# Patient Record
Sex: Female | Born: 1940 | Race: White | Hispanic: No | Marital: Married | Smoking: Never smoker
Health system: Southern US, Community
[De-identification: ages and names within clinical notes are randomized; demographics above are authoritative.]

## PROBLEM LIST (undated history)

## (undated) DIAGNOSIS — R112 Nausea with vomiting, unspecified: Secondary | ICD-10-CM

## (undated) DIAGNOSIS — Z86711 Personal history of pulmonary embolism: Secondary | ICD-10-CM

## (undated) DIAGNOSIS — D509 Iron deficiency anemia, unspecified: Secondary | ICD-10-CM

## (undated) DIAGNOSIS — I10 Essential (primary) hypertension: Secondary | ICD-10-CM

## (undated) DIAGNOSIS — D649 Anemia, unspecified: Secondary | ICD-10-CM

## (undated) DIAGNOSIS — K449 Diaphragmatic hernia without obstruction or gangrene: Secondary | ICD-10-CM

## (undated) DIAGNOSIS — K5790 Diverticulosis of intestine, part unspecified, without perforation or abscess without bleeding: Secondary | ICD-10-CM

## (undated) DIAGNOSIS — E7800 Pure hypercholesterolemia, unspecified: Secondary | ICD-10-CM

## (undated) DIAGNOSIS — M797 Fibromyalgia: Secondary | ICD-10-CM

## (undated) DIAGNOSIS — E559 Vitamin D deficiency, unspecified: Secondary | ICD-10-CM

## (undated) DIAGNOSIS — Z79810 Long term (current) use of selective estrogen receptor modulators (SERMs): Secondary | ICD-10-CM

## (undated) DIAGNOSIS — K219 Gastro-esophageal reflux disease without esophagitis: Secondary | ICD-10-CM

## (undated) DIAGNOSIS — T4145XA Adverse effect of unspecified anesthetic, initial encounter: Secondary | ICD-10-CM

## (undated) DIAGNOSIS — Z973 Presence of spectacles and contact lenses: Secondary | ICD-10-CM

## (undated) DIAGNOSIS — R0600 Dyspnea, unspecified: Secondary | ICD-10-CM

## (undated) DIAGNOSIS — C50919 Malignant neoplasm of unspecified site of unspecified female breast: Secondary | ICD-10-CM

## (undated) DIAGNOSIS — Z9889 Other specified postprocedural states: Secondary | ICD-10-CM

## (undated) DIAGNOSIS — Z923 Personal history of irradiation: Secondary | ICD-10-CM

## (undated) HISTORY — PX: RECONSTRUCTION OF NOSE: SHX2301

## (undated) HISTORY — DX: Fibromyalgia: M79.7

## (undated) HISTORY — PX: OTHER SURGICAL HISTORY: SHX169

## (undated) HISTORY — PX: TUBAL LIGATION: SHX77

## (undated) HISTORY — PX: APPENDECTOMY: SHX54

## (undated) HISTORY — DX: Vitamin D deficiency, unspecified: E55.9

## (undated) HISTORY — DX: Pure hypercholesterolemia, unspecified: E78.00

## (undated) HISTORY — PX: TONSILLECTOMY: SUR1361

## (undated) HISTORY — DX: Diaphragmatic hernia without obstruction or gangrene: K44.9

## (undated) HISTORY — DX: Malignant neoplasm of unspecified site of unspecified female breast: C50.919

## (undated) HISTORY — DX: Anemia, unspecified: D64.9

## (undated) HISTORY — PX: AUGMENTATION MAMMAPLASTY: SUR837

## (undated) HISTORY — DX: Diverticulosis of intestine, part unspecified, without perforation or abscess without bleeding: K57.90

---

## 1993-06-21 HISTORY — PX: LIPOSUCTION: SHX10

## 2000-08-01 ENCOUNTER — Other Ambulatory Visit: Admission: RE | Admit: 2000-08-01 | Discharge: 2000-08-01 | Payer: Self-pay | Admitting: Obstetrics and Gynecology

## 2001-10-10 ENCOUNTER — Other Ambulatory Visit: Admission: RE | Admit: 2001-10-10 | Discharge: 2001-10-10 | Payer: Self-pay | Admitting: Obstetrics and Gynecology

## 2005-02-15 ENCOUNTER — Ambulatory Visit: Payer: Self-pay | Admitting: Internal Medicine

## 2005-10-20 ENCOUNTER — Emergency Department (HOSPITAL_COMMUNITY): Admission: EM | Admit: 2005-10-20 | Discharge: 2005-10-21 | Payer: Self-pay | Admitting: Emergency Medicine

## 2005-10-24 ENCOUNTER — Inpatient Hospital Stay (HOSPITAL_COMMUNITY): Admission: EM | Admit: 2005-10-24 | Discharge: 2005-10-27 | Payer: Self-pay | Admitting: Emergency Medicine

## 2007-06-22 DIAGNOSIS — T8859XA Other complications of anesthesia, initial encounter: Secondary | ICD-10-CM

## 2007-06-22 HISTORY — DX: Other complications of anesthesia, initial encounter: T88.59XA

## 2007-06-22 HISTORY — PX: FACIAL COSMETIC SURGERY: SHX629

## 2007-12-27 ENCOUNTER — Emergency Department (HOSPITAL_COMMUNITY): Admission: EM | Admit: 2007-12-27 | Discharge: 2007-12-28 | Payer: Self-pay | Admitting: Emergency Medicine

## 2010-11-06 NOTE — Discharge Summary (Signed)
[name], [name]            ACCOUNT NO.:  [id number]   MEDICAL RECORD NO.:  [id number]          PATIENT TYPE:  INP   LOCATION:  4741                         FACILITY:  [facility]   PHYSICIAN:  Hillery Aldo, M.D.   DATE OF BIRTH:  09/15/1940   DATE OF ADMISSION:  10/24/2005  DATE OF DISCHARGE:  10/27/2005                                 DISCHARGE SUMMARY   PRIMARY CARE PHYSICIAN:  Dr. Artis Flock.   PSYCHIATRIST:  Dr. Jacqulyn Bath at [facility].   DISCHARGE DIAGNOSES:  1.  Psychotic disorder not otherwise specified.  2.  Major depressive disorder, single episode, severe with psychotic      features.  3.  Hypertension.  4.  Urinary tract infection.  5.  Insomnia.   DISCHARGE MEDICATIONS:  1.  Lexapro 10 mg daily.  2.  Seroquel 50 mg q.h.s.  3.  Micardis 40 mg daily.   CONSULTATIONS:  Dr. Jeanie Sewer of psychiatry.   BRIEF ADMISSION HPI:  The patient is a 70 year old female who presented to  the emergency department with abnormal behavior including auditory  hallucinations as well as paranoid delusions.  She has been under some  stress lately.  She recently saw her primary care physician who put her on  trazodone for insomnia and Zoloft for depression.  She then developed some  pressured speech alternating between speaking to fast and then not answering  questions.  Her attention has been poor.  There has been some anxiety.  She  was admitted for further evaluation and workup.   PROCEDURES AND DIAGNOSTIC STUDIES:  1.  Chest x-ray on Oct 24, 2005 showed no acute cardiopulmonary disease.  2.  MRI of the brain on Oct 24, 2005 showed mild chronic small vessel changes      with no evidence of acute or reversible process.  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  The radiologist did feel that it was insignificant      lesion and did not  recommend any further evaluation.   DISCHARGE LABORATORY VALUES:  Urine cultures are pending.  Sodium is 142,  potassium 3.6, chloride 108, bicarb 29, BUN 7, creatinine 0.8, glucose 98.  White blood cell count was 6, hemoglobin 13.5, hematocrit 39.9, platelets  212.  TSH was 1.567.  Urine drug screen and alcohol screen were negative.   HOSPITAL COURSE:  Problem 1:  PSYCHOSIS:  The patient was admitted and a  workup was undertaken.  Psychiatry was consulted and the patient was  evaluated by Dr. Jeanie Sewer who initiated treatment with Seroquel and  Lexapro.  The patient seemed to do well on this medication and slept well  after it was started.  Twenty-four hours after the medication was started,  she had decreased and delusional thoughts, decreased anxiety and improve  sleep.  Her thought processes were deemed to be less fragmented.  She will  be discharged on Seroquel and Lexapro and an outpatient  appointment has been  made for her to follow up with Dr. Jacqulyn Bath of [facility] at  1:35 on Nov 04, 2005.   Problem 2:  HYPERTENSION:  The patient's blood pressure remained well-  controlled throughout the course of her hospitalization on her usual home  dose of Micardis.   Problem 3:  URINARY TRACT INFECTION:  The patient did have evidence of a  urinary tract infection with complaints of dysuria.  Her urinalysis did show  pyuria and some bacteriuria.  She was treated with Rocephin.  No further  treatment is necessary unless she redevelops symptoms of dysuria.   DISPOSITION:  The patient is stable for discharge home.  She will follow up  with her primary care physician, Dr. Artis Flock, in 1-2 weeks.  She will follow  up with Dr. Jacqulyn Bath of psychiatry for medication management on Nov 04, 2005 as  previously described.           ______________________________  Hillery Aldo, M.D.     CR/MEDQ  D:  10/27/2005  T:  10/28/2005  Job:  [id number]   cc:   Quita Skye. Artis Flock, M.D.  Fax:  [phone]   Long, M.D.  [facility]

## 2010-11-06 NOTE — H&P (Signed)
[name], [name]            ACCOUNT NO.:  [id number]   MEDICAL RECORD NO.:  [id number]          PATIENT TYPE:  INP   LOCATION:  4741                         FACILITY:  [facility]   PHYSICIAN:  Renato Battles, M.D.     DATE OF BIRTH:  [birth year]   DATE OF ADMISSION:  10/24/2005  DATE OF DISCHARGE:                                HISTORY & PHYSICAL   REASON FOR ADMISSION:  Abnormal behavior.   HISTORY OF PRESENT ILLNESS:  The patient is a pleasant 70 year old white  female who woke up this morning feeling that her blood was on fire after 2  hours of sleep, she was also reporting hearing things and feelings things  that sounded strange to her husband who got worried and brought her to the  emergency department for evaluation and management.  She was in the  emergency department for some similar complaints 3 days ago.  At that time  she was thought to be dehydrated, given some IV fluids and discharged.  She  went and saw her primary care physician, Dr. Artis Flock and was started on  trazodone and Zoloft by him.  Her husband reports that she has had multiple  symptoms mostly related to strange behavior including switching back and  forth between talking too fast and not answering questions, hearing things  and talking off the wall.  The patient initially was not answering  questions in the interview then she started talking changing pace quickly  between subjects but she was answering questions appropriately.  She  expressed excessive anxiety over losing her jobs given that is their source  of income for the family and her inability to sleep and her concern that any  psychiatric evaluation would affect her chances of keeping her job.   REVIEW OF SYSTEMS:  CONSTITUTIONAL SYSTEMS:  No fevers, chills or night  sweats.  No weight changes.  GI:  No nausea or vomiting.  Positive for  diarrhea.  GU:  No dysuria or hematuria or retention.  CARDIOPULMONARY:  No  chest pain, no shortness of breath or  orthopnea, no PND.  NEUROLOGICAL:  Positive for throbbing headache in the back of neck and insomnia.   PAST MEDICAL HISTORY:  Hypertension.   PAST SURGICAL HISTORY:  1.  Chin implant.  2.  Breast implant.  3.  Appendectomy.  4.  Tubal ligation.  5.  Liposuction for the arms and abdomen.  6.  Nose reconstruction as a result of traumatic accident.   FAMILY HISTORY:  Noncontributory.   SOCIAL HISTORY:  No tobacco, alcohol or drugs.  She lives with her husband  who is disabled.   DRUG ALLERGIES:  NO KNOWN DRUG ALLERGIES.   HOME MEDICATIONS:  1.  Ambien CR unknown dose.  2.  Micardis 40 mg p.o. daily.  3.  Zoloft 50 mg p.o. daily.  4.  Trazodone 50 mg p.o. q.h.s.   PHYSICAL EXAMINATION:  GENERAL:  The patient is alert and oriented x3,  although she was slow to answer and at times she was inappropriate and  seemed to be attempting to be evasive.  VITALS:  Temperature 98.6,  heart rate 92 down from 103 when she first came  to the ER, respiratory rate 18 and blood pressure 157/65, O2 saturation 95%.  HEENT:  The head is atraumatic, normocephalic.  Pupils were a little dilated  but they are round and reactive to light and accommodation.  Extraocular  movements intact bilaterally.  NECK:  No lymphadenopathy, no thyromegaly, no JVD.  CHEST:  Clear to auscultation bilaterally.  No wheezing, rales or rhonchi.  HEART:  Regular rhythm, tachycardia.  No murmurs.  ABDOMEN:  Soft, not tender, not distended, normoactive bowel sounds.  EXTREMITIES:  No cyanosis, edema or clubbing.  NEUROLOGIC:  Neurologic exam was grossly intact with no hyperreflexia or  hypertonia.   IMPRESSION AND PLAN:  1.  Abnormal behavior.  At this time the patient's behavior is suggestive of      multiple different processes; bipolar disease versus depression,      psychosis and even possibly serotonin syndrome.  Going to admit the      patient for further evaluation and management which was going to be      mostly  supportive with intravenous fluids and Ativan.  Psychiatric      consult will be obtained.  Zoloft and trazodone will be stopped.  Drug      abuse will be ruled out with urine drug screen.  TSH will be checked to      rule out hyperthyroidism.  2.  Hypertension.  Continue with Micardis.      Renato Battles, M.D.  Electronically Signed     SA/MEDQ  D:  10/24/2005  T:  10/24/2005  Job:  [id number]   cc:   Quita Skye. Artis Flock, M.D.  Fax: [phone]

## 2011-03-18 LAB — DIFFERENTIAL
Basophils Absolute: 0.1
Basophils Relative: 1
Eosinophils Relative: 0
Lymphocytes Relative: 6 — ABNORMAL LOW
Lymphs Abs: 0.5 — ABNORMAL LOW
Monocytes Relative: 1 — ABNORMAL LOW
Neutro Abs: 6.8
Neutrophils Relative %: 92 — ABNORMAL HIGH

## 2011-03-18 LAB — URINE MICROSCOPIC-ADD ON

## 2011-03-18 LAB — COMPREHENSIVE METABOLIC PANEL
ALT: 14
AST: 20
GFR calc Af Amer: >60
GFR calc non Af Amer: >60
Glucose, Bld: 164 — ABNORMAL HIGH
Potassium: 3.8
Total Bilirubin: 1.3 — ABNORMAL HIGH
Total Protein: 6.3

## 2011-03-18 LAB — RAPID URINE DRUG SCREEN, HOSP PERFORMED
Benzodiazepines: NOT DETECTED
Opiates: NOT DETECTED

## 2011-03-18 LAB — URINALYSIS, ROUTINE W REFLEX MICROSCOPIC
Protein, ur: NEGATIVE
Specific Gravity, Urine: 1.022

## 2011-03-18 LAB — TRICYCLICS SCREEN, URINE: TCA Scrn: NOT DETECTED

## 2011-03-18 LAB — CBC
MCHC: 34.3
MCV: 88.2
RBC: 5.22 — ABNORMAL HIGH
RDW: 13.3

## 2011-03-18 LAB — ACETAMINOPHEN LEVEL: Acetaminophen (Tylenol), Serum: <10 — ABNORMAL LOW

## 2013-06-11 ENCOUNTER — Encounter: Payer: Self-pay | Admitting: *Deleted

## 2013-06-25 ENCOUNTER — Encounter: Payer: Self-pay | Admitting: Internal Medicine

## 2013-06-25 ENCOUNTER — Other Ambulatory Visit (INDEPENDENT_AMBULATORY_CARE_PROVIDER_SITE_OTHER): Payer: Medicare Other

## 2013-06-25 ENCOUNTER — Ambulatory Visit (INDEPENDENT_AMBULATORY_CARE_PROVIDER_SITE_OTHER): Payer: Medicare Other | Admitting: Internal Medicine

## 2013-06-25 VITALS — BP 140/80 | HR 70 | Ht 65.0 in | Wt 205.4 lb

## 2013-06-25 DIAGNOSIS — R195 Other fecal abnormalities: Secondary | ICD-10-CM

## 2013-06-25 DIAGNOSIS — D649 Anemia, unspecified: Secondary | ICD-10-CM

## 2013-06-25 LAB — VITAMIN B12: Vitamin B-12: 529 pg/mL (ref 211–911)

## 2013-06-25 MED ORDER — PREPOPIK 10-3.5-12 MG-GM-GM PO PACK: 1.0000 | PACK | ORAL | Status: DC

## 2013-06-25 NOTE — Patient Instructions (Addendum)
You have been scheduled for an endoscopy and colonoscopy with propofol. Please follow the written instructions given to you at your visit today. Please pick up your prep at the pharmacy within the next 1-3 days. If you use inhalers (even only as needed), please bring them with you on the day of your procedure. Your physician has requested that you go to www.startemmi.com and enter the access code given to you at your visit today. This web site gives a general overview about your procedure. However, you should still follow specific instructions given to you by our office regarding your preparation for the procedure.  We have sent the following medications to your pharmacy for you to pick up at your convenience: B12, Celiac Panel  CC :Dr Kelton Pillar

## 2013-06-25 NOTE — Progress Notes (Signed)
[name] 12/10/1940 [id number]   History of Present Illness:  This is a 72-year-old white female with severe microcytic anemia. She has a hemoglobin of 8.0, hematocrit of 26.7, MCV of 64 and iron saturation of 2%. She has no specific GI symptoms other than occasional gastroesophageal reflux. She has been postmenopausal for 20 years. Her last hemoglobin in my records was from 2009 at which time she had a Hemoglobin of 15.8, hematocrit of 46.1. She has a history of depression for which she was hospitalized in 2007. She also has a history of seizures. She has never had an endoscopy or colonoscopy. She takes ibuprofen 400 mg 2-3 times a week. There is no family history of colon cancer or inflammatory bowel disease. She has had hemorrhoids since the birth of her first child 51 years ago.    Past Medical History  Diagnosis Date  . Essential hypertension   . Hypercholesterolemia   . Vitamin D deficiency   . Fibromyalgia   . Insomnia   . Anemia   . Depression     related to fibromyalgia    Past Surgical History  Procedure Laterality Date  . Appendectomy    . Breast enhancement surgery    . Tubal ligation      No Known Allergies  Family history and social history have been reviewed.  Review of Systems: Denies dysphagia. Positive for heartburn. Negative for abdominal pain or change in bowel habits  The remainder of the 10 point ROS is negative except as outlined in the H&P  Physical Exam: General Appearance Well developed, in no distress, mildly overweight Eyes  Non icteric  HEENT  Non traumatic, normocephalic  Mouth No lesion, tongue papillated, no cheilosis Neck Supple without adenopathy, thyroid not enlarged, no carotid bruits, no JVD Lungs Clear to auscultation bilaterally COR Normal S1, normal S2, regular rhythm, no murmur, quiet precordium Abdomen soft mildly obese. Nontender. Normoactive bowel sounds. No fullness or mass Rectal soft Hemoccult positive  stool Extremities  No pedal edema Skin No lesions Neurological Alert and oriented x 3 Psychological Normal mood and affect  Assessment and Plan:   Problem #1 heme positive stool and profound iron deficiency anemia. She is postmenopausal. We need to rule out occult GI blood loss from either cancer, a polyp, vascular lesion or peptic ulcer. She will be scheduled for an upper endoscopy as well as colonoscopy. We will also check a sprue profile to rule out malabsorption. I have discussed both procedures as well as conscious sedation with her and we have discussed the prep. We will hold off on starting iron supplements until after her colonoscopy since oral iron interferes with the prep.    Arnold Depinto 06/25/2013      

## 2013-06-26 ENCOUNTER — Encounter: Payer: Self-pay | Admitting: Internal Medicine

## 2013-06-26 LAB — CELIAC PANEL 10
Endomysial Screen: NEGATIVE
Gliadin IgA: 2 U/mL (ref <20)
Gliadin IgG: 4 U/mL (ref <20)
IgA: 119 mg/dL (ref 69–380)
Tissue Transglut Ab: 5.2 U/mL (ref <20)
Tissue Transglutaminase Ab, IgA: 2.6 U/mL (ref <20)

## 2013-07-05 ENCOUNTER — Ambulatory Visit (HOSPITAL_COMMUNITY)
Admission: RE | Admit: 2013-07-05 | Discharge: 2013-07-05 | Disposition: A | Discharge status: Home / Self Care | Payer: Medicare Other | Source: Ambulatory Visit | Attending: Internal Medicine | Admitting: Internal Medicine

## 2013-07-05 ENCOUNTER — Encounter (HOSPITAL_COMMUNITY)
Admission: RE | Disposition: A | Discharge status: Home / Self Care | Payer: Self-pay | Source: Ambulatory Visit | Attending: Internal Medicine

## 2013-07-05 ENCOUNTER — Encounter (HOSPITAL_COMMUNITY): Payer: Self-pay

## 2013-07-05 DIAGNOSIS — K219 Gastro-esophageal reflux disease without esophagitis: Secondary | ICD-10-CM | POA: Insufficient documentation

## 2013-07-05 DIAGNOSIS — K5730 Diverticulosis of large intestine without perforation or abscess without bleeding: Secondary | ICD-10-CM | POA: Insufficient documentation

## 2013-07-05 DIAGNOSIS — K449 Diaphragmatic hernia without obstruction or gangrene: Secondary | ICD-10-CM | POA: Insufficient documentation

## 2013-07-05 DIAGNOSIS — Q438 Other specified congenital malformations of intestine: Secondary | ICD-10-CM | POA: Insufficient documentation

## 2013-07-05 DIAGNOSIS — Z780 Asymptomatic menopausal state: Secondary | ICD-10-CM | POA: Insufficient documentation

## 2013-07-05 DIAGNOSIS — E559 Vitamin D deficiency, unspecified: Secondary | ICD-10-CM | POA: Insufficient documentation

## 2013-07-05 DIAGNOSIS — D509 Iron deficiency anemia, unspecified: Secondary | ICD-10-CM | POA: Insufficient documentation

## 2013-07-05 DIAGNOSIS — I10 Essential (primary) hypertension: Secondary | ICD-10-CM | POA: Insufficient documentation

## 2013-07-05 DIAGNOSIS — IMO0001 Reserved for inherently not codable concepts without codable children: Secondary | ICD-10-CM | POA: Insufficient documentation

## 2013-07-05 DIAGNOSIS — R569 Unspecified convulsions: Secondary | ICD-10-CM | POA: Insufficient documentation

## 2013-07-05 DIAGNOSIS — E7800 Pure hypercholesterolemia, unspecified: Secondary | ICD-10-CM | POA: Insufficient documentation

## 2013-07-05 DIAGNOSIS — F3289 Other specified depressive episodes: Secondary | ICD-10-CM | POA: Insufficient documentation

## 2013-07-05 DIAGNOSIS — K222 Esophageal obstruction: Secondary | ICD-10-CM | POA: Insufficient documentation

## 2013-07-05 DIAGNOSIS — Z9089 Acquired absence of other organs: Secondary | ICD-10-CM | POA: Insufficient documentation

## 2013-07-05 DIAGNOSIS — D649 Anemia, unspecified: Secondary | ICD-10-CM

## 2013-07-05 DIAGNOSIS — F329 Major depressive disorder, single episode, unspecified: Secondary | ICD-10-CM | POA: Insufficient documentation

## 2013-07-05 HISTORY — PX: COLONOSCOPY: SHX5424

## 2013-07-05 HISTORY — DX: Adverse effect of unspecified anesthetic, initial encounter: T41.45XA

## 2013-07-05 HISTORY — PX: ESOPHAGOGASTRODUODENOSCOPY: SHX5428

## 2013-07-05 SURGERY — EGD (ESOPHAGOGASTRODUODENOSCOPY)
Anesthesia: Moderate Sedation

## 2013-07-05 MED ORDER — BUTAMBEN-TETRACAINE-BENZOCAINE 2-2-14 % EX AERO
INHALATION_SPRAY | CUTANEOUS | Status: DC | PRN
  Administered 2013-07-05: 2 via TOPICAL

## 2013-07-05 MED ORDER — RANITIDINE HCL 150 MG PO TABS: 150.0000 mg | ORAL_TABLET | Freq: Every day | ORAL | Status: DC

## 2013-07-05 MED ORDER — FENTANYL CITRATE 0.05 MG/ML IJ SOLN
INTRAMUSCULAR | Status: DC | PRN
  Administered 2013-07-05 (×4): 25 ug via INTRAVENOUS

## 2013-07-05 MED ORDER — MIDAZOLAM HCL 10 MG/2ML IJ SOLN
INTRAMUSCULAR | Status: DC | PRN
  Administered 2013-07-05 (×3): 2 mg via INTRAVENOUS
  Administered 2013-07-05: 1 mg via INTRAVENOUS
  Administered 2013-07-05: 2 mg via INTRAVENOUS

## 2013-07-05 MED ORDER — FENTANYL CITRATE 0.05 MG/ML IJ SOLN
INTRAMUSCULAR | Status: AC
  Filled 2013-07-05: qty 2

## 2013-07-05 MED ORDER — MIDAZOLAM HCL 10 MG/2ML IJ SOLN
INTRAMUSCULAR | Status: AC
  Filled 2013-07-05: qty 4

## 2013-07-05 MED ORDER — SODIUM CHLORIDE 0.9 % IV SOLN
INTRAVENOUS | Status: DC
  Administered 2013-07-05: 11:00:00 via INTRAVENOUS

## 2013-07-05 NOTE — Interval H&P Note (Signed)
History and Physical Interval Note:  07/05/2013 7:51 AM  [name]  has presented today for surgery, with the diagnosis of anemia  The various methods of treatment have been discussed with the patient and family. After consideration of risks, benefits and other options for treatment, the patient has consented to  Procedure(s): ESOPHAGOGASTRODUODENOSCOPY (EGD) (N/A) COLONOSCOPY (N/A) as a surgical intervention .  The patient's history has been reviewed, patient examined, no change in status, stable for surgery.  I have reviewed the patient's chart and labs.  Questions were answered to the patient's satisfaction.     Delfin Edis

## 2013-07-05 NOTE — Op Note (Addendum)
[facility] [place], [address]   ENDOSCOPY PROCEDURE REPORT  PATIENT: [name], [name]  MR#: [id number] BIRTHDATE: 1941/06/04 , 73  yrs. old GENDER: Female ENDOSCOPIST: Lafayette Dragon, MD REFERRED BY:  Kelton Pillar, M.D. PROCEDURE DATE:  07/05/2013 PROCEDURE:  EGD w/ biopsy ASA CLASS:     Class II INDICATIONS:  Iron deficiency anemia.   Heme positive, MCV 64, Hgb 8.6. MEDICATIONS: These medications were titrated to patient response per physician's verbal order, Fentanyl 50 mcg IV, and Versed 5 mg IV TOPICAL ANESTHETIC: Cetacaine Spray  DESCRIPTION OF PROCEDURE: After the risks benefits and alternatives of the procedure were thoroughly explained, informed consent was obtained.  The Pentax Gastroscope [id number] endoscope was introduced through the mouth and advanced to the second portion of the duodenum. Without limitations.  The instrument was slowly withdrawn as the mucosa was fully examined.        ESOPHAGUS: A 4 cm non reducible  hiatal hernia was noted.extending from 33 to 37 cm,   A medium sized hiatal hernia was noted.   The mucosa of the esophagus appeared normal.   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  STOMACH: The mucosa of the stomach appeared normal.   Retroflexion confirmed presence of hiatal hernia  DUODENUM: The duodenal mucosa showed no abnormalities in the bulb and second portion of the duodenum.  Cold forcep biopsies were taken in the second portion.  R.The scope was then withdrawn from the patient and the procedure completed.  COMPLICATIONS: There were no complications. ENDOSCOPIC IMPRESSION: 1.   4 cm hiatal hernia , 33-37 cm, nonreducible 3.   The mucosa of the esophagus appeared normal 4.   Stricture was found at the gastroesophageal junction , non obstructing,no esophagitis 5.   The mucosa of the stomach  appeared normal 6.   The duodenal mucosa showed no abnormalities in the bulb and second portion of the duodenum , biopsies taken to r/o villous atrophy  RECOMMENDATIONS: 1.  Await biopsy results 2.  Anti-reflux regimen to be follow 3.  Proceed with a Colonoscopy. 4. Ranitidine 150 mg po qhs, restart Iron supplements  REPEAT EXAM:    no  eSigned:  Lafayette Dragon, MD 07/05/2013 1:24 PM Revised: 07/05/2013 1:24 PM  CC:  PATIENT NAME:  Etoy, Mcdonnell MR#: [id number]

## 2013-07-05 NOTE — Op Note (Addendum)
[facility] [place], [address]   COLONOSCOPY PROCEDURE REPORT  PATIENT: [name], [name]  MR#: [id number] BIRTHDATE: 03-28-1941 , 65  yrs. old GENDER: Female ENDOSCOPIST: Lafayette Dragon, MD REFERRED KZ:[id number] Laurann Montana, M.D. PROCEDURE DATE:  07/05/2013 PROCEDURE:   Colonoscopy, diagnostic First Screening Colonoscopy - Avg.  risk and is 50 yrs.  old or older Yes.  Prior Negative Screening - Now for repeat screening. N/A  History of Adenoma - Now for follow-up colonoscopy & has been > or = to 3 yrs.  N/A  Polyps Removed Today? No.  Recommend repeat exam, <10 yrs? No. ASA CLASS:   Class II INDICATIONS:Iron Deficiency Anemia and Hgb 8.6, postmenopausal, MCV 64, heme positive on physical exam. MEDICATIONS: These medications were titrated to patient response per physician's verbal order, Fentanyl 50 mcg IV, and Versed 4 mg IV  DESCRIPTION OF PROCEDURE:   After the risks benefits and alternatives of the procedure were thoroughly explained, informed consent was obtained.  A digital rectal exam revealed no abnormalities of the rectum.   The Pentax Ped Colon [id number] endoscope was introduced through the anus and advanced to the cecum, which was identified by both the appendix and ileocecal valve. No adverse events experienced.   The quality of the prep was excellent, using MoviPrep  The instrument was then slowly withdrawn as the colon was fully examined.      COLON FINDINGS: There was moderate diverticulosis noted in the sigmoid colon with associated tortuosity, muscular hypertrophy and angulation.  Retroflexed views revealed no abnormalities. The time to cecum=5 minutes 0 seconds.  Withdrawal time=7 minutes 0 seconds. The scope was withdrawn and the procedure completed. COMPLICATIONS: There were no complications.  ENDOSCOPIC IMPRESSION: There was moderate diverticulosis noted in the sigmoid colon , nothing to account for severe iron deficiency  anemia Internal hemorrhoids, not bleeding RECOMMENDATIONS:  high fiber diet resume Iron supplements, keep rechecking H/H over next few months, if anemia persists then she will need a small bowl capsule endoscopy to r/o small bowl source of bleeding recall colonoscopy 10 years   eSigned:  Lafayette Dragon, MD 07/05/2013 1:23 PM Revised: 07/05/2013 1:23 PM  cc:   PATIENT NAME:  Leighanna, Kirn MR#: [id number]

## 2013-07-05 NOTE — H&P (View-Only) (Signed)
[name] 13-Aug-1940 [id number]   History of Present Illness:  This is a 73 year old white female with severe microcytic anemia. She has a hemoglobin of 8.0, hematocrit of 26.7, MCV of 64 and iron saturation of 2%. She has no specific GI symptoms other than occasional gastroesophageal reflux. She has been postmenopausal for 20 years. Her last hemoglobin in my records was from 2009 at which time she had a Hemoglobin of 15.8, hematocrit of 46.1. She has a history of depression for which she was hospitalized in 2007. She also has a history of seizures. She has never had an endoscopy or colonoscopy. She takes ibuprofen 400 mg 2-3 times a week. There is no family history of colon cancer or inflammatory bowel disease. She has had hemorrhoids since the birth of her first child 31 years ago.    Past Medical History  Diagnosis Date  . Essential hypertension   . Hypercholesterolemia   . Vitamin D deficiency   . Fibromyalgia   . Insomnia   . Anemia   . Depression     related to fibromyalgia    Past Surgical History  Procedure Laterality Date  . Appendectomy    . Breast enhancement surgery    . Tubal ligation      No Known Allergies  Family history and social history have been reviewed.  Review of Systems: Denies dysphagia. Positive for heartburn. Negative for abdominal pain or change in bowel habits  The remainder of the 10 point ROS is negative except as outlined in the H&P  Physical Exam: General Appearance Well developed, in no distress, mildly overweight Eyes  Non icteric  HEENT  Non traumatic, normocephalic  Mouth No lesion, tongue papillated, no cheilosis Neck Supple without adenopathy, thyroid not enlarged, no carotid bruits, no JVD Lungs Clear to auscultation bilaterally COR Normal S1, normal S2, regular rhythm, no murmur, quiet precordium Abdomen soft mildly obese. Nontender. Normoactive bowel sounds. No fullness or mass Rectal soft Hemoccult positive  stool Extremities  No pedal edema Skin No lesions Neurological Alert and oriented x 3 Psychological Normal mood and affect  Assessment and Plan:   Problem #1 heme positive stool and profound iron deficiency anemia. She is postmenopausal. We need to rule out occult GI blood loss from either cancer, a polyp, vascular lesion or peptic ulcer. She will be scheduled for an upper endoscopy as well as colonoscopy. We will also check a sprue profile to rule out malabsorption. I have discussed both procedures as well as conscious sedation with her and we have discussed the prep. We will hold off on starting iron supplements until after her colonoscopy since oral iron interferes with the prep.    Delfin Edis 06/25/2013

## 2013-07-05 NOTE — Discharge Instructions (Signed)
Gastrointestinal Endoscopy °Care After °Refer to this sheet in the next few weeks. These instructions provide you with information on caring for yourself after your procedure. Your caregiver may also give you more specific instructions. Your treatment has been planned according to current medical practices, but problems sometimes occur. Call your caregiver if you have any problems or questions after your procedure. °HOME CARE INSTRUCTIONS °· If you were given medicine to help you relax (sedative), do not drive, operate machinery, or sign important documents for 24 hours. °· Avoid alcohol and hot or warm beverages for the first 24 hours after the procedure. °· Only take over-the-counter or prescription medicines for pain, discomfort, or fever as directed by your caregiver. You may resume taking your normal medicines unless your caregiver tells you otherwise. Ask your caregiver when you may resume taking medicines that may cause bleeding, such as aspirin, clopidogrel, or warfarin. °· You may return to your normal diet and activities on the day after your procedure, or as directed by your caregiver. Walking may help to reduce any bloated feeling in your abdomen. °· Drink enough fluids to keep your urine clear or pale yellow. °· You may gargle with salt water if you have a sore throat. °SEEK IMMEDIATE MEDICAL CARE IF: °· You have severe nausea or vomiting. °· You have severe abdominal pain, abdominal cramps that last longer than 6 hours, or abdominal swelling (distention). °· You have severe shoulder or back pain. °· You have trouble swallowing. °· You have shortness of breath, your breathing is shallow, or you are breathing faster than normal. °· You have a fever or a rapid heartbeat. °· You vomit blood or material that looks like coffee grounds. °· You have bloody, black, or tarry stools. °MAKE SURE YOU: °· Understand these instructions. °· Will watch your condition. °· Will get help right away if you are not doing  well or get worse. °Document Released: 01/20/2004 Document Revised: 12/07/2011 Document Reviewed: 09/07/2011 °[employer]® Patient Information ©2014 [employer]. °Colonoscopy °Care After °These instructions give you information on caring for yourself after your procedure. Your doctor may also give you more specific instructions. Call your doctor if you have any problems or questions after your procedure. °HOME CARE °· Take it easy for the next 24 hours. °· Rest. °· Walk or use warm packs on your belly (abdomen) if you have belly cramping or gas. °· Do not drive for 24 hours. °· You may shower. °· Do not sign important papers or use machinery for 24 hours. °· Drink enough fluids to keep your pee (urine) clear or pale yellow. °· Resume your normal diet. Avoid heavy or fried foods. °· Avoid alcohol. °· Continue taking your normal medicines. °· Only take medicine as told by your doctor. Do not take aspirin. °If you had growths (polyps) removed: °· Do not take aspirin. °· Do not drink alcohol for 7 days or as told by your doctor. °· Eat a soft diet for 24 hours. °GET HELP RIGHT AWAY IF: °· You have a fever. °· You pass clumps of tissue (blood clots) or fill the toilet with blood. °· You have belly pain that gets worse and medicine does not help. °· Your belly is puffy (swollen). °· You feel sick to your stomach (nauseous) or throw up (vomit). °MAKE SURE YOU: °· Understand these instructions. °· Will watch your condition. °· Will get help right away if you are not doing well or get worse. °Document Released: 07/10/2010 Document Revised: 08/30/2011 Document Reviewed:   02/12/2013 °ExitCare® Patient Information ©2014 [employer]. ° °

## 2013-07-06 ENCOUNTER — Encounter: Payer: Self-pay | Admitting: Internal Medicine

## 2013-07-06 ENCOUNTER — Encounter (HOSPITAL_COMMUNITY): Payer: Self-pay | Admitting: Internal Medicine

## 2013-07-12 ENCOUNTER — Other Ambulatory Visit: Payer: Self-pay | Admitting: Radiology

## 2013-07-13 ENCOUNTER — Other Ambulatory Visit: Payer: Self-pay | Admitting: Radiology

## 2013-07-13 DIAGNOSIS — C50919 Malignant neoplasm of unspecified site of unspecified female breast: Secondary | ICD-10-CM

## 2013-07-19 ENCOUNTER — Encounter (INDEPENDENT_AMBULATORY_CARE_PROVIDER_SITE_OTHER): Payer: Self-pay | Admitting: Surgery

## 2013-07-19 ENCOUNTER — Ambulatory Visit (INDEPENDENT_AMBULATORY_CARE_PROVIDER_SITE_OTHER): Payer: Medicare Other | Admitting: Surgery

## 2013-07-19 ENCOUNTER — Encounter (HOSPITAL_COMMUNITY): Payer: Self-pay | Admitting: Pharmacy Technician

## 2013-07-19 VITALS — BP 138/68 | HR 100 | Resp 14 | Ht 65.0 in | Wt 205.6 lb

## 2013-07-19 DIAGNOSIS — C50511 Malignant neoplasm of lower-outer quadrant of right female breast: Secondary | ICD-10-CM | POA: Insufficient documentation

## 2013-07-19 DIAGNOSIS — C50211 Malignant neoplasm of upper-inner quadrant of right female breast: Secondary | ICD-10-CM

## 2013-07-19 DIAGNOSIS — C50911 Malignant neoplasm of unspecified site of right female breast: Secondary | ICD-10-CM

## 2013-07-19 DIAGNOSIS — C50219 Malignant neoplasm of upper-inner quadrant of unspecified female breast: Secondary | ICD-10-CM

## 2013-07-19 DIAGNOSIS — C50919 Malignant neoplasm of unspecified site of unspecified female breast: Secondary | ICD-10-CM

## 2013-07-19 DIAGNOSIS — L989 Disorder of the skin and subcutaneous tissue, unspecified: Secondary | ICD-10-CM
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
## 2013-07-20 ENCOUNTER — Ambulatory Visit
Admission: RE | Admit: 2013-07-20 | Discharge: 2013-07-20 | Disposition: A | Discharge status: Home / Self Care | Payer: Medicare Other | Source: Ambulatory Visit | Attending: Radiology | Admitting: Radiology

## 2013-07-20 DIAGNOSIS — C50919 Malignant neoplasm of unspecified site of unspecified female breast: Secondary | ICD-10-CM

## 2013-07-20 MED ORDER — GADOBENATE DIMEGLUMINE 529 MG/ML IV SOLN
19.0000 mL | Freq: Once | INTRAVENOUS | Status: AC | PRN
  Administered 2013-07-20: 19 mL via INTRAVENOUS

## 2013-07-25 ENCOUNTER — Other Ambulatory Visit (HOSPITAL_COMMUNITY): Payer: Self-pay | Admitting: *Deleted

## 2013-07-25 NOTE — Pre-Procedure Instructions (Signed)
Julian Askin  07/25/2013   Your procedure is scheduled on:  Tuesday, July 31, 2013 at 2:30 PM.   Report to [facility] Entrance "A" Admitting Office at 12:30 PM or as soon as you're done at the [facility].   Call this number if you have problems the morning of surgery: [phone]   Remember:   Do not eat food or drink liquids after midnight Monday, 07/30/13.   Take these medicines the morning of surgery with A SIP OF WATER: None  Stop all Vitamins, Krill Oil, Fish Oil as of today, 07/26/13.   Do not wear jewelry, make-up or nail polish.  Do not wear lotions, powders, or perfumes. You may wear deodorant.  Do not shave 48 hours prior to surgery.   Do not bring valuables to the hospital.  [facility] is not responsible                  for any belongings or valuables.               Contacts, dentures or bridgework may not be worn into surgery.  Leave suitcase in the car. After surgery it may be brought to your room.  For patients admitted to the hospital, discharge time is determined by your                treatment team.               Patients discharged the day of surgery will not be allowed to drive  home.  Name and phone number of your driver: Family/friend   Special Instructions: [place] - Preparing for Surgery  Before surgery, you can play an important role.  Because skin is not sterile, your skin needs to be as free of germs as possible.  You can reduce the number of germs on you skin by washing with CHG (chlorahexidine gluconate) soap before surgery.  CHG is an antiseptic cleaner which kills germs and bonds with the skin to continue killing germs even after washing.  Please DO NOT use if you have an allergy to CHG or antibacterial soaps.  If your skin becomes reddened/irritated stop using the CHG and inform your nurse when you arrive at Short Stay.  Do not shave (including legs and underarms) for at least 48 hours prior to the first CHG shower.  You may  shave your face.  Please follow these instructions carefully:   1.  Shower with CHG Soap the night before surgery and the                                morning of Surgery.  2.  If you choose to wash your hair, wash your hair first as usual with your       normal shampoo.  3.  After you shampoo, rinse your hair and body thoroughly to remove the                      Shampoo.  4.  Use CHG as you would any other liquid soap.  You can apply chg directly       to the skin and wash gently with scrungie or a clean washcloth.  5.  Apply the CHG Soap to your body ONLY FROM THE NECK DOWN.        Do not use on open wounds or open sores.  Avoid contact with  your eyes,       ears, mouth and genitals (private parts).  Wash genitals (private parts)       with your normal soap.  6.  Wash thoroughly, paying special attention to the area where your surgery        will be performed.  7.  Thoroughly rinse your body with warm water from the neck down.  8.  DO NOT shower/wash with your normal soap after using and rinsing off       the CHG Soap.  9.  Pat yourself dry with a clean towel.            10.  Wear clean pajamas.            11.  Place clean sheets on your bed the night of your first shower and do not        sleep with pets.  Day of Surgery  Do not apply any lotions/deoderants the morning of surgery.  Please wear clean clothes to the hospital/surgery center.     Please read over the following fact sheets that you were given: Pain Booklet, Coughing and Deep Breathing and Surgical Site Infection Prevention   

## 2013-07-25 NOTE — Pre-Procedure Instructions (Signed)
[name]  07/25/2013   Your procedure is scheduled on:  Tuesday, July 31, 2013 at 2:30 PM.   Report to [facility] Entrance "A" Admitting Office at 12:30 PM or as soon as you're done at the [facility].   Call this number if you have problems the morning of surgery: [phone]   Remember:   Do not eat food or drink liquids after midnight Monday, 07/30/13.   Take these medicines the morning of surgery with A SIP OF WATER: None  Stop all Vitamins, Krill Oil, Fish Oil as of today, 07/26/13.   Do not wear jewelry, make-up or nail polish.  Do not wear lotions, powders, or perfumes. You may wear deodorant.  Do not shave 48 hours prior to surgery.   Do not bring valuables to the hospital.  [facility] is not responsible                  for any belongings or valuables.               Contacts, dentures or bridgework may not be worn into surgery.  Leave suitcase in the car. After surgery it may be brought to your room.  For patients admitted to the hospital, discharge time is determined by your                treatment team.               Patients discharged the day of surgery will not be allowed to drive  home.  Name and phone number of your driver: Family/friend   Special Instructions: [place] - Preparing for Surgery  Before surgery, you can play an important role.  Because skin is not sterile, your skin needs to be as free of germs as possible.  You can reduce the number of germs on you skin by washing with CHG (chlorahexidine gluconate) soap before surgery.  CHG is an antiseptic cleaner which kills germs and bonds with the skin to continue killing germs even after washing.  Please DO NOT use if you have an allergy to CHG or antibacterial soaps.  If your skin becomes reddened/irritated stop using the CHG and inform your nurse when you arrive at Short Stay.  Do not shave (including legs and underarms) for at least 48 hours prior to the first CHG shower.  You may  shave your face.  Please follow these instructions carefully:   1.  Shower with CHG Soap the night before surgery and the                                morning of Surgery.  2.  If you choose to wash your hair, wash your hair first as usual with your       normal shampoo.  3.  After you shampoo, rinse your hair and body thoroughly to remove the                      Shampoo.  4.  Use CHG as you would any other liquid soap.  You can apply chg directly       to the skin and wash gently with scrungie or a clean washcloth.  5.  Apply the CHG Soap to your body ONLY FROM THE NECK DOWN.        Do not use on open wounds or open sores.  Avoid contact with  your eyes,       ears, mouth and genitals (private parts).  Wash genitals (private parts)       with your normal soap.  6.  Wash thoroughly, paying special attention to the area where your surgery        will be performed.  7.  Thoroughly rinse your body with warm water from the neck down.  8.  DO NOT shower/wash with your normal soap after using and rinsing off       the CHG Soap.  9.  Pat yourself dry with a clean towel.            10.  Wear clean pajamas.            11.  Place clean sheets on your bed the night of your first shower and do not        sleep with pets.  Day of Surgery  Do not apply any lotions/deoderants the morning of surgery.  Please wear clean clothes to the hospital/surgery center.     Please read over the following fact sheets that you were given: Pain Booklet, Coughing and Deep Breathing and Surgical Site Infection Prevention

## 2013-07-25 NOTE — Pre-Procedure Instructions (Signed)
[name]  07/25/2013   Your procedure is scheduled on:  Tuesday, July 31, 2013 at 2:30 PM.   Report to [facility] Entrance "A" Admitting Office at 12:30 PM or as soon as you're done at the [facility].   Call this number if you have problems the morning of surgery: [phone]   Remember:   Do not eat food or drink liquids after midnight Monday, 07/30/13.   Take these medicines the morning of surgery with A SIP OF WATER: None  Stop all Vitamins, Krill Oil, Fish Oil as of today, 07/26/13.   Do not wear jewelry, make-up or nail polish.  Do not wear lotions, powders, or perfumes. You may wear deodorant.  Do not shave 48 hours prior to surgery.   Do not bring valuables to the hospital.  [facility] is not responsible                  for any belongings or valuables.               Contacts, dentures or bridgework may not be worn into surgery.  Leave suitcase in the car. After surgery it may be brought to your room.  For patients admitted to the hospital, discharge time is determined by your                treatment team.               Patients discharged the day of surgery will not be allowed to drive  home.  Name and phone number of your driver: Family/friend   Special Instructions:  - Preparing for Surgery  Before surgery, you can play an important role.  Because skin is not sterile, your skin needs to be as free of germs as possible.  You can reduce the number of germs on you skin by washing with CHG (chlorahexidine gluconate) soap before surgery.  CHG is an antiseptic cleaner which kills germs and bonds with the skin to continue killing germs even after washing.  Please DO NOT use if you have an allergy to CHG or antibacterial soaps.  If your skin becomes reddened/irritated stop using the CHG and inform your nurse when you arrive at Short Stay.  Do not shave (including legs and underarms) for at least 48 hours prior to the first CHG shower.  You may  shave your face.  Please follow these instructions carefully:   1.  Shower with CHG Soap the night before surgery and the                                morning of Surgery.  2.  If you choose to wash your hair, wash your hair first as usual with your       normal shampoo.  3.  After you shampoo, rinse your hair and body thoroughly to remove the                      Shampoo.  4.  Use CHG as you would any other liquid soap.  You can apply chg directly       to the skin and wash gently with scrungie or a clean washcloth.  5.  Apply the CHG Soap to your body ONLY FROM THE NECK DOWN.        Do not use on open wounds or open sores.  Avoid contact with   your eyes,       ears, mouth and genitals (private parts).  Wash genitals (private parts)       with your normal soap.  6.  Wash thoroughly, paying special attention to the area where your surgery        will be performed.  7.  Thoroughly rinse your body with warm water from the neck down.  8.  DO NOT shower/wash with your normal soap after using and rinsing off       the CHG Soap.  9.  Pat yourself dry with a clean towel.            10.  Wear clean pajamas.            11.  Place clean sheets on your bed the night of your first shower and do not        sleep with pets.  Day of Surgery  Do not apply any lotions/deoderants the morning of surgery.  Please wear clean clothes to the hospital/surgery center.     Please read over the following fact sheets that you were given: Pain Booklet, Coughing and Deep Breathing and Surgical Site Infection Prevention   

## 2013-07-26 ENCOUNTER — Encounter (HOSPITAL_COMMUNITY)
Admission: RE | Admit: 2013-07-26 | Discharge: 2013-07-26 | Disposition: A | Discharge status: Home / Self Care | Payer: Medicare Other | Source: Ambulatory Visit | Attending: Surgery | Admitting: Surgery

## 2013-07-26 ENCOUNTER — Other Ambulatory Visit (INDEPENDENT_AMBULATORY_CARE_PROVIDER_SITE_OTHER): Payer: Self-pay

## 2013-07-26 ENCOUNTER — Telehealth (INDEPENDENT_AMBULATORY_CARE_PROVIDER_SITE_OTHER): Payer: Self-pay | Admitting: General Surgery

## 2013-07-26 ENCOUNTER — Encounter (HOSPITAL_COMMUNITY): Payer: Self-pay

## 2013-07-26 ENCOUNTER — Encounter (HOSPITAL_COMMUNITY)
Admission: RE | Admit: 2013-07-26 | Discharge: 2013-07-26 | Disposition: A | Discharge status: Home / Self Care | Payer: Medicare Other | Source: Ambulatory Visit | Attending: Anesthesiology | Admitting: Anesthesiology

## 2013-07-26 DIAGNOSIS — D0590 Unspecified type of carcinoma in situ of unspecified breast: Secondary | ICD-10-CM

## 2013-07-26 DIAGNOSIS — C50211 Malignant neoplasm of upper-inner quadrant of right female breast: Secondary | ICD-10-CM

## 2013-07-26 DIAGNOSIS — Z01818 Encounter for other preprocedural examination: Secondary | ICD-10-CM | POA: Insufficient documentation

## 2013-07-26 DIAGNOSIS — Z01810 Encounter for preprocedural cardiovascular examination: Secondary | ICD-10-CM | POA: Insufficient documentation

## 2013-07-26 DIAGNOSIS — Z01812 Encounter for preprocedural laboratory examination: Secondary | ICD-10-CM | POA: Insufficient documentation

## 2013-07-26 HISTORY — DX: Other specified postprocedural states: R11.2

## 2013-07-26 HISTORY — DX: Other specified postprocedural states: Z98.890

## 2013-07-26 HISTORY — DX: Gastro-esophageal reflux disease without esophagitis: K21.9

## 2013-07-26 LAB — CBC
HCT: 26.3 % — ABNORMAL LOW (ref 36.0–46.0)
Hemoglobin: 7.6 g/dL — ABNORMAL LOW (ref 12.0–15.0)
MCH: 19 pg — AB (ref 26.0–34.0)
MCHC: 28.9 g/dL — ABNORMAL LOW (ref 30.0–36.0)
MCV: 65.9 fL — AB (ref 78.0–100.0)
Platelets: 407 10*3/uL — ABNORMAL HIGH (ref 150–400)
RBC: 3.99 MIL/uL (ref 3.87–5.11)
RDW: 20.3 % — AB (ref 11.5–15.5)
WBC: 7.1 10*3/uL (ref 4.0–10.5)

## 2013-07-26 LAB — BASIC METABOLIC PANEL
BUN: 22 mg/dL (ref 6–23)
CO2: 26 meq/L (ref 19–32)
CREATININE: 0.78 mg/dL (ref 0.50–1.10)
Calcium: 9.6 mg/dL (ref 8.4–10.5)
Chloride: 105 mEq/L (ref 96–112)
GFR calc Af Amer: >90 mL/min (ref >90)
GFR calc non Af Amer: 82 mL/min — ABNORMAL LOW (ref >90)
GLUCOSE: 111 mg/dL — AB (ref 70–99)
Potassium: 4.5 mEq/L (ref 3.7–5.3)
Sodium: 144 mEq/L (ref 137–147)

## 2013-07-26 NOTE — Telephone Encounter (Signed)
Pt called to ask if there is any further workup or tests needed before her surgery next Tuesday.  The MRI she just had was unable to determine whether her implants are saline or silicone.  The original records of Dr. Judyann Munson (sic) have been purged, so she was unable to find out that way.  Please advise if she needs to do anything else.

## 2013-07-27 ENCOUNTER — Telehealth (INDEPENDENT_AMBULATORY_CARE_PROVIDER_SITE_OTHER): Payer: Self-pay | Admitting: Surgery

## 2013-07-27 ENCOUNTER — Telehealth (INDEPENDENT_AMBULATORY_CARE_PROVIDER_SITE_OTHER): Payer: Self-pay | Admitting: *Deleted

## 2013-07-27 ENCOUNTER — Ambulatory Visit
Admission: RE | Admit: 2013-07-27 | Discharge: 2013-07-27 | Disposition: A | Discharge status: Home / Self Care | Payer: Medicare Other | Source: Ambulatory Visit | Attending: Surgery | Admitting: Surgery

## 2013-07-27 DIAGNOSIS — D0590 Unspecified type of carcinoma in situ of unspecified breast: Secondary | ICD-10-CM

## 2013-07-27 NOTE — Telephone Encounter (Signed)
We were called for physician to physician insurance review for the MRI to document for the breast MRI whether Ms. [name] implants are ruptured.  I was operating.  Info taken by [employer].  Phone:  [phone] Case#: [place] contact [name] contact Dr. Cletus Gash - reviewing physician Approval:  [id number]  (good for 45 days)

## 2013-07-27 NOTE — Telephone Encounter (Signed)
Per Dr. Lucia Gaskins after physician to physician review with [facility], MRI breast bilateral has been approved with an Auth #: [id number] on 07/27/13, expires on 09/10/13.  [facility] made aware.

## 2013-07-27 NOTE — Progress Notes (Signed)
Anesthesia Chart Review:  Patient is a 73 year old female scheduled for right breast lumpectomy (needle localization), right axillary SN biopsy, biopsy skin right arm on 07/31/13 by Dr. Lucia Gaskins.  History includes right breast cancer, breast implants, HTN, fibromyalgia, anemia with right arm mole, non-smoker, GERD, depression, post-operative N/V. PCP is Dr. Kelton Pillar.   EKG on 07/26/13 showed NSR, low voltage QRS. CXR on 07/26/13 showed no active disease.  Preoperative labs noted. H/H 7.6/26.3 (previously 8.0/26.7 on 06/06/13 [facility] under [facility] tab).  EGD and colonscopy 07/05/13 showed diverticulosis and hiatal hernia, but nothing to account for severe iron deficiency anemia. She is on oral iron. I've routed labs to Dr. Lucia Gaskins. HR was documented at 92-103 bpm during her PAT.  She was not hypotensive. She does not have any reported CAD history.  Will defer additional orders, if any, to Dr. Lucia Gaskins.  George Hugh [facility] Short Stay Center/Anesthesiology Phone [phone] 07/27/2013 1:52 PM

## 2013-07-30 ENCOUNTER — Telehealth: Payer: Self-pay | Admitting: *Deleted

## 2013-07-30 ENCOUNTER — Encounter: Payer: Self-pay | Admitting: Internal Medicine

## 2013-07-30 ENCOUNTER — Other Ambulatory Visit (INDEPENDENT_AMBULATORY_CARE_PROVIDER_SITE_OTHER): Payer: Self-pay

## 2013-07-30 DIAGNOSIS — C50911 Malignant neoplasm of unspecified site of right female breast: Secondary | ICD-10-CM

## 2013-07-30 MED ORDER — CHLORHEXIDINE GLUCONATE 4 % EX LIQD: 1.0000 "application " | Freq: Once | CUTANEOUS | Status: DC

## 2013-07-30 MED ORDER — CEFAZOLIN SODIUM-DEXTROSE 2-3 GM-% IV SOLR
2.0000 g | INTRAVENOUS | Status: AC
  Administered 2013-07-31: 2 g via INTRAVENOUS
  Filled 2013-07-30: qty 50

## 2013-07-30 NOTE — Telephone Encounter (Signed)
Received referral in EPIC and sent a message to [facility] [place] about scheduling this pt.

## 2013-07-31 ENCOUNTER — Encounter (HOSPITAL_COMMUNITY): Payer: Self-pay | Admitting: *Deleted

## 2013-07-31 ENCOUNTER — Ambulatory Visit (HOSPITAL_COMMUNITY): Payer: Medicare Other | Admitting: Certified Registered"

## 2013-07-31 ENCOUNTER — Encounter (HOSPITAL_COMMUNITY)
Admission: RE | Admit: 2013-07-31 | Discharge: 2013-07-31 | Disposition: A | Discharge status: Home / Self Care | Payer: Medicare Other | Source: Ambulatory Visit | Attending: Surgery | Admitting: Surgery

## 2013-07-31 ENCOUNTER — Ambulatory Visit (HOSPITAL_COMMUNITY)
Admission: RE | Admit: 2013-07-31 | Discharge: 2013-07-31 | Disposition: A | Discharge status: Home / Self Care | Payer: Medicare Other | Source: Ambulatory Visit | Attending: Surgery | Admitting: Surgery

## 2013-07-31 ENCOUNTER — Encounter (HOSPITAL_COMMUNITY): Payer: Medicare Other | Admitting: Vascular Surgery

## 2013-07-31 ENCOUNTER — Other Ambulatory Visit (INDEPENDENT_AMBULATORY_CARE_PROVIDER_SITE_OTHER): Payer: Self-pay | Admitting: Surgery

## 2013-07-31 ENCOUNTER — Encounter (HOSPITAL_COMMUNITY)
Admission: RE | Disposition: A | Discharge status: Home / Self Care | Payer: Self-pay | Source: Ambulatory Visit | Attending: Surgery

## 2013-07-31 DIAGNOSIS — C50919 Malignant neoplasm of unspecified site of unspecified female breast: Secondary | ICD-10-CM | POA: Insufficient documentation

## 2013-07-31 DIAGNOSIS — D0590 Unspecified type of carcinoma in situ of unspecified breast: Secondary | ICD-10-CM | POA: Insufficient documentation

## 2013-07-31 DIAGNOSIS — C50911 Malignant neoplasm of unspecified site of right female breast: Secondary | ICD-10-CM

## 2013-07-31 DIAGNOSIS — D649 Anemia, unspecified: Secondary | ICD-10-CM | POA: Insufficient documentation

## 2013-07-31 DIAGNOSIS — C4360 Malignant melanoma of unspecified upper limb, including shoulder: Secondary | ICD-10-CM

## 2013-07-31 DIAGNOSIS — Y831 Surgical operation with implant of artificial internal device as the cause of abnormal reaction of the patient, or of later complication, without mention of misadventure at the time of the procedure: Secondary | ICD-10-CM | POA: Insufficient documentation

## 2013-07-31 DIAGNOSIS — IMO0001 Reserved for inherently not codable concepts without codable children: Secondary | ICD-10-CM | POA: Insufficient documentation

## 2013-07-31 DIAGNOSIS — F3289 Other specified depressive episodes: Secondary | ICD-10-CM | POA: Insufficient documentation

## 2013-07-31 DIAGNOSIS — I509 Heart failure, unspecified: Secondary | ICD-10-CM | POA: Insufficient documentation

## 2013-07-31 DIAGNOSIS — F329 Major depressive disorder, single episode, unspecified: Secondary | ICD-10-CM | POA: Insufficient documentation

## 2013-07-31 DIAGNOSIS — I252 Old myocardial infarction: Secondary | ICD-10-CM | POA: Insufficient documentation

## 2013-07-31 DIAGNOSIS — E7800 Pure hypercholesterolemia, unspecified: Secondary | ICD-10-CM | POA: Insufficient documentation

## 2013-07-31 DIAGNOSIS — I10 Essential (primary) hypertension: Secondary | ICD-10-CM | POA: Insufficient documentation

## 2013-07-31 DIAGNOSIS — K219 Gastro-esophageal reflux disease without esophagitis: Secondary | ICD-10-CM | POA: Insufficient documentation

## 2013-07-31 DIAGNOSIS — I2510 Atherosclerotic heart disease of native coronary artery without angina pectoris: Secondary | ICD-10-CM | POA: Insufficient documentation

## 2013-07-31 DIAGNOSIS — T85898A Other specified complication of other internal prosthetic devices, implants and grafts, initial encounter: Secondary | ICD-10-CM

## 2013-07-31 DIAGNOSIS — T8549XA Other mechanical complication of breast prosthesis and implant, initial encounter: Secondary | ICD-10-CM | POA: Insufficient documentation

## 2013-07-31 DIAGNOSIS — E559 Vitamin D deficiency, unspecified: Secondary | ICD-10-CM | POA: Insufficient documentation

## 2013-07-31 HISTORY — PX: BREAST LUMPECTOMY WITH NEEDLE LOCALIZATION AND AXILLARY SENTINEL LYMPH NODE BX: SHX5760

## 2013-07-31 HISTORY — PX: SKIN BIOPSY: SHX1

## 2013-07-31 HISTORY — PX: BREAST IMPLANT REMOVAL: SHX5361

## 2013-07-31 SURGERY — BREAST LUMPECTOMY WITH NEEDLE LOCALIZATION AND AXILLARY SENTINEL LYMPH NODE BX
Anesthesia: General | Site: Breast | Laterality: Right

## 2013-07-31 MED ORDER — ONDANSETRON HCL 4 MG/2ML IJ SOLN
INTRAMUSCULAR | Status: DC | PRN
  Administered 2013-07-31: 4 mg via INTRAVENOUS

## 2013-07-31 MED ORDER — FENTANYL CITRATE 0.05 MG/ML IJ SOLN
50.0000 ug | INTRAMUSCULAR | Status: DC | PRN
  Administered 2013-07-31: 100 ug via INTRAVENOUS

## 2013-07-31 MED ORDER — CHLORHEXIDINE GLUCONATE 4 % EX LIQD
1.0000 "application " | Freq: Once | CUTANEOUS | Status: DC
  Filled 2013-07-31: qty 15

## 2013-07-31 MED ORDER — MIDAZOLAM HCL 2 MG/2ML IJ SOLN
INTRAMUSCULAR | Status: AC
  Administered 2013-07-31: 2 mg via INTRAVENOUS
  Filled 2013-07-31: qty 2

## 2013-07-31 MED ORDER — HYDROCODONE-ACETAMINOPHEN 5-325 MG PO TABS: 1.0000 | ORAL_TABLET | Freq: Four times a day (QID) | ORAL | Status: DC | PRN

## 2013-07-31 MED ORDER — 0.9 % SODIUM CHLORIDE (POUR BTL) OPTIME
TOPICAL | Status: DC | PRN
  Administered 2013-07-31: 1000 mL

## 2013-07-31 MED ORDER — LACTATED RINGERS IV SOLN: INTRAVENOUS | Status: DC

## 2013-07-31 MED ORDER — HYDROCODONE-ACETAMINOPHEN 5-325 MG PO TABS
2.0000 | ORAL_TABLET | Freq: Once | ORAL | Status: AC
  Administered 2013-07-31: 1 via ORAL

## 2013-07-31 MED ORDER — SODIUM CHLORIDE 0.9 % IJ SOLN
INTRAMUSCULAR | Status: AC
  Filled 2013-07-31: qty 10

## 2013-07-31 MED ORDER — ROPIVACAINE HCL 5 MG/ML IJ SOLN
INTRAMUSCULAR | Status: DC | PRN
  Administered 2013-07-31: 30 mL via PERINEURAL

## 2013-07-31 MED ORDER — DEXAMETHASONE SODIUM PHOSPHATE 4 MG/ML IJ SOLN
INTRAMUSCULAR | Status: DC | PRN
  Administered 2013-07-31: 4 mg via INTRAVENOUS

## 2013-07-31 MED ORDER — PROPOFOL 10 MG/ML IV BOLUS
INTRAVENOUS | Status: AC
  Filled 2013-07-31: qty 20

## 2013-07-31 MED ORDER — ONDANSETRON HCL 4 MG/2ML IJ SOLN
INTRAMUSCULAR | Status: AC
  Filled 2013-07-31: qty 2

## 2013-07-31 MED ORDER — FENTANYL CITRATE 0.05 MG/ML IJ SOLN
INTRAMUSCULAR | Status: AC
  Administered 2013-07-31: 100 ug via INTRAVENOUS
  Filled 2013-07-31: qty 2

## 2013-07-31 MED ORDER — FENTANYL CITRATE 0.05 MG/ML IJ SOLN
INTRAMUSCULAR | Status: AC
  Filled 2013-07-31: qty 5

## 2013-07-31 MED ORDER — TECHNETIUM TC 99M SULFUR COLLOID FILTERED
1.0000 | Freq: Once | INTRAVENOUS | Status: AC | PRN
  Administered 2013-07-31: 1 via INTRADERMAL

## 2013-07-31 MED ORDER — LIDOCAINE HCL (CARDIAC) 20 MG/ML IV SOLN
INTRAVENOUS | Status: AC
  Filled 2013-07-31: qty 5

## 2013-07-31 MED ORDER — PHENYLEPHRINE 40 MCG/ML (10ML) SYRINGE FOR IV PUSH (FOR BLOOD PRESSURE SUPPORT)
PREFILLED_SYRINGE | INTRAVENOUS | Status: AC
  Filled 2013-07-31: qty 10

## 2013-07-31 MED ORDER — PHENYLEPHRINE HCL 10 MG/ML IJ SOLN
INTRAMUSCULAR | Status: DC | PRN
  Administered 2013-07-31 (×2): 120 ug via INTRAVENOUS
  Administered 2013-07-31 (×2): 80 ug via INTRAVENOUS
  Administered 2013-07-31: 120 ug via INTRAVENOUS
  Administered 2013-07-31 (×2): 80 ug via INTRAVENOUS
  Administered 2013-07-31: 120 ug via INTRAVENOUS

## 2013-07-31 MED ORDER — ARTIFICIAL TEARS OP OINT
TOPICAL_OINTMENT | OPHTHALMIC | Status: DC | PRN
  Administered 2013-07-31: 1 via OPHTHALMIC

## 2013-07-31 MED ORDER — METHYLENE BLUE 1 % INJ SOLN
INTRAMUSCULAR | Status: AC
  Filled 2013-07-31: qty 10

## 2013-07-31 MED ORDER — ONDANSETRON HCL 4 MG/2ML IJ SOLN
INTRAMUSCULAR | Status: AC
  Administered 2013-07-31: 4 mg
  Filled 2013-07-31: qty 2

## 2013-07-31 MED ORDER — LACTATED RINGERS IV SOLN
INTRAVENOUS | Status: DC | PRN
  Administered 2013-07-31 (×2): via INTRAVENOUS

## 2013-07-31 MED ORDER — HYDROCODONE-ACETAMINOPHEN 5-325 MG PO TABS
ORAL_TABLET | ORAL | Status: AC
  Filled 2013-07-31: qty 1

## 2013-07-31 MED ORDER — PROPOFOL 10 MG/ML IV BOLUS
INTRAVENOUS | Status: DC | PRN
  Administered 2013-07-31: 200 mg via INTRAVENOUS
  Administered 2013-07-31: 100 mg via INTRAVENOUS

## 2013-07-31 MED ORDER — BUPIVACAINE HCL (PF) 0.25 % IJ SOLN
INTRAMUSCULAR | Status: DC | PRN
  Administered 2013-07-31: 1 mL

## 2013-07-31 MED ORDER — FENTANYL CITRATE 0.05 MG/ML IJ SOLN
INTRAMUSCULAR | Status: DC | PRN
  Administered 2013-07-31 (×5): 50 ug via INTRAVENOUS

## 2013-07-31 MED ORDER — HYDROMORPHONE HCL PF 1 MG/ML IJ SOLN
INTRAMUSCULAR | Status: AC
  Filled 2013-07-31: qty 1

## 2013-07-31 MED ORDER — LIDOCAINE HCL (CARDIAC) 20 MG/ML IV SOLN
INTRAVENOUS | Status: DC | PRN
  Administered 2013-07-31: 45 mg via INTRAVENOUS

## 2013-07-31 MED ORDER — MIDAZOLAM HCL 2 MG/2ML IJ SOLN
1.0000 mg | INTRAMUSCULAR | Status: DC | PRN
  Administered 2013-07-31: 2 mg via INTRAVENOUS

## 2013-07-31 MED ORDER — BUPIVACAINE HCL (PF) 0.25 % IJ SOLN
INTRAMUSCULAR | Status: AC
  Filled 2013-07-31: qty 30

## 2013-07-31 MED ORDER — LACTATED RINGERS IV SOLN
INTRAVENOUS | Status: DC
  Administered 2013-07-31: 13:00:00 via INTRAVENOUS

## 2013-07-31 MED ORDER — SODIUM CHLORIDE 0.9 % IJ SOLN
INTRAMUSCULAR | Status: DC | PRN
  Administered 2013-07-31: 13:00:00 via INTRAMUSCULAR

## 2013-07-31 MED ORDER — HYDROMORPHONE HCL PF 1 MG/ML IJ SOLN
0.2500 mg | INTRAMUSCULAR | Status: DC | PRN
  Administered 2013-07-31 (×4): 0.5 mg via INTRAVENOUS

## 2013-07-31 SURGICAL SUPPLY — 71 items
ADH SKN CLS APL DERMABOND .7 (GAUZE/BANDAGES/DRESSINGS) ×4
APPLIER CLIP 9.375 MED OPEN (MISCELLANEOUS)
APPLIER CLIP 9.375 SM OPEN (CLIP)
APR CLP MED 9.3 20 MLT OPN (MISCELLANEOUS)
APR CLP SM 9.3 20 MLT OPN (CLIP)
BINDER BREAST LRG (GAUZE/BANDAGES/DRESSINGS) IMPLANT
BINDER BREAST XLRG (GAUZE/BANDAGES/DRESSINGS) ×2 IMPLANT
CANISTER SUCTION 2500CC (MISCELLANEOUS) ×4 IMPLANT
CHLORAPREP W/TINT 10.5 ML (MISCELLANEOUS) ×2 IMPLANT
CHLORAPREP W/TINT 26ML (MISCELLANEOUS) ×4 IMPLANT
CLIP APPLIE 9.375 MED OPEN (MISCELLANEOUS) ×2 IMPLANT
CLIP APPLIE 9.375 SM OPEN (CLIP) IMPLANT
CLIP TI WIDE RED SMALL 6 (CLIP) ×6 IMPLANT
CONT SPEC 4OZ CLIKSEAL STRL BL (MISCELLANEOUS) ×4 IMPLANT
COVER PROBE W GEL 5X96 (DRAPES) ×6 IMPLANT
COVER SURGICAL LIGHT HANDLE (MISCELLANEOUS) ×4 IMPLANT
DERMABOND ADVANCED (GAUZE/BANDAGES/DRESSINGS) ×4
DERMABOND ADVANCED .7 DNX12 (GAUZE/BANDAGES/DRESSINGS) ×2 IMPLANT
DEVICE DUBIN SPECIMEN MAMMOGRA (MISCELLANEOUS) ×4 IMPLANT
DRAIN CHANNEL 19F RND (DRAIN) ×2 IMPLANT
DRAPE CHEST BREAST 15X10 FENES (DRAPES) ×4 IMPLANT
DRAPE PROXIMA HALF (DRAPES) ×6 IMPLANT
DRAPE UTILITY 15X26 W/TAPE STR (DRAPE) ×8 IMPLANT
ELECT COATED BLADE 2.86 ST (ELECTRODE) ×6 IMPLANT
ELECT REM PT RETURN 9FT ADLT (ELECTROSURGICAL) ×4
ELECTRODE REM PT RTRN 9FT ADLT (ELECTROSURGICAL) ×2 IMPLANT
EVACUATOR SILICONE 100CC (DRAIN) ×2 IMPLANT
GAUZE SPONGE 4X4 16PLY XRAY LF (GAUZE/BANDAGES/DRESSINGS) ×8 IMPLANT
GLOVE BIO SURGEON STRL SZ7.5 (GLOVE) ×2 IMPLANT
GLOVE BIOGEL PI IND STRL 6.5 (GLOVE) IMPLANT
GLOVE BIOGEL PI IND STRL 7.0 (GLOVE) IMPLANT
GLOVE BIOGEL PI IND STRL 7.5 (GLOVE) IMPLANT
GLOVE BIOGEL PI INDICATOR 6.5 (GLOVE) ×2
GLOVE BIOGEL PI INDICATOR 7.0 (GLOVE) ×6
GLOVE BIOGEL PI INDICATOR 7.5 (GLOVE) ×2
GLOVE BIOGEL PI ORTHO PRO SZ7 (GLOVE) ×2
GLOVE PI ORTHO PRO STRL SZ7 (GLOVE) IMPLANT
GLOVE SS BIOGEL STRL SZ 7 (GLOVE) IMPLANT
GLOVE SS BIOGEL STRL SZ 7.5 (GLOVE) IMPLANT
GLOVE SUPERSENSE BIOGEL SZ 7 (GLOVE) ×4
GLOVE SUPERSENSE BIOGEL SZ 7.5 (GLOVE) ×2
GLOVE SURG SIGNA 7.5 PF LTX (GLOVE) ×12 IMPLANT
GLOVE SURG SS PI 7.0 STRL IVOR (GLOVE) ×2 IMPLANT
GOWN STRL NON-REIN LRG LVL3 (GOWN DISPOSABLE) ×8 IMPLANT
GOWN STRL REIN XL XLG (GOWN DISPOSABLE) ×4 IMPLANT
GOWN STRL REUS W/ TWL XL LVL3 (GOWN DISPOSABLE) IMPLANT
GOWN STRL REUS W/TWL XL LVL3 (GOWN DISPOSABLE) ×4
KIT BASIN OR (CUSTOM PROCEDURE TRAY) ×4 IMPLANT
KIT MARKER MARGIN INK (KITS) ×4 IMPLANT
KIT ROOM TURNOVER OR (KITS) ×4 IMPLANT
NDL 18GX1X1/2 (RX/OR ONLY) (NEEDLE) ×2 IMPLANT
NDL HYPO 25GX1X1/2 BEV (NEEDLE) ×4 IMPLANT
NEEDLE 18GX1X1/2 (RX/OR ONLY) (NEEDLE) ×4 IMPLANT
NEEDLE HYPO 25GX1X1/2 BEV (NEEDLE) ×8 IMPLANT
NS IRRIG 1000ML POUR BTL (IV SOLUTION) ×8 IMPLANT
PACK GENERAL/GYN (CUSTOM PROCEDURE TRAY) ×4 IMPLANT
PAD ARMBOARD 7.5X6 YLW CONV (MISCELLANEOUS) ×6 IMPLANT
PEN SKIN MARKING BROAD (MISCELLANEOUS) ×2 IMPLANT
PENCIL BUTTON HOLSTER BLD 10FT (ELECTRODE) ×2 IMPLANT
SPECIMEN JAR SMALL (MISCELLANEOUS) ×2 IMPLANT
SPONGE GAUZE 4X4 12PLY (GAUZE/BANDAGES/DRESSINGS) ×4 IMPLANT
STAPLER VISISTAT 35W (STAPLE) IMPLANT
SUT ETHILON 2 0 FS 18 (SUTURE) ×2 IMPLANT
SUT ETHILON 4 0 PS 2 18 (SUTURE) ×2 IMPLANT
SUT MNCRL AB 4-0 PS2 18 (SUTURE) ×2 IMPLANT
SUT MON AB 4-0 PC3 18 (SUTURE) ×2 IMPLANT
SUT MON AB 5-0 PS2 18 (SUTURE) ×6 IMPLANT
SUT VIC AB 3-0 SH 18 (SUTURE) ×6 IMPLANT
SYR CONTROL 10ML LL (SYRINGE) ×8 IMPLANT
TOWEL OR 17X24 6PK STRL BLUE (TOWEL DISPOSABLE) ×4 IMPLANT
TOWEL OR 17X26 10 PK STRL BLUE (TOWEL DISPOSABLE) ×4 IMPLANT

## 2013-07-31 NOTE — Progress Notes (Signed)
Care of pt assumed by [facility]. Pt says she is feeling better. Will monitor a bit longer and trial po fluids. Spouse at bedside. JP drain care reviewed and they verbalize understanding.

## 2013-07-31 NOTE — Anesthesia Postprocedure Evaluation (Signed)
  Anesthesia Post-op Note  Patient: [name]  Procedure(s) Performed: Procedure(s): RIGHT BREAST LUMPECTOMY WITH NEEDLE LOCALIZATION AND AXILLARY SENTINEL LYMPH NODE BIOPSY (Right) BIOPSY SKIN LESION RIGHT ARM (Right) REMOVAL RUPTURED RIGHT SILICONE BREAST IMPLANT WITH CAPSULE (Right)  Patient Location: PACU  Anesthesia Type:General  Level of Consciousness: awake  Airway and Oxygen Therapy: Patient Spontanous Breathing  Post-op Pain: mild  Post-op Assessment: Post-op Vital signs reviewed  Post-op Vital Signs: Reviewed  Complications: No apparent anesthesia complications
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
## 2013-07-31 NOTE — Op Note (Addendum)
07/31/2013  4:11 PM  PATIENT:  [name] DOB: 09-29-1940 MRN: [id number]  PREOP DIAGNOSIS:  RIGHT BREAST CANCER  POSTOP DIAGNOSIS:   Right breast cancer  , 2 o'clock position (T1, N0), Ruptured right breast implant with chronically scarred capsule  PROCEDURE:   Procedure(s): RIGHT BREAST LUMPECTOMY WITH NEEDLE LOCALIZATION AND AXILLARY SENTINEL LYMPH NODE BIOPSY, BIOPSY SKIN LESION RIGHT ARM, REMOVAL RUPTURED RIGHT SILICONE BREAST IMPLANT WITH CAPSULE, Injection of peri areolar area of breast with methylene blue (0.5 cc) [photo at end of dictation]  SURGEON:   Alphonsa Overall, M.D.  ANESTHESIA:   general  Anesthesiologist: Finis Bud, MD CRNA: Barrington Ellison, CRNA; Wanita Chamberlain, CRNA  General  EBL:  75  ml  DRAINS: none   LOCAL MEDICATIONS USED:   None (Pt had pectoral block)  SPECIMEN:   Right arm (long suture distal, short suture anterior), right axillary lymph node, right breast lumpectomy with implant capsule  COUNTS CORRECT:  YES  INDICATIONS FOR PROCEDURE:  [name] is a 73 y.o. (DOB: 13-Dec-1940) white  female whose primary care physician is Osborne Casco, MD and comes for right breast lumpectomy and right axillary sentinel lymph node biopsy.  She also has ruptured breast implant that I will probably have to remove.n  She also has a skin lesion on the upper posterior aspect of her right arm that I am going to excise at this time.   Options for breast cancer treatment were discussed with the patient. She elected to proceed with lumpectomy and axillary sentinel lymph node.     The indications and potential complications of surgery were explained to the patient. Potential complications include, but are not limited to, bleeding, infection, the need for further surgery, and nerve injury.     The patient had a needle loc wire placed at the 2 o'clock position of the right breast at [facility] by Dr. Luan Pulling.  In the holding area, her right areola was injected  with 1 millicurie of Technitium Sulfur Colloid.  OPERATIVE NOTE:   The patient was taken to room # 2 at [facility] where she underwent a general anesthesia  supervised by Anesthesiologist: Finis Bud, MD CRNA: Barrington Ellison, CRNA; Wanita Chamberlain, CRNA. Her right breast and axilla were prepped with  ChloraPrep and sterilely draped.    A time-out and the surgical check list was reviewed.   First I excised a 1.0 cm suspicious lesion on the upper posterior aspect of her right arm.  I marked the specimen with a long suture distal and short suture anterior.  The skin was closed with a 5-0 monocryl and single 5-0 nylon suture.    I injected about 0.5 mL of 40% methylene blue around her right areola.   I started with the right axillary sentinel lymph node biopsy. I found a hot area at the junction of the breast and the pectoralis major muscle. I cut down and  identified a hot node that had counts of 650 and the background has 0 counts. The lymph node was not blue. I checked her internal mammary nodes and supraclavicular nodes with the neoprobe and found no other hot area. The axillary node was then sent to pathology.    I turned attention to the cancer which was about at the 2 o'clock position of the right breast. I cut down around the wire and tried to take an ellipse of breast tissue around the tumor by at least 1 cm.   I excised this block  of breast tissue approximately 4 cm by 4 cm  in diameter.  The tumor sat on a submammary implant.  I cut into the capsule and ruptured silicone came out of the capsule.  The capsule was brittle, calcified, and I thought needed to be removed. I was then forced to removed all the silicone and I removed the capsule of the implant.   In removing the capsule, I elevated the breast off the pectoralis muscle.    I painted the lumpectomy specimen with the 5 color paint kit and did a specimen mammogram which confirmed the mass, clip and the wire were all in the right  position.  I could not paint the posterior aspect of the specimen because of the capsule.  Dr. Bobbye Riggs did a gross examination of the specimen and thought we had completely excised the tumor.   I then irrigated the wound with saline.   I irrigated with 3 liters of fluid and I spent a fair amount of time cleaning up the wound to make sure there was silicone or capsule left behind.  Since she had a "pectoral" block, we had already used the maximum amount of local anesthetic.   I then closed all the wounds in layers using 3-0 Vicryl sutures for the deep layer. At the skin, I closed the incisions with a 5-0 Monocryl suture. The incisions were then painted with Dermabond.  She had gauze place over the wounds and placed in a breast binder.   The patient tolerated the procedure well, was transported to the recovery room in good condition. Sponge and needle count were correct at the end of the case.   Final pathology is pending.   Note skin and wire (coming out a 3 o'clock). The whole mass towards the 10 o'clock position is capsule.  Alphonsa Overall, MD, [facility] Pager: [phone] Office phone:  [phone]

## 2013-07-31 NOTE — Transfer of Care (Signed)
Immediate Anesthesia Transfer of Care Note  Patient: [name]  Procedure(s) Performed: Procedure(s): RIGHT BREAST LUMPECTOMY WITH NEEDLE LOCALIZATION AND AXILLARY SENTINEL LYMPH NODE BIOPSY (Right) BIOPSY SKIN LESION RIGHT ARM (Right) REMOVAL RUPTURED RIGHT SILICONE BREAST IMPLANT WITH CAPSULE (Right)  Patient Location: PACU  Anesthesia Type:General  Level of Consciousness: awake, alert  and oriented  Airway & Oxygen Therapy: Patient Spontanous Breathing and Patient connected to nasal cannula oxygen  Post-op Assessment: Report given to PACU RN  Post vital signs: Reviewed and stable  Complications: No apparent anesthesia complications

## 2013-07-31 NOTE — Anesthesia Procedure Notes (Signed)
Anesthesia Regional Block:  Pectoralis block  Pre-Anesthetic Checklist: ,, timeout performed, Correct Patient, Correct Site, Correct Laterality, Correct Procedure, Correct Position, site marked, Risks and benefits discussed,  Surgical consent,  Pre-op evaluation,  At surgeon's request and post-op pain management  Laterality: Right  Prep: chloraprep       Needles:  Injection technique: Single-shot  Needle Type: Echogenic Stimulator Needle          Additional Needles:  Procedures: ultrasound guided (picture in chart) Pectoralis block Narrative:  Start time: 07/31/2013 1:36 PM End time: 07/31/2013 1:44 PM Injection made incrementally with aspirations every 5 mL.  Performed by: Personally  Anesthesiologist: Ermalene Postin

## 2013-07-31 NOTE — Discharge Instructions (Signed)
[facility] - DISCHARGE INSTRUCTIONS TO PATIENT  Activity:  Driving - May drive in 2 or 3 days.   Lifting - No lifting more than 15 pounds for 1 week, then no limit.  Wound Care:   Leave bandage on.           Empty drain at least once a day.  Record the amount of drainage.  Diet:  As tolerated.  Follow up appointment:  Call Dr. Pollie Friar office [facility]) at [phone] for an appointment in this Friday.  Medications and dosages:  Resume your home medications.  You have a prescription for:  Vicodin  Call Dr. Lucia Gaskins or his office  [phone]) if you have:  Temperature greater than 100.4,  Persistent nausea and vomiting,  Severe uncontrolled pain,  Redness, tenderness, or signs of infection (pain, swelling, redness, odor or green/yellow discharge around the site),  Difficulty breathing, headache or visual disturbances,  Any other questions or concerns you may have after discharge.  In an emergency, call 911 or go to an Emergency Department at a nearby hospital.

## 2013-07-31 NOTE — Interval H&P Note (Signed)
History and Physical Interval Note:  07/31/2013 1:27 PM  [name]  has presented today for surgery, with the diagnosis of RIGHT BREAST CANCER  The various methods of treatment have been discussed with the patient and family.   Her husband is here today.  After consideration of risks, benefits and other options for treatment, the patient has consented to  Procedure(s): RIGHT BREAST LUMPECTOMY WITH NEEDLE LOCALIZATION AND AXILLARY SENTINEL LYMPH NODE BIOPSY (Right) BIOPSY SKIN LESION RIGHT ARM (Right) as a surgical intervention.  Also plan to remove right breast implant.  Preop studies have suggested that it is ruptured.    The patient's history has been reviewed, patient examined, no change in status, stable for surgery.  I have reviewed the patient's chart and labs.  Questions were answered to the patient's satisfaction.     [name]

## 2013-07-31 NOTE — Progress Notes (Signed)
BRP w/asst. Voided. Says she feels better & wants to go home, denies nausea. Looks better.

## 2013-07-31 NOTE — Preoperative (Signed)
Beta Blockers   Reason not to administer Beta Blockers:Not Applicable 

## 2013-07-31 NOTE — Anesthesia Preprocedure Evaluation (Addendum)
Anesthesia Evaluation  Patient identified by MRN, date of birth, ID band Patient awake    Reviewed: Allergy & Precautions, H&P , NPO status , Patient's Chart, lab work & pertinent test results  History of Anesthesia Complications (+) PONV and history of anesthetic complications  Airway Mallampati: II TM Distance: >3 FB Neck ROM: Full    Dental  (+) Teeth Intact and Dental Advisory Given,    Pulmonary neg pulmonary ROS,    Pulmonary exam normal       Cardiovascular hypertension, Pt. on medications - angina- CAD, - Past MI and - CHF Rhythm:Regular Rate:Normal     Neuro/Psych Depression  Neuromuscular disease    GI/Hepatic GERD-  Medicated and Controlled,  Endo/Other  negative endocrine ROS  Renal/GU negative Renal ROS  negative genitourinary   Musculoskeletal  (+) Fibromyalgia -  Abdominal   Peds  Hematology  (+) anemia ,   Anesthesia Other Findings   Reproductive/Obstetrics                          Anesthesia Physical Anesthesia Plan  ASA: II  Anesthesia Plan: General and Regional   Post-op Pain Management:    Induction:   Airway Management Planned: LMA  Additional Equipment: None  Intra-op Plan:   Post-operative Plan: Extubation in OR  Informed Consent: I have reviewed the patients History and Physical, chart, labs and discussed the procedure including the risks, benefits and alternatives for the proposed anesthesia with the patient or authorized representative who has indicated his/her understanding and acceptance.   Dental advisory given  Plan Discussed with: CRNA and Surgeon  Anesthesia Plan Comments:         Anesthesia Quick Evaluation

## 2013-07-31 NOTE — Progress Notes (Signed)
Pt became nauseated when getting to WC.  Pt now laying back in stretcher.   Will keep pt in pacu awhile longer.

## 2013-08-01 ENCOUNTER — Telehealth (INDEPENDENT_AMBULATORY_CARE_PROVIDER_SITE_OTHER): Payer: Self-pay

## 2013-08-01 ENCOUNTER — Encounter: Payer: Self-pay | Admitting: *Deleted

## 2013-08-01 ENCOUNTER — Telehealth: Payer: Self-pay | Admitting: *Deleted

## 2013-08-01 ENCOUNTER — Telehealth (INDEPENDENT_AMBULATORY_CARE_PROVIDER_SITE_OTHER): Payer: Self-pay | Admitting: General Surgery

## 2013-08-01 NOTE — Telephone Encounter (Signed)
Patient states  she needed to take one of her Vicodin this am but she is doing ok now, advised to call if her condition changes . Informed her she has appt 08-03-13@945am  . Patient verbalized understanding

## 2013-08-01 NOTE — Telephone Encounter (Signed)
Pt called to report she has a very sore throat today (surgery yesterday.)  Explained that the ET tube has probably irritated her throat and the effect of it will resolve over the next 48 hours or so.  In the meantime, gargle TID to QID with warm salt water, drink lots of fluids and eat warm soft foods (like oatmeal, brothy soups, mashed potatoes, etc.)  She will do all this and call back if not substantially improved by this time tomorrow.

## 2013-08-01 NOTE — Progress Notes (Signed)
Received scheduling instructions from [facility] at [place].  Gave paperwork to Dr. Humphrey Rolls for an appt.  Emailed Glenda at [place] to make her aware.

## 2013-08-01 NOTE — Telephone Encounter (Signed)
Spoke with Karena Addison at [facility] short stay and scheduled patient for IV Feraheme on 08/08/13 at 1:00PM and 08/15/13 at 1:00 PM.

## 2013-08-01 NOTE — Telephone Encounter (Signed)
Message copied by Hulan Saas on Wed Aug 01, 2013 11:17 AM ------      Message from: Lafayette Dragon      Created: Tue Jul 31, 2013 10:44 AM      Regarding: RE: low iron       Thanx. OK we will check with her next week.            Rollene Fare, could you, please, check on Mrs Paul next week and set her up for SBCE and for Feraheme infusion x2.      ----- Message -----         From: Shann Medal, MD         Sent: 07/31/2013   9:44 AM           To: Lafayette Dragon, MD      Subject: RE: low iron                                             Dora,      Thanks for your note.      Yes, her surgery is today.            If that goes well, I was going to get a CT scan abdomen/pelvis later this week or early next week, depending on how she is feeling.      If that is negative (or maybe even if it is positive) a SBCE sounds good.      I would think an Fe infusion later this week or next week would be great.      I guess if all test prove negative, she'll need to see someone from hematology.      I've also spoken to Kelton Pillar about all of this      Thanks,      Shanon Brow            ----- Message -----         From: Lafayette Dragon, MD         Sent: 07/31/2013   9:12 AM           To: Shann Medal, MD      Subject: low iron                                                 Shanon Brow, I have talked to Ms Kenton Kingfisher about an Iron infusion for severe Iron def.anemia. She told me she was having breast surgery today. Her EGD/Colon did not show any source of bleeding, so , SBCE may be the next step. She said you were considering CT scan of the abdomen which is a great idea.I can arrange for the Iron infusion when you think it is possible from your standpoint. Delfin Edis             ------

## 2013-08-02 ENCOUNTER — Telehealth: Payer: Self-pay | Admitting: *Deleted

## 2013-08-02 ENCOUNTER — Encounter: Payer: Self-pay | Admitting: *Deleted

## 2013-08-02 NOTE — Telephone Encounter (Signed)
Received appt date and time from Dr. Humphrey Rolls and called to offer it to the pt and she already has another appt at that time and cannot make the appt.  Gave paperwork back to Dr. Humphrey Rolls for another appt date and time.

## 2013-08-02 NOTE — Telephone Encounter (Signed)
Received another appt date and time from Dr. Humphrey Rolls.  Called and left a message for the pt to return my call so I can get her scheduled.  Emailed Glenda at [facility] to make her aware.

## 2013-08-02 NOTE — Telephone Encounter (Signed)
Pt returned my call and I confirmed 08/21/13 appt w/ her.  Mailed before appt letter, welcome packet & intake form to pt.  Emailed Glenda at [place] to make aware.  Emailed Santiago Glad in [place] to make her aware.  Took paperwork to Med [facility] for chart.

## 2013-08-03 ENCOUNTER — Ambulatory Visit (INDEPENDENT_AMBULATORY_CARE_PROVIDER_SITE_OTHER): Payer: Medicare Other | Admitting: Surgery

## 2013-08-03 ENCOUNTER — Encounter (INDEPENDENT_AMBULATORY_CARE_PROVIDER_SITE_OTHER): Payer: Self-pay | Admitting: Surgery

## 2013-08-03 ENCOUNTER — Encounter (INDEPENDENT_AMBULATORY_CARE_PROVIDER_SITE_OTHER): Payer: Self-pay

## 2013-08-03 ENCOUNTER — Other Ambulatory Visit (INDEPENDENT_AMBULATORY_CARE_PROVIDER_SITE_OTHER): Payer: Self-pay

## 2013-08-03 VITALS — BP 162/90 | HR 102 | Temp 98.3°F | Resp 16 | Ht 65.0 in | Wt 204.8 lb

## 2013-08-03 DIAGNOSIS — C50911 Malignant neoplasm of unspecified site of right female breast: Secondary | ICD-10-CM

## 2013-08-03 DIAGNOSIS — C50919 Malignant neoplasm of unspecified site of unspecified female breast: Secondary | ICD-10-CM

## 2013-08-03 DIAGNOSIS — L989 Disorder of the skin and subcutaneous tissue, unspecified: Secondary | ICD-10-CM

## 2013-08-03 DIAGNOSIS — D649 Anemia, unspecified: Secondary | ICD-10-CM

## 2013-08-03 NOTE — Telephone Encounter (Signed)
Spoke with patient and she will go to short stay for IV feraheme as scheduled. She is doing well. She wants to wait to schedule SBCE after her CT scan. She will call back to schedule this.
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
## 2013-08-06 ENCOUNTER — Telehealth (INDEPENDENT_AMBULATORY_CARE_PROVIDER_SITE_OTHER): Payer: Self-pay | Admitting: Surgery

## 2013-08-06 ENCOUNTER — Other Ambulatory Visit: Payer: Self-pay | Admitting: *Deleted

## 2013-08-06 ENCOUNTER — Telehealth (INDEPENDENT_AMBULATORY_CARE_PROVIDER_SITE_OTHER): Payer: Self-pay

## 2013-08-06 DIAGNOSIS — D509 Iron deficiency anemia, unspecified: Secondary | ICD-10-CM

## 2013-08-06 NOTE — Telephone Encounter (Signed)
CT abdomen/pelvis scan challenged and need peer to peer.  Case #: [id number] Phone call to: [phone]  Talked to Ms. [place] ([employer]) in [place], [place]  Dr. Claris Gower (ER/FP) - [place].   Approval number: [id number] [phone] [id number]    (expires 09/21/2103)  It took me about 15 minutes to do this.  D. [employer]

## 2013-08-06 NOTE — Telephone Encounter (Signed)
V/M Informed patient DR. Lucia Gaskins has spoken to  [employer] concerning  Peer to Peer , He received a Authorization of services/Ct exp 4/2, she can have her Ct Abd/Pelvis at her Convenience .

## 2013-08-07 ENCOUNTER — Ambulatory Visit (HOSPITAL_COMMUNITY): Payer: Medicare Other

## 2013-08-07 ENCOUNTER — Encounter (INDEPENDENT_AMBULATORY_CARE_PROVIDER_SITE_OTHER): Payer: Medicare Other

## 2013-08-08 ENCOUNTER — Ambulatory Visit (INDEPENDENT_AMBULATORY_CARE_PROVIDER_SITE_OTHER): Payer: Medicare Other

## 2013-08-08 ENCOUNTER — Encounter (INDEPENDENT_AMBULATORY_CARE_PROVIDER_SITE_OTHER): Payer: Self-pay

## 2013-08-08 ENCOUNTER — Encounter (HOSPITAL_COMMUNITY): Payer: Self-pay

## 2013-08-08 ENCOUNTER — Encounter (HOSPITAL_COMMUNITY)
Admission: RE | Admit: 2013-08-08 | Discharge: 2013-08-08 | Disposition: A | Discharge status: Home / Self Care | Payer: Medicare Other | Source: Ambulatory Visit | Attending: Internal Medicine | Admitting: Internal Medicine

## 2013-08-08 ENCOUNTER — Other Ambulatory Visit (HOSPITAL_COMMUNITY): Payer: Self-pay | Admitting: Internal Medicine

## 2013-08-08 VITALS — BP 129/61 | HR 105 | Temp 98.2°F | Resp 16

## 2013-08-08 DIAGNOSIS — D509 Iron deficiency anemia, unspecified: Secondary | ICD-10-CM | POA: Insufficient documentation

## 2013-08-08 MED ORDER — FERUMOXYTOL INJECTION 510 MG/17 ML
510.0000 mg | Freq: Once | INTRAVENOUS | Status: AC
  Administered 2013-08-08: 510 mg via INTRAVENOUS
  Filled 2013-08-08: qty 17

## 2013-08-08 MED ORDER — SODIUM CHLORIDE 0.9 % IV SOLN
INTRAVENOUS | Status: DC
  Administered 2013-08-08: 14:00:00 via INTRAVENOUS

## 2013-08-08 NOTE — Discharge Instructions (Signed)

## 2013-08-08 NOTE — Progress Notes (Signed)
Pt received 1st dose of IV 510mg  feraheme today, pt stayed 6min. Post iv push and experienced no adverse reactions.  Pt to receive 2nd dose on 08/15/13.

## 2013-08-08 NOTE — Progress Notes (Unsigned)
Patient in for nurse only drain removal/left arm suture removal; Patient Afebrile, Drain with 15cc yellow fluid noted, cleansed area with Chlorea prep, removed suture , removed drain, apply dressing secured with Tape. Left arm cleansed area with chlorea prep , removed 1 suture, applied steri strep glue , apply steri strep to incision. Advised patient to call if temp 100.3 or greater , drainage, redness patient verbalized understanding

## 2013-08-09 ENCOUNTER — Ambulatory Visit (HOSPITAL_COMMUNITY)
Admission: RE | Admit: 2013-08-09 | Discharge: 2013-08-09 | Disposition: A | Discharge status: Home / Self Care | Payer: Medicare Other | Source: Ambulatory Visit | Attending: Surgery | Admitting: Surgery

## 2013-08-09 ENCOUNTER — Other Ambulatory Visit (INDEPENDENT_AMBULATORY_CARE_PROVIDER_SITE_OTHER): Payer: Self-pay | Admitting: Surgery

## 2013-08-09 ENCOUNTER — Encounter (HOSPITAL_COMMUNITY): Payer: Self-pay

## 2013-08-09 DIAGNOSIS — I700 Atherosclerosis of aorta: Secondary | ICD-10-CM | POA: Insufficient documentation

## 2013-08-09 DIAGNOSIS — D649 Anemia, unspecified: Secondary | ICD-10-CM | POA: Insufficient documentation

## 2013-08-09 DIAGNOSIS — R109 Unspecified abdominal pain: Secondary | ICD-10-CM | POA: Insufficient documentation

## 2013-08-09 DIAGNOSIS — K449 Diaphragmatic hernia without obstruction or gangrene: Secondary | ICD-10-CM | POA: Insufficient documentation

## 2013-08-09 MED ORDER — IOHEXOL 300 MG/ML  SOLN
80.0000 mL | Freq: Once | INTRAMUSCULAR | Status: AC | PRN
  Administered 2013-08-09: 80 mL via INTRAVENOUS

## 2013-08-13 ENCOUNTER — Encounter: Payer: Self-pay | Admitting: *Deleted

## 2013-08-13 ENCOUNTER — Encounter (INDEPENDENT_AMBULATORY_CARE_PROVIDER_SITE_OTHER): Payer: Self-pay

## 2013-08-13 NOTE — Progress Notes (Signed)
Location of Breast Cancer: Right Breast, upper inner  Histology per Pathology Report:   07/31/12 Diagnosis 1. Breast, lumpectomy, Right - INVASIVE LOBULAR CARCINOMA SEE COMMENT. - LYMPH VASCULAR INVASION IS IDENTIFIED. - PERINEURAL INVASION IDENTIFIED. - INVASIVE TUMOR IS GREATER THAN 1 CM FROM ALL MARGINS. 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 - PREVIOUS BIOPSY SITE - IMPLANT ASSOCIATED FIBROTIC CAPSULE WITH FAT NECROSIS AND CALCIFICATIONS; NEGATIVE FOR ATYPIA OR MALIGNANCY - SEE TUMOR SYNOPTIC TEMPLATE BELOW. 2. Skin , Right Arm - INVASIVE MALIGNANT MELANOMA. 1 of 4 FINAL for [name], [name] ([id number]) Diagnosis(continued) - SEE COMMENT. 3. Lymph node, sentinel, biopsy, Right Axillary - ONE LYMPH NODE, NEGATIVE FOR TUMOR (0/1) SEE COMMENT. 4. Lymph node, sentinel, biopsy, Right Axillary - ONE LYMPH NODE, NEGATIVE FOR TUMOR (0/1) SEE COMMENT. 5. Lymph node, sentinel, biopsy, Right Axillary - ONE LYMPH NODE, NEGATIVE FOR TUMOR (0/1) SEE COMMENT. 6. Lymph node, sentinel, biopsy, Right Axillary - ONE LYMPH NODE, NEGATIVE FOR TUMOR (0/1) SEE COMMENT  07/12/13 Diagnosis Breast, right, needle core biopsy, mass - INVASIVE MAMMARY CARCINOMA. - SEE COMMENT. Microscopic Comment The carcinoma is grade I-II and has lobular features. A breast prognostic profile will be performed and the results reported separately  Receptor Status: ER(100%), PR (8%), Her2-neu (No amplification), Ki-67(10%)   Found on screening mammography?: She went for her routine mammogram in Dec 2014 and she was called back for further evaluation. She got a core biopsy of her right breast which showed cancer.  She had bilateral subpectoral breast implants about 25 years ago by Dr. Evlyn Clines.  Mammogram [facility]) - 06/20/2103 - New irregular mass 4 o'clock right breast US ([facility]) - 07/09/2013 - 1.5 x 1.4 cm mass at 2 o'clock right breast suggestive of malignancy. Bilateral silicone implants  are intact. Biopsy right breast (Accession: [id number]) - 07/12/2013 - invasive mammary carcinoma, lobular features, Grade 1-2.     Past/Anticipated interventions by surgeon, if CZY:SAYTK Breast Lumpectomy  Past/Anticipated interventions by medical oncology, if any: Seen by Dr. Philomena Course on 08/21/13  Lymphedema issues, if any: None  Pain issues, if any:   SAFETY ISSUES:  Prior radiation? NO  Pacemaker/ICD? NO  Possible current pregnancy?NO       Is the patient on methotrexate?NO   Current Complaints / other details:

## 2013-08-14 ENCOUNTER — Ambulatory Visit: Payer: Medicare Other | Admitting: Radiation Oncology

## 2013-08-14 ENCOUNTER — Telehealth: Payer: Self-pay | Admitting: *Deleted

## 2013-08-14 ENCOUNTER — Ambulatory Visit: Payer: Medicare Other

## 2013-08-14 NOTE — Telephone Encounter (Signed)
Called patient home to see if she was going to come in today,per patient she stated "her husband called and cancelled her appt today due to weather, "asked if she wanted to be transferred  To reschedule with Dr.Squire, "yes please", transferred call to Uvaldo Bristle, will infome MD 10:05 AM

## 2013-08-15 ENCOUNTER — Encounter (HOSPITAL_COMMUNITY)
Admission: RE | Admit: 2013-08-15 | Discharge: 2013-08-15 | Disposition: A | Discharge status: Home / Self Care | Payer: Medicare Other | Source: Ambulatory Visit | Attending: Internal Medicine | Admitting: Internal Medicine

## 2013-08-15 ENCOUNTER — Encounter (HOSPITAL_COMMUNITY): Payer: Self-pay

## 2013-08-15 VITALS — BP 132/68 | HR 76 | Temp 98.1°F | Resp 20

## 2013-08-15 DIAGNOSIS — D509 Iron deficiency anemia, unspecified: Secondary | ICD-10-CM

## 2013-08-15 MED ORDER — FERUMOXYTOL INJECTION 510 MG/17 ML
510.0000 mg | Freq: Once | INTRAVENOUS | Status: AC
  Administered 2013-08-15: 510 mg via INTRAVENOUS
  Filled 2013-08-15: qty 17

## 2013-08-15 MED ORDER — SODIUM CHLORIDE 0.9 % IV SOLN
INTRAVENOUS | Status: DC
  Administered 2013-08-15: 20 mL/h via INTRAVENOUS

## 2013-08-15 NOTE — Discharge Instructions (Signed)
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
## 2013-08-20 ENCOUNTER — Other Ambulatory Visit: Payer: Self-pay | Admitting: *Deleted

## 2013-08-20 DIAGNOSIS — C50911 Malignant neoplasm of unspecified site of right female breast: Secondary | ICD-10-CM

## 2013-08-21 ENCOUNTER — Encounter: Payer: Self-pay | Admitting: Oncology

## 2013-08-21 ENCOUNTER — Encounter: Payer: Self-pay | Admitting: *Deleted

## 2013-08-21 ENCOUNTER — Other Ambulatory Visit: Payer: Self-pay | Admitting: *Deleted

## 2013-08-21 ENCOUNTER — Ambulatory Visit: Payer: Medicare Other

## 2013-08-21 ENCOUNTER — Telehealth: Payer: Self-pay | Admitting: *Deleted

## 2013-08-21 ENCOUNTER — Ambulatory Visit (HOSPITAL_BASED_OUTPATIENT_CLINIC_OR_DEPARTMENT_OTHER): Payer: Medicare Other | Admitting: Oncology

## 2013-08-21 ENCOUNTER — Other Ambulatory Visit (HOSPITAL_BASED_OUTPATIENT_CLINIC_OR_DEPARTMENT_OTHER): Payer: Medicare Other

## 2013-08-21 VITALS — BP 137/84 | HR 92 | Temp 98.1°F | Resp 18 | Ht 65.0 in | Wt 201.4 lb

## 2013-08-21 DIAGNOSIS — C4360 Malignant melanoma of unspecified upper limb, including shoulder: Secondary | ICD-10-CM

## 2013-08-21 DIAGNOSIS — C50919 Malignant neoplasm of unspecified site of unspecified female breast: Secondary | ICD-10-CM

## 2013-08-21 DIAGNOSIS — C50319 Malignant neoplasm of lower-inner quadrant of unspecified female breast: Secondary | ICD-10-CM

## 2013-08-21 DIAGNOSIS — Z170 Estrogen receptor positive status [ER+]: Secondary | ICD-10-CM

## 2013-08-21 DIAGNOSIS — C50911 Malignant neoplasm of unspecified site of right female breast: Secondary | ICD-10-CM

## 2013-08-21 LAB — COMPREHENSIVE METABOLIC PANEL (CC13)
ALBUMIN: 3.6 g/dL (ref 3.5–5.0)
ALT: 14 U/L (ref 0–55)
ANION GAP: 9 meq/L (ref 3–11)
AST: 19 U/L (ref 5–34)
Alkaline Phosphatase: 90 U/L (ref 40–150)
BUN: 14.4 mg/dL (ref 7.0–26.0)
CALCIUM: 10.1 mg/dL (ref 8.4–10.4)
CHLORIDE: 107 meq/L (ref 98–109)
CO2: 28 meq/L (ref 22–29)
Creatinine: 0.8 mg/dL (ref 0.6–1.1)
Glucose: 100 mg/dl (ref 70–140)
Potassium: 3.8 mEq/L (ref 3.5–5.1)
SODIUM: 143 meq/L (ref 136–145)
TOTAL PROTEIN: 6.6 g/dL (ref 6.4–8.3)
Total Bilirubin: 0.43 mg/dL (ref 0.20–1.20)

## 2013-08-21 LAB — CBC WITH DIFFERENTIAL/PLATELET
BASO%: 0.7 % (ref 0.0–2.0)
Basophils Absolute: 0.1 10*3/uL (ref 0.0–0.1)
EOS%: 2.8 % (ref 0.0–7.0)
Eosinophils Absolute: 0.2 10*3/uL (ref 0.0–0.5)
HEMATOCRIT: 31.8 % — AB (ref 34.8–46.6)
HGB: 9.5 g/dL — ABNORMAL LOW (ref 11.6–15.9)
LYMPH#: 1.7 10*3/uL (ref 0.9–3.3)
LYMPH%: 18.6 % (ref 14.0–49.7)
MCH: 22.2 pg — AB (ref 25.1–34.0)
MCHC: 29.8 g/dL — ABNORMAL LOW (ref 31.5–36.0)
MCV: 74.6 fL — ABNORMAL LOW (ref 79.5–101.0)
MONO#: 0.8 10*3/uL (ref 0.1–0.9)
MONO%: 9.2 % (ref 0.0–14.0)
NEUT#: 6.1 10*3/uL (ref 1.5–6.5)
NEUT%: 68.7 % (ref 38.4–76.8)
Platelets: 402 10*3/uL — ABNORMAL HIGH (ref 145–400)
RBC: 4.26 10*6/uL (ref 3.70–5.45)
RDW: 35.8 % — AB (ref 11.2–14.5)
WBC: 8.9 10*3/uL (ref 3.9–10.3)
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
## 2013-08-21 NOTE — Telephone Encounter (Signed)
appts made and printed...td 

## 2013-08-21 NOTE — Patient Instructions (Signed)
We will send oncotype Dx   I will see you back in 2-3 weeks for follow up to discuss the resultsof Oncotype

## 2013-08-21 NOTE — Telephone Encounter (Signed)
Message copied by Hulan Saas on Tue Aug 21, 2013 11:44 AM ------      Message from: Lafayette Dragon      Created: Mon Aug 20, 2013  3:12 PM       The next step is to follow CBC ovewr 3 months, having CBC every 4 weeks x3, then decide if SBCE indicated.      ----- Message -----         From: Hulan Saas, RN         Sent: 08/20/2013  11:19 AM           To: Lafayette Dragon, MD            Dr. Olevia Perches,      When I spoke with Ms. Cottier several weeks ago, she wanted to have her CT before scheduling SBCE. She has had the CT scan. Does she need SBCE?       Iasha Mccalister       ------

## 2013-08-21 NOTE — Progress Notes (Signed)
Checked in new patient with no financial issues. She has appt card and [facility] form.  °

## 2013-08-21 NOTE — Telephone Encounter (Signed)
Left a message for patient to call me. 

## 2013-08-21 NOTE — Progress Notes (Signed)
Per Dr. Humphrey Rolls Oncotype Dx ordered.  Requisition sent to pathology.  Received by Tammy.

## 2013-08-21 NOTE — Telephone Encounter (Signed)
Spoke with patient and gave her Dr. Nichola Sizer recommendation for lab work. She states she is going to the cancer center and will have labs forwarded to [place]. She is scheduled to see Dr. Olevia Perches on 08/24/13 and is asking if she needs to keep this OV. Please, advise.

## 2013-08-22 ENCOUNTER — Ambulatory Visit
Admission: RE | Admit: 2013-08-22 | Discharge: 2013-08-22 | Disposition: A | Discharge status: Home / Self Care | Payer: Medicare Other | Source: Ambulatory Visit | Attending: Radiation Oncology | Admitting: Radiation Oncology

## 2013-08-22 ENCOUNTER — Encounter: Payer: Self-pay | Admitting: Radiation Oncology

## 2013-08-22 VITALS — BP 129/80 | HR 98 | Ht 65.0 in | Wt 202.5 lb

## 2013-08-22 DIAGNOSIS — E7800 Pure hypercholesterolemia, unspecified: Secondary | ICD-10-CM | POA: Insufficient documentation

## 2013-08-22 DIAGNOSIS — F3289 Other specified depressive episodes: Secondary | ICD-10-CM | POA: Insufficient documentation

## 2013-08-22 DIAGNOSIS — E559 Vitamin D deficiency, unspecified: Secondary | ICD-10-CM | POA: Insufficient documentation

## 2013-08-22 DIAGNOSIS — Z79899 Other long term (current) drug therapy: Secondary | ICD-10-CM | POA: Insufficient documentation

## 2013-08-22 DIAGNOSIS — K219 Gastro-esophageal reflux disease without esophagitis: Secondary | ICD-10-CM | POA: Insufficient documentation

## 2013-08-22 DIAGNOSIS — C50219 Malignant neoplasm of upper-inner quadrant of unspecified female breast: Secondary | ICD-10-CM

## 2013-08-22 DIAGNOSIS — F329 Major depressive disorder, single episode, unspecified: Secondary | ICD-10-CM | POA: Insufficient documentation

## 2013-08-22 DIAGNOSIS — C50919 Malignant neoplasm of unspecified site of unspecified female breast: Secondary | ICD-10-CM | POA: Insufficient documentation

## 2013-08-22 DIAGNOSIS — I10 Essential (primary) hypertension: Secondary | ICD-10-CM | POA: Insufficient documentation

## 2013-08-22 DIAGNOSIS — C50211 Malignant neoplasm of upper-inner quadrant of right female breast: Secondary | ICD-10-CM

## 2013-08-22 DIAGNOSIS — Z9010 Acquired absence of unspecified breast and nipple: Secondary | ICD-10-CM | POA: Insufficient documentation

## 2013-08-22 DIAGNOSIS — C50911 Malignant neoplasm of unspecified site of right female breast: Secondary | ICD-10-CM

## 2013-08-22 DIAGNOSIS — IMO0001 Reserved for inherently not codable concepts without codable children: Secondary | ICD-10-CM | POA: Insufficient documentation

## 2013-08-23 ENCOUNTER — Ambulatory Visit (INDEPENDENT_AMBULATORY_CARE_PROVIDER_SITE_OTHER): Payer: Medicare Other | Admitting: Surgery

## 2013-08-23 ENCOUNTER — Encounter (INDEPENDENT_AMBULATORY_CARE_PROVIDER_SITE_OTHER): Payer: Self-pay | Admitting: Surgery

## 2013-08-23 VITALS — BP 128/78 | HR 80 | Temp 97.9°F | Resp 16 | Ht 65.0 in | Wt 201.0 lb

## 2013-08-23 DIAGNOSIS — C50919 Malignant neoplasm of unspecified site of unspecified female breast: Secondary | ICD-10-CM

## 2013-08-23 DIAGNOSIS — C4360 Malignant melanoma of unspecified upper limb, including shoulder: Secondary | ICD-10-CM

## 2013-08-23 DIAGNOSIS — C50911 Malignant neoplasm of unspecified site of right female breast: Secondary | ICD-10-CM

## 2013-08-23 DIAGNOSIS — C4361 Malignant melanoma of right upper limb, including shoulder: Secondary | ICD-10-CM | POA: Insufficient documentation

## 2013-08-23 NOTE — Addendum Note (Signed)
Encounter addended by: Deirdre Evener, RN on: 08/23/2013  1:21 PM<BR>     Documentation filed: Charges VN
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
## 2013-08-24 ENCOUNTER — Ambulatory Visit (INDEPENDENT_AMBULATORY_CARE_PROVIDER_SITE_OTHER): Payer: Medicare Other | Admitting: Internal Medicine

## 2013-08-24 ENCOUNTER — Encounter: Payer: Self-pay | Admitting: Internal Medicine

## 2013-08-24 VITALS — BP 140/80 | HR 92 | Ht 65.0 in | Wt 201.0 lb

## 2013-08-24 DIAGNOSIS — D509 Iron deficiency anemia, unspecified: Secondary | ICD-10-CM
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
## 2013-08-24 NOTE — Patient Instructions (Signed)
Dr Humphrey Rolls, Dr Isidore Moos,

## 2013-09-03 ENCOUNTER — Encounter: Payer: Self-pay | Admitting: *Deleted

## 2013-09-03 ENCOUNTER — Encounter (HOSPITAL_COMMUNITY): Payer: Self-pay

## 2013-09-03 NOTE — Progress Notes (Signed)
Report rcvd from [place] 3/13.  Provided to [place].

## 2013-09-03 NOTE — Progress Notes (Signed)
Received Oncotype Dx results of 11.  Took a copy to Med Rec to scan and placed a copy in Dr. Laurelyn Sickle box.

## 2013-09-05 NOTE — Addendum Note (Signed)
Encounter addended by: Justen Fonda Mintz Venesa Semidey, RN on: 09/05/2013 10:38 AM<BR>     Documentation filed: Charges VN

## 2013-09-10 ENCOUNTER — Telehealth: Payer: Self-pay

## 2013-09-10 NOTE — Telephone Encounter (Signed)
LMOVM results are in for appt tomorrow.

## 2013-09-11 ENCOUNTER — Telehealth: Payer: Self-pay | Admitting: Oncology

## 2013-09-11 ENCOUNTER — Encounter: Payer: Self-pay | Admitting: Oncology

## 2013-09-11 ENCOUNTER — Ambulatory Visit (HOSPITAL_BASED_OUTPATIENT_CLINIC_OR_DEPARTMENT_OTHER): Payer: Medicare Other | Admitting: Oncology

## 2013-09-11 VITALS — BP 145/80 | HR 78 | Temp 97.7°F | Resp 18 | Ht 65.0 in | Wt 198.0 lb

## 2013-09-11 DIAGNOSIS — C50319 Malignant neoplasm of lower-inner quadrant of unspecified female breast: Secondary | ICD-10-CM

## 2013-09-11 DIAGNOSIS — C4360 Malignant melanoma of unspecified upper limb, including shoulder: Secondary | ICD-10-CM

## 2013-09-11 DIAGNOSIS — D509 Iron deficiency anemia, unspecified: Secondary | ICD-10-CM

## 2013-09-11 DIAGNOSIS — Z170 Estrogen receptor positive status [ER+]: Secondary | ICD-10-CM

## 2013-09-11 DIAGNOSIS — C50911 Malignant neoplasm of unspecified site of right female breast: Secondary | ICD-10-CM

## 2013-09-11 NOTE — Progress Notes (Signed)
[facility] OFFICE PROGRESS NOTE  Patient Care Team: Kelton Pillar, MD as PCP - General (Family Medicine) Christene Slates as Physician Assistant (Radiology) Lovenia Kim, MD as Consulting Physician (Obstetrics and Gynecology) Lafayette Dragon, MD as Consulting Physician (Gastroenterology) Deatra Robinson, MD as Consulting Physician (Oncology) Shann Medal, MD as Consulting Physician (General Surgery) Eppie Gibson, MD as Consulting Physician (Radiation Oncology) Shann Medal, MD as Consulting Physician (General Surgery)  DIAGNOSIS: 73 year old female with new diagnosis of T1 N0 invasive lobular carcinoma of the right breast status post lumpectomy on 08/01/2013.  STAGE:  Breast cancer, right breast  Primary site: Breast (Right)  Staging method: AJCC 7th Edition  Clinical: (T1, N0)  Pathologic: Stage IA (T1c, N0, cM0) signed by Deatra Robinson, MD on 08/26/2013 2:40 PM  Summary: Stage IA (T1c, N0, cM0)  SUMMARY OF ONCOLOGIC HISTORY: #1 06/19/2013 patient had a screening mammogram that showed a new irregular right breast mass at the 4:00 position. Biopsy revealed invasive mammary carcinoma consistent with a lobular phenotype. Every 2 2015 MRI of the breasts performed in the right breast she was found to have a 1.9 cm upper inner quadrant mass but no left breast lesions or abnormal lymph nodes.  #2She saw Dr. Alphonsa Overall and underwent a right lumpectomy with sentinel lymph node biopsy and right rest implant removal on 07/31/2013. The final pathology revealed 1.8 cm lobular carcinoma 4 sentinel nodes were negative for metastatic disease all margins were negative. The tumor was ER +100% PR +80% HER-2/neu negative with a proliferation marker Ki-67 10%.  #3 melanoma: Patient also was found to have a right arm melanotic lesion. This was biopsied and it was a melanoma. She needs a reexcision. This was Clark level II Breslow depth 0.4 mm.  #4 Oncotype DX testing: Performed on the  breast cancer. Her Oncotype score is 11 giving her a 7% risk of distant recurrence in 10 years with tamoxifen only. We discussed this in detail. She will begin antiestrogen therapy after her radiation.  CURRENT THERAPY: Refer to radiation oncology.  INTERVAL HISTORY: [name] 73 y.o. female returns for followup visit and to discuss the Oncotype testing results. We went over this in detail. Her breast cancer recurrence score was 11 giving her a 7% risk of distant recurrence in 10 years with 5 years of tamoxifen. We discussed tamoxifen treatment adjuvantly as well as aromatase inhibitors. I do think that she would be a candidate for tamoxifen more so than the aromatase inhibitors. She tells me that she does have osteopenia on her last bone density that was performed at [facility]. We do not have the results of this and we will need those prior to making any treatment decisions.   I have reviewed the past medical history, past surgical history, social history and family history with the patient and they are unchanged from previous note.  ALLERGIES:  has No Known Allergies.  MEDICATIONS:  Current Outpatient Prescriptions  Medication Sig Dispense Refill  . Ascorbic Acid (VITAMIN C) 100 MG tablet Take 100 mg by mouth daily. chewable      . CALCIUM PO Take 1 capsule by mouth 2 (two) times daily. 1559m twice a day      . cholecalciferol (VITAMIN D) 1000 UNITS tablet Take 1,000 Units by mouth 2 (two) times daily. D#3 type      . ergocalciferol (VITAMIN D2) 50000 UNITS capsule Take 50,000 Units by mouth once a week.      . iron  polysaccharides (NIFEREX) 150 MG capsule Take 150 mg by mouth daily.      Javier Docker Oil 1000 MG CAPS Take 1 capsule by mouth daily.      Marland Kitchen lisinopril (PRINIVIL,ZESTRIL) 20 MG tablet Take 20 mg by mouth daily.      . Multiple Vitamin (MULTIVITAMIN) tablet Take 1 tablet by mouth 2 (two) times daily.       . Omega-3 Fatty Acids (FISH OIL) 1000 MG CAPS Take 1 capsule by mouth 2  (two) times daily.      . Probiotic Product (PHILLIPS COLON HEALTH PO) Take 1 capsule by mouth daily.      . ranitidine (ZANTAC) 150 MG tablet Take 1 tablet (150 mg total) by mouth at bedtime.  30 tablet  2  . vitamin E 100 UNIT capsule Take 100 Units by mouth daily.       No current facility-administered medications for this visit.    REVIEW OF SYSTEMS:   Constitutional: Denies fevers, chills or abnormal weight loss Eyes: Denies blurriness of vision Ears, nose, mouth, throat, and face: Denies mucositis or sore throat Respiratory: Denies cough, dyspnea or wheezes Cardiovascular: Denies palpitation, chest discomfort or lower extremity swelling Gastrointestinal:  Denies nausea, heartburn or change in bowel habits Skin: Denies abnormal skin rashes Lymphatics: Denies new lymphadenopathy or easy bruising Neurological:Denies numbness, tingling or new weaknesses Behavioral/Psych: Mood is stable, no new changes  All other systems were reviewed with the patient and are negative.  PHYSICAL EXAMINATION: ECOG PERFORMANCE STATUS: 0 - Asymptomatic  Filed Vitals:   09/11/13 1312  BP: 145/80  Pulse: 78  Temp: 97.7 F (36.5 C)  Resp: 18   Filed Weights   09/11/13 1312  Weight: 198 lb (89.812 kg)    physical exam was not performed today  LABORATORY DATA:  I have reviewed the data as listed    Component Value Date/Time   NA 143 08/21/2013 1509   NA 144 07/26/2013 1042   K 3.8 08/21/2013 1509   K 4.5 07/26/2013 1042   CL 105 07/26/2013 1042   CO2 28 08/21/2013 1509   CO2 26 07/26/2013 1042   GLUCOSE 100 08/21/2013 1509   GLUCOSE 111* 07/26/2013 1042   BUN 14.4 08/21/2013 1509   BUN 22 07/26/2013 1042   CREATININE 0.8 08/21/2013 1509   CREATININE 0.78 07/26/2013 1042   CALCIUM 10.1 08/21/2013 1509   CALCIUM 9.6 07/26/2013 1042   PROT 6.6 08/21/2013 1509   PROT 6.3 12/27/2007 2335   ALBUMIN 3.6 08/21/2013 1509   ALBUMIN 3.9 12/27/2007 2335   AST 19 08/21/2013 1509   AST 20 12/27/2007 2335   ALT 14 08/21/2013 1509   ALT  14 12/27/2007 2335   ALKPHOS 90 08/21/2013 1509   ALKPHOS 69 12/27/2007 2335   BILITOT 0.43 08/21/2013 1509   BILITOT 1.3* 12/27/2007 2335   GFRNONAA 82* 07/26/2013 1042   GFRAA >90 07/26/2013 1042    No results found for this basename: SPEP, UPEP,  kappa and lambda light chains    Lab Results  Component Value Date   WBC 8.9 08/21/2013   NEUTROABS 6.1 08/21/2013   HGB 9.5* 08/21/2013   HCT 31.8* 08/21/2013   MCV 74.6* 08/21/2013   PLT 402* 08/21/2013      Chemistry      Component Value Date/Time   NA 143 08/21/2013 1509   NA 144 07/26/2013 1042   K 3.8 08/21/2013 1509   K 4.5 07/26/2013 1042   CL 105 07/26/2013 1042  CO2 28 08/21/2013 1509   CO2 26 07/26/2013 1042   BUN 14.4 08/21/2013 1509   BUN 22 07/26/2013 1042   CREATININE 0.8 08/21/2013 1509   CREATININE 0.78 07/26/2013 1042      Component Value Date/Time   CALCIUM 10.1 08/21/2013 1509   CALCIUM 9.6 07/26/2013 1042   ALKPHOS 90 08/21/2013 1509   ALKPHOS 69 12/27/2007 2335   AST 19 08/21/2013 1509   AST 20 12/27/2007 2335   ALT 14 08/21/2013 1509   ALT 14 12/27/2007 2335   BILITOT 0.43 08/21/2013 1509   BILITOT 1.3* 12/27/2007 2335       RADIOGRAPHIC STUDIES: I have personally reviewed the radiological images as listed and agreed with the findings in the report. No results found.    ASSESSMENT & PLAN:   73 year old female with  #1 stage I (T1 N0) invasive lobular carcinoma of the right breast. She is status post lumpectomy with sentinel lymph node biopsy with the final pathology revealing a 1.8 cm, grade 2, ER positive PR positive HER-2/neu and negative with 0 of 4 lymph nodes positive for metastatic disease. Patient has also had Oncotype DX testing performed she is in the low risk category (recurrence score 11). She does not need chemotherapy based on this score. I have recommended that she proceed with radiation therapy. She will require adjuvant antiestrogen therapy. We discussed different therapies including tamoxifen as well as aromatase inhibitors. Because she  does have what sounds like history of osteopenia and she will most likely seek tamoxifen.  #2 iron deficiency anemia: Patient has iron deficiency of unknown cause likely absorption issues. She's had workup performed by Dr. Delfin Edis. She is also received IV iron in the past. She is on oral iron currently. I have encouraged her to continue taking this. We will plan on having iron studies performed again in about 2-3 months time. Certainly if she has not replenish her stores then she will need parenteral iron.  #3 patient will be referred to radiation oncology I put in a referral to Dr. Eppie Gibson and sent her a message.  #4 melanoma: Patient will need a reexcision I do think she is ready for this now. I have sent a message to Dr. Alphonsa Overall.  #5 patient will be seen back after completion of radiation therapy.  No orders of the defined types were placed in this encounter.   All questions were answered. The patient knows to call the clinic with any problems, questions or concerns. No barriers to learning was detected. I spent 25 minutes counseling the patient face to face. The total time spent in the appointment was 30 minutes and more than 50% was on counseling and review of test results     Marcy Panning, MD 09/11/2013 1:49 PM

## 2013-09-11 NOTE — Telephone Encounter (Signed)
Pt is aware that we will contact her her with the 2 mnth f/u appt.  S/w rhonda regarding the rad onc referral with dr Isidore Moos for an appt.

## 2013-09-12 ENCOUNTER — Encounter: Payer: Self-pay | Admitting: Radiation Oncology

## 2013-09-12 ENCOUNTER — Telehealth: Payer: Self-pay | Admitting: *Deleted

## 2013-09-12 NOTE — Progress Notes (Signed)
Oncotype DX testing: Performed on the breast cancer. Her Oncotype score is 11 giving her a 7% risk of distant recurrence in 10 years with tamoxifen only. We discussed this in detail. She will begin antiestrogen therapy after her radiation.

## 2013-09-12 NOTE — Telephone Encounter (Signed)
Per 3/24 POF, waiting on a date and time with provider from Dr. Chancy Milroy. No opening available

## 2013-09-13 ENCOUNTER — Telehealth: Payer: Self-pay | Admitting: *Deleted

## 2013-09-13 NOTE — Telephone Encounter (Signed)
Per 3/25 POF I have tried to schedule appts. No available. I have sent MD message to advise with a date/time.

## 2013-09-14 ENCOUNTER — Ambulatory Visit
Admission: RE | Admit: 2013-09-14 | Discharge: 2013-09-14 | Disposition: A | Discharge status: Home / Self Care | Payer: Medicare Other | Source: Ambulatory Visit | Attending: Radiation Oncology | Admitting: Radiation Oncology

## 2013-09-14 ENCOUNTER — Ambulatory Visit: Payer: Medicare Other

## 2013-09-14 DIAGNOSIS — C50219 Malignant neoplasm of upper-inner quadrant of unspecified female breast: Secondary | ICD-10-CM | POA: Insufficient documentation

## 2013-09-14 DIAGNOSIS — M7989 Other specified soft tissue disorders: Secondary | ICD-10-CM | POA: Insufficient documentation

## 2013-09-14 DIAGNOSIS — C50211 Malignant neoplasm of upper-inner quadrant of right female breast: Secondary | ICD-10-CM

## 2013-09-14 DIAGNOSIS — C4360 Malignant melanoma of unspecified upper limb, including shoulder: Secondary | ICD-10-CM | POA: Insufficient documentation

## 2013-09-14 DIAGNOSIS — Z510 Encounter for antineoplastic radiation therapy: Secondary | ICD-10-CM | POA: Insufficient documentation

## 2013-09-14 HISTORY — DX: Iron deficiency anemia, unspecified: D50.9

## 2013-09-14 NOTE — Progress Notes (Addendum)
SIMULATION AND TREATMENT PLANNING NOTE  Outpatient  DIAGNOSIS:  Right breast cancer  NARRATIVE:  The patient was brought to the [facility].  Identity was confirmed.  All relevant records and images related to the planned course of therapy were reviewed.  The patient freely provided informed written consent to proceed with treatment after reviewing the details related to the planned course of therapy. The consent form was witnessed and verified by the simulation staff.    Then, the patient was set-up in a stable reproducible  supine position for radiation therapy -right arm raised into vaclock.  CT images were obtained.  Surface markings were placed.  The CT images were loaded into the planning software.     RESPIRATORY MOTION MANAGEMENT SIMULATION  NARRATIVE:  In order to account for effect of respiratory motion on target structures and other organs in the planning and delivery of radiotherapy, this patient underwent respiratory motion management simulation.  To accomplish this, when the patient was brought to the CT simulation planning suite, 4D respiratory motion management CT images were obtained.  The CT images were loaded into the planning software.  Then, using a variety of tools including Cine, MIP, and standard views, the target volume was delineated.  Avoidance structures were contoured.  It was determined that the breathhold technique will maximize sparing of the heart from radiation dose.  TREATMENT PLANNING NOTE: Treatment planning then occurred.  The radiation prescription was entered and confirmed.    A total of 7 medically necessary complex treatment devices were fabricated and supervised by me - 2 tangential fields with MLCs to block lung/heart, and vaclock device, and 4 field in fields with MLCs for dose homoegeneity - these MLCs also block lung/heart. I have requested : 3D Simulation.  I have requested a DVH of the following structures: lungs, heart, lumpectomy  cavity.     The patient will receive 42.56 Gy in 16 fractions to the right breast with tangential fields- no boost.  Optical Surface Tracking Plan:  Since intensity modulated radiotherapy (IMRT) and 3D conformal radiation treatment methods are predicated on accurate and precise positioning for treatment, intrafraction motion monitoring is medically necessary to ensure accurate and safe treatment delivery. The ability to quantify intrafraction motion without excessive ionizing radiation dose can only be performed with optical surface tracking. Accordingly, surface imaging offers the opportunity to obtain 3D measurements of patient position throughout IMRT and 3D treatments without excessive radiation exposure. I am ordering optical surface tracking for this patient's upcoming course of radiotherapy.  ________________________________   Reference:  Ursula Alert, J, et al. Surface imaging-based analysis of intrafraction motion for breast radiotherapy patients.Journal of [place], n. 6, nov. 2014. ISSN [id number].  Available at: <http://www.jacmp.org/index.php/jacmp/article/view/4957>.   -----------------------------------  Eppie Gibson, MD

## 2013-09-17 ENCOUNTER — Telehealth: Payer: Self-pay | Admitting: *Deleted

## 2013-09-17 ENCOUNTER — Encounter (INDEPENDENT_AMBULATORY_CARE_PROVIDER_SITE_OTHER): Payer: Medicare Other | Admitting: Surgery

## 2013-09-17 NOTE — Telephone Encounter (Signed)
Per staff message from MD  and POF I have scheduled appts. Calendar mailed and patient called. JMW

## 2013-09-21 ENCOUNTER — Ambulatory Visit
Admission: RE | Admit: 2013-09-21 | Discharge: 2013-09-21 | Disposition: A | Discharge status: Home / Self Care | Payer: Medicare Other | Source: Ambulatory Visit | Attending: Radiation Oncology | Admitting: Radiation Oncology

## 2013-09-21 DIAGNOSIS — C50219 Malignant neoplasm of upper-inner quadrant of unspecified female breast: Secondary | ICD-10-CM

## 2013-09-21 NOTE — Progress Notes (Signed)
Simulation Verification Note Right breast outpatient  The patient was brought to the treatment unit and placed in the planned treatment position. The clinical setup was verified. Then port films were obtained and uploaded to the radiation oncology medical record software.  The treatment beams were carefully compared against the planned radiation fields. The position location and shape of the radiation fields was reviewed. They targeted volume of tissue appears to be appropriately covered by the radiation beams. Organs at risk appear to be excluded as planned.  Based on my personal review, I approved the simulation verification. The patient's treatment will proceed as planned.  -----------------------------------  Eppie Gibson, MD

## 2013-09-24 ENCOUNTER — Ambulatory Visit
Admission: RE | Admit: 2013-09-24 | Discharge: 2013-09-24 | Disposition: A | Discharge status: Home / Self Care | Payer: Medicare Other | Source: Ambulatory Visit | Attending: Radiation Oncology | Admitting: Radiation Oncology

## 2013-09-25 ENCOUNTER — Inpatient Hospital Stay: Admission: RE | Admit: 2013-09-25 | Payer: Medicare Other | Source: Ambulatory Visit | Admitting: Radiation Oncology

## 2013-09-25 ENCOUNTER — Ambulatory Visit
Admission: RE | Admit: 2013-09-25 | Discharge: 2013-09-25 | Disposition: A | Discharge status: Home / Self Care | Payer: Medicare Other | Source: Ambulatory Visit | Attending: Radiation Oncology | Admitting: Radiation Oncology

## 2013-09-26 ENCOUNTER — Encounter: Payer: Self-pay | Admitting: Radiation Oncology

## 2013-09-26 ENCOUNTER — Ambulatory Visit
Admission: RE | Admit: 2013-09-26 | Discharge: 2013-09-26 | Disposition: A | Discharge status: Home / Self Care | Payer: Medicare Other | Source: Ambulatory Visit | Attending: Radiation Oncology | Admitting: Radiation Oncology

## 2013-09-26 ENCOUNTER — Inpatient Hospital Stay: Admission: RE | Admit: 2013-09-26 | Payer: Medicare Other | Source: Ambulatory Visit | Admitting: Radiation Oncology

## 2013-09-26 VITALS — BP 142/71 | HR 80 | Resp 16 | Wt 202.0 lb

## 2013-09-26 DIAGNOSIS — C50911 Malignant neoplasm of unspecified site of right female breast: Secondary | ICD-10-CM

## 2013-09-26 DIAGNOSIS — C50219 Malignant neoplasm of upper-inner quadrant of unspecified female breast: Secondary | ICD-10-CM

## 2013-09-26 MED ORDER — RADIAPLEXRX EX GEL
Freq: Once | CUTANEOUS | Status: AC
  Administered 2013-09-26: 18:00:00 via TOPICAL

## 2013-09-26 MED ORDER — ALRA NON-METALLIC DEODORANT (RAD-ONC)
1.0000 "application " | Freq: Once | TOPICAL | Status: AC
  Administered 2013-09-26: 1 via TOPICAL

## 2013-09-26 NOTE — Progress Notes (Addendum)
Patient reports that she has noticed the nipple of her treated breast is slightly inverted. Denies breast pain, skin changes or fatigue. Completed post sim education with the patient. Oriented patient to staff and routine of the clinic. Provided patient with RADIATION THERAPY AND YOU handbook then, reviewed pertinent information. Provided patient with radiaplex and alra then, directed upon used. Educated patient reference potential side effects and management such as, fatigue and skin changes. Allow patient opportunity to ask questions. Patient verbalized understanding of all reviewed.

## 2013-09-26 NOTE — Progress Notes (Signed)
   Weekly Management Note:  outpatient Current fraction:  3  Projected # of fractions: 16  Narrative:  The patient presents for routine under treatment assessment.  CBCT/MVCT images/Port film x-rays were reviewed.  The chart was checked. Nipple more inverted on right since starting RT  Physical Findings:  weight is 202 lb (91.627 kg). Her blood pressure is 142/71 and her pulse is 80. Her respiration is 16.  nipple inversion, right breast. Faint erythema of breast. No sign of infection  Impression:  The patient is tolerating radiotherapy.  Plan:  Continue radiotherapy as planned. Nipple inversion consistent with post-operative changes and possible also some early swelling related to RT.  ________________________________   Eppie Gibson, M.D.

## 2013-09-27 ENCOUNTER — Ambulatory Visit (INDEPENDENT_AMBULATORY_CARE_PROVIDER_SITE_OTHER): Payer: Medicare Other | Admitting: Surgery

## 2013-09-27 ENCOUNTER — Encounter (INDEPENDENT_AMBULATORY_CARE_PROVIDER_SITE_OTHER): Payer: Self-pay | Admitting: Surgery

## 2013-09-27 ENCOUNTER — Ambulatory Visit
Admission: RE | Admit: 2013-09-27 | Discharge: 2013-09-27 | Disposition: A | Discharge status: Home / Self Care | Payer: Medicare Other | Source: Ambulatory Visit | Attending: Radiation Oncology | Admitting: Radiation Oncology

## 2013-09-27 VITALS — BP 126/80 | HR 76 | Ht 65.0 in | Wt 202.4 lb

## 2013-09-27 DIAGNOSIS — C4360 Malignant melanoma of unspecified upper limb, including shoulder: Secondary | ICD-10-CM

## 2013-09-27 DIAGNOSIS — C50919 Malignant neoplasm of unspecified site of unspecified female breast: Secondary | ICD-10-CM

## 2013-09-27 DIAGNOSIS — C50911 Malignant neoplasm of unspecified site of right female breast: Secondary | ICD-10-CM

## 2013-09-27 DIAGNOSIS — C4361 Malignant melanoma of right upper limb, including shoulder: Secondary | ICD-10-CM
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
## 2013-09-28 ENCOUNTER — Ambulatory Visit
Admission: RE | Admit: 2013-09-28 | Discharge: 2013-09-28 | Disposition: A | Discharge status: Home / Self Care | Payer: Medicare Other | Source: Ambulatory Visit | Attending: Radiation Oncology | Admitting: Radiation Oncology

## 2013-10-01 ENCOUNTER — Ambulatory Visit
Admission: RE | Admit: 2013-10-01 | Discharge: 2013-10-01 | Disposition: A | Discharge status: Home / Self Care | Payer: Medicare Other | Source: Ambulatory Visit | Attending: Radiation Oncology | Admitting: Radiation Oncology

## 2013-10-01 ENCOUNTER — Encounter: Payer: Self-pay | Admitting: Radiation Oncology

## 2013-10-01 VITALS — BP 153/78 | HR 95 | Temp 98.2°F | Ht 65.0 in | Wt 200.0 lb

## 2013-10-01 DIAGNOSIS — C50219 Malignant neoplasm of upper-inner quadrant of unspecified female breast: Secondary | ICD-10-CM

## 2013-10-01 NOTE — Progress Notes (Signed)
   Weekly Management Note:  outpatient Current Dose:  15.96 Gy  Projected Dose: 42.56 Gy   Narrative:  The patient presents for routine under treatment assessment.  CBCT/MVCT images/Port film x-rays were reviewed.  The chart was checked. No new complaints  Physical Findings:  height is 5\' 5"  (1.651 m) and weight is 200 lb (90.719 kg). Her temperature is 98.2 F (36.8 C). Her blood pressure is 153/78 and her pulse is 95.  mild erythema, right breast  Impression:  The patient is tolerating radiotherapy.  Plan:  Continue radiotherapy as planned.   ________________________________   Eppie Gibson, M.D.

## 2013-10-01 NOTE — Progress Notes (Addendum)
Ms. Dobosz has received 6 fractions to her right breast.  Note mild erythema of breast with reported tenderness near the axilla and nipple region.  Skin intact and soft

## 2013-10-02 ENCOUNTER — Ambulatory Visit: Payer: Medicare Other

## 2013-10-03 ENCOUNTER — Ambulatory Visit
Admission: RE | Admit: 2013-10-03 | Discharge: 2013-10-03 | Disposition: A | Discharge status: Home / Self Care | Payer: Medicare Other | Source: Ambulatory Visit | Attending: Radiation Oncology | Admitting: Radiation Oncology

## 2013-10-04 ENCOUNTER — Ambulatory Visit
Admission: RE | Admit: 2013-10-04 | Discharge: 2013-10-04 | Disposition: A | Discharge status: Home / Self Care | Payer: Medicare Other | Source: Ambulatory Visit | Attending: Radiation Oncology | Admitting: Radiation Oncology

## 2013-10-05 ENCOUNTER — Ambulatory Visit
Admission: RE | Admit: 2013-10-05 | Discharge: 2013-10-05 | Disposition: A | Discharge status: Home / Self Care | Payer: Medicare Other | Source: Ambulatory Visit | Attending: Radiation Oncology | Admitting: Radiation Oncology

## 2013-10-08 ENCOUNTER — Ambulatory Visit
Admission: RE | Admit: 2013-10-08 | Discharge: 2013-10-08 | Disposition: A | Discharge status: Home / Self Care | Payer: Medicare Other | Source: Ambulatory Visit | Attending: Radiation Oncology | Admitting: Radiation Oncology

## 2013-10-08 VITALS — BP 144/68 | HR 79 | Temp 98.4°F | Wt 200.0 lb

## 2013-10-08 DIAGNOSIS — C50219 Malignant neoplasm of upper-inner quadrant of unspecified female breast: Secondary | ICD-10-CM

## 2013-10-08 NOTE — Progress Notes (Signed)
Patient for weekly assessment of right breast radiation.Completed 10 of 16 treatments.Skin pink with and inverted nipple.Denies pain.No change in fatigue.Some improvement in hemoglobin related to iron infusion and daily oral intake of iron.

## 2013-10-08 NOTE — Progress Notes (Signed)
   Weekly Management Note:  outpatient Current Dose:  26.6 Gy  Projected Dose: 42.56 Gy   Narrative:  The patient presents for routine under treatment assessment.  CBCT/MVCT images/Port film x-rays were reviewed.  The chart was checked. She is doing well. Skin mildly erythematous over right breast.  Having reexcision of melanoma on arm this week.  Physical Findings:  weight is 200 lb (90.719 kg). Her temperature is 98.4 F (36.9 C). Her blood pressure is 144/68 and her pulse is 79.  Skin mildly erythematous over right breast.   Impression:  The patient is tolerating radiotherapy.  Plan:  Continue radiotherapy as planned.    ________________________________   Eppie Gibson, M.D.

## 2013-10-09 ENCOUNTER — Ambulatory Visit
Admission: RE | Admit: 2013-10-09 | Discharge: 2013-10-09 | Disposition: A | Discharge status: Home / Self Care | Payer: Medicare Other | Source: Ambulatory Visit | Attending: Radiation Oncology | Admitting: Radiation Oncology

## 2013-10-09 NOTE — Progress Notes (Signed)
Talked with pt-to bring list meds, ins card and id

## 2013-10-10 ENCOUNTER — Ambulatory Visit
Admission: RE | Admit: 2013-10-10 | Discharge: 2013-10-10 | Disposition: A | Discharge status: Home / Self Care | Payer: Medicare Other | Source: Ambulatory Visit | Attending: Radiation Oncology | Admitting: Radiation Oncology

## 2013-10-11 ENCOUNTER — Ambulatory Visit
Admission: RE | Admit: 2013-10-11 | Discharge: 2013-10-11 | Disposition: A | Discharge status: Home / Self Care | Payer: Medicare Other | Source: Ambulatory Visit | Attending: Radiation Oncology | Admitting: Radiation Oncology

## 2013-10-12 ENCOUNTER — Encounter (HOSPITAL_BASED_OUTPATIENT_CLINIC_OR_DEPARTMENT_OTHER)
Admission: RE | Disposition: A | Discharge status: Home / Self Care | Payer: Self-pay | Source: Ambulatory Visit | Attending: Surgery

## 2013-10-12 ENCOUNTER — Ambulatory Visit (HOSPITAL_BASED_OUTPATIENT_CLINIC_OR_DEPARTMENT_OTHER)
Admission: RE | Admit: 2013-10-12 | Discharge: 2013-10-12 | Disposition: A | Discharge status: Home / Self Care | Payer: Medicare Other | Source: Ambulatory Visit | Attending: Surgery | Admitting: Surgery

## 2013-10-12 ENCOUNTER — Ambulatory Visit: Payer: Medicare Other

## 2013-10-12 DIAGNOSIS — E559 Vitamin D deficiency, unspecified: Secondary | ICD-10-CM | POA: Insufficient documentation

## 2013-10-12 DIAGNOSIS — Z79899 Other long term (current) drug therapy: Secondary | ICD-10-CM | POA: Insufficient documentation

## 2013-10-12 DIAGNOSIS — D235 Other benign neoplasm of skin of trunk: Secondary | ICD-10-CM | POA: Insufficient documentation

## 2013-10-12 DIAGNOSIS — N6009 Solitary cyst of unspecified breast: Secondary | ICD-10-CM | POA: Insufficient documentation

## 2013-10-12 DIAGNOSIS — Z853 Personal history of malignant neoplasm of breast: Secondary | ICD-10-CM | POA: Insufficient documentation

## 2013-10-12 DIAGNOSIS — K219 Gastro-esophageal reflux disease without esophagitis: Secondary | ICD-10-CM | POA: Insufficient documentation

## 2013-10-12 DIAGNOSIS — IMO0001 Reserved for inherently not codable concepts without codable children: Secondary | ICD-10-CM | POA: Insufficient documentation

## 2013-10-12 DIAGNOSIS — L821 Other seborrheic keratosis: Secondary | ICD-10-CM | POA: Insufficient documentation

## 2013-10-12 DIAGNOSIS — D509 Iron deficiency anemia, unspecified: Secondary | ICD-10-CM | POA: Insufficient documentation

## 2013-10-12 DIAGNOSIS — Z9010 Acquired absence of unspecified breast and nipple: Secondary | ICD-10-CM | POA: Insufficient documentation

## 2013-10-12 DIAGNOSIS — C4360 Malignant melanoma of unspecified upper limb, including shoulder: Secondary | ICD-10-CM | POA: Insufficient documentation

## 2013-10-12 DIAGNOSIS — I10 Essential (primary) hypertension: Secondary | ICD-10-CM | POA: Insufficient documentation

## 2013-10-12 DIAGNOSIS — E7800 Pure hypercholesterolemia, unspecified: Secondary | ICD-10-CM | POA: Insufficient documentation

## 2013-10-12 DIAGNOSIS — Z923 Personal history of irradiation: Secondary | ICD-10-CM | POA: Insufficient documentation

## 2013-10-12 DIAGNOSIS — C4361 Malignant melanoma of right upper limb, including shoulder: Secondary | ICD-10-CM

## 2013-10-12 HISTORY — PX: MASS EXCISION: SHX2000

## 2013-10-12 SURGERY — MINOR EXCISION OF MASS
Anesthesia: LOCAL

## 2013-10-12 MED ORDER — LIDOCAINE-EPINEPHRINE 1 %-1:100000 IJ SOLN
INTRAMUSCULAR | Status: AC
  Filled 2013-10-12: qty 2

## 2013-10-12 MED ORDER — LIDOCAINE-EPINEPHRINE 1 %-1:100000 IJ SOLN
INTRAMUSCULAR | Status: DC | PRN
  Administered 2013-10-12: 30 mL

## 2013-10-12 MED ORDER — LIDOCAINE-EPINEPHRINE 1 %-1:100000 IJ SOLN
INTRAMUSCULAR | Status: AC
  Filled 2013-10-12: qty 1

## 2013-10-12 SURGICAL SUPPLY — 38 items
ADH SKN CLS APL DERMABOND .7 (GAUZE/BANDAGES/DRESSINGS) ×1
APL SKNCLS STERI-STRIP NONHPOA (GAUZE/BANDAGES/DRESSINGS)
BANDAGE ADH SHEER 1  50/CT (GAUZE/BANDAGES/DRESSINGS) IMPLANT
BANDAGE ELASTIC 4 VELCRO ST LF (GAUZE/BANDAGES/DRESSINGS) ×1 IMPLANT
BENZOIN TINCTURE PRP APPL 2/3 (GAUZE/BANDAGES/DRESSINGS) IMPLANT
BLADE 15 SAFETY STRL DISP (BLADE) ×3 IMPLANT
CONT SPEC 4OZ CLIKSEAL STRL BL (MISCELLANEOUS) ×1 IMPLANT
DERMABOND ADVANCED (GAUZE/BANDAGES/DRESSINGS) ×1
DERMABOND ADVANCED .7 DNX12 (GAUZE/BANDAGES/DRESSINGS) IMPLANT
DRSG TEGADERM 2-3/8X2-3/4 SM (GAUZE/BANDAGES/DRESSINGS) IMPLANT
DRSG TEGADERM 4X4.75 (GAUZE/BANDAGES/DRESSINGS) IMPLANT
GAUZE SPONGE 4X4 16PLY XRAY LF (GAUZE/BANDAGES/DRESSINGS) ×1 IMPLANT
GLOVE BIO SURGEON STRL SZ7 (GLOVE) ×1 IMPLANT
GLOVE SURG SIGNA 7.5 PF LTX (GLOVE) ×2 IMPLANT
MARKER SKIN DUAL TIP RULER LAB (MISCELLANEOUS) ×2 IMPLANT
NDL HYPO 25X1 1.5 SAFETY (NEEDLE) IMPLANT
NDL HYPO 25X5/8 SAFETYGLIDE (NEEDLE) ×1 IMPLANT
NDL SAFETY ECLIPSE 18X1.5 (NEEDLE) ×1 IMPLANT
NEEDLE HYPO 18GX1.5 SHARP (NEEDLE) ×4
NEEDLE HYPO 25X1 1.5 SAFETY (NEEDLE) IMPLANT
NEEDLE HYPO 25X5/8 SAFETYGLIDE (NEEDLE) ×2 IMPLANT
NS IRRIG 1000ML POUR BTL (IV SOLUTION) ×1 IMPLANT
SPONGE GAUZE 4X4 12PLY (GAUZE/BANDAGES/DRESSINGS) ×1 IMPLANT
SPONGE GAUZE 4X4 12PLY STER LF (GAUZE/BANDAGES/DRESSINGS) IMPLANT
STRIP CLOSURE SKIN 1/2X4 (GAUZE/BANDAGES/DRESSINGS) IMPLANT
STRIP CLOSURE SKIN 1/4X4 (GAUZE/BANDAGES/DRESSINGS) IMPLANT
SUT ETHILON 3 0 PS 1 (SUTURE) ×2 IMPLANT
SUT ETHILON 4 0 PS 2 18 (SUTURE) IMPLANT
SUT ETHILON 5 0 P 3 18 (SUTURE) ×1
SUT MON AB 5-0 PS2 18 (SUTURE) ×1 IMPLANT
SUT NYLON ETHILON 5-0 P-3 1X18 (SUTURE) IMPLANT
SUT VIC AB 5-0 PC1 18 (SUTURE) IMPLANT
SUT VICRYL 3-0 CR8 SH (SUTURE) ×1 IMPLANT
SUT VICRYL 4-0 PS2 18IN ABS (SUTURE) IMPLANT
SWABSTICK POVIDONE IODINE SNGL (MISCELLANEOUS) ×9 IMPLANT
SYR CONTROL 10ML LL (SYRINGE) ×3 IMPLANT
TOWEL OR NON WOVEN STRL DISP B (DISPOSABLE) ×2 IMPLANT
UNDERPAD 30X30 INCONTINENT (UNDERPADS AND DIAPERS) IMPLANT

## 2013-10-12 NOTE — Interval H&P Note (Signed)
History and Physical Interval Note:  10/12/2013 12:24 PM  [name]  has presented today for surgery, with the diagnosis of right arm melonoma abdominal mole low back mass  The various methods of treatment have been discussed with the patient and family.   Her husband is with her.  After consideration of risks, benefits and other options for treatment, the patient has consented to  Procedure(s): MINOR wide excision melonoma right arm / abdominal mole / low back mole  (N/A) as a surgical intervention .  The patient's history has been reviewed, patient examined, no change in status, stable for surgery.  I have reviewed the patient's chart and labs.  Questions were answered to the patient's satisfaction.     Shann Medal
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
## 2013-10-15 ENCOUNTER — Ambulatory Visit
Admission: RE | Admit: 2013-10-15 | Discharge: 2013-10-15 | Disposition: A | Discharge status: Home / Self Care | Payer: Medicare Other | Source: Ambulatory Visit | Attending: Radiation Oncology | Admitting: Radiation Oncology

## 2013-10-15 ENCOUNTER — Encounter: Payer: Self-pay | Admitting: Radiation Oncology

## 2013-10-15 ENCOUNTER — Ambulatory Visit: Payer: Medicare Other

## 2013-10-15 VITALS — BP 138/94 | HR 93 | Resp 16 | Wt 201.0 lb

## 2013-10-15 DIAGNOSIS — C50219 Malignant neoplasm of upper-inner quadrant of unspecified female breast: Secondary | ICD-10-CM

## 2013-10-15 NOTE — Op Note (Signed)
NAMEGERRY, [name]            ACCOUNT NO.:  [id number]  MEDICAL RECORD NO.:  [id number]  LOCATION:                                 FACILITY:  PHYSICIAN:  Fenton Malling. Lucia Gaskins, M.D.  DATE OF BIRTH:  August 05, 1940  DATE OF PROCEDURE:  10/12/2013                              OPERATIVE REPORT   PREOPERATIVE DIAGNOSIS:  Melanoma, right upper arm; mole, lower abdomen; mole, lower back.  POSTOPERATIVE DIAGNOSIS:  Melanoma, right upper arm; 0.9 cm mole, lower abdomen; and 0.3 cm mole of back.  PROCEDURE:  Wide excision of melanoma of right upper arm and excision of mole of lower abdomen and mole of lower back.  SURGEON:  Fenton Malling. Lucia Gaskins, MD  FIRST ASSISTANT:  None.  ANESTHESIA:  Approximately 20 mL of 1% Xylocaine.  COMPLICATIONS:  None.  INDICATION FOR PROCEDURE:  Ms. Trafton is a 73 year old white female who I saw for a right breast cancer.  On my examination, I found an abnormal lesion of right upper arm which, on biopsy, proved to be a Clark's level II, Breslow level 0.4 mm melanoma.  She now comes for wider excision of this lesion.  She also has an irregular mole of her lower abdomen and irregular mole of her back, which I will excise the same time.  DESCRIPTION OF PROCEDURE:  The patient was taken to room #5 at [facility] Day surgery, this was all under local anesthesia.  Her right arm was prepped, infiltrated with about 10 mL of 1% Xylocaine with epinephrine, made an elliptical incision around her old scar of the melanoma trying to go 1 cm polyp on all margins, on all sides and took the fat below it going down about a cm and half below the lesion.  This was sent to Pathology.  There was a long suture distal, short suture was ulnar.  The wound was then closed with interrupted 3-0 Vicryl sutures and interrupted 3-0 nylon sutures on the skin because of tension.  I then dressed with 4x4s and an Ace bandage.  I then turned my attention to her abdomen, painted with Betadine and infiltrated with about  5 mL of 1% Xylocaine.  I made an elliptical incision around about 0.9 cm mole and sent this to Pathology.  The wound was closed with 3-0 Vicryl suture and 5-0 Monocryl suture, painted with Dermabond.  She then rolled on the left side.  I prepped her back with Betadine, infiltrated with 5 mL of 1% Xylocaine.  I made an elliptical incision around a 0.3-cm lesion of her low back.  Because the skin was so tight that, I did a single 3-0 Vicryl suture to pull the skin together there.  Each wound was dressed.  The patient tolerated procedure well. Pathology is pending at the time of this dictation.   Fenton Malling. Lucia Gaskins, M.D.   DHN/MEDQ  D:  10/14/2013  T:  10/14/2013  Job:  [id number]  cc:   Milford Cage. Laurann Montana, M.D. Wyvonnia Lora, M.D. Marcy Panning, M.D.

## 2013-10-15 NOTE — Progress Notes (Signed)
Patient scheduled to complete radiation treatment in two days. Provided patient with one month follow up appointment card. Right breast with mild hyperpigmentation but, no desquamation. Reports using radiaplex bid as directed. Reports right arm above elbow is wrapped because she had melanoma removed on Friday. Denies fatigue.

## 2013-10-15 NOTE — Progress Notes (Signed)
   Weekly Management Note:  outpatient Current Dose:  37.24 Gy  Projected Dose: 42.56 Gy   Narrative:  The patient presents for routine under treatment assessment.  CBCT/MVCT images/Port film x-rays were reviewed.  The chart was checked. She is doing well.  Minimal complaints.  Physical Findings:  weight is 201 lb (91.173 kg). Her blood pressure is 138/94 and her pulse is 93. Her respiration is 16.  right breast - skin intact, erythematous  Impression:  The patient is tolerating radiotherapy.  Plan:  Continue radiotherapy as planned. F/u in 1 mo   ________________________________   Eppie Gibson, M.D.

## 2013-10-16 ENCOUNTER — Ambulatory Visit: Payer: Medicare Other

## 2013-10-16 ENCOUNTER — Ambulatory Visit
Admission: RE | Admit: 2013-10-16 | Discharge: 2013-10-16 | Disposition: A | Discharge status: Home / Self Care | Payer: Medicare Other | Source: Ambulatory Visit | Attending: Radiation Oncology | Admitting: Radiation Oncology

## 2013-10-17 ENCOUNTER — Ambulatory Visit
Admission: RE | Admit: 2013-10-17 | Discharge: 2013-10-17 | Disposition: A | Discharge status: Home / Self Care | Payer: Medicare Other | Source: Ambulatory Visit | Attending: Radiation Oncology | Admitting: Radiation Oncology

## 2013-10-17 ENCOUNTER — Encounter: Payer: Self-pay | Admitting: Radiation Oncology

## 2013-10-19 ENCOUNTER — Telehealth: Payer: Self-pay | Admitting: *Deleted

## 2013-10-19 NOTE — Telephone Encounter (Signed)
R/S pt to see Dr. Marko Plume on 11/01/13 at 2:00 d/t Dr.Khan on LOA.  Confirmed new appt date and time.  Pt denies further needs at this time.

## 2013-10-19 NOTE — Progress Notes (Signed)
  Radiation Oncology         [phone] ________________________________  Name: [name] MRN: [id number]  Date: 10/17/2013  DOB: [birth year]  End of Treatment Note  Diagnosis:   Stage I [id number] Right breast cancer, Lobular, Grade 2, ER 100% PR 8%, Her 2 (-) Ki67 10%  Indication for treatment:  curative       Radiation treatment dates:   09/24/2013-10/17/2013  Site/dose:   Right breast / 42.56 Gy in 16 fractions  Beams/energy:    3 D conformal with opposed tangents / 10 and 6 MV photons  Narrative: The patient tolerated radiation treatment relatively well with some skin erythema.     Plan: The patient has completed radiation treatment. The patient will return to radiation oncology clinic for routine followup in one month. I advised them to call or return sooner if they have any questions or concerns related to their recovery or treatment.  -----------------------------------  Eppie Gibson, MD

## 2013-10-29 ENCOUNTER — Telehealth (INDEPENDENT_AMBULATORY_CARE_PROVIDER_SITE_OTHER): Payer: Self-pay

## 2013-10-29 NOTE — Telephone Encounter (Signed)
Pt calling stating she is to come in today or tomorrow for suture removal. I do not see order or msg in system to remove sutures. Pt advised I will send msg to Dr Lucia Gaskins and Holley Raring for order to see Nurse only or MD for suture removal and when. Pt can be reached at [phone] or [phone].

## 2013-10-29 NOTE — Telephone Encounter (Signed)
Appointment nurse only 10-30-13@ 330p suture removal per DR. [employer]

## 2013-10-30 ENCOUNTER — Encounter (INDEPENDENT_AMBULATORY_CARE_PROVIDER_SITE_OTHER): Payer: Medicare Other

## 2013-10-30 ENCOUNTER — Other Ambulatory Visit: Payer: Self-pay | Admitting: Oncology

## 2013-10-30 NOTE — Addendum Note (Signed)
Encounter addended by: Eppie Gibson, MD on: 10/30/2013  9:17 AM<BR>     Documentation filed: Notes Section

## 2013-11-01 ENCOUNTER — Ambulatory Visit (HOSPITAL_BASED_OUTPATIENT_CLINIC_OR_DEPARTMENT_OTHER): Payer: Medicare Other | Admitting: Oncology

## 2013-11-01 ENCOUNTER — Telehealth: Payer: Self-pay | Admitting: Oncology

## 2013-11-01 ENCOUNTER — Encounter: Payer: Self-pay | Admitting: Oncology

## 2013-11-01 ENCOUNTER — Other Ambulatory Visit: Payer: Self-pay | Admitting: *Deleted

## 2013-11-01 ENCOUNTER — Other Ambulatory Visit (HOSPITAL_BASED_OUTPATIENT_CLINIC_OR_DEPARTMENT_OTHER): Payer: Medicare Other

## 2013-11-01 VITALS — BP 158/92 | HR 73 | Temp 97.4°F | Resp 18 | Wt 201.4 lb

## 2013-11-01 DIAGNOSIS — C4361 Malignant melanoma of right upper limb, including shoulder: Secondary | ICD-10-CM

## 2013-11-01 DIAGNOSIS — Z170 Estrogen receptor positive status [ER+]: Secondary | ICD-10-CM

## 2013-11-01 DIAGNOSIS — D509 Iron deficiency anemia, unspecified: Secondary | ICD-10-CM

## 2013-11-01 DIAGNOSIS — C50319 Malignant neoplasm of lower-inner quadrant of unspecified female breast: Secondary | ICD-10-CM

## 2013-11-01 DIAGNOSIS — M949 Disorder of cartilage, unspecified: Secondary | ICD-10-CM

## 2013-11-01 DIAGNOSIS — C50911 Malignant neoplasm of unspecified site of right female breast: Secondary | ICD-10-CM

## 2013-11-01 DIAGNOSIS — C4360 Malignant melanoma of unspecified upper limb, including shoulder: Secondary | ICD-10-CM

## 2013-11-01 DIAGNOSIS — M899 Disorder of bone, unspecified: Secondary | ICD-10-CM

## 2013-11-01 DIAGNOSIS — C50219 Malignant neoplasm of upper-inner quadrant of unspecified female breast: Secondary | ICD-10-CM

## 2013-11-01 LAB — CBC WITH DIFFERENTIAL/PLATELET
BASO%: 1.1 % (ref 0.0–2.0)
Basophils Absolute: 0.1 10*3/uL (ref 0.0–0.1)
EOS%: 3.3 % (ref 0.0–7.0)
Eosinophils Absolute: 0.3 10*3/uL (ref 0.0–0.5)
HEMATOCRIT: 46.7 % — AB (ref 34.8–46.6)
HGB: 15 g/dL (ref 11.6–15.9)
LYMPH%: 16 % (ref 14.0–49.7)
MCH: 27.1 pg (ref 25.1–34.0)
MCHC: 32.3 g/dL (ref 31.5–36.0)
MCV: 84.1 fL (ref 79.5–101.0)
MONO#: 0.6 10*3/uL (ref 0.1–0.9)
MONO%: 8.1 % (ref 0.0–14.0)
NEUT#: 5.4 10*3/uL (ref 1.5–6.5)
NEUT%: 71.5 % (ref 38.4–76.8)
PLATELETS: 221 10*3/uL (ref 145–400)
RBC: 5.55 10*6/uL — AB (ref 3.70–5.45)
RDW: 19.8 % — ABNORMAL HIGH (ref 11.2–14.5)
WBC: 7.6 10*3/uL (ref 3.9–10.3)
lymph#: 1.2 10*3/uL (ref 0.9–3.3)

## 2013-11-01 LAB — COMPREHENSIVE METABOLIC PANEL (CC13)
ALK PHOS: 85 U/L (ref 40–150)
ALT: 22 U/L (ref 0–55)
ANION GAP: 14 meq/L — AB (ref 3–11)
AST: 25 U/L (ref 5–34)
Albumin: 3.9 g/dL (ref 3.5–5.0)
BUN: 13.9 mg/dL (ref 7.0–26.0)
CO2: 24 mEq/L (ref 22–29)
Calcium: 10.5 mg/dL — ABNORMAL HIGH (ref 8.4–10.4)
Chloride: 106 mEq/L (ref 98–109)
Creatinine: 1 mg/dL (ref 0.6–1.1)
Glucose: 91 mg/dl (ref 70–140)
Potassium: 4.1 mEq/L (ref 3.5–5.1)
SODIUM: 144 meq/L (ref 136–145)
TOTAL PROTEIN: 7.2 g/dL (ref 6.4–8.3)
Total Bilirubin: 0.39 mg/dL (ref 0.20–1.20)

## 2013-11-01 LAB — IRON AND TIBC CHCC
%SAT: 34 % (ref 21–57)
Iron: 119 ug/dL (ref 41–142)
TIBC: 346 ug/dL (ref 236–444)
UIBC: 227 ug/dL (ref 120–384)

## 2013-11-01 LAB — FERRITIN CHCC: FERRITIN: 18 ng/mL (ref 9–269)

## 2013-11-01 MED ORDER — TAMOXIFEN CITRATE 20 MG PO TABS: 20.0000 mg | ORAL_TABLET | Freq: Every day | ORAL | Status: DC

## 2013-11-01 NOTE — Telephone Encounter (Signed)
, °

## 2013-11-01 NOTE — Progress Notes (Signed)
OFFICE PROGRESS NOTE   11/01/2013   Physicians: Marcy Panning, Kelton Pillar, Brien Few, Delfin Edis, Alphonsa Overall, Eppie Gibson. Referral to Nena Polio.  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 Dr Humphrey Rolls had discussed rationale for antiestrogen treatment and therapy choices with her at last visit 09-11-2013.  Bone density scan from [facility] 06-19-2013 report now available, with lowest T score -1.3 at hip, osteopenic.  Patient tolerated radiation therapy generally well, with skin reaction towards completion of treatment. This is progressively improving since completion of radiation on 10-17-13. 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   ONCOLOGIC HISTORY Right breast abnormality found on screening mammograms at [facility]. Needle biopsy documented grade I-II invasive carcinoma with lobular features, ER PR positive at 100% and 80% respectively, HER 2 negative and Ki-67 10%. Breast MRI 07-23-2013 showed 1.9 cm upper inner right breast mass, with nothing of concern in left breast and no adenopathy. Silicone implants were noted to have intracapsular rupture bilaterally. She had lumpectomy with 4 sentinel node evaluation by Dr Lucia Gaskins on 08-10-13, with removal of right breast implant at that procedure. Surgical pathology showed 1.8 cm grade 2 invasive lobular carcinoma, nodes negative. She received 3D conformal radiation 42.56 Gy in 16 fractions from 09-24-13 thru 10-17-13 by Dr Isidore Moos.  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 She had 2 other lesions excised, from low back and abdomen, both dysplastic nevi with atypia. She has not been seen by dermatologist, but is in agreement with referral for full skin exam, requests Dr Nena Polio if possible.   Note that just prior to diagnosis of breast cancer, patient had been found to have iron deficiency anemia at routine physical exam in Dec 2014, with hemoglobin in ~ Dec 2014 7.6 with MCV 65.9 . She had upper endoscopy and colonoscopy by Dr Olevia Perches 07-05-13, with no findings to account for the iron deficiency. She received feraheme two doses of 510 mg each in 07-2013 and has continued oral iron. Hemoglobin was up to 9.5 with MCV of 74.6 by early March. She notices less fatigue and less SOB with exertion than she had in late 2014.  Review of systems as above, also: No noted bleeding. No changes in left breast. No significant hot flashes. No arthritis symptoms. No LE swelling. Remainder of 10 point Review of Systems negative.  Objective:  Vital signs in last 24 hours:  BP 158/92  Pulse 73  Temp(Src) 97.4 F (36.3 C) (Oral)  Resp 18  Wt 201 lb 6.4 oz (91.354 kg)  Alert, oriented and appropriate. Ambulatory without difficulty.    HEENT:PERRL, sclerae not icteric. Oral mucosa moist without lesions, posterior pharynx clear.  Neck supple. No JVD.  Lymphatics:no cervical,suraclavicular, axillary adenopathy Resp: clear to auscultation bilaterally and normal percussion bilaterally Cardio: regular rate and rhythm. No gallop. GI: abdomen obese, soft, nontender, not distended, no appreciable mass or organomegaly. Normally active bowel sounds.  Musculoskeletal/ Extremities: without pitting edema, cords, tenderness Neuro:  nonfocal PSYCH mood and affect appropriate Skin without rash, ecchymosis,  petechiae. Right upper arm above olecranon with incision closed, no erythema, heat or swelling. Excisional biopsy site low back closed and not remarkable.  Breasts:  Right lumpectomy scar well healed. Mild erythema and dry desquamation in radiation portal. No dominant mass, skin or nipple findings. Left breast without dominant mass, skin or nipple findings. Axillae benign.  Lab Results:  Results for orders placed in visit on 11/01/13  CBC WITH DIFFERENTIAL      Result Value Ref Range   WBC 7.6  3.9 - 10.3 10e3/uL   NEUT# 5.4  1.5 - 6.5 10e3/uL   HGB 15.0  11.6 - 15.9 g/dL   HCT 46.7 (*) 34.8 - 46.6 %   Platelets 221  145 - 400 10e3/uL   MCV 84.1  79.5 - 101.0 fL   MCH 27.1  25.1 - 34.0 pg   MCHC 32.3  31.5 - 36.0 g/dL   RBC 5.55 (*) 3.70 - 5.45 10e6/uL   RDW 19.8 (*) 11.2 - 14.5 %   lymph# 1.2  0.9 - 3.3 10e3/uL   MONO# 0.6  0.1 - 0.9 10e3/uL   Eosinophils Absolute 0.3  0.0 - 0.5 10e3/uL   Basophils Absolute 0.1  0.0 - 0.1 10e3/uL   NEUT% 71.5  38.4 - 76.8 %   LYMPH% 16.0  14.0 - 49.7 %   MONO% 8.1  0.0 - 14.0 %   EOS% 3.3  0.0 - 7.0 %   BASO% 1.1  0.0 - 2.0 %  COMPREHENSIVE METABOLIC PANEL (YQ03)      Result Value Ref Range   Sodium 144  136 - 145 mEq/L   Potassium 4.1  3.5 - 5.1 mEq/L   Chloride 106  98 - 109 mEq/L   CO2 24  22 - 29 mEq/L   Glucose 91  70 - 140 mg/dl   BUN 13.9  7.0 - 26.0 mg/dL   Creatinine 1.0  0.6 - 1.1 mg/dL   Total Bilirubin 0.39  0.20 - 1.20 mg/dL   Alkaline Phosphatase 85  40 - 150 U/L   AST 25  5 - 34 U/L   ALT 22  0 - 55 U/L   Total Protein 7.2  6.4 - 8.3 g/dL   Albumin 3.9  3.5 - 5.0 g/dL   Calcium 10.5 (*) 8.4 - 10.4 mg/dL   Anion Gap 14 (*) 3 - 11 mEq/L  IRON AND TIBC [facility]      Result Value Ref Range   Iron 119  41 - 142 ug/dL   TIBC 346  236 - 444 ug/dL   UIBC 227  120 - 384 ug/dL   %SAT 34  21 - 57 %  FERRITIN [facility]      Result Value Ref Range   Ferritin 18  9 - 269 ng/ml     Studies/Results:  Pathology reports reviewed in EMR  Medications: I have reviewed the patient's current medications. With improvement in iron studies and hemoglobin, she will decrease oral iron to 3x weekly until next  visit. She will begin tamoxifen 20 mg daily.  DISCUSSION: We have reviewed events since Dec 2014, including the iron deficiency anemia and melanoma diagnoses in addition to breast cancer. She is comfortable with thorough discussion previously with Dr Humphrey Rolls and has had all questions answered to her satisfaction. She is in agreement with recommendation to begin antiestrogen therapy with tamoxifen, which was Dr Laurelyn Sickle preference for her also. She understands that she can call at any time prior to next scheduled appointment  if questions or concerns.  Assessment/Plan: 1.T1No invasive lobular carcinoma of right breast: post lumpectomy with sentinel nodes, local radiation and to begin tamoxifen. She will be seen back by medical oncology in 3-4 months. 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 Atypical nevi x2 also excised. Referral to dermatology for full body exam and follow up. 3.iron deficiency anemia identified Dec 2014, no GI source identified, may be absorption difficulty. May need UA for completeness, as I do not find this recently at least in this EMR. Iron deficiency much improved since IV and po iron, plan as above 4.osteopenia by bone density scan [facility] 05-2013. On bid calcium with D 5.bilateral breast implants ruptured by MRI: right removed at lumpectomy, left still in place. 6.HTN, vitamin D deficiency on replacement, fibromyalgia, hypercholesterolemia,obesity   Patient is in agreement with plan above.  Time spent 30 min including >50% counseling and coordination of care.   Gordy Levan, MD   11/01/2013, 3:01 PM

## 2013-11-04 ENCOUNTER — Other Ambulatory Visit: Payer: Self-pay | Admitting: Oncology

## 2013-11-04 DIAGNOSIS — D509 Iron deficiency anemia, unspecified: Secondary | ICD-10-CM

## 2013-11-05 ENCOUNTER — Other Ambulatory Visit: Payer: Medicare Other

## 2013-11-05 ENCOUNTER — Ambulatory Visit: Payer: Medicare Other | Admitting: Adult Health

## 2013-11-07 ENCOUNTER — Telehealth: Payer: Self-pay | Admitting: *Deleted

## 2013-11-07 ENCOUNTER — Encounter: Payer: Self-pay | Admitting: Radiation Oncology

## 2013-11-07 NOTE — Telephone Encounter (Signed)
Message copied by Patton Salles on Wed Nov 07, 2013  9:11 AM ------      Message from: Gordy Levan      Created: Sun Nov 04, 2013  9:13 AM       Please let her know that iron studies were sent at visit last week and are much better, with only ferritin still towards low end of normal at 18. Suggest she take iron just 2-3x weekly until next labs with visit in August. Please tell her that I have also ordered a urine test at that return visit, as occasionally microscopic blood loss from urinary system can gradually result in iron deficiency, and I do not find a urine test in this EMR system recently.            Please also confirm with her that Dr Ronita Hipps is still her gyn physician; if not, need to route my note from 5-14 to correct gyn dr with comment "beginning tamoxifen".             Cc Mitzi Hansen ------

## 2013-11-07 NOTE — Telephone Encounter (Signed)
Notified patient of results below. Will take iron 2-3X weekly until next visit. Confirmed Dr Ronita Hipps is her gyn physician.

## 2013-11-16 ENCOUNTER — Encounter: Payer: Self-pay | Admitting: Radiation Oncology

## 2013-11-16 ENCOUNTER — Ambulatory Visit
Admission: RE | Admit: 2013-11-16 | Discharge: 2013-11-16 | Disposition: A | Discharge status: Home / Self Care | Payer: Medicare Other | Source: Ambulatory Visit | Attending: Radiation Oncology | Admitting: Radiation Oncology

## 2013-11-16 VITALS — BP 130/67 | HR 87 | Temp 98.7°F | Ht 65.0 in | Wt 202.5 lb

## 2013-11-16 DIAGNOSIS — C50219 Malignant neoplasm of upper-inner quadrant of unspecified female breast: Secondary | ICD-10-CM

## 2013-11-16 HISTORY — DX: Long term (current) use of selective estrogen receptor modulators (serms): Z79.810

## 2013-11-16 HISTORY — DX: Personal history of irradiation: Z92.3

## 2013-11-16 NOTE — Progress Notes (Signed)
Radiation Oncology         [phone] ________________________________  Name: [name] MRN: [id number]  Date: 11/16/2013  DOB: Mar 22, 1941  Follow-Up Visit Note  Outpatient  CC: Osborne Casco, MD  Kelton Pillar, MD  Diagnosis and Prior Radiotherapy:   Stage I T1cN0M0 Right breast cancer, Lobular, Grade 2, ER 100% PR 8%, Her 2 (-) Ki67 10%  Indication for treatment: curative  Radiation treatment dates: 09/24/2013-10/17/2013  Site/dose: Right breast / 42.56 Gy in 16 fractions  Narrative:  The patient returns today for routine follow-up.  Feeling good.  Taking Tamoxifen.  Would like plastic surgery for breast symmetry.                         ALLERGIES:  has No Known Allergies.  Meds: Current Outpatient Prescriptions  Medication Sig Dispense Refill  . Ascorbic Acid (VITAMIN C) 100 MG tablet Take 100 mg by mouth daily. chewable      . CALCIUM PO Take 1 capsule by mouth 2 (two) times daily. 1518m twice a day      . cholecalciferol (VITAMIN D) 1000 UNITS tablet Take 1,000 Units by mouth 2 (two) times daily. D#3 type      . ergocalciferol (VITAMIN D2) 50000 UNITS capsule Take 50,000 Units by mouth once a week.      . iron polysaccharides (NIFEREX) 150 MG capsule Take 150 mg by mouth daily.      .Javier DockerOil 1000 MG CAPS Take 1 capsule by mouth daily.      .Marland Kitchenlisinopril (PRINIVIL,ZESTRIL) 20 MG tablet Take 20 mg by mouth daily.      . Multiple Vitamin (MULTIVITAMIN) tablet Take 1 tablet by mouth 2 (two) times daily.       . non-metallic deodorant (Jethro Poling MISC Apply 1 application topically daily as needed.      . Omega-3 Fatty Acids (FISH OIL) 1000 MG CAPS Take 1 capsule by mouth 2 (two) times daily.      . Probiotic Product (PHILLIPS COLON HEALTH PO) Take 1 capsule by mouth daily.      . ranitidine (ZANTAC) 150 MG tablet Take 1 tablet (150 mg total) by mouth at bedtime.  30 tablet  2  . tamoxifen (NOLVADEX) 20 MG tablet Take 1 tablet (20 mg total) by mouth daily.  90  tablet  1  . vitamin E 100 UNIT capsule Take 100 Units by mouth daily.      . Wound Cleansers (RADIAPLEX EX) Apply topically.       No current facility-administered medications for this encounter.    Physical Findings: The patient is in no acute distress. Patient is alert and oriented.  height is _0  (1.651 m) and weight is 202 lb 8 oz (91.853 kg). Her temperature is 98.7 F (37.1 C). Her blood pressure is 130/67 and her pulse is 87. .     Erythema over right breast, skin intact.  Lab Findings: Lab Results  Component Value Date   WBC 7.6 11/01/2013   HGB 15.0 11/01/2013   HCT 46.7* 11/01/2013   MCV 84.1 11/01/2013   PLT 221 11/01/2013    Radiographic Findings: No results found.  Impression/Plan: Apply Vit E lotion to breast for further healing. I encouraged her to continue followup with medical oncology as she takes Tamoxifen. She will discuss plastic surgeons with Dr NLucia Gaskins she will need several months at least to heal before cosmetic surgery.  I will see her back on an  as-needed basis. I have encouraged her to call if she has any issues or concerns in the future. I wished her the very best.   ___________________   Eppie Gibson, MD

## 2013-11-16 NOTE — Progress Notes (Signed)
Ms. Storrs here for assessment s/p radiation to her right breast.  No isolated areas of hyperpigmentation on the right areola region.  Mild redness noted on treated breast.   She is now on Tamoxifen and denies any side-effects.

## 2013-11-27 ENCOUNTER — Telehealth: Payer: Self-pay

## 2013-11-27 NOTE — Telephone Encounter (Signed)
Rcvd by fax visit summary from [facility] dtd 11/27/13 Dr. Allyson Sabal.  Copy to [place].  Original to scan.

## 2014-02-05 ENCOUNTER — Other Ambulatory Visit (HOSPITAL_BASED_OUTPATIENT_CLINIC_OR_DEPARTMENT_OTHER): Payer: Medicare Other

## 2014-02-05 ENCOUNTER — Telehealth: Payer: Self-pay | Admitting: Adult Health

## 2014-02-05 ENCOUNTER — Ambulatory Visit (HOSPITAL_BASED_OUTPATIENT_CLINIC_OR_DEPARTMENT_OTHER): Payer: Medicare Other | Admitting: Adult Health

## 2014-02-05 VITALS — BP 134/59 | HR 74 | Temp 97.7°F | Resp 18 | Ht 65.0 in | Wt 200.4 lb

## 2014-02-05 DIAGNOSIS — C4360 Malignant melanoma of unspecified upper limb, including shoulder: Secondary | ICD-10-CM

## 2014-02-05 DIAGNOSIS — D509 Iron deficiency anemia, unspecified: Secondary | ICD-10-CM

## 2014-02-05 DIAGNOSIS — C50911 Malignant neoplasm of unspecified site of right female breast: Secondary | ICD-10-CM

## 2014-02-05 DIAGNOSIS — C50319 Malignant neoplasm of lower-inner quadrant of unspecified female breast: Secondary | ICD-10-CM

## 2014-02-05 DIAGNOSIS — C4361 Malignant melanoma of right upper limb, including shoulder: Secondary | ICD-10-CM

## 2014-02-05 DIAGNOSIS — M899 Disorder of bone, unspecified: Secondary | ICD-10-CM

## 2014-02-05 DIAGNOSIS — M949 Disorder of cartilage, unspecified: Secondary | ICD-10-CM

## 2014-02-05 LAB — CBC WITH DIFFERENTIAL/PLATELET
BASO%: 1 % (ref 0.0–2.0)
BASOS ABS: 0.1 10*3/uL (ref 0.0–0.1)
EOS%: 4 % (ref 0.0–7.0)
Eosinophils Absolute: 0.3 10*3/uL (ref 0.0–0.5)
HCT: 44.9 % (ref 34.8–46.6)
HEMOGLOBIN: 14.5 g/dL (ref 11.6–15.9)
LYMPH#: 1.9 10*3/uL (ref 0.9–3.3)
LYMPH%: 27.4 % (ref 14.0–49.7)
MCH: 29.9 pg (ref 25.1–34.0)
MCHC: 32.3 g/dL (ref 31.5–36.0)
MCV: 92.7 fL (ref 79.5–101.0)
MONO#: 0.6 10*3/uL (ref 0.1–0.9)
MONO%: 8.5 % (ref 0.0–14.0)
NEUT#: 4 10*3/uL (ref 1.5–6.5)
NEUT%: 59.1 % (ref 38.4–76.8)
Platelets: 212 10*3/uL (ref 145–400)
RBC: 4.84 10*6/uL (ref 3.70–5.45)
RDW: 14 % (ref 11.2–14.5)
WBC: 6.8 10*3/uL (ref 3.9–10.3)

## 2014-02-05 LAB — URINALYSIS, MICROSCOPIC - CHCC
Bilirubin (Urine): NEGATIVE
GLUCOSE UR CHCC: NEGATIVE mg/dL
Ketones: NEGATIVE mg/dL
NITRITE: NEGATIVE
PH: 6 (ref 4.6–8.0)
Protein: NEGATIVE mg/dL
RBC / HPF: NEGATIVE (ref 0–2)
Specific Gravity, Urine: 1.005 (ref 1.003–1.035)
Urobilinogen, UR: 0.2 mg/dL (ref 0.2–1)

## 2014-02-05 LAB — IRON AND TIBC CHCC
%SAT: 18 % — ABNORMAL LOW (ref 21–57)
Iron: 59 ug/dL (ref 41–142)
TIBC: 330 ug/dL (ref 236–444)
UIBC: 271 ug/dL (ref 120–384)

## 2014-02-05 LAB — FERRITIN CHCC: Ferritin: 23 ng/ml (ref 9–269)

## 2014-02-05 NOTE — Telephone Encounter (Signed)
, °

## 2014-02-05 NOTE — Progress Notes (Addendum)
OFFICE PROGRESS NOTE   01/27/2014   Physicians: Marcy Panning, Kelton Pillar, Brien Few, Delfin Edis, Alphonsa Overall, Eppie Gibson. Referral to Nena Polio.  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    EAutumneis here for follow up after being started on Tamoxifen by Dr. LMarko Plumeabout 3 months ago.  She is doing well today.  She has noticed some hair thinning and vaginal dryness.  She has longstanding fibromyalgia, and denies any increase in joint aches/pain since starting Tamoxifen.  She denies new pain, fevers, chills, bowel/bladder changes, nausea, vomiting, headaches, vision changes, or anyf further concerns.     ONCOLOGIC HISTORY [facility] Needle biopsy documented grade I-II invasive carcinoma with lobular features, ER PR positive at 100% and 80% respectively, HER 2 negative and Ki-67 10%. Breast MRI 07-23-2013 showed 1.9 cm upper inner right breast mass, with nothing of concern in left breast and no adenopathy. Silicone implants were noted to have intracapsular rupture bilaterally. She had lumpectomy with 4 sentinel node evaluation by Dr NLucia Gaskinson 08-10-13, with removal of right breast implant at that procedure. Surgical pathology showed 1.8 cm grade 2 invasive lobular carcinoma, nodes negative. 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 She had 2 other lesions excised, from low back and abdomen, both dysplastic nevi with atypia. She has been evaluated by Dr. LAllyson Sabaland tells me she sees him every 6 months.     Note that just prior to  diagnosis of breast cancer, patient had been found to have iron deficiency anemia at routine physical exam in Dec 2014, with hemoglobin in ~ Dec 2014 7.6 with MCV 65.9 . She had upper endoscopy and colonoscopy by Dr BOlevia Perches1-15-15, with no findings to account for the iron deficiency. She received feraheme two doses of 510 mg each in 07-2013 and has continued oral iron. Hemoglobin was up to 9.5 with MCV of 74.6 by early March..  Health Maintenance  Mammogram: 05/2013 Colonoscopy: 07/05/13 Bone Density Scan: 06/19/13, t-1.3, osteopenia Pap Smear: 2-3 years ago Eye Exam: 2 years ago  Vitamin D Level: 05/2013  Lipid Panel: 05/2013  Objective:  Vital signs in last 24 hours:  BP 134/59  Pulse 74  Temp(Src) 97.7 F (36.5 C) (Oral)  Resp 18  Ht 5' 5"  (1.651 m)  Wt 200 lb 6.4 oz (90.901 kg)  BMI 33.35 kg/m2  SpO2 99%  GENERAL: Patient is a well appearing female in no acute distress HEENT:  Sclerae anicteric.  Oropharynx clear and moist. No ulcerations or evidence of oropharyngeal candidiasis. Neck is supple.  NODES:  No cervical, supraclavicular, or axillary lymphadenopathy palpated.  BREAST EXAM:  Right lumpectomy site without nodularity, no masses, nodules, or sign of recurrence, radiation changes present.  Left breast no masses or nodules.  Benign bilateral breast exam.   LUNGS:  Clear to auscultation bilaterally.  No wheezes or rhonchi. HEART:  Regular rate and rhythm. No murmur appreciated. ABDOMEN:  Soft, nontender.  Positive, normoactive bowel sounds. No organomegaly palpated. MSK:  No focal spinal tenderness to palpation. Full range of motion bilaterally in  the upper extremities. EXTREMITIES:  No peripheral edema.   SKIN:  Clear with no obvious rashes or skin changes. No nail dyscrasia. NEURO:  Nonfocal. Well oriented.  Appropriate affect.  Lab Results:  Results for orders placed in visit on 02/09/2014  CBC WITH DIFFERENTIAL      Result Value Ref Range   WBC 6.8  3.9 - 10.3  10e3/uL   NEUT# 4.0  1.5 - 6.5 10e3/uL   HGB 14.5  11.6 - 15.9 g/dL   HCT 44.9  34.8 - 46.6 %   Platelets 212  145 - 400 10e3/uL   MCV 92.7  79.5 - 101.0 fL   MCH 29.9  25.1 - 34.0 pg   MCHC 32.3  31.5 - 36.0 g/dL   RBC 4.84  3.70 - 5.45 10e6/uL   RDW 14.0  11.2 - 14.5 %   lymph# 1.9  0.9 - 3.3 10e3/uL   MONO# 0.6  0.1 - 0.9 10e3/uL   Eosinophils Absolute 0.3  0.0 - 0.5 10e3/uL   Basophils Absolute 0.1  0.0 - 0.1 10e3/uL   NEUT% 59.1  38.4 - 76.8 %   LYMPH% 27.4  14.0 - 49.7 %   MONO% 8.5  0.0 - 14.0 %   EOS% 4.0  0.0 - 7.0 %   BASO% 1.0  0.0 - 2.0 %  URINALYSIS, MICROSCOPIC - [facility]      Result Value Ref Range   Glucose Negative  Negative mg/dL   Bilirubin (Urine) Negative  Negative   Ketones Negative  Negative mg/dL   Specific Gravity, Urine 1.005  1.003 - 1.035   Blood Trace  Negative   pH 6.0  4.6 - 8.0   Protein Negative  Negative- <30 mg/dL   Urobilinogen, UR 0.2  0.2 - 1 mg/dL   Nitrite Negative  Negative   Leukocyte Esterase Moderate  Negative   RBC / HPF Negative  0 - 2   WBC, UA 3-6  0 - 2   Bacteria, UA Few  Negative- Trace   Epithelial Cells Few  Negative- Few     Studies/Results:  Pathology reports reviewed in EMR  Medications:  Current Outpatient Prescriptions  Medication Sig Dispense Refill  . Ascorbic Acid (VITAMIN C) 100 MG tablet Take 100 mg by mouth daily.       Marland Kitchen BIOTIN PO Take 1 tablet by mouth every morning.      Marland Kitchen CALCIUM PO Take 1 capsule by mouth 2 (two) times daily. 1576m twice a day, chews.      . cholecalciferol (VITAMIN D) 1000 UNITS tablet Take 1,000 Units by mouth 2 (two) times daily. D#3 type      . Krill Oil 1000 MG CAPS Take 1 capsule by mouth daily.      .Marland Kitchenlisinopril (PRINIVIL,ZESTRIL) 20 MG tablet Take 20 mg by mouth daily.      . Multiple Vitamin (MULTIVITAMIN) tablet Take 1 tablet by mouth 2 (two) times daily. gummies      . non-metallic deodorant (ALRA) MISC Apply 1 application topically daily as needed.      . Omega-3 Fatty  Acids (FISH OIL) 1000 MG CAPS Take 1 capsule by mouth 2 (two) times daily.      . Probiotic Product (PHILLIPS COLON HEALTH PO) Take 1 capsule by mouth daily.      . ranitidine (ZANTAC) 150 MG tablet Take 1 tablet (150 mg total) by mouth at bedtime.  30 tablet  2  . tamoxifen (NOLVADEX) 20 MG tablet Take  1 tablet (20 mg total) by mouth daily.  90 tablet  1  . vitamin E 100 UNIT capsule Take 100 Units by mouth daily.      . iron polysaccharides (NIFEREX) 150 MG capsule Take 150 mg by mouth daily. Pt takes medication once a week.       No current facility-administered medications for this visit.     DISCUSSION:  [name] is doing well today.  She has no sign of recurrence. I recommended that she continue taking Tamoxifen daily.  We reviewed her health maintenance above.  I recommended healthy diet, exercise and monthly breast exams.    Assessment/Plan: 1.T1No invasive lobular carcinoma of right breast: post lumpectomy with sentinel nodes, local radiation and is taking Tamoxifen daily that was started 11/01/13.   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 Atypical nevi x2 also excised. Patient sees Dr. Allyson Sabal every 6 months.   3.iron deficiency anemia identified Dec 2014, no GI source identified, may be absorption difficulty. Her iron level is pending today as well as UA, however her hemoglobin is greatly improved. 4.osteopenia by bone density scan [facility] 05-2013. On bid calcium with D 5.bilateral breast implants ruptured and found on MRI: right removed at lumpectomy, left still in place. 6.HTN, vitamin D deficiency on replacement, fibromyalgia, hypercholesterolemia,obesity, all followed by Dr. Laurann Montana   Patient is in agreement with plan above. Tarrie will return in 3 months for labs and f/u with Dr. Lindi Adie.   She knows to call us in the interim for any questions or concerns.  We can certainly see her sooner if needed.  I spent 25 minutes counseling the patient face  to face.  The total time spent in the appointment was 30 minutes.   Minette Headland, NP Medical Oncology [facility] [phone]   02/14/2014, 2:11 PM   Attending Note  I personally saw and examined [name]. The plan of care was discussed with her. I agree with the assessment and plan as documented above. Stage I invasive lobular cancer of right breast status post lumpectomy sentinel nodes with radiation and currently taking tamoxifen daily. Tolerating it well she will return in 3 months for a followup physical exam. Signed Rulon Eisenmenger, MD

## 2014-02-05 NOTE — Patient Instructions (Signed)
You are doing well.  You have no sign of recurrence.  Continue taking Tamoxifen daily.  I recommend healthy diet, exercise, and monthly breast exams.    Breast Self-Awareness Practicing breast self-awareness may pick up problems early, prevent significant medical complications, and possibly save your life. By practicing breast self-awareness, you can become familiar with how your breasts look and feel and if your breasts are changing. This allows you to notice changes early. It can also offer you some reassurance that your breast health is good. One way to learn what is normal for your breasts and whether your breasts are changing is to do a breast self-exam. If you find a lump or something that was not present in the past, it is best to contact your caregiver right away. Other findings that should be evaluated by your caregiver include nipple discharge, especially if it is bloody; skin changes or reddening; areas where the skin seems to be pulled in (retracted); or new lumps and bumps. Breast pain is seldom associated with cancer (malignancy), but should also be evaluated by a caregiver. HOW TO PERFORM A BREAST SELF-EXAM The best time to examine your breasts is 5-7 days after your menstrual period is over. During menstruation, the breasts are lumpier, and it may be more difficult to pick up changes. If you do not menstruate, have reached menopause, or had your uterus removed (hysterectomy), you should examine your breasts at regular intervals, such as monthly. If you are breastfeeding, examine your breasts after a feeding or after using a breast pump. Breast implants do not decrease the risk for lumps or tumors, so continue to perform breast self-exams as recommended. Talk to your caregiver about how to determine the difference between the implant and breast tissue. Also, talk about the amount of pressure you should use during the exam. Over time, you will become more familiar with the variations of your  breasts and more comfortable with the exam. A breast self-exam requires you to remove all your clothes above the waist. 1. Look at your breasts and nipples. Stand in front of a mirror in a room with good lighting. With your hands on your hips, push your hands firmly downward. Look for a difference in shape, contour, and size from one breast to the other (asymmetry). Asymmetry includes puckers, dips, or bumps. Also, look for skin changes, such as reddened or scaly areas on the breasts. Look for nipple changes, such as discharge, dimpling, repositioning, or redness. 2. Carefully feel your breasts. This is best done either in the shower or tub while using soapy water or when flat on your back. Place the arm (on the side of the breast you are examining) above your head. Use the pads (not the fingertips) of your three middle fingers on your opposite hand to feel your breasts. Start in the underarm area and use  inch (2 cm) overlapping circles to feel your breast. Use 3 different levels of pressure (light, medium, and firm pressure) at each circle before moving to the next circle. The light pressure is needed to feel the tissue closest to the skin. The medium pressure will help to feel breast tissue a little deeper, while the firm pressure is needed to feel the tissue close to the ribs. Continue the overlapping circles, moving downward over the breast until you feel your ribs below your breast. Then, move one finger-width towards the center of the body. Continue to use the  inch (2 cm) overlapping circles to feel your breast as  you move slowly up toward the collar bone (clavicle) near the base of the neck. Continue the up and down exam using all 3 pressures until you reach the middle of the chest. Do this with each breast, carefully feeling for lumps or changes. 3.  Keep a written record with breast changes or normal findings for each breast. By writing this information down, you do not need to depend only on memory  for size, tenderness, or location. Write down where you are in your menstrual cycle, if you are still menstruating. Breast tissue can have some lumps or thick tissue. However, see your caregiver if you find anything that concerns you.  SEEK MEDICAL CARE IF:  You see a change in shape, contour, or size of your breasts or nipples.   You see skin changes, such as reddened or scaly areas on the breasts or nipples.   You have an unusual discharge from your nipples.   You feel a new lump or unusually thick areas.  Document Released: 06/07/2005 Document Revised: 05/24/2012 Document Reviewed: 09/22/2011 [facility] Patient Information 2015 [place], [place]. This information is not intended to replace advice given to you by your health care provider. Make sure you discuss any questions you have with your health care provider.

## 2014-02-06 ENCOUNTER — Encounter: Payer: Self-pay | Admitting: Adult Health

## 2014-02-06 NOTE — Telephone Encounter (Signed)
Routed patient request to Programmer, systems.

## 2014-02-07 ENCOUNTER — Telehealth: Payer: Self-pay | Admitting: *Deleted

## 2014-02-07 NOTE — Telephone Encounter (Signed)
Received last office visit note from Dr. Allyson Sabal. Note give to Lisabeth Register, NP to review.

## 2014-02-07 NOTE — Telephone Encounter (Signed)
LMOVM that patient does not have UTI per Lisabeth Register, NP. Told patient to call us back if she had any further questions or concerns.

## 2014-02-08 ENCOUNTER — Encounter: Payer: Self-pay | Admitting: Adult Health

## 2014-02-19 DEATH — deceased

## 2014-03-14 ENCOUNTER — Other Ambulatory Visit: Payer: Self-pay | Admitting: *Deleted

## 2014-03-14 DIAGNOSIS — C50219 Malignant neoplasm of upper-inner quadrant of unspecified female breast: Secondary | ICD-10-CM

## 2014-03-14 DIAGNOSIS — C4361 Malignant melanoma of right upper limb, including shoulder: Secondary | ICD-10-CM

## 2014-03-14 DIAGNOSIS — C50911 Malignant neoplasm of unspecified site of right female breast: Secondary | ICD-10-CM

## 2014-03-14 MED ORDER — TAMOXIFEN CITRATE 20 MG PO TABS: 20.0000 mg | ORAL_TABLET | Freq: Every day | ORAL | Status: DC

## 2014-05-07 ENCOUNTER — Other Ambulatory Visit: Payer: Self-pay

## 2014-05-07 DIAGNOSIS — C50911 Malignant neoplasm of unspecified site of right female breast: Secondary | ICD-10-CM

## 2014-05-08 ENCOUNTER — Other Ambulatory Visit (HOSPITAL_BASED_OUTPATIENT_CLINIC_OR_DEPARTMENT_OTHER): Payer: Medicare Other

## 2014-05-08 ENCOUNTER — Ambulatory Visit (HOSPITAL_BASED_OUTPATIENT_CLINIC_OR_DEPARTMENT_OTHER): Payer: Medicare Other | Admitting: Hematology and Oncology

## 2014-05-08 ENCOUNTER — Telehealth: Payer: Self-pay | Admitting: Hematology and Oncology

## 2014-05-08 DIAGNOSIS — D509 Iron deficiency anemia, unspecified: Secondary | ICD-10-CM

## 2014-05-08 DIAGNOSIS — C50211 Malignant neoplasm of upper-inner quadrant of right female breast: Secondary | ICD-10-CM

## 2014-05-08 DIAGNOSIS — M810 Age-related osteoporosis without current pathological fracture: Secondary | ICD-10-CM

## 2014-05-08 DIAGNOSIS — Z170 Estrogen receptor positive status [ER+]: Secondary | ICD-10-CM

## 2014-05-08 DIAGNOSIS — C50219 Malignant neoplasm of upper-inner quadrant of unspecified female breast: Secondary | ICD-10-CM

## 2014-05-08 DIAGNOSIS — Z85820 Personal history of malignant melanoma of skin: Secondary | ICD-10-CM

## 2014-05-08 DIAGNOSIS — C50911 Malignant neoplasm of unspecified site of right female breast: Secondary | ICD-10-CM

## 2014-05-08 LAB — CBC WITH DIFFERENTIAL/PLATELET
BASO%: 0.5 % (ref 0.0–2.0)
BASOS ABS: 0 10*3/uL (ref 0.0–0.1)
EOS ABS: 0.2 10*3/uL (ref 0.0–0.5)
EOS%: 3 % (ref 0.0–7.0)
HCT: 43.4 % (ref 34.8–46.6)
HEMOGLOBIN: 14 g/dL (ref 11.6–15.9)
LYMPH%: 21.2 % (ref 14.0–49.7)
MCH: 29.6 pg (ref 25.1–34.0)
MCHC: 32.3 g/dL (ref 31.5–36.0)
MCV: 91.8 fL (ref 79.5–101.0)
MONO#: 0.5 10*3/uL (ref 0.1–0.9)
MONO%: 6.4 % (ref 0.0–14.0)
NEUT%: 68.9 % (ref 38.4–76.8)
NEUTROS ABS: 5.6 10*3/uL (ref 1.5–6.5)
PLATELETS: 230 10*3/uL (ref 145–400)
RBC: 4.73 10*6/uL (ref 3.70–5.45)
RDW: 13.8 % (ref 11.2–14.5)
WBC: 8.1 10*3/uL (ref 3.9–10.3)
lymph#: 1.7 10*3/uL (ref 0.9–3.3)

## 2014-05-08 LAB — COMPREHENSIVE METABOLIC PANEL (CC13)
ALBUMIN: 3.7 g/dL (ref 3.5–5.0)
ALT: 15 U/L (ref 0–55)
AST: 18 U/L (ref 5–34)
Alkaline Phosphatase: 57 U/L (ref 40–150)
Anion Gap: 11 mEq/L (ref 3–11)
BUN: 13.7 mg/dL (ref 7.0–26.0)
CALCIUM: 9.7 mg/dL (ref 8.4–10.4)
CO2: 27 meq/L (ref 22–29)
Chloride: 104 mEq/L (ref 98–109)
Creatinine: 0.8 mg/dL (ref 0.6–1.1)
GLUCOSE: 125 mg/dL (ref 70–140)
POTASSIUM: 3.8 meq/L (ref 3.5–5.1)
SODIUM: 141 meq/L (ref 136–145)
TOTAL PROTEIN: 6.6 g/dL (ref 6.4–8.3)
Total Bilirubin: 0.44 mg/dL (ref 0.20–1.20)

## 2014-05-08 NOTE — Telephone Encounter (Signed)
, °

## 2014-05-08 NOTE — Progress Notes (Signed)
Patient Care Team: Kelton Pillar, MD as PCP - General (Family Medicine) Christene Slates, MD as Physician Assistant (Radiology) Lovenia Kim, MD as Consulting Physician (Obstetrics and Gynecology) Lafayette Dragon, MD as Consulting Physician (Gastroenterology) Deatra Robinson, MD as Consulting Physician (Oncology) Alphonsa Overall, MD as Consulting Physician (General Surgery) Eppie Gibson, MD as Consulting Physician (Radiation Oncology) Alphonsa Overall, MD as Consulting Physician (General Surgery)  DIAGNOSIS: Breast cancer, right breast   Staging form: Breast, AJCC 7th Edition     Clinical: T1, N0 - Unsigned     Pathologic: Stage IA (T1c, N0, cM0) - Signed by Deatra Robinson, MD on 08/26/2013   Diagnosis: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  CHIEF COMPLIANT: followup of breast cancer  INTERVAL HISTORY: [name] is a 73 year old Caucasian with above-mentioned history of stage I ER positive breast cancer. She started tamoxifen and tolerating it fairly well apart from occasional hot flashes. She denies any other new complaints or concerns. She has some issues with vaginal dryness. Denies any lumps or nodules in breast.  REVIEW OF SYSTEMS:   Constitutional: Denies fevers, chills or abnormal weight loss Eyes: Denies blurriness of vision Ears, nose, mouth, throat, and face: Denies mucositis or sore throat Respiratory: Denies cough, dyspnea or wheezes Cardiovascular: Denies palpitation, chest discomfort or lower extremity swelling Gastrointestinal:  Denies nausea, heartburn or change in bowel habits Skin: Denies abnormal skin rashes Lymphatics: Denies new lymphadenopathy or easy bruising Neurological:Denies numbness, tingling or new weaknesses Behavioral/Psych: Mood is stable, no new changes  Breast:  denies any pain or lumps or nodules in either breasts All other systems were reviewed with the patient and are negative.  I have  reviewed the past medical history, past surgical history, social history and family history with the patient and they are unchanged from previous note.  ALLERGIES:  has No Known Allergies.  MEDICATIONS:  Current Outpatient Prescriptions  Medication Sig Dispense Refill  . Ascorbic Acid (VITAMIN C) 100 MG tablet Take 100 mg by mouth daily.     Marland Kitchen BIOTIN PO Take 1 tablet by mouth every morning.    Marland Kitchen CALCIUM PO Take 1 capsule by mouth 2 (two) times daily. 1500mg  twice a day, chews.    . cholecalciferol (VITAMIN D) 1000 UNITS tablet Take 1,000 Units by mouth 2 (two) times daily. D#3 type    . iron polysaccharides (NIFEREX) 150 MG capsule Take 150 mg by mouth daily. Pt takes medication once a week.    Javier Docker Oil 1000 MG CAPS Take 1 capsule by mouth daily.    Marland Kitchen lisinopril (PRINIVIL,ZESTRIL) 20 MG tablet Take 20 mg by mouth daily.    . Multiple Vitamin (MULTIVITAMIN) tablet Take 1 tablet by mouth 2 (two) times daily. gummies    . non-metallic deodorant (ALRA) MISC Apply 1 application topically daily as needed.    . Omega-3 Fatty Acids (FISH OIL) 1000 MG CAPS Take 1 capsule by mouth 2 (two) times daily.    . Probiotic Product (PHILLIPS COLON HEALTH PO) Take 1 capsule by mouth daily.    . ranitidine (ZANTAC) 150 MG tablet Take 1 tablet (150 mg total) by mouth at bedtime. 30 tablet 2  . tamoxifen (NOLVADEX) 20 MG tablet Take 1 tablet (20 mg total) by mouth daily. 90 tablet 1  . vitamin E 100 UNIT capsule Take 100 Units by mouth daily.     No current facility-administered medications for this visit.  PHYSICAL EXAMINATION: ECOG PERFORMANCE STATUS: 0 - Asymptomatic  Filed Vitals:   05/08/14 1430  BP: 134/50  Pulse: 108  Temp: 98.3 F (36.8 C)  Resp: 18   Filed Weights   05/08/14 1430  Weight: 206 lb 14.4 oz (93.849 kg)    GENERAL:alert, no distress and comfortable SKIN: skin color, texture, turgor are normal, no rashes or significant lesions EYES: normal, Conjunctiva are pink and  non-injected, sclera clear OROPHARYNX:no exudate, no erythema and lips, buccal mucosa, and tongue normal  NECK: supple, thyroid normal size, non-tender, without nodularity LYMPH:  no palpable lymphadenopathy in the cervical, axillary or inguinal LUNGS: clear to auscultation and percussion with normal breathing effort HEART: regular rate & rhythm and no murmurs and no lower extremity edema ABDOMEN:abdomen soft, non-tender and normal bowel sounds Musculoskeletal:no cyanosis of digits and no clubbing  NEURO: alert & oriented x 3 with fluent speech, no focal motor/sensory deficits  LABORATORY DATA:  I have reviewed the data as listed   Chemistry      Component Value Date/Time   NA 144 11/01/2013 1335   NA 144 07/26/2013 1042   K 4.1 11/01/2013 1335   K 4.5 07/26/2013 1042   CL 105 07/26/2013 1042   CO2 24 11/01/2013 1335   CO2 26 07/26/2013 1042   BUN 13.9 11/01/2013 1335   BUN 22 07/26/2013 1042   CREATININE 1.0 11/01/2013 1335   CREATININE 0.78 07/26/2013 1042      Component Value Date/Time   CALCIUM 10.5* 11/01/2013 1335   CALCIUM 9.6 07/26/2013 1042   ALKPHOS 85 11/01/2013 1335   ALKPHOS 69 12/27/2007 2335   AST 25 11/01/2013 1335   AST 20 12/27/2007 2335   ALT 22 11/01/2013 1335   ALT 14 12/27/2007 2335   BILITOT 0.39 11/01/2013 1335   BILITOT 1.3* 12/27/2007 2335       Lab Results  Component Value Date   WBC 8.1 05/08/2014   HGB 14.0 05/08/2014   HCT 43.4 05/08/2014   MCV 91.8 05/08/2014   PLT 230 05/08/2014   NEUTROABS 5.6 05/08/2014     RADIOGRAPHIC STUDIES: I have personally reviewed the radiology reports and agreed with their findings. No results found.   ASSESSMENT & PLAN:  Malignant neoplasm of upper inner quadrant of female breast T1No invasive lobular carcinoma of right breast: post lumpectomy with sentinel nodes, local radiation and is taking Tamoxifen daily that was started 11/01/13.   tamoxifen toxicities: Apart from occasional hot flashes no  major complications. Patient was started on tamoxifen because of osteopenia. The plan of treatment is 5 years. At that time to make a decision whether prolonged antiestrogen therapy would be beneficial. Surveillance: Continue with mammogram surveillance in December of year. Breast exam done 3 months ago was normal.  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  Return to clinic in 6 months for followup with labs.   Orders Placed This Encounter  Procedures  . CBC with Differential    Standing Status: Future     Number of Occurrences:      Standing Expiration Date: 05/08/2015  . Comprehensive metabolic panel (Cmet) - [facility]    Standing Status: Future     Number of Occurrences:      Standing Expiration Date: 05/08/2015  . Ferritin    Standing Status: Future     Number of Occurrences:      Standing Expiration Date: 05/08/2015  .  Iron and TIBC [facility]    Standing Status: Future     Number of Occurrences:      Standing Expiration Date: 05/08/2015   The patient has a good understanding of the overall plan. she agrees with it. She will call with any problems that may develop before her next visit here.   Rulon Eisenmenger, MD 05/08/2014 2:58 PM

## 2014-05-08 NOTE — Assessment & Plan Note (Signed)
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   tamoxifen toxicities: Apart from occasional hot flashes no major complications. Patient was started on tamoxifen because of osteopenia. The plan of treatment is 5 years. At that time to make a decision whether prolonged antiestrogen therapy would be beneficial. Surveillance: Continue with mammogram surveillance in December of year. Breast exam done 3 months ago was normal.  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  Return to clinic in 6 months for followup with labs.

## 2014-05-09 LAB — IRON AND TIBC CHCC
%SAT: 18 % — ABNORMAL LOW (ref 21–57)
IRON: 64 ug/dL (ref 41–142)
TIBC: 354 ug/dL (ref 236–444)
UIBC: 289 ug/dL (ref 120–384)

## 2014-05-09 LAB — FERRITIN CHCC: Ferritin: 16 ng/ml (ref 9–269)

## 2014-05-14 ENCOUNTER — Encounter: Payer: Self-pay | Admitting: Hematology and Oncology

## 2014-05-15 NOTE — Telephone Encounter (Deleted)
Patient received response from PCP.

## 2014-05-20 NOTE — Progress Notes (Signed)
Returned pt call - gave her name and phone no for Dr. Migdalia Dk and Dr. Iran Planas.  Pt voiced understanding.

## 2014-05-24 DIAGNOSIS — Z923 Personal history of irradiation: Secondary | ICD-10-CM

## 2014-05-24 DIAGNOSIS — Z853 Personal history of malignant neoplasm of breast: Secondary | ICD-10-CM | POA: Insufficient documentation

## 2014-05-24 DIAGNOSIS — Z9882 Breast implant status: Secondary | ICD-10-CM | POA: Insufficient documentation

## 2014-06-19 ENCOUNTER — Encounter (HOSPITAL_BASED_OUTPATIENT_CLINIC_OR_DEPARTMENT_OTHER): Payer: Self-pay | Admitting: *Deleted

## 2014-06-19 NOTE — Progress Notes (Signed)
To come in for bmet-bring all meds and overnight bag

## 2014-06-20 ENCOUNTER — Encounter (HOSPITAL_BASED_OUTPATIENT_CLINIC_OR_DEPARTMENT_OTHER)
Admission: RE | Admit: 2014-06-20 | Discharge: 2014-06-20 | Disposition: A | Discharge status: Home / Self Care | Payer: Medicare Other | Source: Ambulatory Visit | Attending: Plastic Surgery | Admitting: Plastic Surgery

## 2014-06-20 DIAGNOSIS — Y929 Unspecified place or not applicable: Secondary | ICD-10-CM | POA: Diagnosis not present

## 2014-06-20 DIAGNOSIS — K219 Gastro-esophageal reflux disease without esophagitis: Secondary | ICD-10-CM | POA: Diagnosis not present

## 2014-06-20 DIAGNOSIS — Y839 Surgical procedure, unspecified as the cause of abnormal reaction of the patient, or of later complication, without mention of misadventure at the time of the procedure: Secondary | ICD-10-CM | POA: Diagnosis not present

## 2014-06-20 DIAGNOSIS — I10 Essential (primary) hypertension: Secondary | ICD-10-CM | POA: Diagnosis not present

## 2014-06-20 DIAGNOSIS — Z853 Personal history of malignant neoplasm of breast: Secondary | ICD-10-CM | POA: Diagnosis not present

## 2014-06-20 DIAGNOSIS — F329 Major depressive disorder, single episode, unspecified: Secondary | ICD-10-CM | POA: Diagnosis not present

## 2014-06-20 DIAGNOSIS — T8589XA Other specified complication of internal prosthetic devices, implants and grafts, not elsewhere classified, initial encounter: Secondary | ICD-10-CM | POA: Diagnosis not present

## 2014-06-20 LAB — BASIC METABOLIC PANEL
Anion gap: 9 (ref 5–15)
BUN: 12 mg/dL (ref 6–23)
CALCIUM: 9.4 mg/dL (ref 8.4–10.5)
CO2: 28 mmol/L (ref 19–32)
Chloride: 102 mEq/L (ref 96–112)
Creatinine, Ser: 0.73 mg/dL (ref 0.50–1.10)
GFR, EST NON AFRICAN AMERICAN: 83 mL/min — AB (ref >90)
Glucose, Bld: 96 mg/dL (ref 70–99)
Potassium: 3.9 mmol/L (ref 3.5–5.1)
Sodium: 139 mmol/L (ref 135–145)

## 2014-06-20 NOTE — H&P (Signed)
Subjective:    Patient ID: [name] is a 73 y.o. female.  HPI  Patient here for follow up discussion of breast reconstruction. Patient has history of bilateral subglandular augmentation in 1986. She does not have record of implant size. Reports she went from A cup to C cup. Prior to cancer diagnosis, wearing D cup with padded bra. Noted on MRI to have bilateral rupture. Diagnosed with R breast cancer and R arm melanoma early this year and underwent Right lumpectomy with removal ruptured implant, SLN, and WLE melanoma arm. Completed XRT to right breast 09/2013. On adjuvant tamoxifen; low oncotype and no chemotherapy. Using OTC insert for bra but still asymmetric and feels shoulders off center, bra strap falls off. Desires to return to C/D cup size.  She is accompanied by husband today.   Past Medical History  Diagnosis Date  . Breast cancer ([place])   . Hypertension     Past Surgical History  Procedure Laterality Date  . Augmentation mammaplasty  8/92/1194    silicone, Dr. Jeanella Flattery  . Chin augmentation, liposuction abdomen, flanks, knees  07/1985  . Removal chin implant  08/1985  . Liposuction abdomen, flanks, upper arms  06/2003  . Facelift  04/15/2008  . Nasal reconstuction/rhinoplasty  09/04/1984  . Appendectomy    . Tubal ligation    . Right lumpectomy, removal implant  07/2013         Objective:    Physical Exam  Cardiovascular: Normal rate, regular rhythm and normal heart sounds.  Pulmonary/Chest: Effort normal and breath sounds normal.  Genitourinary: No breast tenderness or discharge.  Left breast with snoopy deformity, Grade 2 contracture, grade 2 ptosis Right breast soft, bilateral IMF scars, left upper inner scar Right breast significantly smaller volume than left,  SN to nipple R 23.5 L 27.5 cm BW R 17 L 18.5 Nipple to IMF 8 L 10 cm    Grade 1 ptosis on right     Assessment:      Breast cancer, post lumpectomy/XRT History of bilateral breast  augmentation, ruptured     Plan:      Reviewed that all risks of reconstruction increase post radiation, including contracture, extrusion, wound healing problems, seroma, quoted 17% chance of complications. Plan removal of left breast ruptured implant and bilateral augmentation with silicone, implant site to bechanged to submuscular bilaterally. On left she will require mastopexy with anchor type scars. It is a possibility on right that she will need mastopexy but as she notes she has contracted her soft tissue envelop considerably. Patient herself disagrees with this statement and does not feel she has any contraction of her breast. I counseled that she has significant asymmetry of nipple position with small ptosis on right vs left. She feels this is all related to implant and if this was removed she would not have this ptosis on left. Counseled that this is not the case for overwhelming majority of patients with implants. Reviewed risks wound healing problems and dehiscence superficial mastopexy scars at T junctions, risk of nipple loss, diminished sensation.  Reviewed that she has significant asymmetry presently and will still have some asymmetry with any procedure. As we do not know her prior implant size, will have to make judgement intraop on implant sizes. Counseled that there are many implant sizes and projections and we cannot have the whole catalog available for use, I have ordered on my experience what I feel are appropriate.   Counseled rippling, feel of implant will be more apparent  on right. She asked about fat grafting to fill lumpectomy cavity on right, counseled I would not consider this at this time and would see outcome of this surgery. Fat grafting can be done in future, but will affect her cancer surveillance by producing cysts, calcifications in area of lumpectomy.  Plan overnight stay and discussed bilateral drain, post op compression, expected pain. Patient has had significant  nausea post op. Rx given for clindamycin, valium, and norco. Instructed on stool softener/laxative use while taking any narcotics.   Irene Limbo, MD [facility] [phone]

## 2014-06-25 ENCOUNTER — Encounter (HOSPITAL_BASED_OUTPATIENT_CLINIC_OR_DEPARTMENT_OTHER): Payer: Self-pay | Admitting: *Deleted

## 2014-06-25 ENCOUNTER — Ambulatory Visit (HOSPITAL_BASED_OUTPATIENT_CLINIC_OR_DEPARTMENT_OTHER)
Admission: RE | Admit: 2014-06-25 | Discharge: 2014-06-26 | Disposition: A | Discharge status: Home / Self Care | Payer: Medicare Other | Source: Ambulatory Visit | Attending: Plastic Surgery | Admitting: Plastic Surgery

## 2014-06-25 ENCOUNTER — Ambulatory Visit (HOSPITAL_BASED_OUTPATIENT_CLINIC_OR_DEPARTMENT_OTHER): Payer: Medicare Other | Admitting: Anesthesiology

## 2014-06-25 ENCOUNTER — Encounter (HOSPITAL_BASED_OUTPATIENT_CLINIC_OR_DEPARTMENT_OTHER)
Admission: RE | Disposition: A | Discharge status: Home / Self Care | Payer: Self-pay | Source: Ambulatory Visit | Attending: Plastic Surgery

## 2014-06-25 DIAGNOSIS — I10 Essential (primary) hypertension: Secondary | ICD-10-CM | POA: Diagnosis not present

## 2014-06-25 DIAGNOSIS — Y839 Surgical procedure, unspecified as the cause of abnormal reaction of the patient, or of later complication, without mention of misadventure at the time of the procedure: Secondary | ICD-10-CM | POA: Insufficient documentation

## 2014-06-25 DIAGNOSIS — T8589XA Other specified complication of internal prosthetic devices, implants and grafts, not elsewhere classified, initial encounter: Secondary | ICD-10-CM | POA: Diagnosis not present

## 2014-06-25 DIAGNOSIS — F329 Major depressive disorder, single episode, unspecified: Secondary | ICD-10-CM | POA: Insufficient documentation

## 2014-06-25 DIAGNOSIS — K219 Gastro-esophageal reflux disease without esophagitis: Secondary | ICD-10-CM | POA: Diagnosis not present

## 2014-06-25 DIAGNOSIS — Y929 Unspecified place or not applicable: Secondary | ICD-10-CM | POA: Insufficient documentation

## 2014-06-25 DIAGNOSIS — Z853 Personal history of malignant neoplasm of breast: Secondary | ICD-10-CM | POA: Insufficient documentation

## 2014-06-25 HISTORY — PX: BREAST ENHANCEMENT SURGERY: SHX7

## 2014-06-25 HISTORY — PX: MASTOPEXY: SHX5358

## 2014-06-25 HISTORY — PX: BREAST IMPLANT REMOVAL: SHX5361

## 2014-06-25 HISTORY — DX: Presence of spectacles and contact lenses: Z97.3

## 2014-06-25 LAB — POCT HEMOGLOBIN-HEMACUE: Hemoglobin: 12.8 g/dL (ref 12.0–15.0)

## 2014-06-25 SURGERY — REMOVAL, IMPLANT, BREAST
Anesthesia: General | Site: Breast | Laterality: Left

## 2014-06-25 MED ORDER — CEFAZOLIN SODIUM 1-5 GM-% IV SOLN
1.0000 g | Freq: Three times a day (TID) | INTRAVENOUS | Status: AC
  Administered 2014-06-25 – 2014-06-26 (×3): 1 g via INTRAVENOUS
  Filled 2014-06-25: qty 50

## 2014-06-25 MED ORDER — MIDAZOLAM HCL 5 MG/5ML IJ SOLN
INTRAMUSCULAR | Status: DC | PRN
  Administered 2014-06-25: 2 mg via INTRAVENOUS

## 2014-06-25 MED ORDER — ONDANSETRON HCL 4 MG PO TABS: 4.0000 mg | ORAL_TABLET | Freq: Four times a day (QID) | ORAL | Status: DC | PRN

## 2014-06-25 MED ORDER — LIDOCAINE HCL (CARDIAC) 20 MG/ML IV SOLN
INTRAVENOUS | Status: DC | PRN
  Administered 2014-06-25: 75 mg via INTRAVENOUS

## 2014-06-25 MED ORDER — ONDANSETRON HCL 4 MG/2ML IJ SOLN
INTRAMUSCULAR | Status: DC | PRN
  Administered 2014-06-25: 4 mg via INTRAVENOUS

## 2014-06-25 MED ORDER — FAMOTIDINE 20 MG PO TABS
20.0000 mg | ORAL_TABLET | Freq: Every day | ORAL | Status: DC
Start: 2014-06-25 — End: 2014-06-26
  Administered 2014-06-25: 20 mg via ORAL

## 2014-06-25 MED ORDER — HYDROMORPHONE HCL 1 MG/ML IJ SOLN
INTRAMUSCULAR | Status: AC
  Filled 2014-06-25: qty 1

## 2014-06-25 MED ORDER — HYDROMORPHONE HCL 1 MG/ML IJ SOLN
0.2500 mg | INTRAMUSCULAR | Status: DC | PRN
  Administered 2014-06-25 (×2): 0.5 mg via INTRAVENOUS

## 2014-06-25 MED ORDER — ONDANSETRON HCL 4 MG/2ML IJ SOLN: 4.0000 mg | Freq: Four times a day (QID) | INTRAMUSCULAR | Status: DC | PRN

## 2014-06-25 MED ORDER — EPHEDRINE SULFATE 50 MG/ML IJ SOLN
INTRAMUSCULAR | Status: DC | PRN
  Administered 2014-06-25: 15 mg via INTRAVENOUS

## 2014-06-25 MED ORDER — DIAZEPAM 5 MG PO TABS
5.0000 mg | ORAL_TABLET | Freq: Three times a day (TID) | ORAL | Status: DC | PRN
  Administered 2014-06-25: 5 mg via ORAL
  Filled 2014-06-25: qty 1

## 2014-06-25 MED ORDER — NEOSTIGMINE METHYLSULFATE 10 MG/10ML IV SOLN
INTRAVENOUS | Status: DC | PRN
  Administered 2014-06-25: 2 mg via INTRAVENOUS

## 2014-06-25 MED ORDER — SUCCINYLCHOLINE CHLORIDE 20 MG/ML IJ SOLN
INTRAMUSCULAR | Status: DC | PRN
  Administered 2014-06-25: 100 mg via INTRAVENOUS

## 2014-06-25 MED ORDER — HYDROCODONE-ACETAMINOPHEN 5-325 MG PO TABS
1.0000 | ORAL_TABLET | ORAL | Status: DC | PRN
  Administered 2014-06-25 – 2014-06-26 (×4): 2 via ORAL
  Filled 2014-06-25 (×4): qty 2

## 2014-06-25 MED ORDER — TAMOXIFEN CITRATE 20 MG PO TABS
20.0000 mg | ORAL_TABLET | Freq: Every day | ORAL | Status: DC
  Administered 2014-06-25: 20 mg via ORAL

## 2014-06-25 MED ORDER — OXYCODONE HCL 5 MG PO TABS: 5.0000 mg | ORAL_TABLET | Freq: Once | ORAL | Status: DC | PRN

## 2014-06-25 MED ORDER — SODIUM CHLORIDE 0.9 % IJ SOLN
INTRAMUSCULAR | Status: AC
  Filled 2014-06-25: qty 60

## 2014-06-25 MED ORDER — FENTANYL CITRATE 0.05 MG/ML IJ SOLN: 50.0000 ug | INTRAMUSCULAR | Status: DC | PRN

## 2014-06-25 MED ORDER — PROPOFOL 10 MG/ML IV BOLUS
INTRAVENOUS | Status: DC | PRN
  Administered 2014-06-25: 200 mg via INTRAVENOUS

## 2014-06-25 MED ORDER — KCL IN DEXTROSE-NACL 20-5-0.45 MEQ/L-%-% IV SOLN
INTRAVENOUS | Status: DC
  Administered 2014-06-25: 13:00:00 via INTRAVENOUS
  Filled 2014-06-25: qty 1000

## 2014-06-25 MED ORDER — FENTANYL CITRATE 0.05 MG/ML IJ SOLN
INTRAMUSCULAR | Status: AC
  Filled 2014-06-25: qty 8

## 2014-06-25 MED ORDER — SCOPOLAMINE 1 MG/3DAYS TD PT72
MEDICATED_PATCH | TRANSDERMAL | Status: DC | PRN
  Administered 2014-06-25: 1 via TRANSDERMAL

## 2014-06-25 MED ORDER — LACTATED RINGERS IV SOLN
INTRAVENOUS | Status: DC
  Administered 2014-06-25 (×3): via INTRAVENOUS

## 2014-06-25 MED ORDER — CEFAZOLIN SODIUM-DEXTROSE 2-3 GM-% IV SOLR
2.0000 g | INTRAVENOUS | Status: AC
  Administered 2014-06-25: 2 g via INTRAVENOUS

## 2014-06-25 MED ORDER — PROMETHAZINE HCL 25 MG/ML IJ SOLN: 6.2500 mg | INTRAMUSCULAR | Status: DC | PRN

## 2014-06-25 MED ORDER — CEFAZOLIN SODIUM 1-5 GM-% IV SOLN
INTRAVENOUS | Status: AC
  Filled 2014-06-25: qty 50

## 2014-06-25 MED ORDER — CHLORHEXIDINE GLUCONATE 4 % EX LIQD: 1.0000 "application " | Freq: Once | CUTANEOUS | Status: DC

## 2014-06-25 MED ORDER — OXYCODONE HCL 5 MG/5ML PO SOLN: 5.0000 mg | Freq: Once | ORAL | Status: DC | PRN

## 2014-06-25 MED ORDER — LISINOPRIL 20 MG PO TABS: 20.0000 mg | ORAL_TABLET | Freq: Every day | ORAL | Status: DC

## 2014-06-25 MED ORDER — GLYCOPYRROLATE 0.2 MG/ML IJ SOLN
INTRAMUSCULAR | Status: DC | PRN
  Administered 2014-06-25: .4 mg via INTRAVENOUS

## 2014-06-25 MED ORDER — HYDROMORPHONE HCL 1 MG/ML IJ SOLN: 0.5000 mg | INTRAMUSCULAR | Status: DC | PRN

## 2014-06-25 MED ORDER — 0.9 % SODIUM CHLORIDE (POUR BTL) OPTIME
TOPICAL | Status: DC | PRN
  Administered 2014-06-25: 1000 mL

## 2014-06-25 MED ORDER — POVIDONE-IODINE 10 % EX SOLN
CUTANEOUS | Status: DC | PRN
  Administered 2014-06-25: 300 via TOPICAL

## 2014-06-25 MED ORDER — PHENYLEPHRINE HCL 10 MG/ML IJ SOLN
INTRAMUSCULAR | Status: DC | PRN
  Administered 2014-06-25 (×2): 40 ug via INTRAVENOUS

## 2014-06-25 MED ORDER — MIDAZOLAM HCL 2 MG/2ML IJ SOLN
INTRAMUSCULAR | Status: AC
  Filled 2014-06-25: qty 2

## 2014-06-25 MED ORDER — LIDOCAINE-EPINEPHRINE 1 %-1:100000 IJ SOLN
INTRAMUSCULAR | Status: AC
  Filled 2014-06-25: qty 2

## 2014-06-25 MED ORDER — ROCURONIUM BROMIDE 100 MG/10ML IV SOLN
INTRAVENOUS | Status: DC | PRN
  Administered 2014-06-25: 20 mg via INTRAVENOUS
  Administered 2014-06-25 (×3): 10 mg via INTRAVENOUS

## 2014-06-25 MED ORDER — GENTAMICIN SULFATE 40 MG/ML IJ SOLN
INTRAMUSCULAR | Status: DC | PRN
  Administered 2014-06-25: 1000 mL

## 2014-06-25 MED ORDER — FENTANYL CITRATE 0.05 MG/ML IJ SOLN
INTRAMUSCULAR | Status: AC
  Filled 2014-06-25: qty 6

## 2014-06-25 MED ORDER — DEXAMETHASONE SODIUM PHOSPHATE 4 MG/ML IJ SOLN
INTRAMUSCULAR | Status: DC | PRN
  Administered 2014-06-25: 10 mg via INTRAVENOUS

## 2014-06-25 MED ORDER — FENTANYL CITRATE 0.05 MG/ML IJ SOLN
INTRAMUSCULAR | Status: DC | PRN
  Administered 2014-06-25 (×5): 50 ug via INTRAVENOUS
  Administered 2014-06-25 (×2): 25 ug via INTRAVENOUS
  Administered 2014-06-25: 100 ug via INTRAVENOUS

## 2014-06-25 MED ORDER — MIDAZOLAM HCL 2 MG/2ML IJ SOLN: 1.0000 mg | INTRAMUSCULAR | Status: DC | PRN

## 2014-06-25 MED ORDER — BUPIVACAINE-EPINEPHRINE (PF) 0.25% -1:200000 IJ SOLN
INTRAMUSCULAR | Status: AC
  Filled 2014-06-25: qty 60

## 2014-06-25 MED ORDER — CEFAZOLIN SODIUM-DEXTROSE 2-3 GM-% IV SOLR
INTRAVENOUS | Status: AC
  Filled 2014-06-25: qty 50

## 2014-06-25 SURGICAL SUPPLY — 95 items
BAG DECANTER FOR FLEXI CONT (MISCELLANEOUS) ×4 IMPLANT
BANDAGE ELASTIC 6 VELCRO ST LF (GAUZE/BANDAGES/DRESSINGS) IMPLANT
BINDER BREAST LRG (GAUZE/BANDAGES/DRESSINGS) IMPLANT
BINDER BREAST MEDIUM (GAUZE/BANDAGES/DRESSINGS) IMPLANT
BINDER BREAST XLRG (GAUZE/BANDAGES/DRESSINGS) ×2 IMPLANT
BINDER BREAST XXLRG (GAUZE/BANDAGES/DRESSINGS) IMPLANT
BIOPATCH RED 1 DISK 7.0 (GAUZE/BANDAGES/DRESSINGS) IMPLANT
BIOPATCH RED 1IN DISK 7.0MM (GAUZE/BANDAGES/DRESSINGS)
BLADE SURG 10 STRL SS (BLADE) ×4 IMPLANT
BLADE SURG 15 STRL LF DISP TIS (BLADE) ×2 IMPLANT
BLADE SURG 15 STRL SS (BLADE) ×4
BNDG GAUZE ELAST 4 BULKY (GAUZE/BANDAGES/DRESSINGS) ×8 IMPLANT
CANISTER SUCT 1200ML W/VALVE (MISCELLANEOUS) ×4 IMPLANT
CANISTER SUCTION 1200CC (MISCELLANEOUS) ×8 IMPLANT
CHLORAPREP W/TINT 26ML (MISCELLANEOUS) ×4 IMPLANT
CLOSURE WOUND 1/2 X4 (GAUZE/BANDAGES/DRESSINGS)
COVER BACK TABLE 60X90IN (DRAPES) ×4 IMPLANT
COVER MAYO STAND STRL (DRAPES) ×4 IMPLANT
DECANTER SPIKE VIAL GLASS SM (MISCELLANEOUS) IMPLANT
DRAIN CHANNEL 15F RND FF W/TCR (WOUND CARE) ×6 IMPLANT
DRAIN CHANNEL 19F RND (DRAIN) IMPLANT
DRAPE INCISE IOBAN 66X45 STRL (DRAPES) ×2 IMPLANT
DRAPE LAPAROSCOPIC ABDOMINAL (DRAPES) ×4 IMPLANT
DRSG PAD ABDOMINAL 8X10 ST (GAUZE/BANDAGES/DRESSINGS) ×6 IMPLANT
DRSG TEGADERM 2-3/8X2-3/4 SM (GAUZE/BANDAGES/DRESSINGS) ×8 IMPLANT
DRSG TEGADERM 4X4.75 (GAUZE/BANDAGES/DRESSINGS) ×2 IMPLANT
ELECT BLADE 4.0 EZ CLEAN MEGAD (MISCELLANEOUS) ×4
ELECT BLADE 6.5 .24CM SHAFT (ELECTRODE) IMPLANT
ELECT COATED BLADE 2.86 ST (ELECTRODE) ×4 IMPLANT
ELECT REM PT RETURN 9FT ADLT (ELECTROSURGICAL) ×4
ELECTRODE BLDE 4.0 EZ CLN MEGD (MISCELLANEOUS) ×2 IMPLANT
ELECTRODE REM PT RTRN 9FT ADLT (ELECTROSURGICAL) ×2 IMPLANT
EVACUATOR SILICONE 100CC (DRAIN) ×6 IMPLANT
GAUZE SPONGE 4X4 12PLY STRL (GAUZE/BANDAGES/DRESSINGS) IMPLANT
GLOVE BIO SURGEON STRL SZ 6 (GLOVE) ×4 IMPLANT
GLOVE BIO SURGEON STRL SZ 6.5 (GLOVE) IMPLANT
GLOVE BIO SURGEONS STRL SZ 6.5 (GLOVE)
GLOVE BIOGEL PI IND STRL 7.0 (GLOVE) IMPLANT
GLOVE BIOGEL PI INDICATOR 7.0 (GLOVE) ×8
GLOVE ECLIPSE 6.5 STRL STRAW (GLOVE) ×8 IMPLANT
GOWN STRL REUS W/ TWL LRG LVL3 (GOWN DISPOSABLE) ×4 IMPLANT
GOWN STRL REUS W/TWL LRG LVL3 (GOWN DISPOSABLE) ×8
IMPLANT BREAST GEL 320CC (Breast) ×2 IMPLANT
IMPLANT BREAST GEL 425CC (Breast) ×2 IMPLANT
IV NS 1000ML (IV SOLUTION)
IV NS 1000ML BAXH (IV SOLUTION) IMPLANT
IV NS 500ML (IV SOLUTION) ×4
IV NS 500ML BAXH (IV SOLUTION) ×2 IMPLANT
KIT FILL SYSTEM UNIVERSAL (SET/KITS/TRAYS/PACK) IMPLANT
LIQUID BAND (GAUZE/BANDAGES/DRESSINGS) ×4 IMPLANT
NDL HYPO 25X1 1.5 SAFETY (NEEDLE) IMPLANT
NDL SAFETY ECLIPSE 18X1.5 (NEEDLE) IMPLANT
NEEDLE HYPO 18GX1.5 SHARP (NEEDLE)
NEEDLE HYPO 25X1 1.5 SAFETY (NEEDLE) IMPLANT
NS IRRIG 1000ML POUR BTL (IV SOLUTION) ×6 IMPLANT
PACK BASIN DAY SURGERY FS (CUSTOM PROCEDURE TRAY) ×4 IMPLANT
PACK UNIVERSAL I (CUSTOM PROCEDURE TRAY) IMPLANT
PENCIL BUTTON HOLSTER BLD 10FT (ELECTRODE) ×4 IMPLANT
PIN SAFETY STERILE (MISCELLANEOUS) ×4 IMPLANT
SIZER BREAST GEL 425CC (SIZER) ×2 IMPLANT
SIZER BREAST GEL REUSE 320CC (SIZER) ×2 IMPLANT
SIZER BREAST GEL REUSE 340CC (SIZER) ×2 IMPLANT
SIZER BREAST GEL REUSE 360CC (SIZER) ×4
SIZER BREAST GEL REUSE 375CC (SIZER) ×2 IMPLANT
SIZER BREAST GENERIC (SIZER) ×10 IMPLANT
SIZER BRST GEL REUSE 360CC (SIZER) IMPLANT
SLEEVE SCD COMPRESS KNEE MED (MISCELLANEOUS) ×4 IMPLANT
SPONGE GAUZE 4X4 12PLY STER LF (GAUZE/BANDAGES/DRESSINGS) IMPLANT
SPONGE LAP 18X18 X RAY DECT (DISPOSABLE) ×8 IMPLANT
STAPLER VISISTAT 35W (STAPLE) ×6 IMPLANT
STRIP CLOSURE SKIN 1/2X4 (GAUZE/BANDAGES/DRESSINGS) IMPLANT
SUT ETHILON 2 0 FS 18 (SUTURE) ×4 IMPLANT
SUT MNCRL AB 4-0 PS2 18 (SUTURE) ×8 IMPLANT
SUT MON AB 3-0 SH 27 (SUTURE)
SUT MON AB 3-0 SH27 (SUTURE) IMPLANT
SUT MON AB 5-0 PS2 18 (SUTURE) IMPLANT
SUT PDS 3-0 CT2 (SUTURE)
SUT PDS AB 2-0 CT2 27 (SUTURE) ×2 IMPLANT
SUT PDS II 3-0 CT2 27 ABS (SUTURE) IMPLANT
SUT SILK 3 0 PS 1 (SUTURE) IMPLANT
SUT VIC AB 3-0 PS1 18 (SUTURE) ×4
SUT VIC AB 3-0 PS1 18XBRD (SUTURE) ×2 IMPLANT
SUT VIC AB 3-0 SH 27 (SUTURE) ×12
SUT VIC AB 3-0 SH 27X BRD (SUTURE) IMPLANT
SUT VICRYL 4-0 PS2 18IN ABS (SUTURE) ×4 IMPLANT
SWAB COLLECTION DEVICE MRSA (MISCELLANEOUS) IMPLANT
SYR 50ML LL SCALE MARK (SYRINGE) IMPLANT
SYR BULB IRRIGATION 50ML (SYRINGE) ×4 IMPLANT
SYR CONTROL 10ML LL (SYRINGE) IMPLANT
TOWEL OR 17X24 6PK STRL BLUE (TOWEL DISPOSABLE) ×8 IMPLANT
TUBE ANAEROBIC SPECIMEN COL (MISCELLANEOUS) IMPLANT
TUBE CONNECTING 20'X1/4 (TUBING) ×2
TUBE CONNECTING 20X1/4 (TUBING) ×4 IMPLANT
UNDERPAD 30X30 INCONTINENT (UNDERPADS AND DIAPERS) ×8 IMPLANT
YANKAUER SUCT BULB TIP NO VENT (SUCTIONS) ×4 IMPLANT

## 2014-06-25 NOTE — Interval H&P Note (Signed)
History and Physical Interval Note:  06/25/2014 7:04 AM  [name]  has presented today for surgery, with the diagnosis of HX OF BREAST CANCER LEFT BREAST IMPLANT RUPTURE AND ASYMMETRY BREAST   The various methods of treatment have been discussed with the patient and family. After consideration of risks, benefits and other options for treatment, the patient has consented to  Procedure(s): REMOVAL LEFT BREAST IMPLANT AND MATERIAL,  (Left) LEFT BREAST AUGMENTATION WITH SILICONE,  RIGHT BREAST AUGMENTATION (Bilateral) LEFT BREAST MASTOPEXY AND POSSIBLE RIGHT BREAST MASTOPEXY (Bilateral) as a surgical intervention .  The patient's history has been reviewed, patient examined, no change in status, stable for surgery.  I have reviewed the patient's chart and labs.  Questions were answered to the patient's satisfaction.     [name]

## 2014-06-25 NOTE — Transfer of Care (Signed)
Immediate Anesthesia Transfer of Care Note  Patient: [name]  Procedure(s) Performed: Procedure(s): REMOVAL LEFT BREAST IMPLANT AND MATERIAL,  (Left) LEFT BREAST AUGMENTATION WITH SILICONE,  RIGHT BREAST AUGMENTATION (Bilateral) LEFT BREAST MASTOPEXY  (Bilateral)  Patient Location: PACU  Anesthesia Type:General  Level of Consciousness: awake, alert , oriented and patient cooperative  Airway & Oxygen Therapy: Patient Spontanous Breathing and Patient connected to face mask oxygen  Post-op Assessment: Report given to PACU RN and Post -op Vital signs reviewed and stable  Post vital signs: Reviewed and stable  Complications: No apparent anesthesia complications

## 2014-06-25 NOTE — Anesthesia Preprocedure Evaluation (Addendum)
Anesthesia Evaluation  Patient identified by MRN, date of birth, ID band Patient awake    Reviewed: Allergy & Precautions, NPO status , Patient's Chart, lab work & pertinent test results  History of Anesthesia Complications (+) PONV  Airway Mallampati: III  TM Distance: >3 FB Neck ROM: Full    Dental   Pulmonary neg pulmonary ROS,          Cardiovascular hypertension, Pt. on medications     Neuro/Psych Depression negative neurological ROS     GI/Hepatic Neg liver ROS, hiatal hernia, GERD-  ,  Endo/Other  Morbid obesity  Renal/GU negative Renal ROS     Musculoskeletal   Abdominal   Peds  Hematology negative hematology ROS (+)   Anesthesia Other Findings   Reproductive/Obstetrics                            Anesthesia Physical Anesthesia Plan  ASA: II  Anesthesia Plan: General   Post-op Pain Management:    Induction: Intravenous  Airway Management Planned: LMA  Additional Equipment:   Intra-op Plan:   Post-operative Plan:   Informed Consent: I have reviewed the patients History and Physical, chart, labs and discussed the procedure including the risks, benefits and alternatives for the proposed anesthesia with the patient or authorized representative who has indicated his/her understanding and acceptance.   Dental advisory given  Plan Discussed with: CRNA  Anesthesia Plan Comments:         Anesthesia Quick Evaluation

## 2014-06-25 NOTE — Anesthesia Procedure Notes (Signed)
Procedure Name: Intubation Date/Time: 06/25/2014 7:31 AM Performed by: Melynda Ripple D Pre-anesthesia Checklist: Patient identified, Emergency Drugs available, Suction available and Patient being monitored Patient Re-evaluated:Patient Re-evaluated prior to inductionOxygen Delivery Method: Circle System Utilized Preoxygenation: Pre-oxygenation with 100% oxygen Intubation Type: IV induction Ventilation: Mask ventilation without difficulty Laryngoscope Size: Mac and 3 Grade View: Grade I Tube type: Oral Number of attempts: 1 Airway Equipment and Method: stylet and oral airway Placement Confirmation: ETT inserted through vocal cords under direct vision,  positive ETCO2 and breath sounds checked- equal and bilateral Tube secured with: Tape Dental Injury: Teeth and Oropharynx as per pre-operative assessment

## 2014-06-25 NOTE — Op Note (Signed)
Operative Note   DATE OF OPERATION: 1.5.2016  LOCATION: [place]- observation  SURGICAL DIVISION: Plastic Surgery  PREOPERATIVE DIAGNOSES:  1. History right breast cancer 2. History of breast augmentation 3. Ruptured left breast silicone implant 4, History therapeautic radiation to right breast  POSTOPERATIVE DIAGNOSES:  same  PROCEDURE:  1. Right breast augmentation 2. Left breast removal ruptured silicone implant with complete capsulectomy 3. Left breast submuscular augmentation 4. Left mastopexy  SURGEON: Irene Limbo MD MBA  ASSISTANT: none  ANESTHESIA:  General.   EBL: 408 ml  COMPLICATIONS: None.   INDICATIONS FOR PROCEDURE:  The patient, [name], is a 74 y.o. female born on [birth year], is here for breast reconstruction following removal ruptured subglandular implant and lumpectomy for treatment of breast cancer. This was followed by right breast radiation. She has known left breast implant rupture. She desires a return to her pre-cancer surgery breast volume and desires replacement of both implants. She presents with left breast ptosis ("snoopy deformity") and asymmetry of nipple position.   FINDINGS: Right breast : Mentor Siltex Round Moderate Plus 425 ml implant Ref [id number] SN [id number] Left Breast: Mentor Siltex Round Moderate Classic 320 ml implant Ref [id number], SN [id number]. Gross intracapsular rupture of left breast implant. By volume displacement, left breast implant approximately 250-300 ml. No visible marking on implant noted upon removal.   DESCRIPTION OF PROCEDURE:  The patient was marked in the preoperative area, standing position to mark sternal notch, chest midline, breast meridians, and anterior axillary lines. The patient was taken to the operating room. SCDs were placed and IV antibiotics were given. The patient's operative site was prepped and draped in a sterile fashion. A time out was performed and all information was confirmed to  be correct.  Incision made through prior right inframammary fold scar and carried through subcutaneous tissue and superficial fascia to lateral border of pectoralis muscle. Of note, muscle on this side was thinner and more scarred compared with opposite breast. Limited division of inferior insertions of muscle completed and submuscular dissection completed. Dual plane dissection completed and breast tissue was elevated off anterior surface of pectoralis muscle to level of nipple. The prior lumpectomy cavity was not encountered during dissection. A moderate plus profile was placed in cavity and incision tailor tacked closed.   Incision then made through prior right inframammary fold scar on left. Dissection of calcified implant capsule to free from glandular tissue anteriorly and pectoralis muscle below. The implant was noted to have gross intracapsular rupture. The implant, implant material, and capsule removed and by volume displacement was noted to be in the 250-300 ml range. The subglandular pocket was then irrigated with saline. The subglandular pocket was then closed with interrupted 2-0 PDS sutures from the glandular tissue to the pectoralis muscle. Limited division of inferior insertions of the pectoralis muscle completed with cautery and submuscular dissection completed.  The patient was brought to sitting position and various sizers tried. After final selection of implants, mastopexy was marked on left breast by tailor tacking. The patient was returned to supine position and both breast cavities irrigated with solution containing Ancef, gentamicin, and bacitracin. Hemostasis ensured and 15 Fr JP placed in each breast cavity and secured with 2-0 nylon.   Over right breast, Mentor Moderate Plus profile implant placed and layered closure completed with 3-0 vicrul to approximate superficial fascia, followed by 4-0 vicryl in dermis and 4-0 monocryl subcuticular for skin closure. Over left breast, the areas  marked for excision by tailor tacking  were excised or deepithelialized. Following placement of Moderate Classic profile implant, the lateral limbs of mastopexy were approximated with 3-0 vicryl in superficial fascia. 3-0 vicryl also used to approximate superficial fascia along inframammary fold and in dermis. 4-0 vicryl used to inset nipple areolar complex and in dermis along vertical scar. 4-0 monocryl subcuticular used throughout for skin closure. Tissue adhesive applied followed by dry dressing and breast binder.  The patient was allowed to wake from anesthesia, extubated and taken to the recovery room in satisfactory condition.   SPECIMENS: 1. Left breast capsule 2. Left breast mastopexy  DRAINS: 15 Fr JP in right and left breast  Irene Limbo, MD [facility] [phone]

## 2014-06-25 NOTE — Anesthesia Postprocedure Evaluation (Signed)
  Anesthesia Post-op Note  Patient: [name]  Procedure(s) Performed: Procedure(s): REMOVAL LEFT BREAST IMPLANT AND MATERIAL,  (Left) LEFT BREAST AUGMENTATION WITH SILICONE,  RIGHT BREAST AUGMENTATION (Bilateral) LEFT BREAST MASTOPEXY  (Bilateral)  Patient Location: PACU  Anesthesia Type: General   Level of Consciousness: awake, alert  and oriented  Airway and Oxygen Therapy: Patient Spontanous Breathing  Post-op Pain: mild  Post-op Assessment: Post-op Vital signs reviewed  Post-op Vital Signs: Reviewed  Last Vitals:  Filed Vitals:   06/25/14 1510  BP:   Pulse: 102  Temp:   Resp: 20    Complications: No apparent anesthesia complications

## 2014-06-26 ENCOUNTER — Encounter (HOSPITAL_BASED_OUTPATIENT_CLINIC_OR_DEPARTMENT_OTHER): Payer: Self-pay | Admitting: Plastic Surgery

## 2014-06-26 DIAGNOSIS — T8589XA Other specified complication of internal prosthetic devices, implants and grafts, not elsewhere classified, initial encounter: Secondary | ICD-10-CM | POA: Diagnosis not present

## 2014-06-26 NOTE — Discharge Instructions (Addendum)

## 2014-09-20 HISTORY — PX: VENA CAVA FILTER PLACEMENT: SUR1032

## 2014-09-27 ENCOUNTER — Encounter (HOSPITAL_COMMUNITY): Payer: Self-pay | Admitting: *Deleted

## 2014-09-27 ENCOUNTER — Inpatient Hospital Stay (HOSPITAL_COMMUNITY)
Admission: EM | Admit: 2014-09-27 | Discharge: 2014-10-03 | DRG: 167 | Disposition: A | Discharge status: Home / Self Care | Payer: Medicare Other | Attending: Pulmonary Disease | Admitting: Pulmonary Disease

## 2014-09-27 ENCOUNTER — Emergency Department (HOSPITAL_COMMUNITY): Payer: Medicare Other

## 2014-09-27 DIAGNOSIS — W1811XA Fall from or off toilet without subsequent striking against object, initial encounter: Secondary | ICD-10-CM | POA: Diagnosis present

## 2014-09-27 DIAGNOSIS — E559 Vitamin D deficiency, unspecified: Secondary | ICD-10-CM | POA: Diagnosis present

## 2014-09-27 DIAGNOSIS — Z79810 Long term (current) use of selective estrogen receptor modulators (SERMs): Secondary | ICD-10-CM | POA: Diagnosis not present

## 2014-09-27 DIAGNOSIS — D509 Iron deficiency anemia, unspecified: Secondary | ICD-10-CM | POA: Diagnosis present

## 2014-09-27 DIAGNOSIS — I2699 Other pulmonary embolism without acute cor pulmonale: Principal | ICD-10-CM | POA: Diagnosis present

## 2014-09-27 DIAGNOSIS — C50919 Malignant neoplasm of unspecified site of unspecified female breast: Secondary | ICD-10-CM | POA: Diagnosis present

## 2014-09-27 DIAGNOSIS — Z9882 Breast implant status: Secondary | ICD-10-CM | POA: Diagnosis not present

## 2014-09-27 DIAGNOSIS — S0093XA Contusion of unspecified part of head, initial encounter: Secondary | ICD-10-CM | POA: Diagnosis present

## 2014-09-27 DIAGNOSIS — D638 Anemia in other chronic diseases classified elsewhere: Secondary | ICD-10-CM | POA: Diagnosis present

## 2014-09-27 DIAGNOSIS — Z823 Family history of stroke: Secondary | ICD-10-CM | POA: Diagnosis not present

## 2014-09-27 DIAGNOSIS — Z86711 Personal history of pulmonary embolism: Secondary | ICD-10-CM

## 2014-09-27 DIAGNOSIS — I82491 Acute embolism and thrombosis of other specified deep vein of right lower extremity: Secondary | ICD-10-CM | POA: Diagnosis present

## 2014-09-27 DIAGNOSIS — R0602 Shortness of breath: Secondary | ICD-10-CM | POA: Insufficient documentation

## 2014-09-27 DIAGNOSIS — I82401 Acute embolism and thrombosis of unspecified deep veins of right lower extremity: Secondary | ICD-10-CM | POA: Diagnosis not present

## 2014-09-27 DIAGNOSIS — I82403 Acute embolism and thrombosis of unspecified deep veins of lower extremity, bilateral: Secondary | ICD-10-CM | POA: Diagnosis not present

## 2014-09-27 DIAGNOSIS — Z825 Family history of asthma and other chronic lower respiratory diseases: Secondary | ICD-10-CM

## 2014-09-27 DIAGNOSIS — K219 Gastro-esophageal reflux disease without esophagitis: Secondary | ICD-10-CM | POA: Diagnosis present

## 2014-09-27 DIAGNOSIS — R55 Syncope and collapse: Secondary | ICD-10-CM | POA: Diagnosis present

## 2014-09-27 DIAGNOSIS — Z923 Personal history of irradiation: Secondary | ICD-10-CM | POA: Diagnosis not present

## 2014-09-27 DIAGNOSIS — E780 Pure hypercholesterolemia: Secondary | ICD-10-CM | POA: Diagnosis present

## 2014-09-27 DIAGNOSIS — R05 Cough: Secondary | ICD-10-CM | POA: Diagnosis present

## 2014-09-27 DIAGNOSIS — I82441 Acute embolism and thrombosis of right tibial vein: Secondary | ICD-10-CM | POA: Diagnosis present

## 2014-09-27 DIAGNOSIS — D649 Anemia, unspecified: Secondary | ICD-10-CM

## 2014-09-27 DIAGNOSIS — I82431 Acute embolism and thrombosis of right popliteal vein: Secondary | ICD-10-CM | POA: Diagnosis present

## 2014-09-27 DIAGNOSIS — M7981 Nontraumatic hematoma of soft tissue: Secondary | ICD-10-CM | POA: Diagnosis not present

## 2014-09-27 DIAGNOSIS — T45615A Adverse effect of thrombolytic drugs, initial encounter: Secondary | ICD-10-CM | POA: Diagnosis not present

## 2014-09-27 DIAGNOSIS — I10 Essential (primary) hypertension: Secondary | ICD-10-CM | POA: Diagnosis present

## 2014-09-27 DIAGNOSIS — I82409 Acute embolism and thrombosis of unspecified deep veins of unspecified lower extremity: Secondary | ICD-10-CM | POA: Diagnosis not present

## 2014-09-27 HISTORY — DX: Personal history of pulmonary embolism: Z86.711

## 2014-09-27 LAB — D-DIMER, QUANTITATIVE (NOT AT ARMC): D DIMER QUANT: 3.59 ug{FEU}/mL — AB (ref 0.00–0.48)

## 2014-09-27 LAB — COMPREHENSIVE METABOLIC PANEL
ALBUMIN: 3 g/dL — AB (ref 3.5–5.2)
ALK PHOS: 61 U/L (ref 39–117)
ALT: 23 U/L (ref 0–35)
ANION GAP: 10 (ref 5–15)
AST: 34 U/L (ref 0–37)
BILIRUBIN TOTAL: 0.5 mg/dL (ref 0.3–1.2)
BUN: 16 mg/dL (ref 6–23)
CHLORIDE: 107 mmol/L (ref 96–112)
CO2: 23 mmol/L (ref 19–32)
Calcium: 8.5 mg/dL (ref 8.4–10.5)
Creatinine, Ser: 1.06 mg/dL (ref 0.50–1.10)
GFR calc Af Amer: 59 mL/min — ABNORMAL LOW (ref >90)
GFR calc non Af Amer: 51 mL/min — ABNORMAL LOW (ref >90)
Glucose, Bld: 153 mg/dL — ABNORMAL HIGH (ref 70–99)
POTASSIUM: 4 mmol/L (ref 3.5–5.1)
Sodium: 140 mmol/L (ref 135–145)
TOTAL PROTEIN: 5.6 g/dL — AB (ref 6.0–8.3)

## 2014-09-27 LAB — CBC WITH DIFFERENTIAL/PLATELET
BASOS PCT: 0 % (ref 0–1)
Basophils Absolute: 0 10*3/uL (ref 0.0–0.1)
Eosinophils Absolute: 0.1 10*3/uL (ref 0.0–0.7)
Eosinophils Relative: 1 % (ref 0–5)
HEMATOCRIT: 29.6 % — AB (ref 36.0–46.0)
HEMOGLOBIN: 8.9 g/dL — AB (ref 12.0–15.0)
LYMPHS PCT: 9 % — AB (ref 12–46)
Lymphs Abs: 0.9 10*3/uL (ref 0.7–4.0)
MCH: 23.5 pg — ABNORMAL LOW (ref 26.0–34.0)
MCHC: 30.1 g/dL (ref 30.0–36.0)
MCV: 78.3 fL (ref 78.0–100.0)
MONO ABS: 0.6 10*3/uL (ref 0.1–1.0)
Monocytes Relative: 6 % (ref 3–12)
NEUTROS ABS: 9 10*3/uL — AB (ref 1.7–7.7)
NEUTROS PCT: 84 % — AB (ref 43–77)
Platelets: 243 10*3/uL (ref 150–400)
RBC: 3.78 MIL/uL — ABNORMAL LOW (ref 3.87–5.11)
RDW: 15.7 % — ABNORMAL HIGH (ref 11.5–15.5)
WBC: 10.6 10*3/uL — AB (ref 4.0–10.5)

## 2014-09-27 LAB — GLUCOSE, CAPILLARY: Glucose-Capillary: 136 mg/dL — ABNORMAL HIGH (ref 70–99)

## 2014-09-27 LAB — APTT: APTT: 28 s (ref 24–37)

## 2014-09-27 LAB — BRAIN NATRIURETIC PEPTIDE: B NATRIURETIC PEPTIDE 5: 379.7 pg/mL — AB (ref 0.0–100.0)

## 2014-09-27 LAB — HEPARIN LEVEL (UNFRACTIONATED): Heparin Unfractionated: 0.66 IU/mL (ref 0.30–0.70)

## 2014-09-27 LAB — MRSA PCR SCREENING: MRSA BY PCR: NEGATIVE

## 2014-09-27 LAB — I-STAT TROPONIN, ED: TROPONIN I, POC: 0.08 ng/mL (ref 0.00–0.08)

## 2014-09-27 LAB — PROTIME-INR
INR: 1.08 (ref 0.00–1.49)
PROTHROMBIN TIME: 14.1 s (ref 11.6–15.2)

## 2014-09-27 MED ORDER — HEPARIN BOLUS VIA INFUSION
4000.0000 [IU] | Freq: Once | INTRAVENOUS | Status: AC
  Administered 2014-09-27: 4000 [IU] via INTRAVENOUS
  Filled 2014-09-27: qty 4000

## 2014-09-27 MED ORDER — SODIUM CHLORIDE 0.9 % IV SOLN
INTRAVENOUS | Status: DC
  Administered 2014-09-27 – 2014-09-28 (×2): via INTRAVENOUS

## 2014-09-27 MED ORDER — PANTOPRAZOLE SODIUM 40 MG IV SOLR
40.0000 mg | INTRAVENOUS | Status: DC
  Administered 2014-09-27 – 2014-09-29 (×3): 40 mg via INTRAVENOUS
  Filled 2014-09-27 (×4): qty 40

## 2014-09-27 MED ORDER — ASPIRIN 81 MG PO CHEW
324.0000 mg | CHEWABLE_TABLET | ORAL | Status: AC
  Administered 2014-09-27: 324 mg via ORAL
  Filled 2014-09-27: qty 4

## 2014-09-27 MED ORDER — HEPARIN (PORCINE) IN NACL 100-0.45 UNIT/ML-% IJ SOLN
1100.0000 [IU]/h | INTRAMUSCULAR | Status: AC
  Administered 2014-09-27 – 2014-09-29 (×3): 1100 [IU]/h via INTRAVENOUS
  Filled 2014-09-27 (×4): qty 250

## 2014-09-27 MED ORDER — ASPIRIN 300 MG RE SUPP: 300.0000 mg | RECTAL | Status: AC

## 2014-09-27 MED ORDER — IOHEXOL 350 MG/ML SOLN
80.0000 mL | Freq: Once | INTRAVENOUS | Status: AC | PRN
  Administered 2014-09-27: 80 mL via INTRAVENOUS

## 2014-09-27 MED ORDER — FENTANYL CITRATE 0.05 MG/ML IJ SOLN: 25.0000 ug | INTRAMUSCULAR | Status: DC | PRN

## 2014-09-27 NOTE — ED Notes (Signed)
Per [facility] pt was on the toilet and passed out. Small hematoma to head. [facility] reported that pt was orthostatic and there was a loss of radial pulse while standing.  VS are as follows: BP:110/74 HR:120 Resp:18 O2sat 100% on 4L

## 2014-09-27 NOTE — ED Notes (Signed)
Pt leaving for CT.  
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
## 2014-09-27 NOTE — Progress Notes (Signed)
ANTICOAGULATION CONSULT NOTE - Initial Consult  Pharmacy Consult for heparin  Indication: pulmonary embolus  No Known Allergies  Patient Measurements: Height: 5' 5.5" (166.4 cm) Weight: 194 lb (87.998 kg) IBW/kg (Calculated) : 58.15 Heparin Dosing Weight: 70 kg  Vital Signs: Temp: 97.9 F (36.6 C) (04/08 1008) Temp Source: Oral (04/08 1008) BP: 108/44 mmHg (04/08 1304) Pulse Rate: 121 (04/08 1304)  Labs:  Recent Labs  09/27/14 1046  HGB 8.9*  HCT 29.6*  PLT 243  CREATININE 1.06    Estimated Creatinine Clearance: 53.1 mL/min (by C-G formula based on Cr of 1.06).   Medical History: Past Medical History  Diagnosis Date  . Essential hypertension   . Hypercholesterolemia   . Vitamin D deficiency   . Fibromyalgia   . Anemia   . Depression     related to fibromyalgia  . Complication of anesthesia 2009    nausea  . GERD (gastroesophageal reflux disease)   . Diverticulosis   . Hiatal hernia   . 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 Iron deficiency anemia   . S/P radiation therapy 09/24/2013-10/17/2013    Right breast / 42.56 Gy in 16 fractions  . Use of tamoxifen (Nolvadex)     ????  . PONV (postoperative nausea and vomiting)     put scope patch on-still got sick last surgery  . Wears contact lenses     Medications:  No anticoagulants  Assessment: 74 yo F in ED.  Pt was on toilet and passed out, small hematoma to head.  Pt was orthostatic and had loss of consciousness.  PMH iron def anemia, breast reconstruction surgery in January.  Hg 8.9 from 12 in January.  Per pt Hg was 9 at MD office 3 weeks ago.  Wt  88 kg, creat OK.  4/8 CT: submassive PE with R heart strain.    Goal of Therapy:  Heparin level 0.3-0.7 units/ml Monitor platelets by anticoagulation protocol: Yes   Plan:  Give 4000 units bolus x 1 Start heparin infusion at 1100 units/hr Check anti-Xa level in 8 hours  and daily while on heparin Continue to monitor H&H and platelets   Eudelia Bunch, Pharm.D. [phone] 09/27/2014 2:00 PM

## 2014-09-27 NOTE — ED Notes (Signed)
Attempted report X1

## 2014-09-27 NOTE — Progress Notes (Signed)
  Echocardiogram 2D Echocardiogram has been performed.  [name] 09/27/2014, 7:20 PM
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
## 2014-09-27 NOTE — Consult Note (Signed)
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 She is s/p radiation therapy in Dec 2014. Has long standing h/o anemia. GI endoscopic eval has been negative. She is s/p breast reconstruction surgery in Jan 2016 for ruptured left breast implant. She was discharged to home on 1/7 w/ some limitation in mobility. Starting about 8 weeks prior to admit she started to note right groin and leg pain. Admitted on 4/8 w/ acute bilateral Pulmonary emboli and w/ evidence of right heart strain   SIGNIFICANT EVENTS    STUDIES:  CT chest 4/8:Moderate burden of bilateral pulmonary emboli. Positive for acute PE with CT evidence of right heart strain (RV/LV Ratio = 1.9).   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 She is s/p radiation therapy in Dec 2014. Has long standing h/o anemia. GI endoscopic eval has been negative. She is s/p breast reconstruction surgery in Jan 2016 for ruptured left breast implant. She was discharged to home on 1/7 w/ some limitation in mobility. Starting about 8 weeks prior to admit she started to note right groin and leg pain. She spoke to her primary about this and they felt that she needed Physical therapy. The pain was persistent and about 4 weeks ago had some transient swelling. During this time she began to note increased shortness of breath and decreased activity tolerance. About three weeks ago this was felt to be bronchitis and she was advised to treat with OTCs. Over the 2 d prior to admit she had recurrent episodes of light-headedness after standing. On afternoon of 4/8 she had a syncopal episode when going to the bathroom. Dx eval in ER demonstrated bilateral PE  w/ evidence of right heart strain. PCCM was asked to admit   PAST MEDICAL HISTORY :   has a past medical history of Essential hypertension; Hypercholesterolemia; Vitamin D deficiency; Fibromyalgia; Anemia; Depression; Complication of anesthesia (2009); GERD (gastroesophageal reflux disease); Diverticulosis; Hiatal hernia; Breast cancer; Iron deficiency anemia; S/P radiation therapy (09/24/2013-10/17/2013); Use of tamoxifen (Nolvadex); PONV (postoperative nausea and vomiting); and Wears contact lenses.  has past surgical history that includes Appendectomy; Breast enhancement surgery; Tubal ligation; Liposuction (1995); Facial cosmetic surgery (2009); Esophagogastroduodenoscopy (N/A, 07/05/2013); Colonoscopy (N/A, 07/05/2013); Tonsillectomy; Reconstruction of nose; Breast lumpectomy with needle localization and axillary sentinel lymph node bx (Right, 07/31/2013); Skin biopsy (Right, 07/31/2013); Breast implant removal (Right, 07/31/2013); Mass excision (N/A, 10/12/2013); Breast implant removal (Left, 06/25/2014); Breast enhancement surgery (Bilateral, 06/25/2014); and Mastopexy (Bilateral, 06/25/2014). Prior to Admission medications   Medication Sig Start Date End Date Taking? Authorizing Provider  Ascorbic Acid (VITAMIN C) 100 MG tablet Take 100 mg by mouth daily.     Historical Provider, MD  BIOTIN PO Take 1 tablet by mouth every morning.    Historical Provider, MD  CALCIUM PO Take 1 capsule by mouth 2 (two) times daily. 1500mg  twice a day, chews.    Historical Provider, MD  cholecalciferol (VITAMIN D) 1000 UNITS tablet Take 1,000 Units by mouth 2 (two) times daily. D#3 type    Historical Provider, MD  iron polysaccharides (NIFEREX) 150 MG capsule Take 150 mg by mouth daily. Pt takes medication once a  week.    Historical Provider, MD  Javier Docker Oil 1000 MG CAPS Take 1 capsule by mouth daily.    Historical Provider, MD  lisinopril (PRINIVIL,ZESTRIL) 20 MG tablet Take 20 mg by mouth daily.    Historical Provider, MD    Multiple Vitamin (MULTIVITAMIN) tablet Take 1 tablet by mouth 2 (two) times daily. gummies    Historical Provider, MD  non-metallic deodorant Jethro Poling) MISC Apply 1 application topically daily as needed.    Historical Provider, MD  Omega-3 Fatty Acids (FISH OIL) 1000 MG CAPS Take 1 capsule by mouth 2 (two) times daily.    Historical Provider, MD  Probiotic Product ([place]) Take 1 capsule by mouth daily.    Historical Provider, MD  ranitidine (ZANTAC) 150 MG tablet Take 1 tablet (150 mg total) by mouth at bedtime. 07/05/13   Lafayette Dragon, MD  tamoxifen (NOLVADEX) 20 MG tablet Take 1 tablet (20 mg total) by mouth daily. 03/14/14   Minette Headland, NP  vitamin E 100 UNIT capsule Take 100 Units by mouth daily.    Historical Provider, MD   No Known Allergies  FAMILY HISTORY:  family history includes COPD in her mother; Stroke in her maternal grandmother; Tuberculosis in her father. There is no history of Colon cancer. SOCIAL HISTORY:  reports that she has never smoked. She has never used smokeless tobacco. She reports that she does not drink alcohol or use illicit drugs.  REVIEW OF SYSTEMS:   Constitutional: Negative for fever, chills, weight loss, malaise/fatigue and diaphoresis.  HENT: Negative for hearing loss, ear pain, nosebleeds, congestion, sore throat, neck pain, tinnitus and ear discharge.   Eyes: Negative for blurred vision, double vision, photophobia, pain, discharge and redness.  Respiratory: Negative for cough, hemoptysis, sputum production, shortness of breath, wheezing and stridor.   Cardiovascular: Negative for chest pain, palpitations, orthopnea, claudication, leg swelling and PND. +orthostasis  Gastrointestinal: Negative for heartburn, nausea, vomiting, abdominal pain, diarrhea, constipation, blood in stool and melena.  Genitourinary: Negative for dysuria, urgency, frequency, hematuria and flank pain.  Musculoskeletal: Negative for myalgias, back pain, joint  pain and falls.  Skin: Negative for itching and rash.  Neurological: Negative for dizziness, tingling, tremors, sensory change, speech change, focal weakness, seizures, loss of consciousness, weakness and headaches.  Endo/Heme/Allergies: Negative for environmental allergies and polydipsia. Does not bruise/bleed easily.  SUBJECTIVE:  Sob w/ any exertion  VITAL SIGNS: Temp:  [97.9 F (36.6 C)] 97.9 F (36.6 C) (04/08 1008) Pulse Rate:  [117-125] 125 (04/08 1400) Resp:  [19-21] 21 (04/08 1304) BP: (107-124)/(44-78) 108/68 mmHg (04/08 1400) SpO2:  [98 %-100 %] 99 % (04/08 1400) Weight:  [87.998 kg (194 lb)] 87.998 kg (194 lb) (04/08 1008) 4 liters  PHYSICAL EXAMINATION: General:  Elderly 74 year old female lying comfortably in bed Neuro:  Awake, alert, no focal def  HEENT:  [place], no JVD Cardiovascular:  rrr (tachy) Lungs:  Clear decreased in bases  Abdomen:  Soft, non-tender  Musculoskeletal:  Intact  Skin:  LE trace R>L edema   Recent Labs Lab 09/27/14 1046  NA 140  K 4.0  CL 107  CO2 23  BUN 16  CREATININE 1.06  GLUCOSE 153*    Recent Labs Lab 09/27/14 1046  HGB 8.9*  HCT 29.6*  WBC 10.6*  PLT 243   Dg Chest 2 View  09/27/2014   CLINICAL DATA:  Shortness of breath for a few days.  EXAM: CHEST  2 VIEW  COMPARISON:  PA and  lateral chest 07/26/2013.  FINDINGS: The lungs are clear. Heart size is normal. No pneumothorax or pleural effusion. Small hiatal hernia is noted. Calcified breast implants seen on the prior study are no longer visualized.  IMPRESSION: No acute disease.  Small hiatal hernia.   Electronically Signed   By: Inge Rise M.D.   On: 09/27/2014 11:10   Ct Angio Chest Pe W/cm &/or Wo Cm  09/27/2014   CLINICAL DATA:  73YOF today pt was on the toilet and passed out. Small hematoma to head. [facility] reported that pt was orthostatic and there was a loss of radial pulse while standing. History of right breast cancer.  EXAM: CT ANGIOGRAPHY CHEST WITH CONTRAST   TECHNIQUE: Multidetector CT imaging of the chest was performed using the standard protocol during bolus administration of intravenous contrast. Multiplanar CT image reconstructions and MIPs were obtained to evaluate the vascular anatomy.  CONTRAST:  72mL OMNIPAQUE IOHEXOL 350 MG/ML SOLN  COMPARISON:  CT abdomen 08/09/2013 and chest x-ray today.  FINDINGS: Lungs are clear.  Airways are normal.  Heart is normal in size. Pulmonary arterial system is well opacified and demonstrates moderate burden of emboli over the origin of the upper and lower lobar pulmonary arteries bilaterally as well as the proximal lingular and right middle lobe pulmonary arteries. There is evidence of right heart strain with elevated RV/LV ratio of 50.5 mm/27.2 mm = 1.9 (normal less than 0.9).  There is a moderate size hiatal hernia. Remaining mediastinal structures are unremarkable. Bilateral breast implants present.  Images through the upper abdomen demonstrate a 3 mm calcification over the upper pole left kidney. Mild degenerative change of the spine. No acute fractures.  Review of the MIP images confirms the above findings.  IMPRESSION: Moderate burden of bilateral pulmonary emboli. Positive for acute PE with CT evidence of right heart strain (RV/LV Ratio = 1.9) consistent with at least submassive (intermediate risk) PE. The presence of right heart strain has been associated with an increased risk of morbidity and mortality. Please activate Code PE by paging [phone].  Moderate size hiatal hernia.  3 mm calcification over the upper pole left renal cortex.  Critical Value/emergent results were called by telephone at the time of interpretation on 09/27/2014 at 1:33 pm to Dr. Blanchie Dessert , who verbally acknowledged these results.   Electronically Signed   By: Marin Olp M.D.   On: 09/27/2014 13:34    ASSESSMENT / PLAN: Acute Submassive  Bilateral Pulmonary Emboli w/ evidence of right heart strain Probable LLE DVT Tachycardia   Remote h/o right breast cancer (stage I invasive lobe carcinoma s/p xrt 2014) H/o chronic anemia (gi work up negative) GERD  Discussion  Suspect provoked acute DVT and subsequent PE due to immobility after recent breast reconstructive surgery. CT findings of RV/RL ration of 1.9; tachycardia and syncopal episode all support that this has been a submassive PE w/ evidence of right heart strain. Think that she would be a good candidate for catheter directed TPA  Plan Start IV heparin IR has been consulted for possible catheter directed TPA Ck ECHO  CK LE dopplers PPI for SUP Will decide of NOAC vs coumadin after completion of cath directed TPA F/u chemistry and ck serial coags Will hold off on hypercoagulable panel given high likelihood this is provoked and also given mobility and h/o cancer may benefit from life long rx anyway  Erick Colace ACNP-BC [place] Pager # [phone] OR # [phone] if no answer  09/27/2014, 2:34  PM  Reviewed above, examined.  74 yo female presented with intermittent leg swelling and progressive dyspnea.  She was also having light-headedness, and had syncopal event today while going to the bathroom.  She was found to be tachycardic and elevated D dimer.  She had CT angiogram chest which showed extensive PE with RV:LV ratio of 1.9.  She still feels dizzy and has chest discomfort.  She does have some dyspnea.  She denies abdominal pain.  There is no hx of melena or blood in stool.  She denies hx of head injury, stroke, or brain surgery.  There is no hx of thrombo-embolic disease.  She does have hx of breast cancer and has been on tamoxifen.  She also had breast reconstruction surgery in January 2016 and has been relatively inactive since then.  She is alert, heart rate regular and tachycardic, no wheezing, abd soft, no edema.  Discussed pros/cons about catheter direct thrombolytic therapy.  Given her symptoms, tachycardic, clot burden, and  elevated RV:LV ratio I think the benefit for EKOS outweigh the risk.  I do not see any specific contra-indications for EKOS.  She is agreeable to d/w IR further.  Will continue with heparin gtt for now.  Will need to discuss options for long term anti-coagulation after she has completed EKOS >> will likely benefit from having her oncologist (Dr. Lindi Adie) participate in that conversation and also discuss plans for tamoxifen therapy.  Updated pt's family at bedside.  CC time by me independent of APP time is 35 minutes.  Chesley Mires, MD [facility] Pulmonary/Critical Care 09/27/2014, 4:11 PM Pager:  [phone] After 3pm call: [phone]

## 2014-09-27 NOTE — H&P (Signed)
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 She is s/p radiation therapy in Dec 2014. Has long standing h/o anemia. GI endoscopic eval has been negative. She is s/p breast reconstruction surgery in Jan 2016 for ruptured left breast implant. She was discharged to home on 1/7 w/ some limitation in mobility. Starting about 8 weeks prior to admit she started to note right groin and leg pain. Admitted on 4/8 w/ acute bilateral Pulmonary emboli and w/ evidence of right heart strain   SIGNIFICANT EVENTS    STUDIES:  CT chest 4/8:Moderate burden of bilateral pulmonary emboli. Positive for acute PE with CT evidence of right heart strain (RV/LV Ratio = 1.9).   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 She is s/p radiation therapy in Dec 2014. Has long standing h/o anemia. GI endoscopic eval has been negative. She is s/p breast reconstruction surgery in Jan 2016 for ruptured left breast implant. She was discharged to home on 1/7 w/ some limitation in mobility. Starting about 8 weeks prior to admit she started to note right groin and leg pain. She spoke to her primary about this and they felt that she needed Physical therapy. The pain was persistent and about 4 weeks ago had some transient swelling. During this time she began to note increased shortness of breath and decreased activity tolerance. About three weeks ago this was felt to be bronchitis and she was advised to treat with OTCs. Over the 2 d prior to admit she had recurrent episodes of light-headedness after standing. On afternoon of 4/8 she had a syncopal episode when going to the bathroom. Dx eval in ER demonstrated bilateral PE  w/ evidence of right heart strain. PCCM was asked to admit   PAST MEDICAL HISTORY :   has a past medical history of Essential hypertension; Hypercholesterolemia; Vitamin D deficiency; Fibromyalgia; Anemia; Depression; Complication of anesthesia (2009); GERD (gastroesophageal reflux disease); Diverticulosis; Hiatal hernia; Breast cancer; Iron deficiency anemia; S/P radiation therapy (09/24/2013-10/17/2013); Use of tamoxifen (Nolvadex); PONV (postoperative nausea and vomiting); and Wears contact lenses.  has past surgical history that includes Appendectomy; Breast enhancement surgery; Tubal ligation; Liposuction (1995); Facial cosmetic surgery (2009); Esophagogastroduodenoscopy (N/A, 07/05/2013); Colonoscopy (N/A, 07/05/2013); Tonsillectomy; Reconstruction of nose; Breast lumpectomy with needle localization and axillary sentinel lymph node bx (Right, 07/31/2013); Skin biopsy (Right, 07/31/2013); Breast implant removal (Right, 07/31/2013); Mass excision (N/A, 10/12/2013); Breast implant removal (Left, 06/25/2014); Breast enhancement surgery (Bilateral, 06/25/2014); and Mastopexy (Bilateral, 06/25/2014). Prior to Admission medications   Medication Sig Start Date End Date Taking? Authorizing Provider  Ascorbic Acid (VITAMIN C) 100 MG tablet Take 100 mg by mouth daily.     Historical Provider, MD  BIOTIN PO Take 1 tablet by mouth every morning.    Historical Provider, MD  CALCIUM PO Take 1 capsule by mouth 2 (two) times daily. 1500mg  twice a day, chews.    Historical Provider, MD  cholecalciferol (VITAMIN D) 1000 UNITS tablet Take 1,000 Units by mouth 2 (two) times daily. D#3 type    Historical Provider, MD  iron polysaccharides (NIFEREX) 150 MG capsule Take 150 mg by mouth daily. Pt takes medication once a  week.    Historical Provider, MD  Javier Docker Oil 1000 MG CAPS Take 1 capsule by mouth daily.    Historical Provider, MD  lisinopril (PRINIVIL,ZESTRIL) 20 MG tablet Take 20 mg by mouth daily.    Historical Provider, MD    Multiple Vitamin (MULTIVITAMIN) tablet Take 1 tablet by mouth 2 (two) times daily. gummies    Historical Provider, MD  non-metallic deodorant Jethro Poling) MISC Apply 1 application topically daily as needed.    Historical Provider, MD  Omega-3 Fatty Acids (FISH OIL) 1000 MG CAPS Take 1 capsule by mouth 2 (two) times daily.    Historical Provider, MD  Probiotic Product ([place]) Take 1 capsule by mouth daily.    Historical Provider, MD  ranitidine (ZANTAC) 150 MG tablet Take 1 tablet (150 mg total) by mouth at bedtime. 07/05/13   Lafayette Dragon, MD  tamoxifen (NOLVADEX) 20 MG tablet Take 1 tablet (20 mg total) by mouth daily. 03/14/14   Minette Headland, NP  vitamin E 100 UNIT capsule Take 100 Units by mouth daily.    Historical Provider, MD   No Known Allergies  FAMILY HISTORY:  family history includes COPD in her mother; Stroke in her maternal grandmother; Tuberculosis in her father. There is no history of Colon cancer. SOCIAL HISTORY:  reports that she has never smoked. She has never used smokeless tobacco. She reports that she does not drink alcohol or use illicit drugs.  REVIEW OF SYSTEMS:   Constitutional: Negative for fever, chills, weight loss, malaise/fatigue and diaphoresis.  HENT: Negative for hearing loss, ear pain, nosebleeds, congestion, sore throat, neck pain, tinnitus and ear discharge.   Eyes: Negative for blurred vision, double vision, photophobia, pain, discharge and redness.  Respiratory: Negative for cough, hemoptysis, sputum production, shortness of breath, wheezing and stridor.   Cardiovascular: Negative for chest pain, palpitations, orthopnea, claudication, leg swelling and PND. +orthostasis  Gastrointestinal: Negative for heartburn, nausea, vomiting, abdominal pain, diarrhea, constipation, blood in stool and melena.  Genitourinary: Negative for dysuria, urgency, frequency, hematuria and flank pain.  Musculoskeletal: Negative for myalgias, back pain, joint  pain and falls.  Skin: Negative for itching and rash.  Neurological: Negative for dizziness, tingling, tremors, sensory change, speech change, focal weakness, seizures, loss of consciousness, weakness and headaches.  Endo/Heme/Allergies: Negative for environmental allergies and polydipsia. Does not bruise/bleed easily.  SUBJECTIVE:  Sob w/ any exertion  VITAL SIGNS: Temp:  [97.9 F (36.6 C)] 97.9 F (36.6 C) (04/08 1008) Pulse Rate:  [117-125] 117 (04/08 1545) Resp:  [18-21] 18 (04/08 1434) BP: (102-124)/(40-78) 115/69 mmHg (04/08 1545) SpO2:  [98 %-100 %] 99 % (04/08 1545) Weight:  [194 lb (87.998 kg)] 194 lb (87.998 kg) (04/08 1008) 4 liters  PHYSICAL EXAMINATION: General:  Elderly 74 year old female lying comfortably in bed Neuro:  Awake, alert, no focal def  HEENT:  [place], no JVD Cardiovascular:  rrr (tachy) Lungs:  Clear decreased in bases  Abdomen:  Soft, non-tender  Musculoskeletal:  Intact  Skin:  LE trace R>L edema   Recent Labs Lab 09/27/14 1046  NA 140  K 4.0  CL 107  CO2 23  BUN 16  CREATININE 1.06  GLUCOSE 153*    Recent Labs Lab 09/27/14 1046  HGB 8.9*  HCT 29.6*  WBC 10.6*  PLT 243   Dg Chest 2 View  09/27/2014   CLINICAL DATA:  Shortness of breath for a few days.  EXAM: CHEST  2 VIEW  COMPARISON:  PA and  lateral chest 07/26/2013.  FINDINGS: The lungs are clear. Heart size is normal. No pneumothorax or pleural effusion. Small hiatal hernia is noted. Calcified breast implants seen on the prior study are no longer visualized.  IMPRESSION: No acute disease.  Small hiatal hernia.   Electronically Signed   By: Inge Rise M.D.   On: 09/27/2014 11:10   Ct Angio Chest Pe W/cm &/or Wo Cm  09/27/2014   CLINICAL DATA:  73YOF today pt was on the toilet and passed out. Small hematoma to head. GEMS reported that pt was orthostatic and there was a loss of radial pulse while standing. History of right breast cancer.  EXAM: CT ANGIOGRAPHY CHEST WITH CONTRAST   TECHNIQUE: Multidetector CT imaging of the chest was performed using the standard protocol during bolus administration of intravenous contrast. Multiplanar CT image reconstructions and MIPs were obtained to evaluate the vascular anatomy.  CONTRAST:  57mL OMNIPAQUE IOHEXOL 350 MG/ML SOLN  COMPARISON:  CT abdomen 08/09/2013 and chest x-ray today.  FINDINGS: Lungs are clear.  Airways are normal.  Heart is normal in size. Pulmonary arterial system is well opacified and demonstrates moderate burden of emboli over the origin of the upper and lower lobar pulmonary arteries bilaterally as well as the proximal lingular and right middle lobe pulmonary arteries. There is evidence of right heart strain with elevated RV/LV ratio of 50.5 mm/27.2 mm = 1.9 (normal less than 0.9).  There is a moderate size hiatal hernia. Remaining mediastinal structures are unremarkable. Bilateral breast implants present.  Images through the upper abdomen demonstrate a 3 mm calcification over the upper pole left kidney. Mild degenerative change of the spine. No acute fractures.  Review of the MIP images confirms the above findings.  IMPRESSION: Moderate burden of bilateral pulmonary emboli. Positive for acute PE with CT evidence of right heart strain (RV/LV Ratio = 1.9) consistent with at least submassive (intermediate risk) PE. The presence of right heart strain has been associated with an increased risk of morbidity and mortality. Please activate Code PE by paging [phone].  Moderate size hiatal hernia.  3 mm calcification over the upper pole left renal cortex.  Critical Value/emergent results were called by telephone at the time of interpretation on 09/27/2014 at 1:33 pm to Dr. Blanchie Dessert , who verbally acknowledged these results.   Electronically Signed   By: Marin Olp M.D.   On: 09/27/2014 13:34    ASSESSMENT / PLAN: Acute Submassive  Bilateral Pulmonary Emboli w/ evidence of right heart strain Probable LLE DVT Tachycardia   Remote h/o right breast cancer (stage I invasive lobe carcinoma s/p xrt 2014) H/o chronic anemia (gi work up negative) GERD  Discussion  Suspect provoked acute DVT and subsequent PE due to immobility after recent breast reconstructive surgery. CT findings of RV/RL ration of 1.9; tachycardia and syncopal episode all support that this has been a submassive PE w/ evidence of right heart strain. Think that she would be a good candidate for catheter directed TPA  Plan Start IV heparin IR has been consulted for possible catheter directed TPA Ck ECHO  CK LE dopplers PPI for SUP Will decide of NOAC vs coumadin after completion of cath directed TPA F/u chemistry and ck serial coags Will hold off on hypercoagulable panel given high likelihood this is provoked and also given mobility and h/o cancer may benefit from life long rx anyway  Erick Colace ACNP-BC [place] Pager # [phone] OR # [phone] if no answer  09/27/2014, 4:13  PM  Reviewed above, examined.  74 yo female presented with intermittent leg swelling and progressive dyspnea.  She was also having light-headedness, and had syncopal event today while going to the bathroom.  She was found to be tachycardic and elevated D dimer.  She had CT angiogram chest which showed extensive PE with RV:LV ratio of 1.9.  She still feels dizzy and has chest discomfort.  She does have some dyspnea.  She denies abdominal pain.  There is no hx of melena or blood in stool.  She denies hx of head injury, stroke, or brain surgery.  There is no hx of thrombo-embolic disease.  She does have hx of breast cancer and has been on tamoxifen.  She also had breast reconstruction surgery in January 2016 and has been relatively inactive since then.  She is alert, heart rate regular and tachycardic, no wheezing, abd soft, no edema.  Discussed pros/cons about catheter direct thrombolytic therapy.  Given her symptoms, tachycardic, clot burden, and  elevated RV:LV ratio I think the benefit for EKOS outweigh the risk.  I do not see any specific contra-indications for EKOS.  She is agreeable to d/w IR further.  Will continue with heparin gtt for now.  Will need to discuss options for long term anti-coagulation after she has completed EKOS >> will likely benefit from having her oncologist (Dr. Lindi Adie) participate in that conversation and also discuss plans for tamoxifen therapy.  Updated pt's family at bedside.  CC time by me independent of APP time is 35 minutes.  Chesley Mires, MD [facility] Pulmonary/Critical Care 09/27/2014, 4:13 PM Pager:  [phone] After 3pm call: [phone]

## 2014-09-28 ENCOUNTER — Inpatient Hospital Stay (HOSPITAL_COMMUNITY): Payer: Medicare Other

## 2014-09-28 DIAGNOSIS — I2699 Other pulmonary embolism without acute cor pulmonale: Secondary | ICD-10-CM

## 2014-09-28 LAB — URINALYSIS, ROUTINE W REFLEX MICROSCOPIC
Bilirubin Urine: NEGATIVE
Glucose, UA: NEGATIVE mg/dL
KETONES UR: 15 mg/dL — AB
LEUKOCYTES UA: NEGATIVE
NITRITE: NEGATIVE
Protein, ur: NEGATIVE mg/dL
SPECIFIC GRAVITY, URINE: 1.028 (ref 1.005–1.030)
Urobilinogen, UA: 0.2 mg/dL (ref 0.0–1.0)
pH: 5 (ref 5.0–8.0)

## 2014-09-28 LAB — FIBRINOGEN
FIBRINOGEN: 264 mg/dL (ref 204–475)
Fibrinogen: 278 mg/dL (ref 204–475)

## 2014-09-28 LAB — HEPARIN LEVEL (UNFRACTIONATED)
HEPARIN UNFRACTIONATED: 0.46 [IU]/mL (ref 0.30–0.70)
Heparin Unfractionated: 0.63 IU/mL (ref 0.30–0.70)
Heparin Unfractionated: 1.18 IU/mL — ABNORMAL HIGH (ref 0.30–0.70)

## 2014-09-28 LAB — BASIC METABOLIC PANEL
Anion gap: 7 (ref 5–15)
BUN: 17 mg/dL (ref 6–23)
CALCIUM: 8.1 mg/dL — AB (ref 8.4–10.5)
CHLORIDE: 111 mmol/L (ref 96–112)
CO2: 22 mmol/L (ref 19–32)
Creatinine, Ser: 0.98 mg/dL (ref 0.50–1.10)
GFR calc Af Amer: 65 mL/min — ABNORMAL LOW (ref >90)
GFR, EST NON AFRICAN AMERICAN: 56 mL/min — AB (ref >90)
Glucose, Bld: 138 mg/dL — ABNORMAL HIGH (ref 70–99)
Potassium: 4.1 mmol/L (ref 3.5–5.1)
Sodium: 140 mmol/L (ref 135–145)

## 2014-09-28 LAB — CBC
HCT: 26.1 % — ABNORMAL LOW (ref 36.0–46.0)
HEMATOCRIT: 26.7 % — AB (ref 36.0–46.0)
HEMATOCRIT: 23.8 % — AB (ref 36.0–46.0)
HEMOGLOBIN: 8 g/dL — AB (ref 12.0–15.0)
HEMOGLOBIN: 7.7 g/dL — AB (ref 12.0–15.0)
Hemoglobin: 7.1 g/dL — ABNORMAL LOW (ref 12.0–15.0)
MCH: 23.2 pg — ABNORMAL LOW (ref 26.0–34.0)
MCH: 23.3 pg — ABNORMAL LOW (ref 26.0–34.0)
MCH: 23.5 pg — ABNORMAL LOW (ref 26.0–34.0)
MCHC: 30 g/dL (ref 30.0–36.0)
MCHC: 29.5 g/dL — ABNORMAL LOW (ref 30.0–36.0)
MCHC: 29.8 g/dL — ABNORMAL LOW (ref 30.0–36.0)
MCV: 77.4 fL — ABNORMAL LOW (ref 78.0–100.0)
MCV: 78.9 fL (ref 78.0–100.0)
MCV: 78.8 fL (ref 78.0–100.0)
PLATELETS: 233 10*3/uL (ref 150–400)
Platelets: 209 10*3/uL (ref 150–400)
Platelets: 201 10*3/uL (ref 150–400)
RBC: 3.45 MIL/uL — AB (ref 3.87–5.11)
RBC: 3.31 MIL/uL — AB (ref 3.87–5.11)
RBC: 3.02 MIL/uL — AB (ref 3.87–5.11)
RDW: 15.9 % — ABNORMAL HIGH (ref 11.5–15.5)
RDW: 15.9 % — ABNORMAL HIGH (ref 11.5–15.5)
RDW: 15.9 % — AB (ref 11.5–15.5)
WBC: 8.4 10*3/uL (ref 4.0–10.5)
WBC: 10.9 10*3/uL — AB (ref 4.0–10.5)
WBC: 10.1 10*3/uL (ref 4.0–10.5)

## 2014-09-28 LAB — IRON AND TIBC: UIBC: 360 ug/dL (ref 125–400)

## 2014-09-28 LAB — URINE MICROSCOPIC-ADD ON

## 2014-09-28 MED ORDER — LIDOCAINE HCL 1 % IJ SOLN
INTRAMUSCULAR | Status: AC
  Filled 2014-09-28: qty 20

## 2014-09-28 MED ORDER — SODIUM CHLORIDE 0.9 % IV SOLN
INTRAVENOUS | Status: DC
  Administered 2014-09-29: 06:00:00 via INTRAVENOUS

## 2014-09-28 MED ORDER — SODIUM CHLORIDE 0.9 % IJ SOLN
3.0000 mL | Freq: Two times a day (BID) | INTRAMUSCULAR | Status: DC
  Administered 2014-09-28 – 2014-09-29 (×2): 3 mL via INTRAVENOUS

## 2014-09-28 MED ORDER — ALTEPLASE 50 MG IV SOLR
12.0000 mg | Freq: Once | INTRAVENOUS | Status: AC
  Administered 2014-09-28: 12 mg via INTRAVENOUS
  Filled 2014-09-28: qty 12

## 2014-09-28 MED ORDER — FENTANYL CITRATE 0.05 MG/ML IJ SOLN
INTRAMUSCULAR | Status: AC
  Filled 2014-09-28: qty 2

## 2014-09-28 MED ORDER — SODIUM CHLORIDE 0.9 % IJ SOLN: 3.0000 mL | INTRAMUSCULAR | Status: DC | PRN

## 2014-09-28 MED ORDER — MIDAZOLAM HCL 2 MG/2ML IJ SOLN
INTRAMUSCULAR | Status: AC | PRN
  Administered 2014-09-28: 1 mg via INTRAVENOUS

## 2014-09-28 MED ORDER — SODIUM CHLORIDE 0.9 % IV SOLN: 250.0000 mL | INTRAVENOUS | Status: DC | PRN

## 2014-09-28 MED ORDER — SODIUM CHLORIDE 0.9 % IV SOLN
12.0000 mg | Freq: Once | INTRAVENOUS | Status: AC
  Administered 2014-09-28: 12 mg via INTRAVENOUS
  Filled 2014-09-28: qty 12

## 2014-09-28 MED ORDER — IOHEXOL 300 MG/ML  SOLN
50.0000 mL | Freq: Once | INTRAMUSCULAR | Status: AC | PRN
  Administered 2014-09-28: 50 mL via INTRAVENOUS

## 2014-09-28 MED ORDER — FENTANYL CITRATE 0.05 MG/ML IJ SOLN
INTRAMUSCULAR | Status: AC | PRN
  Administered 2014-09-28: 50 ug via INTRAVENOUS

## 2014-09-28 MED ORDER — SODIUM CHLORIDE 0.9 % IV SOLN
INTRAVENOUS | Status: DC
  Administered 2014-09-29 (×2): via INTRAVENOUS

## 2014-09-28 MED ORDER — SODIUM CHLORIDE 0.9 % IV SOLN
INTRAVENOUS | Status: DC
  Administered 2014-09-28: 18:00:00 via INTRAVENOUS

## 2014-09-28 MED ORDER — SODIUM CHLORIDE 0.9 % IV SOLN
INTRAVENOUS | Status: AC | PRN
  Administered 2014-09-28: 10 mL/h via INTRAVENOUS

## 2014-09-28 MED ORDER — MIDAZOLAM HCL 2 MG/2ML IJ SOLN
INTRAMUSCULAR | Status: AC
  Filled 2014-09-28: qty 2

## 2014-09-28 NOTE — Progress Notes (Signed)
*  PRELIMINARY RESULTS* Vascular Ultrasound Lower extremity venous duplex has been completed.  Preliminary findings: DVT noted in Right popliteal vein, posterior tibial veins, and peroneal veins. No DVT LLE.  Landry Mellow, RDMS, RVT  09/28/2014, 11:33 AM
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
## 2014-09-28 NOTE — Progress Notes (Signed)
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 She is s/p radiation therapy in Dec 2014. Has long standing h/o anemia. GI endoscopic eval has been negative. She is s/p breast reconstruction surgery in Jan 2016 for ruptured left breast implant. She was discharged to home on 1/7 w/ some limitation in mobility. Starting about 8 weeks prior to admit she started to note right groin and leg pain. Admitted on 4/8 w/ acute bilateral Pulmonary emboli and w/ evidence of right heart strain   SIGNIFICANT EVENTS    STUDIES:  4/08 CT chest >> Moderate burden of bilateral pulmonary emboli. Positive for acute PE with CT evidence of right heart strain (RV/LV Ratio = 1.9).  4/08 Echo >> EF 75%, mod RV dilation with decreased systolic fx, PAS 61 mmHg, mod/severe TR  SUBJECTIVE:  Short of breath with any exertion.  VITAL SIGNS: Temp:  [97.4 F (36.3 C)-98 F (36.7 C)] 98 F (36.7 C) (04/09 0757) Pulse Rate:  [100-128] 103 (04/09 0900) Resp:  [15-29] 23 (04/09 0900) BP: (89-127)/(40-90) 127/75 mmHg (04/09 0900) SpO2:  [98 %-100 %] 99 % (04/09 0900) Weight:  [198 lb 13.7 oz (90.2 kg)-202 lb 9.6 oz (91.9 kg)] 202 lb 9.6 oz (91.9 kg) (04/09 0500) PHYSICAL EXAMINATION: General:  Elderly 74 year old female lying comfortably in bed Neuro:  Awake, alert, no focal def  HEENT:  [place], no JVD Cardiovascular:  rrr (tachy) Lungs:  Clear decreased in bases  Abdomen:  Soft, non-tender  Musculoskeletal:  Intact  Skin:  LE trace R>L edema  CBC Recent Labs     09/27/14  1046  09/28/14  0304  WBC  10.6*  8.4  HGB  8.9*  8.0*  HCT  29.6*  26.7*  PLT  243  233    Coag's Recent Labs     09/27/14  1457  APTT  28  INR  1.08    BMET Recent Labs     09/27/14  1046  09/28/14  0304  NA  140  140  K  4.0  4.1  CL  107  111  CO2  23  22  BUN  16  17  CREATININE  1.06  0.98  GLUCOSE  153*  138*    Electrolytes Recent Labs     09/27/14  1046  09/28/14  0304  CALCIUM  8.5  8.1*   Liver Enzymes Recent Labs     09/27/14  1046  AST  34  ALT  23  ALKPHOS  61  BILITOT  0.5  ALBUMIN  3.0*   Glucose Recent Labs     09/27/14  1701  GLUCAP  136*    Imaging Dg Chest 2 View  09/27/2014   CLINICAL DATA:  Shortness of breath for a few days.  EXAM: CHEST  2 VIEW  COMPARISON:  PA and lateral chest 07/26/2013.  FINDINGS: The lungs are clear. Heart size is normal. No pneumothorax or pleural effusion. Small hiatal hernia is noted. Calcified breast implants seen on the prior study are no longer visualized.  IMPRESSION: No acute disease.  Small hiatal hernia.   Electronically Signed   By: Inge Rise M.D.   On: 09/27/2014 11:10   Ct Angio Chest  Pe W/cm &/or Wo Cm  09/27/2014   CLINICAL DATA:  73YOF today pt was on the toilet and passed out. Small hematoma to head. GEMS reported that pt was orthostatic and there was a loss of radial pulse while standing. History of right breast cancer.  EXAM: CT ANGIOGRAPHY CHEST WITH CONTRAST  TECHNIQUE: Multidetector CT imaging of the chest was performed using the standard protocol during bolus administration of intravenous contrast. Multiplanar CT image reconstructions and MIPs were obtained to evaluate the vascular anatomy.  CONTRAST:  60mL OMNIPAQUE IOHEXOL 350 MG/ML SOLN  COMPARISON:  CT abdomen 08/09/2013 and chest x-ray today.  FINDINGS: Lungs are clear.  Airways are normal.  Heart is normal in size. Pulmonary arterial system is well opacified and demonstrates moderate burden of emboli over the origin of the upper and lower lobar pulmonary arteries bilaterally as well as the proximal lingular and right middle lobe pulmonary arteries. There is evidence of right heart strain with elevated RV/LV ratio of 50.5 mm/27.2  mm = 1.9 (normal less than 0.9).  There is a moderate size hiatal hernia. Remaining mediastinal structures are unremarkable. Bilateral breast implants present.  Images through the upper abdomen demonstrate a 3 mm calcification over the upper pole left kidney. Mild degenerative change of the spine. No acute fractures.  Review of the MIP images confirms the above findings.  IMPRESSION: Moderate burden of bilateral pulmonary emboli. Positive for acute PE with CT evidence of right heart strain (RV/LV Ratio = 1.9) consistent with at least submassive (intermediate risk) PE. The presence of right heart strain has been associated with an increased risk of morbidity and mortality. Please activate Code PE by paging [phone].  Moderate size hiatal hernia.  3 mm calcification over the upper pole left renal cortex.  Critical Value/emergent results were called by telephone at the time of interpretation on 09/27/2014 at 1:33 pm to Dr. Blanchie Dessert , who verbally acknowledged these results.   Electronically Signed   By: Marin Olp M.D.   On: 09/27/2014 13:34      ASSESSMENT / PLAN:  Acute submassive PE with RV strain Plan: - continue heparin gtt - f/u doppler leg - EKOS per IR if CT head okay - oxygen to keep SpO2 > 92%  Hx of breast cancer >> stage I. Plan: - hold tamoxifen - will need to d/w her oncologist (Dr. Lindi Adie) about plans for tamoxifen and help with determining long term anticoagulation strategy  Anemia of chronic disease >> has extensive previous outpt w/u. Plan: - transfuse for Hb < 7 - check iron panel  Hx of GERD. Plan: - protonix  Hx of HTN. Plan: - monitor hemodynamics - hold lisinopril  Updated pt's husband.  D/w Dr. Barbie Banner.   Chesley Mires, MD [place] 09/28/2014, 10:46 AM Pager:  [phone] After 3pm call: [phone]

## 2014-09-28 NOTE — Progress Notes (Addendum)
ANTICOAGULATION CONSULT NOTE - Follow Up Consult  Pharmacy Consult for Heparin Indication: pulmonary embolus + DVT  No Known Allergies  Patient Measurements: Height: 5\' 5"  (165.1 cm) Weight: 202 lb 9.6 oz (91.9 kg) IBW/kg (Calculated) : 57 Heparin Dosing Weight: 77 kg  Vital Signs: Temp: 98.4 F (36.9 C) (04/09 1146) Temp Source: Oral (04/09 1146) BP: 127/75 mmHg (04/09 0900) Pulse Rate: 103 (04/09 0900)  Labs:  Recent Labs  09/27/14 1046 09/27/14 1457 09/27/14 2311 09/28/14 0304  HGB 8.9*  --   --  8.0*  HCT 29.6*  --   --  26.7*  PLT 243  --   --  233  APTT  --  28  --   --   LABPROT  --  14.1  --   --   INR  --  1.08  --   --   HEPARINUNFRC  --   --  0.66 0.63  CREATININE 1.06  --   --  0.98    Estimated Creatinine Clearance: 58.2 mL/min (by C-G formula based on Cr of 0.98).   Medications:  Heparin @ 1100 units/hr (11 ml/hr)  Assessment: 67 YOF who continues on heparin for a new acute submassive PE + DVT (confirmed via dopplers on 4/9) with a therapeutic heparin level this morning (HL 0.63 <<0.66, goal of 0.3-0.7). Hgb/Hct slight drop, no overt s/sx of bleeding noted. Will continue at the current rate for now.   Goal of Therapy:  Heparin level 0.3-0.7 units/ml Monitor platelets by anticoagulation protocol: Yes   Plan:  1. Continue Heparin at 1100 units/hr (11 ml/hr) 2. Will continue to monitor for any signs/symptoms of bleeding and will follow up with heparin level in the a.m.   Alycia Rossetti, PharmD, BCPS Clinical Pharmacist Pager: [phone] 09/28/2014 1:37 PM    ------------------------------------------------------------------------------------------------------------------- Addendum:  The patient is s/p EKOS this afternoon with concurrent tPA ordered for ~12 hours. Will recheck a heparin level this evening to assure still therapeutic and attempt to clarify stop time with IR.  Plan 1. Continue heparin at 1100 units/hr 2. Will check a HL at 2200  this evening to ensure rate appropriate 3. Will continue to monitor for s/sx of bleeding 4. Will attempt to verify stop time with IR  Alycia Rossetti, PharmD, BCPS Clinical Pharmacist Pager: [phone] 09/28/2014 4:03 PM

## 2014-09-28 NOTE — Sedation Documentation (Signed)
Right pulmonary artery- 50/23 (31)

## 2014-09-28 NOTE — Sedation Documentation (Signed)
Patient denies pain and is resting comfortably.  

## 2014-09-28 NOTE — Progress Notes (Signed)
C/o burning sensation when voiding. Dr Tamala Julian ordered UA

## 2014-09-28 NOTE — Sedation Documentation (Signed)
Left pulmonary artery 56/23 (36)

## 2014-09-28 NOTE — Progress Notes (Signed)
ANTICOAGULATION CONSULT NOTE - Follow Up Consult  Pharmacy Consult for heparin Indication: pulmonary embolus  Labs:  Recent Labs  09/27/14 1046 09/27/14 1457 09/27/14 2311  HGB 8.9*  --   --   HCT 29.6*  --   --   PLT 243  --   --   APTT  --  28  --   LABPROT  --  14.1  --   INR  --  1.08  --   HEPARINUNFRC  --   --  0.66  CREATININE 1.06  --   --      Assessment/Plan:  74yo female therapeutic on heparin with initial dosing for PE. Will continue gtt at current rate and confirm stable with am labs.   Wynona Neat, PharmD, BCPS  09/28/2014,12:03 AM

## 2014-09-28 NOTE — Progress Notes (Signed)
ANTICOAGULATION CONSULT NOTE - Follow Up Consult  Pharmacy Consult for Heparin Indication: pulmonary embolus + DVT  No Known Allergies  Patient Measurements: Height: 5\' 5"  (165.1 cm) Weight: 202 lb 9.6 oz (91.9 kg) IBW/kg (Calculated) : 57 Heparin Dosing Weight: 77 kg  Vital Signs: Temp: 97.7 F (36.5 C) (04/09 1934) Temp Source: Oral (04/09 1934) BP: 124/68 mmHg (04/09 2000) Pulse Rate: 109 (04/09 2000)  Labs:  Recent Labs  09/27/14 1046 09/27/14 1457  09/28/14 0304 09/28/14 1824 09/28/14 2200  HGB 8.9*  --   --  8.0* 7.7* 7.1*  HCT 29.6*  --   --  26.7* 26.1* 23.8*  PLT 243  --   --  233 209 201  APTT  --  28  --   --   --   --   LABPROT  --  14.1  --   --   --   --   INR  --  1.08  --   --   --   --   HEPARINUNFRC  --   --   < > 0.63 1.18* 0.46  CREATININE 1.06  --   --  0.98  --   --   < > = values in this interval not displayed.  Estimated Creatinine Clearance: 58.2 mL/min (by C-G formula based on Cr of 0.98).   Medications:  Heparin @ 1100 units/hr (11 ml/hr)  Assessment: 47 YOF who continues on heparin for a new acute submassive PE + DVT (confirmed via dopplers on 4/9).  Heparin level tonight is 0.46 pm 1100 units/hr and s/p EKOS this afternoon with concurrent tPA ordered for ~12 hours.  The tPA infusions should stop at 0430 on 4/10.    Heparin level remains therapeutic., goal of 0.3-0.7. Hgb/Hct has decreased to 7.1/23.8 and pltc = 264. No overt s/sx of bleeding noted per RN's report. Will continue at the current rate for now.   Goal of Therapy:  Heparin level 0.3-0.7 units/ml Monitor platelets by anticoagulation protocol: Yes   Plan:  1. Continue Heparin at 1100 units/hr (11 ml/hr) 2. Will continue to monitor for any signs/symptoms of bleeding and will follow up with heparin level in the a.m.  3. F/u for discontinuation of tPA infusion (due to end at 0430 in AM)   Nicole Cella, RPh Clinical Pharmacist Pager: [phone] 09/28/2014 10:48 PM

## 2014-09-28 NOTE — Procedures (Signed)
B PE EKOS lysis L PA pressure - 56/23 (36) R PA pressure - 50/23 (31) No comp

## 2014-09-29 ENCOUNTER — Inpatient Hospital Stay (HOSPITAL_COMMUNITY): Payer: Medicare Other

## 2014-09-29 LAB — CBC
HCT: 22.9 % — ABNORMAL LOW (ref 36.0–46.0)
Hemoglobin: 6.9 g/dL — CL (ref 12.0–15.0)
MCH: 22.9 pg — ABNORMAL LOW (ref 26.0–34.0)
MCHC: 29.7 g/dL — ABNORMAL LOW (ref 30.0–36.0)
MCV: 77.1 fL — AB (ref 78.0–100.0)
Platelets: 210 10*3/uL (ref 150–400)
RBC: 2.97 MIL/uL — ABNORMAL LOW (ref 3.87–5.11)
RDW: 16 % — AB (ref 11.5–15.5)
WBC: 8.5 10*3/uL (ref 4.0–10.5)

## 2014-09-29 LAB — BASIC METABOLIC PANEL
Anion gap: 13 (ref 5–15)
BUN: 12 mg/dL (ref 6–23)
CALCIUM: 7.4 mg/dL — AB (ref 8.4–10.5)
CO2: 17 mmol/L — ABNORMAL LOW (ref 19–32)
CREATININE: 0.68 mg/dL (ref 0.50–1.10)
Chloride: 108 mmol/L (ref 96–112)
GFR, EST NON AFRICAN AMERICAN: 85 mL/min — AB (ref >90)
GLUCOSE: 122 mg/dL — AB (ref 70–99)
Potassium: 3.9 mmol/L (ref 3.5–5.1)
SODIUM: 138 mmol/L (ref 135–145)

## 2014-09-29 LAB — FIBRINOGEN
FIBRINOGEN: 251 mg/dL (ref 204–475)
Fibrinogen: 299 mg/dL (ref 204–475)

## 2014-09-29 LAB — HEPARIN LEVEL (UNFRACTIONATED)
HEPARIN UNFRACTIONATED: 0.27 [IU]/mL — AB (ref 0.30–0.70)
Heparin Unfractionated: 2.14 IU/mL — ABNORMAL HIGH (ref 0.30–0.70)

## 2014-09-29 LAB — ABO/RH: ABO/RH(D): O POS

## 2014-09-29 LAB — PREPARE RBC (CROSSMATCH)

## 2014-09-29 LAB — FERRITIN: Ferritin: 17 ng/mL (ref 10–291)

## 2014-09-29 MED ORDER — HEPARIN (PORCINE) IN NACL 100-0.45 UNIT/ML-% IJ SOLN
1150.0000 [IU]/h | INTRAMUSCULAR | Status: DC
  Administered 2014-09-29: 1050 [IU]/h via INTRAVENOUS
  Administered 2014-09-30: 1150 [IU]/h via INTRAVENOUS
  Filled 2014-09-29 (×3): qty 250

## 2014-09-29 MED ORDER — SODIUM CHLORIDE 0.9 % IV SOLN
510.0000 mg | Freq: Once | INTRAVENOUS | Status: AC
  Administered 2014-09-29: 510 mg via INTRAVENOUS
  Filled 2014-09-29 (×2): qty 17

## 2014-09-29 MED ORDER — BENZONATATE 100 MG PO CAPS
100.0000 mg | ORAL_CAPSULE | Freq: Three times a day (TID) | ORAL | Status: DC | PRN
Start: 2014-09-29 — End: 2014-10-03
  Administered 2014-09-29 (×2): 100 mg via ORAL
  Filled 2014-09-29 (×4): qty 1

## 2014-09-29 MED ORDER — GUAIFENESIN-CODEINE 100-10 MG/5ML PO SOLN
5.0000 mL | Freq: Four times a day (QID) | ORAL | Status: DC | PRN
  Administered 2014-09-29 – 2014-10-03 (×5): 5 mL via ORAL
  Filled 2014-09-29 (×5): qty 5

## 2014-09-29 MED ORDER — SODIUM CHLORIDE 0.9 % IV SOLN
Freq: Once | INTRAVENOUS | Status: AC
  Administered 2014-09-29: 06:00:00 via INTRAVENOUS

## 2014-09-29 NOTE — Procedures (Deleted)
RLE lysis check  Clinical - Palpable DP and PT pulse at R ankle.  Find - R pop art widely patent with 3 vess RO. THrombus resolved  Comp

## 2014-09-29 NOTE — Progress Notes (Signed)
eLink Physician-Brief Progress Note Patient Name: [name] DOB: 02-08-1941 MRN: [id number]   Date of Service  09/29/2014  HPI/Events of Note  Cough  eICU Interventions  Tessalon capsule 100 mg po TID prn     Intervention Category Minor Interventions: Routine modifications to care plan (e.g. PRN medications for pain, fever)  DETERDING,Paulena 09/29/2014, 4:46 AM

## 2014-09-29 NOTE — Progress Notes (Signed)
CRITICAL VALUE ALERT  Critical value received:  hgb 6.9  Date of notification:  09/29/2014  Time of notification: 4982  Critical value read back:Yes.    Nurse who received alert:  Fayrene Helper  MD notified (1st page):  Dr. Jimmy Footman  Time of first page:  0500  MD notified (2nd page):  Time of second page:  Responding MD:  Dr. Jimmy Footman  Time MD responded:  0500

## 2014-09-29 NOTE — Procedures (Signed)
Post PE lysis  R PA pressure - 35/27 (31) L PA pressure - 34/29 (31)  No comp

## 2014-09-29 NOTE — Progress Notes (Signed)
[place] Progress Note Patient Name: [name] DOB: 09-26-1940 MRN: [id number]   Date of Service  09/29/2014  HPI/Events of Note  Hgb of 6.9 down from 7.1.  Had received thrombolytics  eICU Interventions  Plan: Transfuse 1 unit pRBC Post-transfusion CBC     Intervention Category Intermediate Interventions: Bleeding - evaluation and treatment with blood products  Ariany Kesselman,[place] 09/29/2014, 4:58 AM

## 2014-09-29 NOTE — Progress Notes (Addendum)
ANTICOAGULATION CONSULT NOTE - Follow Up Consult  Pharmacy Consult for Heparin Indication: pulmonary embolus + DVT  No Known Allergies  Patient Measurements: Height: 5\' 5"  (165.1 cm) Weight: 208 lb 1.8 oz (94.4 kg) IBW/kg (Calculated) : 57 Heparin Dosing Weight: 77 kg  Vital Signs: Temp: 99.1 F (37.3 C) (04/10 0812) Temp Source: Oral (04/10 0812) BP: 113/64 mmHg (04/10 0800) Pulse Rate: 98 (04/10 0800)  Labs:  Recent Labs  09/27/14 1046 09/27/14 1457  09/28/14 0304 09/28/14 1824 09/28/14 2200 09/29/14 0355  HGB 8.9*  --   --  8.0* 7.7* 7.1* 6.9*  HCT 29.6*  --   --  26.7* 26.1* 23.8* 22.9*  PLT 243  --   --  233 209 201 210  APTT  --  28  --   --   --   --   --   LABPROT  --  14.1  --   --   --   --   --   INR  --  1.08  --   --   --   --   --   HEPARINUNFRC  --   --   < > 0.63 1.18* 0.46 2.14*  CREATININE 1.06  --   --  0.98  --   --  0.68  < > = values in this interval not displayed.  Estimated Creatinine Clearance: 72.3 mL/min (by C-G formula based on Cr of 0.68).   Assessment: 56 YOF who continues on heparin for a new acute submassive PE + DVT (confirmed via dopplers on 4/9) who underwent EKOS on 4/9 afternoon with tPA and concurrent heparin given post-procedure. Heparin was turned off around 0800 this AM for a trip back to IR to remove the sheath. tPA therapy has been completed and heparin is to be restarted at 1000.  This morning's heparin level appears to have been drawn incorrectly and falsely elevated as the patient has been therapeutic on the set heparin rate previously. Will reduce the drip rate slightly upon initiation and will follow along with the q6h heparin levels ordered by IR.    Goal of Therapy:  Heparin level 0.3-0.7 units/ml Monitor platelets by anticoagulation protocol: Yes   Plan:  1. Restart Heparin at 1050 units/hr starting at 1000 this AM 2. Will continue to monitor for any signs/symptoms of bleeding and will follow up with heparin  level in 6 hours as ordered per IR  Alycia Rossetti, PharmD, BCPS Clinical Pharmacist Pager: [phone] 09/29/2014 9:32 AM    ----------------------------------------------------------------------------------------------------------------- Addendum:  Heparin level this evening resulted as slightly SUBtherapeutic (HL 0.27). Will increase drip rate slightly and will f/u with q6h HL as ordered per IR.  Plan 1. Adjust Heparin to 1150 units/hr (11.5 ml/hr) 2. Will continue to monitor for any signs/symptoms of bleeding and will follow up with heparin level in 6 hours (as ordered per IR)  Alycia Rossetti, PharmD, BCPS Clinical Pharmacist Pager: [phone] 09/29/2014 7:58 PM

## 2014-09-29 NOTE — Progress Notes (Signed)
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 She is s/p radiation therapy in Dec 2014. Has long standing h/o anemia. GI endoscopic eval has been negative. She is s/p breast reconstruction surgery in Jan 2016 for ruptured left breast implant. She was discharged to home on 1/7 w/ some limitation in mobility. Starting about 8 weeks prior to admit she started to note right groin and leg pain. Admitted on 4/8 w/ acute bilateral Pulmonary emboli and w/ evidence of right heart strain   SIGNIFICANT EVENTS  4/09>>>EKOS 4/10 1uRBC transfusion   STUDIES:  4/08 CT chest >> Moderate burden of bilateral pulmonary emboli. Positive for acute PE with CT evidence of right heart strain (RV/LV Ratio = 1.9).  4/08 Echo >> EF 75%, mod RV dilation with decreased systolic fx, PAS 61 mmHg, mod/severe TR 4/09 LE dopplers >>> acute DVT in rt popliteal vein, rt TB veing, and rt peroneal vein   SUBJECTIVE:  Complains of coughing and DOE. Denies active bleeding.   VITAL SIGNS: Temp:  [97.7 F (36.5 C)-98.9 F (37.2 C)] 98.6 F (37 C) (04/10 0707) Pulse Rate:  [95-117] 97 (04/10 0707) Resp:  [18-31] 26 (04/10 0707) BP: (80-154)/(38-109) 116/54 mmHg (04/10 0707) SpO2:  [94 %-100 %] 96 % (04/10 0707) Weight:  [208 lb 1.8 oz (94.4 kg)] 208 lb 1.8 oz (94.4 kg) (04/10 0500) PHYSICAL EXAMINATION: General:  Elderly 74 year old female lying comfortably in bed Neuro:  Awake, alert, no focal def  HEENT:  , no JVD Cardiovascular:  rrr (tachy) Lungs: rhonchi on ant auscultation Abdomen:  Soft, non-tender  Musculoskeletal:  Intact  Skin: trace pedal edema, tender to palpation of left calf.   CBC Recent Labs     09/28/14  1824  09/28/14  2200  09/29/14  0355  WBC   10.9*  10.1  8.5  HGB  7.7*  7.1*  6.9*  HCT  26.1*  23.8*  22.9*  PLT  209  201  210    Coag's Recent Labs     09/27/14  1457  APTT  28  INR  1.08    BMET Recent Labs     09/27/14  1046  09/28/14  0304  09/29/14  0355  NA  140  140  138  K  4.0  4.1  3.9  CL  107  111  108  CO2  23  22  17*  BUN  16  17  12   CREATININE  1.06  0.98  0.68  GLUCOSE  153*  138*  122*    Electrolytes Recent Labs     09/27/14  1046  09/28/14  0304  09/29/14  0355  CALCIUM  8.5  8.1*  7.4*   Liver Enzymes Recent Labs     09/27/14  1046  AST  34  ALT  23  ALKPHOS  61  BILITOT  0.5  ALBUMIN  3.0*   Glucose Recent Labs     09/27/14  1701  GLUCAP  136*    Imaging Dg Chest 2 View  09/27/2014   CLINICAL DATA:  Shortness of breath for a few days.  EXAM: CHEST  2 VIEW  COMPARISON:  PA and lateral chest 07/26/2013.  FINDINGS:  The lungs are clear. Heart size is normal. No pneumothorax or pleural effusion. Small hiatal hernia is noted. Calcified breast implants seen on the prior study are no longer visualized.  IMPRESSION: No acute disease.  Small hiatal hernia.   Electronically Signed   By: Inge Rise M.D.   On: 09/27/2014 11:10   Ct Head Wo Contrast  09/28/2014   CLINICAL DATA:  Acute pulmonary embolism. No history of breast cancer.  EXAM: CT HEAD WITHOUT CONTRAST  TECHNIQUE: Contiguous axial images were obtained from the base of the skull through the vertex without intravenous contrast.  COMPARISON:  Head CT 12/27/2007  FINDINGS: All No acute intracranial hemorrhage. No focal mass lesion. No CT evidence of acute infarction. No midline shift or mass effect. No hydrocephalus. Basilar cisterns are patent. Paranasal sinuses and mastoid air cells are clear. Mild periventricular white matter hypodensities.  IMPRESSION: No acute intracranial findings.  Mild white matter microvascular change.   Electronically Signed   By: Suzy Bouchard M.D.   On: 09/28/2014 11:09   Ct Angio Chest Pe W/cm  &/or Wo Cm  09/27/2014   CLINICAL DATA:  73YOF today pt was on the toilet and passed out. Small hematoma to head. GEMS reported that pt was orthostatic and there was a loss of radial pulse while standing. History of right breast cancer.  EXAM: CT ANGIOGRAPHY CHEST WITH CONTRAST  TECHNIQUE: Multidetector CT imaging of the chest was performed using the standard protocol during bolus administration of intravenous contrast. Multiplanar CT image reconstructions and MIPs were obtained to evaluate the vascular anatomy.  CONTRAST:  55mL OMNIPAQUE IOHEXOL 350 MG/ML SOLN  COMPARISON:  CT abdomen 08/09/2013 and chest x-ray today.  FINDINGS: Lungs are clear.  Airways are normal.  Heart is normal in size. Pulmonary arterial system is well opacified and demonstrates moderate burden of emboli over the origin of the upper and lower lobar pulmonary arteries bilaterally as well as the proximal lingular and right middle lobe pulmonary arteries. There is evidence of right heart strain with elevated RV/LV ratio of 50.5 mm/27.2 mm = 1.9 (normal less than 0.9).  There is a moderate size hiatal hernia. Remaining mediastinal structures are unremarkable. Bilateral breast implants present.  Images through the upper abdomen demonstrate a 3 mm calcification over the upper pole left kidney. Mild degenerative change of the spine. No acute fractures.  Review of the MIP images confirms the above findings.  IMPRESSION: Moderate burden of bilateral pulmonary emboli. Positive for acute PE with CT evidence of right heart strain (RV/LV Ratio = 1.9) consistent with at least submassive (intermediate risk) PE. The presence of right heart strain has been associated with an increased risk of morbidity and mortality. Please activate Code PE by paging [phone].  Moderate size hiatal hernia.  3 mm calcification over the upper pole left renal cortex.  Critical Value/emergent results were called by telephone at the time of interpretation on 09/27/2014 at 1:33  pm to Dr. Blanchie Dessert , who verbally acknowledged these results.   Electronically Signed   By: Marin Olp M.D.   On: 09/27/2014 13:34      ASSESSMENT / PLAN:  Acute submassive PE with RV strain 4/09 LE dopplers >>> acute DVT in rt popliteal vein, rt TB veing, and rt peroneal vein 4/09 EKOS lysis, no complications Plan: - s/p EKOS and tPA,  continue heparin gtt - oxygen to keep SpO2 > 92% - guaifenesin/codeine q6h prn for cough   Hx of breast cancer >> stage I. Plan: -  hold tamoxifen - will need to d/w her oncologist (Dr. Lindi Adie) about plans for tamoxifen and help with determining long term anticoagulation strategy  Anemia of chronic disease >> has extensive previous outpt w/u. 4/10>> transfused 1uRBC for hgb of 6.9 Plan: - transfuse for Hb < 7 - post transfusion CBC pending, then recheck CBC in the am - iron <10, ferritin normal, on iron supplements at home.  - IV feraheme 510mg  once  Hx of GERD. Plan: - protonix  Hx of HTN. Plan: - monitor hemodynamics - hold lisinopril  Ms. Cousineau is a 74 y/o female w/ hx of breast cancer on tamoxifen who presented with an acute submassive PE that is now s/p EKOS and tPA, will cont hep gtt. Vitals stable. hgb low at 6.9 and was transfused 1uRBC overnight, post cbc pending, will proceed with IV feraheme for severe iron deficiency anemia.   Julious Oka MD  Reviewed above, and examined.    She has been started on EKOS.  HR improved, and breathing improved.  Heart rate regular, lungs clear, abd soft.  She has drop in Hb, but no signs of bleeding.  Her iron level is < 10 >> will give IV iron.  Continue to monitor in ICU while she is getting thrombolytic.  Updated pt's husband at bedside.  Chesley Mires, MD [facility]/Critical [facility] 09/29/2014, 12:12 PM Pager:  [phone] After 3pm call: [phone]

## 2014-09-30 DIAGNOSIS — C50919 Malignant neoplasm of unspecified site of unspecified female breast: Secondary | ICD-10-CM

## 2014-09-30 LAB — CBC
HEMATOCRIT: 26.6 % — AB (ref 36.0–46.0)
HEMOGLOBIN: 8.3 g/dL — AB (ref 12.0–15.0)
MCH: 24.4 pg — AB (ref 26.0–34.0)
MCHC: 31.2 g/dL (ref 30.0–36.0)
MCV: 78.2 fL (ref 78.0–100.0)
Platelets: 201 10*3/uL (ref 150–400)
RBC: 3.4 MIL/uL — ABNORMAL LOW (ref 3.87–5.11)
RDW: 15.4 % (ref 11.5–15.5)
WBC: 8.3 10*3/uL (ref 4.0–10.5)

## 2014-09-30 LAB — HEPARIN LEVEL (UNFRACTIONATED)
Heparin Unfractionated: 0.37 IU/mL (ref 0.30–0.70)
Heparin Unfractionated: 0.51 IU/mL (ref 0.30–0.70)

## 2014-09-30 LAB — TYPE AND SCREEN
ABO/RH(D): O POS
Antibody Screen: NEGATIVE
Unit division: 0

## 2014-09-30 MED ORDER — RIVAROXABAN 20 MG PO TABS: 20.0000 mg | ORAL_TABLET | Freq: Every day | ORAL | Status: DC

## 2014-09-30 MED ORDER — FAMOTIDINE 20 MG PO TABS
20.0000 mg | ORAL_TABLET | Freq: Two times a day (BID) | ORAL | Status: DC
  Administered 2014-09-30 – 2014-10-03 (×7): 20 mg via ORAL
  Filled 2014-09-30 (×9): qty 1

## 2014-09-30 MED ORDER — RIVAROXABAN 15 MG PO TABS
15.0000 mg | ORAL_TABLET | Freq: Two times a day (BID) | ORAL | Status: DC
  Administered 2014-09-30 (×2): 15 mg via ORAL
  Filled 2014-09-30 (×5): qty 1

## 2014-09-30 NOTE — Progress Notes (Signed)
ANTICOAGULATION CONSULT NOTE - Initial Consult  Pharmacy Consult for rivaroxaban (Xarelto) Indication: pulmonary embolus and DVT  No Known Allergies  Patient Measurements: Height: 5\' 5"  (165.1 cm) Weight: 208 lb 8.9 oz (94.6 kg) IBW/kg (Calculated) : 57  Vital Signs: Temp: 98.5 F (36.9 C) (04/11 0807) Temp Source: Oral (04/11 0807) BP: 131/71 mmHg (04/11 1000) Pulse Rate: 91 (04/11 1000)  Labs:  Recent Labs  09/27/14 1046 09/27/14 1457  09/28/14 0304  09/28/14 2200 09/29/14 0355 09/29/14 1838 09/30/14 0013 09/30/14 0552  HGB 8.9*  --   --  8.0*  < > 7.1* 6.9*  --  8.3*  --   HCT 29.6*  --   --  26.7*  < > 23.8* 22.9*  --  26.6*  --   PLT 243  --   --  233  < > 201 210  --  201  --   APTT  --  28  --   --   --   --   --   --   --   --   LABPROT  --  14.1  --   --   --   --   --   --   --   --   INR  --  1.08  --   --   --   --   --   --   --   --   HEPARINUNFRC  --   --   < > 0.63  < > 0.46 2.14* 0.27* 0.37 0.51  CREATININE 1.06  --   --  0.98  --   --  0.68  --   --   --   < > = values in this interval not displayed.  Estimated Creatinine Clearance: 72.3 mL/min (by C-G formula based on Cr of 0.68).   Medical History: Past Medical History  Diagnosis Date  . Essential hypertension   . Hypercholesterolemia   . Vitamin D deficiency   . Fibromyalgia   . Anemia   . Depression     related to fibromyalgia  . Complication of anesthesia 2009    nausea  . GERD (gastroesophageal reflux disease)   . Diverticulosis   . Hiatal hernia   . 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 Iron deficiency anemia   . S/P radiation therapy 09/24/2013-10/17/2013    Right breast / 42.56 Gy in 16 fractions  . Use of tamoxifen (Nolvadex)     ????  . PONV (postoperative nausea and vomiting)     put scope patch on-still got sick last surgery  . Wears contact lenses     Medications:  Prescriptions prior  to admission  Medication Sig Dispense Refill Last Dose  . Ascorbic Acid (VITAMIN C) 100 MG tablet Take 100 mg by mouth daily.    09/26/2014 at Unknown time  . BIOTIN PO Take 1 tablet by mouth every morning.   09/26/2014 at Unknown time  . CALCIUM PO Take 1 capsule by mouth 2 (two) times daily. 1500mg  twice a day, chews.   09/26/2014 at Unknown time  . cholecalciferol (VITAMIN D) 1000 UNITS tablet Take 1,000 Units by mouth daily. D#3 type   09/26/2014 at Unknown time  . Cholecalciferol (VITAMIN D3) 5000 UNITS TABS Take 5,000 Units by mouth daily.   09/26/2014 at Unknown time  . iron polysaccharides (NIFEREX) 150 MG capsule Take 150 mg by  mouth daily. Pt takes medication once a week.   09/26/2014 at Unknown time  . lisinopril (PRINIVIL,ZESTRIL) 20 MG tablet Take 20 mg by mouth daily.   09/26/2014 at Unknown time  . Multiple Vitamin (MULTIVITAMIN) tablet Take 1 tablet by mouth daily. gummies   09/26/2014 at Unknown time  . non-metallic deodorant (ALRA) MISC Apply 1 application topically daily as needed.   09/26/2014 at Unknown time  . Omega-3 Fatty Acids (FISH OIL) 1000 MG CAPS Take 1 capsule by mouth 2 (two) times daily.   09/26/2014 at Unknown time  . Probiotic Product (PHILLIPS COLON HEALTH PO) Take 1 capsule by mouth daily.   09/26/2014 at Unknown time  . ranitidine (ZANTAC) 150 MG tablet Take 1 tablet (150 mg total) by mouth at bedtime. 30 tablet 2 09/26/2014 at Unknown time  . tamoxifen (NOLVADEX) 20 MG tablet Take 1 tablet (20 mg total) by mouth daily. 90 tablet 1 09/26/2014 at Unknown time  . vitamin E 100 UNIT capsule Take 100 Units by mouth daily.   09/26/2014 at Unknown time   Scheduled:  . famotidine  20 mg Oral BID  . pantoprazole (PROTONIX) IV  40 mg Intravenous Q24H   Infusions:  . heparin 1,150 Units/hr (09/30/14 0646)   PRN: benzonatate, fentaNYL, guaiFENesin-codeine  Assessment: 62 yoF w/ known h/o breast CA (stage I invasive lobe carcinoma). Received radiation therapy in Dec. 2014. Has long standing  h/o anemia. She is s/p breast reconstruction surgery in Jan 2016 for ruptured left breast implant. She was discharged to home on 06/27/14 w/ some limitation in mobility. Starting around 4 weeks prior to admit she started to note right groin and leg pain. Admitted on 4/8 w/ acute bilateral Pulmonary emboli and w/ evidence of right heart strain. Currently on heparin drip. Due to patient's lifelong tamoxifen therapy, h/o breast cancer, and extent of clotting (DVT and bilateral PE), initiate life-long rivaroxaban therapy at the treatment dose. SCr stable at 0.68, CrCl ~ 72 ml/min. Platelets 201. Received one unit of PRBCs after Hgb was 6.9 yesterday (4/10), but trending back up today (8.3). HCT trending up 22.9 >> 26.6. No signs of bleed.   Goal of Therapy: Monitor platelets by anticoagulation protocol: Yes   Plan:  Stop heparin drip now and immediately start rivaroxaban 15 mg twice daily with food for 21 days followed by 20 mg once daily with food indefinitely.   Roxy Horseman, PharmD Candidate 09/30/2014,10:38 AM   I agree with the assessment and plan. Will obtain CBC q72h while initiating Xarelto.   Hughes Better, PharmD, BCPS Clinical Pharmacist Pager: [phone] 09/30/2014 11:59 AM

## 2014-09-30 NOTE — Progress Notes (Addendum)
ANTICOAGULATION CONSULT NOTE - Follow Up Consult  Pharmacy Consult for heparin Indication: pulmonary embolus and DVT  Labs:  Recent Labs  09/27/14 1046 09/27/14 1457  09/28/14 0304  09/28/14 2200 09/29/14 0355 09/29/14 1838 09/30/14 0013  HGB 8.9*  --   --  8.0*  < > 7.1* 6.9*  --  8.3*  HCT 29.6*  --   --  26.7*  < > 23.8* 22.9*  --  26.6*  PLT 243  --   --  233  < > 201 210  --  201  APTT  --  28  --   --   --   --   --   --   --   LABPROT  --  14.1  --   --   --   --   --   --   --   INR  --  1.08  --   --   --   --   --   --   --   HEPARINUNFRC  --   --   < > 0.63  < > 0.46 2.14* 0.27* 0.37  CREATININE 1.06  --   --  0.98  --   --  0.68  --   --   < > = values in this interval not displayed.   Assessment/Plan:  74yo female therapeutic on heparin after rate increase. Will continue gtt at current rate and confirm stable with Q6H levels.   [name], PharmD, BCPS  09/30/2014,1:33 AM  ADDENDUM: 0600 heparin level remains therapeutic at 0.51, will recheck in [place], PharmD, BCPS 09/30/2014 6:47 AM

## 2014-09-30 NOTE — Progress Notes (Signed)
[facility] Progress Note Patient Name: [name] DOB: 01-18-1941 MRN: [id number]   Date of Service  09/30/2014  HPI/Events of Note  Called d/t patient c/o bilateral "black eyes". acute submassive PE that is now s/p EKOS and tPA. Heparin IV infusion now convert to [employer].  eICU Interventions  Tough situation. Patient requiring ongoing anti-coagulation for submassive PE. Will follow closely. If there is a progression of orbital swelling or visual changes, will need to consider stopping anti-coagulation and IVC filter placement.      Intervention Category Minor Interventions: Communication with other healthcare providers and/or family  Lysle Dingwall 09/30/2014, 9:18 PM

## 2014-09-30 NOTE — Progress Notes (Signed)
Referring Physician(s): PCCM  Subjective:  B PE with Rt heart strain R LE DVT PE thrombolysis in IR 4/10 Pt up in bed---breathing easier today Feels much better   Allergies: Review of patient's allergies indicates no known allergies.  Medications: Prior to Admission medications   Medication Sig Start Date End Date Taking? Authorizing Provider  Ascorbic Acid (VITAMIN C) 100 MG tablet Take 100 mg by mouth daily.    Yes Historical Provider, MD  BIOTIN PO Take 1 tablet by mouth every morning.   Yes Historical Provider, MD  CALCIUM PO Take 1 capsule by mouth 2 (two) times daily. 1500mg  twice a day, chews.   Yes Historical Provider, MD  cholecalciferol (VITAMIN D) 1000 UNITS tablet Take 1,000 Units by mouth daily. D#3 type   Yes Historical Provider, MD  Cholecalciferol (VITAMIN D3) 5000 UNITS TABS Take 5,000 Units by mouth daily.   Yes Historical Provider, MD  iron polysaccharides (NIFEREX) 150 MG capsule Take 150 mg by mouth daily. Pt takes medication once a week.   Yes Historical Provider, MD  lisinopril (PRINIVIL,ZESTRIL) 20 MG tablet Take 20 mg by mouth daily.   Yes Historical Provider, MD  Multiple Vitamin (MULTIVITAMIN) tablet Take 1 tablet by mouth daily. gummies   Yes Historical Provider, MD  non-metallic deodorant Jethro Poling) MISC Apply 1 application topically daily as needed.   Yes Historical Provider, MD  Omega-3 Fatty Acids (FISH OIL) 1000 MG CAPS Take 1 capsule by mouth 2 (two) times daily.   Yes Historical Provider, MD  Probiotic Product ([place]) Take 1 capsule by mouth daily.   Yes Historical Provider, MD  ranitidine (ZANTAC) 150 MG tablet Take 1 tablet (150 mg total) by mouth at bedtime. 07/05/13  Yes Lafayette Dragon, MD  tamoxifen (NOLVADEX) 20 MG tablet Take 1 tablet (20 mg total) by mouth daily. 03/14/14  Yes Minette Headland, NP  vitamin E 100 UNIT capsule Take 100 Units by mouth daily.   Yes Historical Provider, MD     Vital Signs: BP 124/66 mmHg   Pulse 88  Temp(Src) 98.5 F (36.9 C) (Oral)  Resp 19  Ht 5\' 5"  (1.651 m)  Wt 94.6 kg (208 lb 8.9 oz)  BMI 34.71 kg/m2  SpO2 94%  Physical Exam  Cardiovascular: Normal rate and regular rhythm.   Pulmonary/Chest: Effort normal and breath sounds normal. She has no wheezes.  Abdominal:  Rt groin NT; no bleeding No hematoma Rt foot 2+ pulses  Nursing note and vitals reviewed.   Imaging: Dg Chest 2 View  09/27/2014   CLINICAL DATA:  Shortness of breath for a few days.  EXAM: CHEST  2 VIEW  COMPARISON:  PA and lateral chest 07/26/2013.  FINDINGS: The lungs are clear. Heart size is normal. No pneumothorax or pleural effusion. Small hiatal hernia is noted. Calcified breast implants seen on the prior study are no longer visualized.  IMPRESSION: No acute disease.  Small hiatal hernia.   Electronically Signed   By: Inge Rise M.D.   On: 09/27/2014 11:10   Ct Head Wo Contrast  09/28/2014   CLINICAL DATA:  Acute pulmonary embolism. No history of breast cancer.  EXAM: CT HEAD WITHOUT CONTRAST  TECHNIQUE: Contiguous axial images were obtained from the base of the skull through the vertex without intravenous contrast.  COMPARISON:  Head CT 12/27/2007  FINDINGS: All No acute intracranial hemorrhage. No focal mass lesion. No CT evidence of acute infarction. No midline shift or mass effect. No hydrocephalus.  Basilar cisterns are patent. Paranasal sinuses and mastoid air cells are clear. Mild periventricular white matter hypodensities.  IMPRESSION: No acute intracranial findings.  Mild white matter microvascular change.   Electronically Signed   By: Suzy Bouchard M.D.   On: 09/28/2014 11:09   Ct Angio Chest Pe W/cm &/or Wo Cm  09/27/2014   CLINICAL DATA:  73YOF today pt was on the toilet and passed out. Small hematoma to head. [facility] reported that pt was orthostatic and there was a loss of radial pulse while standing. History of right breast cancer.  EXAM: CT ANGIOGRAPHY CHEST WITH CONTRAST  TECHNIQUE:  Multidetector CT imaging of the chest was performed using the standard protocol during bolus administration of intravenous contrast. Multiplanar CT image reconstructions and MIPs were obtained to evaluate the vascular anatomy.  CONTRAST:  68mL OMNIPAQUE IOHEXOL 350 MG/ML SOLN  COMPARISON:  CT abdomen 08/09/2013 and chest x-ray today.  FINDINGS: Lungs are clear.  Airways are normal.  Heart is normal in size. Pulmonary arterial system is well opacified and demonstrates moderate burden of emboli over the origin of the upper and lower lobar pulmonary arteries bilaterally as well as the proximal lingular and right middle lobe pulmonary arteries. There is evidence of right heart strain with elevated RV/LV ratio of 50.5 mm/27.2 mm = 1.9 (normal less than 0.9).  There is a moderate size hiatal hernia. Remaining mediastinal structures are unremarkable. Bilateral breast implants present.  Images through the upper abdomen demonstrate a 3 mm calcification over the upper pole left kidney. Mild degenerative change of the spine. No acute fractures.  Review of the MIP images confirms the above findings.  IMPRESSION: Moderate burden of bilateral pulmonary emboli. Positive for acute PE with CT evidence of right heart strain (RV/LV Ratio = 1.9) consistent with at least submassive (intermediate risk) PE. The presence of right heart strain has been associated with an increased risk of morbidity and mortality. Please activate Code PE by paging [phone].  Moderate size hiatal hernia.  3 mm calcification over the upper pole left renal cortex.  Critical Value/emergent results were called by telephone at the time of interpretation on 09/27/2014 at 1:33 pm to Dr. Blanchie Dessert , who verbally acknowledged these results.   Electronically Signed   By: Marin Olp M.D.   On: 09/27/2014 13:34   Ir Angiogram Pulmonary Bilateral Selective  09/29/2014   CLINICAL DATA:  Pulmonary thromboembolism.  Right heart strain.  EXAM: BILATERAL  PULMONARY ARTERIOGRAPHY; ADDITIONAL ARTERIOGRAPHY; IR ULTRASOUND GUIDANCE VASC ACCESS RIGHT; IR INFUSION THROMBOL ARTERIAL INITIAL (MS)  FLUOROSCOPY TIME:  6 minutes 24 seconds.  MEDICATIONS AND MEDICAL HISTORY: Versed 1 mg, Fentanyl 50 mcg.  Additional Medications: None.  ANESTHESIA/SEDATION: Moderate sedation time: 30 minutes  CONTRAST:  50 cc Omnipaque 300  PROCEDURE: The procedure, risks, benefits, and alternatives were explained to the patient. Questions regarding the procedure were encouraged and answered. The patient understands and consents to the procedure.  The right groin was prepped with Betadine in a sterile fashion, and a sterile drape was applied covering the operative field. A sterile gown and sterile gloves were used for the procedure.  Under sonographic guidance, a micropuncture needle was placed into the right common femoral vein and removed over a 018 wire which was up sized to a Bentson. A 7 French sheath was inserted. The identical procedure was performed in the same vein within additional 7 French sheath. A JB 1 catheter was advanced over a Bentson wire into the left pulmonary artery. Pressure  and angiography was performed. Left pulmonary artery pressure was measured at 56/23 with a mean of 36 mm Hg. The catheter was removed over a longer Rosen wire. The identical procedure was performed into the right pulmonary artery through the other sheath. Right pulmonary artery pressure was measured at 50/23 with a mean of 31 mm Hg. Right and left 18 and 12 cm EKOS infusion catheters were then advanced into the right and left pulmonary arteries. TPA infusion was instituted.  FINDINGS: Imaging confirms right and left pulmonary artery catheter position in the descending branches. Contrast injected confirms positioning and pulmonary thromboembolism.  COMPLICATIONS: None  IMPRESSION: Successful he close bilateral pulmonary artery thrombolytic infusion.   Electronically Signed   By: Marybelle Killings M.D.   On:  09/29/2014 08:46   Ir Angiogram Selective Each Additional Vessel  09/29/2014   CLINICAL DATA:  Pulmonary thromboembolism.  Right heart strain.  EXAM: BILATERAL PULMONARY ARTERIOGRAPHY; ADDITIONAL ARTERIOGRAPHY; IR ULTRASOUND GUIDANCE VASC ACCESS RIGHT; IR INFUSION THROMBOL ARTERIAL INITIAL (MS)  FLUOROSCOPY TIME:  6 minutes 24 seconds.  MEDICATIONS AND MEDICAL HISTORY: Versed 1 mg, Fentanyl 50 mcg.  Additional Medications: None.  ANESTHESIA/SEDATION: Moderate sedation time: 30 minutes  CONTRAST:  50 cc Omnipaque 300  PROCEDURE: The procedure, risks, benefits, and alternatives were explained to the patient. Questions regarding the procedure were encouraged and answered. The patient understands and consents to the procedure.  The right groin was prepped with Betadine in a sterile fashion, and a sterile drape was applied covering the operative field. A sterile gown and sterile gloves were used for the procedure.  Under sonographic guidance, a micropuncture needle was placed into the right common femoral vein and removed over a 018 wire which was up sized to a Bentson. A 7 French sheath was inserted. The identical procedure was performed in the same vein within additional 7 French sheath. A JB 1 catheter was advanced over a Bentson wire into the left pulmonary artery. Pressure and angiography was performed. Left pulmonary artery pressure was measured at 56/23 with a mean of 36 mm Hg. The catheter was removed over a longer Rosen wire. The identical procedure was performed into the right pulmonary artery through the other sheath. Right pulmonary artery pressure was measured at 50/23 with a mean of 31 mm Hg. Right and left 18 and 12 cm EKOS infusion catheters were then advanced into the right and left pulmonary arteries. TPA infusion was instituted.  FINDINGS: Imaging confirms right and left pulmonary artery catheter position in the descending branches. Contrast injected confirms positioning and pulmonary  thromboembolism.  COMPLICATIONS: None  IMPRESSION: Successful he close bilateral pulmonary artery thrombolytic infusion.   Electronically Signed   By: Marybelle Killings M.D.   On: 09/29/2014 08:46   Ir Angiogram Selective Each Additional Vessel  09/29/2014   CLINICAL DATA:  Pulmonary thromboembolism.  Right heart strain.  EXAM: BILATERAL PULMONARY ARTERIOGRAPHY; ADDITIONAL ARTERIOGRAPHY; IR ULTRASOUND GUIDANCE VASC ACCESS RIGHT; IR INFUSION THROMBOL ARTERIAL INITIAL (MS)  FLUOROSCOPY TIME:  6 minutes 24 seconds.  MEDICATIONS AND MEDICAL HISTORY: Versed 1 mg, Fentanyl 50 mcg.  Additional Medications: None.  ANESTHESIA/SEDATION: Moderate sedation time: 30 minutes  CONTRAST:  50 cc Omnipaque 300  PROCEDURE: The procedure, risks, benefits, and alternatives were explained to the patient. Questions regarding the procedure were encouraged and answered. The patient understands and consents to the procedure.  The right groin was prepped with Betadine in a sterile fashion, and a sterile drape was applied covering the operative field. A sterile gown  and sterile gloves were used for the procedure.  Under sonographic guidance, a micropuncture needle was placed into the right common femoral vein and removed over a 018 wire which was up sized to a Bentson. A 7 French sheath was inserted. The identical procedure was performed in the same vein within additional 7 French sheath. A JB 1 catheter was advanced over a Bentson wire into the left pulmonary artery. Pressure and angiography was performed. Left pulmonary artery pressure was measured at 56/23 with a mean of 36 mm Hg. The catheter was removed over a longer Rosen wire. The identical procedure was performed into the right pulmonary artery through the other sheath. Right pulmonary artery pressure was measured at 50/23 with a mean of 31 mm Hg. Right and left 18 and 12 cm EKOS infusion catheters were then advanced into the right and left pulmonary arteries. TPA infusion was  instituted.  FINDINGS: Imaging confirms right and left pulmonary artery catheter position in the descending branches. Contrast injected confirms positioning and pulmonary thromboembolism.  COMPLICATIONS: None  IMPRESSION: Successful he close bilateral pulmonary artery thrombolytic infusion.   Electronically Signed   By: Marybelle Killings M.D.   On: 09/29/2014 08:46   Ir US Guide Vasc Access Right  09/29/2014   CLINICAL DATA:  Pulmonary thromboembolism.  Right heart strain.  EXAM: BILATERAL PULMONARY ARTERIOGRAPHY; ADDITIONAL ARTERIOGRAPHY; IR ULTRASOUND GUIDANCE VASC ACCESS RIGHT; IR INFUSION THROMBOL ARTERIAL INITIAL (MS)  FLUOROSCOPY TIME:  6 minutes 24 seconds.  MEDICATIONS AND MEDICAL HISTORY: Versed 1 mg, Fentanyl 50 mcg.  Additional Medications: None.  ANESTHESIA/SEDATION: Moderate sedation time: 30 minutes  CONTRAST:  50 cc Omnipaque 300  PROCEDURE: The procedure, risks, benefits, and alternatives were explained to the patient. Questions regarding the procedure were encouraged and answered. The patient understands and consents to the procedure.  The right groin was prepped with Betadine in a sterile fashion, and a sterile drape was applied covering the operative field. A sterile gown and sterile gloves were used for the procedure.  Under sonographic guidance, a micropuncture needle was placed into the right common femoral vein and removed over a 018 wire which was up sized to a Bentson. A 7 French sheath was inserted. The identical procedure was performed in the same vein within additional 7 French sheath. A JB 1 catheter was advanced over a Bentson wire into the left pulmonary artery. Pressure and angiography was performed. Left pulmonary artery pressure was measured at 56/23 with a mean of 36 mm Hg. The catheter was removed over a longer Rosen wire. The identical procedure was performed into the right pulmonary artery through the other sheath. Right pulmonary artery pressure was measured at 50/23 with a mean  of 31 mm Hg. Right and left 18 and 12 cm EKOS infusion catheters were then advanced into the right and left pulmonary arteries. TPA infusion was instituted.  FINDINGS: Imaging confirms right and left pulmonary artery catheter position in the descending branches. Contrast injected confirms positioning and pulmonary thromboembolism.  COMPLICATIONS: None  IMPRESSION: Successful he close bilateral pulmonary artery thrombolytic infusion.   Electronically Signed   By: Marybelle Killings M.D.   On: 09/29/2014 08:46   Ir Infusion Thrombol Arterial Initial (ms)  09/29/2014   CLINICAL DATA:  Pulmonary thromboembolism.  Right heart strain.  EXAM: BILATERAL PULMONARY ARTERIOGRAPHY; ADDITIONAL ARTERIOGRAPHY; IR ULTRASOUND GUIDANCE VASC ACCESS RIGHT; IR INFUSION THROMBOL ARTERIAL INITIAL (MS)  FLUOROSCOPY TIME:  6 minutes 24 seconds.  MEDICATIONS AND MEDICAL HISTORY: Versed 1 mg, Fentanyl 50 mcg.  Additional Medications: None.  ANESTHESIA/SEDATION: Moderate sedation time: 30 minutes  CONTRAST:  50 cc Omnipaque 300  PROCEDURE: The procedure, risks, benefits, and alternatives were explained to the patient. Questions regarding the procedure were encouraged and answered. The patient understands and consents to the procedure.  The right groin was prepped with Betadine in a sterile fashion, and a sterile drape was applied covering the operative field. A sterile gown and sterile gloves were used for the procedure.  Under sonographic guidance, a micropuncture needle was placed into the right common femoral vein and removed over a 018 wire which was up sized to a Bentson. A 7 French sheath was inserted. The identical procedure was performed in the same vein within additional 7 French sheath. A JB 1 catheter was advanced over a Bentson wire into the left pulmonary artery. Pressure and angiography was performed. Left pulmonary artery pressure was measured at 56/23 with a mean of 36 mm Hg. The catheter was removed over a longer Rosen wire. The  identical procedure was performed into the right pulmonary artery through the other sheath. Right pulmonary artery pressure was measured at 50/23 with a mean of 31 mm Hg. Right and left 18 and 12 cm EKOS infusion catheters were then advanced into the right and left pulmonary arteries. TPA infusion was instituted.  FINDINGS: Imaging confirms right and left pulmonary artery catheter position in the descending branches. Contrast injected confirms positioning and pulmonary thromboembolism.  COMPLICATIONS: None  IMPRESSION: Successful he close bilateral pulmonary artery thrombolytic infusion.   Electronically Signed   By: Marybelle Killings M.D.   On: 09/29/2014 08:46   Ir Jacolyn Reedy F/u Eval Art/ven Final Day (ms)  09/29/2014   CLINICAL DATA:  Follow-up EKOS pulmonary artery thrombolytic therapy.  EXAM: IR THROMB F/U EVAL ART/VEN FINAL DAY  FLUOROSCOPY TIME:  20 seconds.  MEDICATIONS AND MEDICAL HISTORY: None  ANESTHESIA/SEDATION: None  CONTRAST:  None  PROCEDURE: The procedure, risks, benefits, and alternatives were explained to the patient. Questions regarding the procedure were encouraged and answered. The patient understands and consents to the procedure.  Right and left pulmonary artery pressures were obtained. Right pulmonary artery pressure is 35/27 with a mean of 31 mm Hg. That of the left is 34/29 with a mean of 31 mm Hg. The catheters and sheaths were removed. Hemostasis was achieved with direct pressure and VPAD.  FINDINGS: Fluoroscopic imaging confirms stable position of the right and left pulmonary artery EKOS catheters.  COMPLICATIONS: None  IMPRESSION: Successful conclusion of bilateral pulmonary artery EKOS thrombo lytic therapy. Peak systolic pressures have significantly improved from 56 to 35 mm Hg in the right pulmonary artery and 50 to 34 mm Hg in the left pulmonary artery.   Electronically Signed   By: Marybelle Killings M.D.   On: 09/29/2014 09:21    Labs:  CBC:  Recent Labs  09/28/14 1824  09/28/14 2200 09/29/14 0355 09/30/14 0013  WBC 10.9* 10.1 8.5 8.3  HGB 7.7* 7.1* 6.9* 8.3*  HCT 26.1* 23.8* 22.9* 26.6*  PLT 209 201 210 201    COAGS:  Recent Labs  09/27/14 1457  INR 1.08  APTT 28    BMP:  Recent Labs  06/20/14 1200 09/27/14 1046 09/28/14 0304 09/29/14 0355  NA 139 140 140 138  K 3.9 4.0 4.1 3.9  CL 102 107 111 108  CO2 28 23 22  17*  GLUCOSE 96 153* 138* 122*  BUN 12 16 17 12   CALCIUM 9.4 8.5 8.1* 7.4*  CREATININE 0.73  1.06 0.98 0.68  GFRNONAA 83* 51* 56* 85*  GFRAA >90 59* 65* >90    LIVER FUNCTION TESTS:  Recent Labs  11/01/13 1335 05/08/14 1417 09/27/14 1046  BILITOT 0.39 0.44 0.5  AST 25 18 34  ALT 22 15 23   ALKPHOS 85 57 61  PROT 7.2 6.6 5.6*  ALBUMIN 3.9 3.7 3.0*    Assessment and Plan:  B PE with RLE DVT + Rt heart strain Lysis in IR 4/10 Up in bed Breathing easy No complaints O2 sat 96% RA Will follow as needed Follow up in IR clinic---pt will hear from [place] for timeand date  Signed: Dameir Gentzler A 09/30/2014, 9:49 AM   I spent a total of 15 Minutes in face to face in clinical consultation/evaluation, greater than 50% of which was counseling/coordinating care for PE lysis

## 2014-09-30 NOTE — Progress Notes (Addendum)
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 She is s/p radiation therapy in Dec 2014. Has long standing h/o anemia. GI endoscopic eval has been negative. She is s/p breast reconstruction surgery in Jan 2016 for ruptured left breast implant. She was discharged to home on 1/7 w/ some limitation in mobility. Starting about 8 weeks prior to admit she started to note right groin and leg pain. Admitted on 4/8 w/ acute bilateral Pulmonary emboli and w/ evidence of right heart strain   SIGNIFICANT EVENTS  4/09>>>EKOS 4/10>> 1uRBC transfusion   STUDIES:  4/08 CT chest >> Moderate burden of bilateral pulmonary emboli. Positive for acute PE with CT evidence of right heart strain (RV/LV Ratio = 1.9).  4/08 Echo >> EF 75%, mod RV dilation with decreased systolic fx, PAS 61 mmHg, mod/severe TR 4/09 LE dopplers >>> acute DVT in rt popliteal vein, rt TB veing, and rt peroneal vein   SUBJECTIVE:  Slept well overnight, no complaints.   VITAL SIGNS: Temp:  [98.5 F (36.9 C)-99 F (37.2 C)] 98.5 F (36.9 C) (04/11 0807) Pulse Rate:  [84-105] 88 (04/11 0700) Resp:  [17-37] 19 (04/11 0700) BP: (88-139)/(42-74) 124/66 mmHg (04/11 0700) SpO2:  [92 %-100 %] 94 % (04/11 0700) Weight:  [208 lb 8.9 oz (94.6 kg)] 208 lb 8.9 oz (94.6 kg) (04/11 0500) PHYSICAL EXAMINATION: General:  Elderly 74 year old female lying comfortably in bed Neuro:  Awake, alert, no focal def  Cardiovascular:  RRR, no murmurs Lungs: clear  on ant auscultation Abdomen:  Soft, non-tender  Musculoskeletal:  Intact  Skin: trace pedal edema  CBC Recent Labs     09/28/14  2200  09/29/14  0355  09/30/14  0013  WBC  10.1  8.5  8.3  HGB  7.1*  6.9*  8.3*  HCT  23.8*  22.9*  26.6*   PLT  201  210  201    Coag's Recent Labs     09/27/14  1457  APTT  28  INR  1.08    BMET Recent Labs     09/27/14  1046  09/28/14  0304  09/29/14  0355  NA  140  140  138  K  4.0  4.1  3.9  CL  107  111  108  CO2  23  22  17*  BUN  16  17  12   CREATININE  1.06  0.98  0.68  GLUCOSE  153*  138*  122*    Electrolytes Recent Labs     09/27/14  1046  09/28/14  0304  09/29/14  0355  CALCIUM  8.5  8.1*  7.4*   Liver Enzymes Recent Labs     09/27/14  1046  AST  34  ALT  23  ALKPHOS  61  BILITOT  0.5  ALBUMIN  3.0*   Glucose Recent Labs     09/27/14  1701  GLUCAP  136*    Imaging Ct Head Wo Contrast  09/28/2014   CLINICAL DATA:  Acute pulmonary embolism. No history of breast cancer.  EXAM: CT HEAD WITHOUT CONTRAST  TECHNIQUE: Contiguous axial images were obtained from the base of the skull through the vertex without intravenous contrast.  COMPARISON:  Head CT 12/27/2007  FINDINGS: All No acute intracranial hemorrhage. No focal mass lesion. No CT evidence of acute infarction. No midline shift or mass effect. No hydrocephalus. Basilar cisterns are patent. Paranasal sinuses and mastoid air cells are clear. Mild periventricular white matter hypodensities.  IMPRESSION: No acute intracranial findings.  Mild white matter microvascular change.   Electronically Signed   By: Suzy Bouchard M.D.   On: 09/28/2014 11:09   Ir Angiogram Pulmonary Bilateral Selective  09/29/2014   CLINICAL DATA:  Pulmonary thromboembolism.  Right heart strain.  EXAM: BILATERAL PULMONARY ARTERIOGRAPHY; ADDITIONAL ARTERIOGRAPHY; IR ULTRASOUND GUIDANCE VASC ACCESS RIGHT; IR INFUSION THROMBOL ARTERIAL INITIAL (MS)  FLUOROSCOPY TIME:  6 minutes 24 seconds.  MEDICATIONS AND MEDICAL HISTORY: Versed 1 mg, Fentanyl 50 mcg.  Additional Medications: None.  ANESTHESIA/SEDATION: Moderate sedation time: 30 minutes  CONTRAST:  50 cc Omnipaque 300  PROCEDURE: The procedure, risks, benefits, and alternatives were  explained to the patient. Questions regarding the procedure were encouraged and answered. The patient understands and consents to the procedure.  The right groin was prepped with Betadine in a sterile fashion, and a sterile drape was applied covering the operative field. A sterile gown and sterile gloves were used for the procedure.  Under sonographic guidance, a micropuncture needle was placed into the right common femoral vein and removed over a 018 wire which was up sized to a Bentson. A 7 French sheath was inserted. The identical procedure was performed in the same vein within additional 7 French sheath. A JB 1 catheter was advanced over a Bentson wire into the left pulmonary artery. Pressure and angiography was performed. Left pulmonary artery pressure was measured at 56/23 with a mean of 36 mm Hg. The catheter was removed over a longer Rosen wire. The identical procedure was performed into the right pulmonary artery through the other sheath. Right pulmonary artery pressure was measured at 50/23 with a mean of 31 mm Hg. Right and left 18 and 12 cm EKOS infusion catheters were then advanced into the right and left pulmonary arteries. TPA infusion was instituted.  FINDINGS: Imaging confirms right and left pulmonary artery catheter position in the descending branches. Contrast injected confirms positioning and pulmonary thromboembolism.  COMPLICATIONS: None  IMPRESSION: Successful he close bilateral pulmonary artery thrombolytic infusion.   Electronically Signed   By: Marybelle Killings M.D.   On: 09/29/2014 08:46   Ir Angiogram Selective Each Additional Vessel  09/29/2014   CLINICAL DATA:  Pulmonary thromboembolism.  Right heart strain.  EXAM: BILATERAL PULMONARY ARTERIOGRAPHY; ADDITIONAL ARTERIOGRAPHY; IR ULTRASOUND GUIDANCE VASC ACCESS RIGHT; IR INFUSION THROMBOL ARTERIAL INITIAL (MS)  FLUOROSCOPY TIME:  6 minutes 24 seconds.  MEDICATIONS AND MEDICAL HISTORY: Versed 1 mg, Fentanyl 50 mcg.  Additional Medications:  None.  ANESTHESIA/SEDATION: Moderate sedation time: 30 minutes  CONTRAST:  50 cc Omnipaque 300  PROCEDURE: The procedure, risks, benefits, and alternatives were explained to the patient. Questions regarding the procedure were encouraged and answered. The patient understands and consents to the procedure.  The right groin was prepped with Betadine in a sterile fashion, and a sterile drape was applied covering the operative field. A sterile gown and sterile gloves were used for the procedure.  Under sonographic guidance, a micropuncture needle was placed into the right common femoral vein and removed over a 018 wire which was up sized to a Bentson. A 7 French sheath was inserted. The identical procedure was performed in the same vein within additional 7 French sheath. A JB 1 catheter  was advanced over a Bentson wire into the left pulmonary artery. Pressure and angiography was performed. Left pulmonary artery pressure was measured at 56/23 with a mean of 36 mm Hg. The catheter was removed over a longer Rosen wire. The identical procedure was performed into the right pulmonary artery through the other sheath. Right pulmonary artery pressure was measured at 50/23 with a mean of 31 mm Hg. Right and left 18 and 12 cm EKOS infusion catheters were then advanced into the right and left pulmonary arteries. TPA infusion was instituted.  FINDINGS: Imaging confirms right and left pulmonary artery catheter position in the descending branches. Contrast injected confirms positioning and pulmonary thromboembolism.  COMPLICATIONS: None  IMPRESSION: Successful he close bilateral pulmonary artery thrombolytic infusion.   Electronically Signed   By: Marybelle Killings M.D.   On: 09/29/2014 08:46   Ir Angiogram Selective Each Additional Vessel  09/29/2014   CLINICAL DATA:  Pulmonary thromboembolism.  Right heart strain.  EXAM: BILATERAL PULMONARY ARTERIOGRAPHY; ADDITIONAL ARTERIOGRAPHY; IR ULTRASOUND GUIDANCE VASC ACCESS RIGHT; IR INFUSION  THROMBOL ARTERIAL INITIAL (MS)  FLUOROSCOPY TIME:  6 minutes 24 seconds.  MEDICATIONS AND MEDICAL HISTORY: Versed 1 mg, Fentanyl 50 mcg.  Additional Medications: None.  ANESTHESIA/SEDATION: Moderate sedation time: 30 minutes  CONTRAST:  50 cc Omnipaque 300  PROCEDURE: The procedure, risks, benefits, and alternatives were explained to the patient. Questions regarding the procedure were encouraged and answered. The patient understands and consents to the procedure.  The right groin was prepped with Betadine in a sterile fashion, and a sterile drape was applied covering the operative field. A sterile gown and sterile gloves were used for the procedure.  Under sonographic guidance, a micropuncture needle was placed into the right common femoral vein and removed over a 018 wire which was up sized to a Bentson. A 7 French sheath was inserted. The identical procedure was performed in the same vein within additional 7 French sheath. A JB 1 catheter was advanced over a Bentson wire into the left pulmonary artery. Pressure and angiography was performed. Left pulmonary artery pressure was measured at 56/23 with a mean of 36 mm Hg. The catheter was removed over a longer Rosen wire. The identical procedure was performed into the right pulmonary artery through the other sheath. Right pulmonary artery pressure was measured at 50/23 with a mean of 31 mm Hg. Right and left 18 and 12 cm EKOS infusion catheters were then advanced into the right and left pulmonary arteries. TPA infusion was instituted.  FINDINGS: Imaging confirms right and left pulmonary artery catheter position in the descending branches. Contrast injected confirms positioning and pulmonary thromboembolism.  COMPLICATIONS: None  IMPRESSION: Successful he close bilateral pulmonary artery thrombolytic infusion.   Electronically Signed   By: Marybelle Killings M.D.   On: 09/29/2014 08:46   Ir US Guide Vasc Access Right  09/29/2014   CLINICAL DATA:  Pulmonary  thromboembolism.  Right heart strain.  EXAM: BILATERAL PULMONARY ARTERIOGRAPHY; ADDITIONAL ARTERIOGRAPHY; IR ULTRASOUND GUIDANCE VASC ACCESS RIGHT; IR INFUSION THROMBOL ARTERIAL INITIAL (MS)  FLUOROSCOPY TIME:  6 minutes 24 seconds.  MEDICATIONS AND MEDICAL HISTORY: Versed 1 mg, Fentanyl 50 mcg.  Additional Medications: None.  ANESTHESIA/SEDATION: Moderate sedation time: 30 minutes  CONTRAST:  50 cc Omnipaque 300  PROCEDURE: The procedure, risks, benefits, and alternatives were explained to the patient. Questions regarding the procedure were encouraged and answered. The patient understands and consents to the procedure.  The right groin was prepped with Betadine in a sterile fashion, and  a sterile drape was applied covering the operative field. A sterile gown and sterile gloves were used for the procedure.  Under sonographic guidance, a micropuncture needle was placed into the right common femoral vein and removed over a 018 wire which was up sized to a Bentson. A 7 French sheath was inserted. The identical procedure was performed in the same vein within additional 7 French sheath. A JB 1 catheter was advanced over a Bentson wire into the left pulmonary artery. Pressure and angiography was performed. Left pulmonary artery pressure was measured at 56/23 with a mean of 36 mm Hg. The catheter was removed over a longer Rosen wire. The identical procedure was performed into the right pulmonary artery through the other sheath. Right pulmonary artery pressure was measured at 50/23 with a mean of 31 mm Hg. Right and left 18 and 12 cm EKOS infusion catheters were then advanced into the right and left pulmonary arteries. TPA infusion was instituted.  FINDINGS: Imaging confirms right and left pulmonary artery catheter position in the descending branches. Contrast injected confirms positioning and pulmonary thromboembolism.  COMPLICATIONS: None  IMPRESSION: Successful he close bilateral pulmonary artery thrombolytic infusion.    Electronically Signed   By: Marybelle Killings M.D.   On: 09/29/2014 08:46   Ir Infusion Thrombol Arterial Initial (ms)  09/29/2014   CLINICAL DATA:  Pulmonary thromboembolism.  Right heart strain.  EXAM: BILATERAL PULMONARY ARTERIOGRAPHY; ADDITIONAL ARTERIOGRAPHY; IR ULTRASOUND GUIDANCE VASC ACCESS RIGHT; IR INFUSION THROMBOL ARTERIAL INITIAL (MS)  FLUOROSCOPY TIME:  6 minutes 24 seconds.  MEDICATIONS AND MEDICAL HISTORY: Versed 1 mg, Fentanyl 50 mcg.  Additional Medications: None.  ANESTHESIA/SEDATION: Moderate sedation time: 30 minutes  CONTRAST:  50 cc Omnipaque 300  PROCEDURE: The procedure, risks, benefits, and alternatives were explained to the patient. Questions regarding the procedure were encouraged and answered. The patient understands and consents to the procedure.  The right groin was prepped with Betadine in a sterile fashion, and a sterile drape was applied covering the operative field. A sterile gown and sterile gloves were used for the procedure.  Under sonographic guidance, a micropuncture needle was placed into the right common femoral vein and removed over a 018 wire which was up sized to a Bentson. A 7 French sheath was inserted. The identical procedure was performed in the same vein within additional 7 French sheath. A JB 1 catheter was advanced over a Bentson wire into the left pulmonary artery. Pressure and angiography was performed. Left pulmonary artery pressure was measured at 56/23 with a mean of 36 mm Hg. The catheter was removed over a longer Rosen wire. The identical procedure was performed into the right pulmonary artery through the other sheath. Right pulmonary artery pressure was measured at 50/23 with a mean of 31 mm Hg. Right and left 18 and 12 cm EKOS infusion catheters were then advanced into the right and left pulmonary arteries. TPA infusion was instituted.  FINDINGS: Imaging confirms right and left pulmonary artery catheter position in the descending branches. Contrast injected  confirms positioning and pulmonary thromboembolism.  COMPLICATIONS: None  IMPRESSION: Successful he close bilateral pulmonary artery thrombolytic infusion.   Electronically Signed   By: Marybelle Killings M.D.   On: 09/29/2014 08:46   Ir Jacolyn Reedy F/u Eval Art/ven Final Day (ms)  09/29/2014   CLINICAL DATA:  Follow-up EKOS pulmonary artery thrombolytic therapy.  EXAM: IR THROMB F/U EVAL ART/VEN FINAL DAY  FLUOROSCOPY TIME:  20 seconds.  MEDICATIONS AND MEDICAL HISTORY: None  ANESTHESIA/SEDATION: None  CONTRAST:  None  PROCEDURE: The procedure, risks, benefits, and alternatives were explained to the patient. Questions regarding the procedure were encouraged and answered. The patient understands and consents to the procedure.  Right and left pulmonary artery pressures were obtained. Right pulmonary artery pressure is 35/27 with a mean of 31 mm Hg. That of the left is 34/29 with a mean of 31 mm Hg. The catheters and sheaths were removed. Hemostasis was achieved with direct pressure and VPAD.  FINDINGS: Fluoroscopic imaging confirms stable position of the right and left pulmonary artery EKOS catheters.  COMPLICATIONS: None  IMPRESSION: Successful conclusion of bilateral pulmonary artery EKOS thrombo lytic therapy. Peak systolic pressures have significantly improved from 56 to 35 mm Hg in the right pulmonary artery and 50 to 34 mm Hg in the left pulmonary artery.   Electronically Signed   By: Marybelle Killings M.D.   On: 09/29/2014 09:21      ASSESSMENT / PLAN:  Acute submassive PE with RV strain 4/09 LE dopplers >>> acute DVT in rt popliteal vein, rt TB veing, and rt peroneal vein 4/09 EKOS lysis, no complications Plan: - transition heparin gtt to xarelto per pharm - guaifenesin/codeine q6h prn for cough   Hx of breast cancer >> stage I. Plan: - hold tamoxifen, will discuss w/ Dr. Beryle Beams risk for DVT w/ hx of malignancy. Pt was told she will be on life long tamoxifen, will likely continue xarelto indefinitely.  Pt informed.  - her oncologist is Dr. Lindi Adie at [facility]  Anemia of chronic disease >> has extensive previous outpt w/u. 4/10>> transfused 1uRBC for hgb of 6.9 4/10>> given IV feraheme 510mg  once Plan: - hbg 8.3 this morning, will continue to monitor - iron <10, ferritin normal, on iron supplements at home.    Hx of GERD. Plan: - protonix IV transitioned to pepcid 20mg  BID, on ranitidine at home  Hx of HTN. Plan: - vitals stable - can resume lisinopril as needed  Ms. Leisner is a 74 y/o female w/ hx of breast cancer on tamoxifen who presented with an acute submassive PE that is now s/p EKOS and tPA, will cont hep gtt. Hemodynamically stable. Starting pt on xarelto, will f/u with IR as outpatient. Transfer to tele today.    Julious Oka MD 09/30/2014, 8:17 AM   PCCM ATTENDING: I have reviewed pt's initial presentation, consultants notes and hospital database in detail.  The above assessment and plan was formulated under my direction.  In summary: Acute PE s/p EKOS RFs identified - malignancy, tamoxifen She is in no distress and hemodynamically stable Initiate NOAC today In my opinion, she should remain on anti-coagulation lifelong as she will be on Tamoxifen indefinitely and has hx of breast Ca In all likelihood, should be ready for DC home 4/12   Merton Border, MD;  PCCM service; [place] [phone]

## 2014-09-30 NOTE — Progress Notes (Signed)
Patient has 'blackeye' on both eyes that started this morning according to patient,it has gotten darker.No periorbital swelling noted.Patient was on heparin drip and was transitioned to xarelto.Patient denies any pain or discomfort.Dr. Oletta Darter notified.Will continue to monitor. Lacie Landry, Wonda Cheng, Therapist, sports

## 2014-09-30 NOTE — Progress Notes (Signed)
Utilization review completed. Avangelina Flight, RN, BSN. 

## 2014-10-01 ENCOUNTER — Encounter (HOSPITAL_COMMUNITY): Payer: Self-pay | Admitting: Radiology

## 2014-10-01 DIAGNOSIS — I82403 Acute embolism and thrombosis of unspecified deep veins of lower extremity, bilateral: Secondary | ICD-10-CM

## 2014-10-01 DIAGNOSIS — I10 Essential (primary) hypertension: Secondary | ICD-10-CM

## 2014-10-01 LAB — CBC
HEMATOCRIT: 27 % — AB (ref 36.0–46.0)
HEMOGLOBIN: 8.2 g/dL — AB (ref 12.0–15.0)
MCH: 24.2 pg — ABNORMAL LOW (ref 26.0–34.0)
MCHC: 30.4 g/dL (ref 30.0–36.0)
MCV: 79.6 fL (ref 78.0–100.0)
Platelets: 227 10*3/uL (ref 150–400)
RBC: 3.39 MIL/uL — AB (ref 3.87–5.11)
RDW: 16 % — ABNORMAL HIGH (ref 11.5–15.5)
WBC: 9.6 10*3/uL (ref 4.0–10.5)

## 2014-10-01 MED ORDER — OXYCODONE-ACETAMINOPHEN 5-325 MG PO TABS: 1.0000 | ORAL_TABLET | Freq: Four times a day (QID) | ORAL | Status: DC | PRN

## 2014-10-01 NOTE — Progress Notes (Signed)
Pt states feels pressure in head and left ear, a pulsating sensation.  Pt also states she feels swelling in left lip but none visibly.  VSS.  BP 143/69 hr 102 O2 sat 95% RA temp 98.8.  Pt states pressure radiates from top of head, left ear to clavicle.  Paged Redgie Grayer.  T/O percocet 1 tab 5/325  Pt Q6hrs prn for pain.  Lung sounds clear but diminished.  Auscultation of neck clear.  Dr. Hubert Azure to see pt shortly.  No further orders at this time.

## 2014-10-01 NOTE — Progress Notes (Signed)
Face symmetrical with no facial drooping.

## 2014-10-01 NOTE — Progress Notes (Signed)
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 She is s/p radiation therapy in Dec 2014. Has long standing h/o anemia. GI endoscopic eval has been negative. She is s/p breast reconstruction surgery in Jan 2016 for ruptured left breast implant. She was discharged to home on 1/7 w/ some limitation in mobility. Starting about 8 weeks prior to admit she started to note right groin and leg pain. Admitted on 4/8 w/ acute bilateral Pulmonary emboli and w/ evidence of right heart strain   SIGNIFICANT EVENTS  4/09>>>EKOS 4/10>> 1uRBC transfusion 4/12 increased periorbital ecchymosis  STUDIES:  4/08 CT chest >> Moderate burden of bilateral pulmonary emboli. Positive for acute PE with CT evidence of right heart strain (RV/LV Ratio = 1.9).  4/08 Echo >> EF 75%, mod RV dilation with decreased systolic fx, PAS 61 mmHg, mod/severe TR 4/09 LE dopplers >>> acute DVT in rt popliteal vein, rt TB veing, and rt peroneal vein   SUBJECTIVE:  Slept well overnight,increased periorbital ecchymosis.   VITAL SIGNS: Temp:  [98.4 F (36.9 C)-99.1 F (37.3 C)] 98.6 F (37 C) (04/12 0530) Pulse Rate:  [93-101] 93 (04/12 0530) Resp:  [16-18] 16 (04/12 0530) BP: (124-139)/(52-79) 124/76 mmHg (04/12 0530) SpO2:  [93 %-100 %] 93 % (04/12 0530) Weight:  [209 lb 3.5 oz (94.9 kg)] 209 lb 3.5 oz (94.9 kg) (04/11 1946) PHYSICAL EXAMINATION: General:  Elderly 74 year old female lying comfortably in bed Neuro:  Awake, alert, no focal def  Cardiovascular:  RRR, no murmurs Lungs: clear  on ant auscultation Abdomen:  Soft, non-tender  Musculoskeletal:  Intact  Skin: trace pedal edema  CBC Recent Labs     09/29/14  0355  09/30/14  0013  10/01/14  0600  WBC  8.5  8.3  9.6   HGB  6.9*  8.3*  8.2*  HCT  22.9*  26.6*  27.0*  PLT  210  201  227    Coag's No results for input(s): APTT, INR in the last 72 hours.  BMET Recent Labs     09/29/14  0355  NA  138  K  3.9  CL  108  CO2  17*  BUN  12  CREATININE  0.68  GLUCOSE  122*    Electrolytes Recent Labs     09/29/14  0355  CALCIUM  7.4*   Liver Enzymes No results for input(s): AST, ALT, ALKPHOS, BILITOT, ALBUMIN in the last 72 hours. Glucose No results for input(s): GLUCAP in the last 72 hours.  Imaging No results found.    ASSESSMENT / PLAN:  Acute submassive PE with RV strain 4/09 LE dopplers >>> acute DVT in rt popliteal vein, rt TB veing, and rt peroneal vein 4/09 EKOS lysis, 4/12 increase periorbital ecchymosis. Plan: - transition heparin gtt to xarelto per pharm. 4/12 dc Xarelto, pursue IVC . Once stable retreive IVC and restart Xarelto. Per MD  - guaifenesin/codeine q6h prn for cough   Hx of breast cancer >> stage I. Plan: - hold tamoxifen, will discuss w/ Dr. Beryle Beams risk for DVT w/ hx of malignancy. Pt was told she will be on life long tamoxifen, will likely continue xarelto indefinitely. Pt informed.  - her oncologist is Dr. Lindi Adie at [facility]  Anemia of chronic disease >> has extensive previous outpt w/u.  Recent Labs  09/30/14 0013 10/01/14 0600  HGB 8.3* 8.2*    4/10>> transfused 1uRBC for hgb of 6.9 4/10>> given IV feraheme 510mg  once Plan: - hbg 8.2 this morning, will continue to monitor - iron <10, ferritin normal, on iron supplements at home.    Hx of GERD. Plan: - protonix IV transitioned to pepcid 20mg  BID, on ranitidine at home  Hx of HTN. Plan: - vitals stable - can resume lisinopril as needed  [name] is a 74 y/o female w/ hx of breast cancer on tamoxifen who presented with an acute submassive PE that is now s/p EKOS and tPA, will cont hep gtt. Hemodynamically stable. Starting pt on xarelto, will f/u with IR as outpatient. Transfer to tele  4/-11.  4/12 increased periorbital ecchymosis on Xarelto for PE/DVT in setting of breast cancer. DC Xarelto and have IVC filter placed.  Richardson Landry Minor ACNP Maryanna Shape PCCM Pager [phone] till 3 pm If no answer page [phone]  I reviewed CT of the chest myself, submassive PE R>L noted.  Problem List - New and severe peri-orbital bleeding. - Submassive PE. - HTN. - Anemia. - Breast Cancer stage I. - GERD.  Plan: - Hold xeralto. - Place an IVC filter. - HTN treatment as above. - Resume lisinopril. - Continue Protonix. - Hold in tele.  Patient seen and examined, agree with above note.  I dictated the care and orders written for this patient under my direction.  Rush Farmer, MD [phone]  10/01/2014, 10:15 AM

## 2014-10-01 NOTE — Progress Notes (Signed)
Pt blew nose and small amount of blood was noted from both nares.

## 2014-10-01 NOTE — Care Management Note (Signed)
CARE MANAGEMENT NOTE 10/01/2014  Patient:  [facility]   Account Number:  [id number]  Date Initiated:  10/01/2014  Documentation initiated by:  Mechele Kittleson  Subjective/Objective Assessment:   CM following for progression and d/c planning.     Action/Plan:   Met with pt , no d/c needs identified at this time.   Anticipated DC Date:  10/03/2014   Anticipated DC Plan:  HOME/SELF CARE         Choice offered to / List presented to:             Status of service:  Completed, signed off Medicare Important Message given?  YES (If response is "NO", the following Medicare IM given date fields will be blank) Date Medicare IM given:  10/01/2014 Medicare IM given by:  Skai Lickteig Date Additional Medicare IM given:   Additional Medicare IM given by:    Discharge Disposition:    Per [facility] Regulation:    If discussed at Long Length of Stay Meetings, dates discussed:    Comments:

## 2014-10-01 NOTE — Progress Notes (Signed)
Referring Physician(s): Dr. Nelda Marseille  Subjective: 74 yo female with submassive PE and LE DVT Underwent successful PE lysis with good outcome. Placed on po Xarelto but having adverse effects including periorbital bleeding/ecchymosis. Plan is for Anticoagulation to be held. IR requested to place retrievable filter. Chart, imaging, labs reviewed.  Allergies: Review of patient's allergies indicates no known allergies.  Medications:  Current facility-administered medications:  .  benzonatate (TESSALON) capsule 100 mg, 100 mg, Oral, TID PRN, Colbert Coyer, MD, 100 mg at 09/29/14 2041 .  famotidine (PEPCID) tablet 20 mg, 20 mg, Oral, BID, Wilhelmina Mcardle, MD, 20 mg at 10/01/14 1029 .  fentaNYL (SUBLIMAZE) injection 25 mcg, 25 mcg, Intravenous, Q2H PRN, Chesley Mires, MD .  guaiFENesin-codeine 100-10 MG/5ML solution 5 mL, 5 mL, Oral, Q6H PRN, Norman Herrlich, MD, 5 mL at 09/30/14 2141 .  oxyCODONE-acetaminophen (PERCOCET/ROXICET) 5-325 MG per tablet 1 tablet, 1 tablet, Oral, Q6H PRN, Grace Bushy Minor, NP   Review of Systems  Vital Signs: BP 143/69 mmHg  Pulse 102  Temp(Src) 98.8 F (37.1 C) (Oral)  Resp 16  Ht 5\' 5"  (1.651 m)  Wt 209 lb 3.5 oz (94.9 kg)  BMI 34.82 kg/m2  SpO2 95%  Physical Exam Gen: NAD Neck: trachea midline, no JVD ENT: unremarkable airway, (B)periorbital ecchymosis noted Lungs: CTA without w/r/r Heart: Regular   Imaging: Ct Head Wo Contrast  09/28/2014   CLINICAL DATA:  Acute pulmonary embolism. No history of breast cancer.  EXAM: CT HEAD WITHOUT CONTRAST  TECHNIQUE: Contiguous axial images were obtained from the base of the skull through the vertex without intravenous contrast.  COMPARISON:  Head CT 12/27/2007  FINDINGS: All No acute intracranial hemorrhage. No focal mass lesion. No CT evidence of acute infarction. No midline shift or mass effect. No hydrocephalus. Basilar cisterns are patent. Paranasal sinuses and mastoid air cells are clear. Mild  periventricular white matter hypodensities.  IMPRESSION: No acute intracranial findings.  Mild white matter microvascular change.   Electronically Signed   By: Suzy Bouchard M.D.   On: 09/28/2014 11:09   Ir Angiogram Pulmonary Bilateral Selective  09/29/2014   CLINICAL DATA:  Pulmonary thromboembolism.  Right heart strain.  EXAM: BILATERAL PULMONARY ARTERIOGRAPHY; ADDITIONAL ARTERIOGRAPHY; IR ULTRASOUND GUIDANCE VASC ACCESS RIGHT; IR INFUSION THROMBOL ARTERIAL INITIAL (MS)  FLUOROSCOPY TIME:  6 minutes 24 seconds.  MEDICATIONS AND MEDICAL HISTORY: Versed 1 mg, Fentanyl 50 mcg.  Additional Medications: None.  ANESTHESIA/SEDATION: Moderate sedation time: 30 minutes  CONTRAST:  50 cc Omnipaque 300  PROCEDURE: The procedure, risks, benefits, and alternatives were explained to the patient. Questions regarding the procedure were encouraged and answered. The patient understands and consents to the procedure.  The right groin was prepped with Betadine in a sterile fashion, and a sterile drape was applied covering the operative field. A sterile gown and sterile gloves were used for the procedure.  Under sonographic guidance, a micropuncture needle was placed into the right common femoral vein and removed over a 018 wire which was up sized to a Bentson. A 7 French sheath was inserted. The identical procedure was performed in the same vein within additional 7 French sheath. A JB 1 catheter was advanced over a Bentson wire into the left pulmonary artery. Pressure and angiography was performed. Left pulmonary artery pressure was measured at 56/23 with a mean of 36 mm Hg. The catheter was removed over a longer Rosen wire. The identical procedure was performed into the right pulmonary artery through the other sheath.  Right pulmonary artery pressure was measured at 50/23 with a mean of 31 mm Hg. Right and left 18 and 12 cm EKOS infusion catheters were then advanced into the right and left pulmonary arteries. TPA infusion was  instituted.  FINDINGS: Imaging confirms right and left pulmonary artery catheter position in the descending branches. Contrast injected confirms positioning and pulmonary thromboembolism.  COMPLICATIONS: None  IMPRESSION: Successful he close bilateral pulmonary artery thrombolytic infusion.   Electronically Signed   By: Marybelle Killings M.D.   On: 09/29/2014 08:46   Ir Angiogram Selective Each Additional Vessel  09/29/2014   CLINICAL DATA:  Pulmonary thromboembolism.  Right heart strain.  EXAM: BILATERAL PULMONARY ARTERIOGRAPHY; ADDITIONAL ARTERIOGRAPHY; IR ULTRASOUND GUIDANCE VASC ACCESS RIGHT; IR INFUSION THROMBOL ARTERIAL INITIAL (MS)  FLUOROSCOPY TIME:  6 minutes 24 seconds.  MEDICATIONS AND MEDICAL HISTORY: Versed 1 mg, Fentanyl 50 mcg.  Additional Medications: None.  ANESTHESIA/SEDATION: Moderate sedation time: 30 minutes  CONTRAST:  50 cc Omnipaque 300  PROCEDURE: The procedure, risks, benefits, and alternatives were explained to the patient. Questions regarding the procedure were encouraged and answered. The patient understands and consents to the procedure.  The right groin was prepped with Betadine in a sterile fashion, and a sterile drape was applied covering the operative field. A sterile gown and sterile gloves were used for the procedure.  Under sonographic guidance, a micropuncture needle was placed into the right common femoral vein and removed over a 018 wire which was up sized to a Bentson. A 7 French sheath was inserted. The identical procedure was performed in the same vein within additional 7 French sheath. A JB 1 catheter was advanced over a Bentson wire into the left pulmonary artery. Pressure and angiography was performed. Left pulmonary artery pressure was measured at 56/23 with a mean of 36 mm Hg. The catheter was removed over a longer Rosen wire. The identical procedure was performed into the right pulmonary artery through the other sheath. Right pulmonary artery pressure was measured at  50/23 with a mean of 31 mm Hg. Right and left 18 and 12 cm EKOS infusion catheters were then advanced into the right and left pulmonary arteries. TPA infusion was instituted.  FINDINGS: Imaging confirms right and left pulmonary artery catheter position in the descending branches. Contrast injected confirms positioning and pulmonary thromboembolism.  COMPLICATIONS: None  IMPRESSION: Successful he close bilateral pulmonary artery thrombolytic infusion.   Electronically Signed   By: Marybelle Killings M.D.   On: 09/29/2014 08:46   Ir Angiogram Selective Each Additional Vessel  09/29/2014   CLINICAL DATA:  Pulmonary thromboembolism.  Right heart strain.  EXAM: BILATERAL PULMONARY ARTERIOGRAPHY; ADDITIONAL ARTERIOGRAPHY; IR ULTRASOUND GUIDANCE VASC ACCESS RIGHT; IR INFUSION THROMBOL ARTERIAL INITIAL (MS)  FLUOROSCOPY TIME:  6 minutes 24 seconds.  MEDICATIONS AND MEDICAL HISTORY: Versed 1 mg, Fentanyl 50 mcg.  Additional Medications: None.  ANESTHESIA/SEDATION: Moderate sedation time: 30 minutes  CONTRAST:  50 cc Omnipaque 300  PROCEDURE: The procedure, risks, benefits, and alternatives were explained to the patient. Questions regarding the procedure were encouraged and answered. The patient understands and consents to the procedure.  The right groin was prepped with Betadine in a sterile fashion, and a sterile drape was applied covering the operative field. A sterile gown and sterile gloves were used for the procedure.  Under sonographic guidance, a micropuncture needle was placed into the right common femoral vein and removed over a 018 wire which was up sized to a Bentson. A 7 French sheath was inserted.  The identical procedure was performed in the same vein within additional 7 French sheath. A JB 1 catheter was advanced over a Bentson wire into the left pulmonary artery. Pressure and angiography was performed. Left pulmonary artery pressure was measured at 56/23 with a mean of 36 mm Hg. The catheter was removed over a  longer Rosen wire. The identical procedure was performed into the right pulmonary artery through the other sheath. Right pulmonary artery pressure was measured at 50/23 with a mean of 31 mm Hg. Right and left 18 and 12 cm EKOS infusion catheters were then advanced into the right and left pulmonary arteries. TPA infusion was instituted.  FINDINGS: Imaging confirms right and left pulmonary artery catheter position in the descending branches. Contrast injected confirms positioning and pulmonary thromboembolism.  COMPLICATIONS: None  IMPRESSION: Successful he close bilateral pulmonary artery thrombolytic infusion.   Electronically Signed   By: Marybelle Killings M.D.   On: 09/29/2014 08:46   Ir US Guide Vasc Access Right  09/29/2014   CLINICAL DATA:  Pulmonary thromboembolism.  Right heart strain.  EXAM: BILATERAL PULMONARY ARTERIOGRAPHY; ADDITIONAL ARTERIOGRAPHY; IR ULTRASOUND GUIDANCE VASC ACCESS RIGHT; IR INFUSION THROMBOL ARTERIAL INITIAL (MS)  FLUOROSCOPY TIME:  6 minutes 24 seconds.  MEDICATIONS AND MEDICAL HISTORY: Versed 1 mg, Fentanyl 50 mcg.  Additional Medications: None.  ANESTHESIA/SEDATION: Moderate sedation time: 30 minutes  CONTRAST:  50 cc Omnipaque 300  PROCEDURE: The procedure, risks, benefits, and alternatives were explained to the patient. Questions regarding the procedure were encouraged and answered. The patient understands and consents to the procedure.  The right groin was prepped with Betadine in a sterile fashion, and a sterile drape was applied covering the operative field. A sterile gown and sterile gloves were used for the procedure.  Under sonographic guidance, a micropuncture needle was placed into the right common femoral vein and removed over a 018 wire which was up sized to a Bentson. A 7 French sheath was inserted. The identical procedure was performed in the same vein within additional 7 French sheath. A JB 1 catheter was advanced over a Bentson wire into the left pulmonary artery.  Pressure and angiography was performed. Left pulmonary artery pressure was measured at 56/23 with a mean of 36 mm Hg. The catheter was removed over a longer Rosen wire. The identical procedure was performed into the right pulmonary artery through the other sheath. Right pulmonary artery pressure was measured at 50/23 with a mean of 31 mm Hg. Right and left 18 and 12 cm EKOS infusion catheters were then advanced into the right and left pulmonary arteries. TPA infusion was instituted.  FINDINGS: Imaging confirms right and left pulmonary artery catheter position in the descending branches. Contrast injected confirms positioning and pulmonary thromboembolism.  COMPLICATIONS: None  IMPRESSION: Successful he close bilateral pulmonary artery thrombolytic infusion.   Electronically Signed   By: Marybelle Killings M.D.   On: 09/29/2014 08:46   Ir Infusion Thrombol Arterial Initial (ms)  09/29/2014   CLINICAL DATA:  Pulmonary thromboembolism.  Right heart strain.  EXAM: BILATERAL PULMONARY ARTERIOGRAPHY; ADDITIONAL ARTERIOGRAPHY; IR ULTRASOUND GUIDANCE VASC ACCESS RIGHT; IR INFUSION THROMBOL ARTERIAL INITIAL (MS)  FLUOROSCOPY TIME:  6 minutes 24 seconds.  MEDICATIONS AND MEDICAL HISTORY: Versed 1 mg, Fentanyl 50 mcg.  Additional Medications: None.  ANESTHESIA/SEDATION: Moderate sedation time: 30 minutes  CONTRAST:  50 cc Omnipaque 300  PROCEDURE: The procedure, risks, benefits, and alternatives were explained to the patient. Questions regarding the procedure were encouraged and answered. The patient understands  and consents to the procedure.  The right groin was prepped with Betadine in a sterile fashion, and a sterile drape was applied covering the operative field. A sterile gown and sterile gloves were used for the procedure.  Under sonographic guidance, a micropuncture needle was placed into the right common femoral vein and removed over a 018 wire which was up sized to a Bentson. A 7 French sheath was inserted. The identical  procedure was performed in the same vein within additional 7 French sheath. A JB 1 catheter was advanced over a Bentson wire into the left pulmonary artery. Pressure and angiography was performed. Left pulmonary artery pressure was measured at 56/23 with a mean of 36 mm Hg. The catheter was removed over a longer Rosen wire. The identical procedure was performed into the right pulmonary artery through the other sheath. Right pulmonary artery pressure was measured at 50/23 with a mean of 31 mm Hg. Right and left 18 and 12 cm EKOS infusion catheters were then advanced into the right and left pulmonary arteries. TPA infusion was instituted.  FINDINGS: Imaging confirms right and left pulmonary artery catheter position in the descending branches. Contrast injected confirms positioning and pulmonary thromboembolism.  COMPLICATIONS: None  IMPRESSION: Successful he close bilateral pulmonary artery thrombolytic infusion.   Electronically Signed   By: Marybelle Killings M.D.   On: 09/29/2014 08:46   Ir Jacolyn Reedy F/u Eval Art/ven Final Day (ms)  09/29/2014   CLINICAL DATA:  Follow-up EKOS pulmonary artery thrombolytic therapy.  EXAM: IR THROMB F/U EVAL ART/VEN FINAL DAY  FLUOROSCOPY TIME:  20 seconds.  MEDICATIONS AND MEDICAL HISTORY: None  ANESTHESIA/SEDATION: None  CONTRAST:  None  PROCEDURE: The procedure, risks, benefits, and alternatives were explained to the patient. Questions regarding the procedure were encouraged and answered. The patient understands and consents to the procedure.  Right and left pulmonary artery pressures were obtained. Right pulmonary artery pressure is 35/27 with a mean of 31 mm Hg. That of the left is 34/29 with a mean of 31 mm Hg. The catheters and sheaths were removed. Hemostasis was achieved with direct pressure and VPAD.  FINDINGS: Fluoroscopic imaging confirms stable position of the right and left pulmonary artery EKOS catheters.  COMPLICATIONS: None  IMPRESSION: Successful conclusion of bilateral  pulmonary artery EKOS thrombo lytic therapy. Peak systolic pressures have significantly improved from 56 to 35 mm Hg in the right pulmonary artery and 50 to 34 mm Hg in the left pulmonary artery.   Electronically Signed   By: Marybelle Killings M.D.   On: 09/29/2014 09:21    Labs:  CBC:  Recent Labs  09/28/14 2200 09/29/14 0355 09/30/14 0013 10/01/14 0600  WBC 10.1 8.5 8.3 9.6  HGB 7.1* 6.9* 8.3* 8.2*  HCT 23.8* 22.9* 26.6* 27.0*  PLT 201 210 201 227    COAGS:  Recent Labs  09/27/14 1457  INR 1.08  APTT 28    BMP:  Recent Labs  06/20/14 1200 09/27/14 1046 09/28/14 0304 09/29/14 0355  NA 139 140 140 138  K 3.9 4.0 4.1 3.9  CL 102 107 111 108  CO2 28 23 22  17*  GLUCOSE 96 153* 138* 122*  BUN 12 16 17 12   CALCIUM 9.4 8.5 8.1* 7.4*  CREATININE 0.73 1.06 0.98 0.68  GFRNONAA 83* 51* 56* 85*  GFRAA >90 59* 65* >90    LIVER FUNCTION TESTS:  Recent Labs  11/01/13 1335 05/08/14 1417 09/27/14 1046  BILITOT 0.39 0.44 0.5  AST 25 18 34  ALT 22 15 23   ALKPHOS 85 57 61  PROT 7.2 6.6 5.6*  ALBUMIN 3.9 3.7 3.0*    Assessment and Plan: Submassive PE, LE DVT Not tolerating po anticoagulation at this time. Plan for retrievable IVC filter. Risks and Benefits discussed with the patient including, but not limited to bleeding, infection, contrast induced renal failure, filter fracture or migration which can lead to emergency surgery or even death, strut penetration with damage or irritation to adjacent structures and caval thrombosis. All of the patient's questions were answered, patient is agreeable to proceed. Consent signed and in chart.     I spent a total of 15 minutes face to face in clinical consultation/evaluation, greater than 50% of which was counseling/coordinating care for IVC filter placement  Signed: Ascencion Dike 10/01/2014, 2:08 PM

## 2014-10-02 ENCOUNTER — Inpatient Hospital Stay (HOSPITAL_COMMUNITY): Payer: Medicare Other

## 2014-10-02 DIAGNOSIS — I82401 Acute embolism and thrombosis of unspecified deep veins of right lower extremity: Secondary | ICD-10-CM

## 2014-10-02 LAB — BASIC METABOLIC PANEL
Anion gap: 9 (ref 5–15)
BUN: 10 mg/dL (ref 6–23)
CO2: 25 mmol/L (ref 19–32)
Calcium: 8.3 mg/dL — ABNORMAL LOW (ref 8.4–10.5)
Chloride: 108 mmol/L (ref 96–112)
Creatinine, Ser: 0.61 mg/dL (ref 0.50–1.10)
GFR calc Af Amer: >90 mL/min (ref >90)
GFR calc non Af Amer: 88 mL/min — ABNORMAL LOW (ref >90)
Glucose, Bld: 98 mg/dL (ref 70–99)
Potassium: 3.3 mmol/L — ABNORMAL LOW (ref 3.5–5.1)
Sodium: 142 mmol/L (ref 135–145)

## 2014-10-02 LAB — PROTIME-INR
INR: 1.13 (ref 0.00–1.49)
Prothrombin Time: 14.7 seconds (ref 11.6–15.2)

## 2014-10-02 LAB — APTT: aPTT: 29 seconds (ref 24–37)

## 2014-10-02 MED ORDER — MIDAZOLAM HCL 2 MG/2ML IJ SOLN
INTRAMUSCULAR | Status: AC | PRN
  Administered 2014-10-02: 1 mg via INTRAVENOUS

## 2014-10-02 MED ORDER — FENTANYL CITRATE 0.05 MG/ML IJ SOLN
INTRAMUSCULAR | Status: AC
  Filled 2014-10-02: qty 2

## 2014-10-02 MED ORDER — MIDAZOLAM HCL 2 MG/2ML IJ SOLN
INTRAMUSCULAR | Status: AC
  Filled 2014-10-02: qty 2

## 2014-10-02 MED ORDER — LIDOCAINE HCL 1 % IJ SOLN
INTRAMUSCULAR | Status: AC
  Filled 2014-10-02: qty 20

## 2014-10-02 MED ORDER — IOHEXOL 300 MG/ML  SOLN
100.0000 mL | Freq: Once | INTRAMUSCULAR | Status: AC | PRN
  Administered 2014-10-02: 50 mL via INTRAVENOUS

## 2014-10-02 MED ORDER — FENTANYL CITRATE 0.05 MG/ML IJ SOLN
INTRAMUSCULAR | Status: AC | PRN
  Administered 2014-10-02: 50 ug via INTRAVENOUS

## 2014-10-02 NOTE — Procedures (Signed)
IVC filter [place] No comp

## 2014-10-02 NOTE — Progress Notes (Signed)
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 She is s/p radiation therapy in Dec 2014. Has long standing h/o anemia. GI endoscopic eval has been negative. She is s/p breast reconstruction surgery in Jan 2016 for ruptured left breast implant. She was discharged to home on 1/7 w/ some limitation in mobility. Starting about 8 weeks prior to admit she started to note right groin and leg pain. Admitted on 4/8 w/ acute bilateral Pulmonary emboli and w/ evidence of right heart strain   SIGNIFICANT EVENTS  4/09>>>EKOS 4/10>> 1uRBC transfusion 4/12 increased periorbital ecchymosis 4/13 for IVC filter>>  STUDIES:  4/08 CT chest >> Moderate burden of bilateral pulmonary emboli. Positive for acute PE with CT evidence of right heart strain (RV/LV Ratio = 1.9).  4/08 Echo >> EF 75%, mod RV dilation with decreased systolic fx, PAS 61 mmHg, mod/severe TR 4/09 LE dopplers >>> acute DVT in rt popliteal vein, rt TB veing, and rt peroneal vein   SUBJECTIVE:  Complains of left neck pain.  VITAL SIGNS: Temp:  [98.7 F (37.1 C)-98.9 F (37.2 C)] 98.7 F (37.1 C) (04/13 0527) Pulse Rate:  [83-102] 83 (04/13 0527) Resp:  [18] 18 (04/13 0527) BP: (130-143)/(64-81) 133/64 mmHg (04/13 0527) SpO2:  [95 %-98 %] 97 % (04/13 0527) Weight:  [210 lb 1.6 oz (95.3 kg)] 210 lb 1.6 oz (95.3 kg) (04/12 2141) PHYSICAL EXAMINATION: General:  Elderly 74 year old female lying comfortably in bed Neuro:  Awake, alert, no focal def  HEENT: Periorbital ecchymosis may be increased on left, PERRL, EOM-I and MMM Cardiovascular:  RRR, no murmurs Lungs: clear  on ant auscultation Abdomen:  Soft, non-tender  Musculoskeletal:  Intact  Skin: trace pedal edema  CBC Recent Labs    09/30/14  0013  10/01/14  0600  WBC  8.3  9.6  HGB  8.3*  8.2*  HCT  26.6*  27.0*  PLT  201  227    Coag's Recent Labs     10/02/14  0422  APTT  29  INR  1.13    BMET Recent Labs     10/02/14  0422  NA  142  K  3.3*  CL  108  CO2  25  BUN  10  CREATININE  0.61  GLUCOSE  98    Electrolytes Recent Labs     10/02/14  0422  CALCIUM  8.3*   Liver Enzymes No results for input(s): AST, ALT, ALKPHOS, BILITOT, ALBUMIN in the last 72 hours. Glucose No results for input(s): GLUCAP in the last 72 hours.  Imaging No results found.    ASSESSMENT / PLAN:  Acute submassive PE with RV strain 4/09 LE dopplers >>> acute DVT in rt popliteal vein, rt TB veing, and rt peroneal vein 4/09 EKOS lysis, 4/12 increase periorbital ecchymosis. Plan: - transition heparin gtt to xarelto per pharm. 4/12 dc Xarelto, pursue IVC . Once stable retreive IVC and restart Xarelto.  For IVC 4/13 - guaifenesin/codeine q6h prn for cough   Hx of breast cancer >> stage I. Plan: - hold tamoxifen, will discuss w/ Dr. Beryle Beams risk for DVT w/ hx of malignancy. Pt was told she will be on life long tamoxifen, will likely continue xarelto indefinitely. Pt  informed.  - her oncologist is Dr. Lindi Adie at [facility]  Anemia of chronic disease >> has extensive previous outpt w/u.  Recent Labs  09/30/14 0013 10/01/14 0600  HGB 8.3* 8.2*    4/10>> transfused 1uRBC for hgb of 6.9 4/10>> given IV feraheme 510mg  once Plan: - continue to monitor - iron <10, ferritin normal, on iron supplements at home.    Hx of GERD. Plan: - protonix IV transitioned to pepcid 20mg  BID, on ranitidine at home  Hx of HTN. Plan: - vitals stable - can resume lisinopril as needed  [name] is a 74 y/o female w/ hx of breast cancer on tamoxifen who presented with an acute submassive PE. Failed Xarelto due to increased periorbital ecchymosis and is scheduled for IVC filter 4/13. Plan to DC soon and follow up for life long  anticoagulation in future.   Richardson Landry Minor ACNP Maryanna Shape PCCM Pager [phone] till 3 pm If no answer page [phone]  74 year old female with acute PE who underwent the EKOS protocol.  Was started on an oral anti-coagulant and had periocular bleeding.  Today periocular bleeding stable on exam.  Acute and stable pulmonary embolism: supplement O2 as needed.  Will not be able to anti-coagulate will send patient for a IVC filter today since ocular bleeding incident yesterday. Acute and stable R lower ext DVT: no anticoagulation, place IVC filter. Stage 1 breast cancer: start tamoxifen Anemia of chronic disease: transfuse and give feraheme as ordered. Chronic GERD: Protonix, transition from IV to PO. Chronic HTN: resume lisinopril for BP control.  Patient seen and examined, agree with above note.  I dictated the care and orders written for this patient under my direction.  Rush Farmer, MD [phone]

## 2014-10-03 DIAGNOSIS — I82409 Acute embolism and thrombosis of unspecified deep veins of unspecified lower extremity: Secondary | ICD-10-CM

## 2014-10-03 LAB — CBC
HEMATOCRIT: 29.6 % — AB (ref 36.0–46.0)
Hemoglobin: 8.9 g/dL — ABNORMAL LOW (ref 12.0–15.0)
MCH: 25.1 pg — ABNORMAL LOW (ref 26.0–34.0)
MCHC: 30.1 g/dL (ref 30.0–36.0)
MCV: 83.4 fL (ref 78.0–100.0)
Platelets: 247 10*3/uL (ref 150–400)
RBC: 3.55 MIL/uL — AB (ref 3.87–5.11)
RDW: 19.3 % — ABNORMAL HIGH (ref 11.5–15.5)
WBC: 7.8 10*3/uL (ref 4.0–10.5)

## 2014-10-03 LAB — BASIC METABOLIC PANEL
Anion gap: 11 (ref 5–15)
BUN: 7 mg/dL (ref 6–23)
CO2: 25 mmol/L (ref 19–32)
CREATININE: 0.61 mg/dL (ref 0.50–1.10)
Calcium: 8.3 mg/dL — ABNORMAL LOW (ref 8.4–10.5)
Chloride: 106 mmol/L (ref 96–112)
GFR calc Af Amer: >90 mL/min (ref >90)
GFR calc non Af Amer: 88 mL/min — ABNORMAL LOW (ref >90)
Glucose, Bld: 93 mg/dL (ref 70–99)
POTASSIUM: 3.5 mmol/L (ref 3.5–5.1)
Sodium: 142 mmol/L (ref 135–145)

## 2014-10-03 NOTE — Progress Notes (Signed)
Patient Discharge:  Disposition: Pt discharged home with husband.  Education: Pt educated on medications, follow up appointments and all discharge instructions. Pt given handouts on diagnosis. Pt verbalized understanding.   IV: Removed  Telemetry: Removed. CCMD notified  Follow-up appointments: Reviewed with pt.  Prescriptions: N/A  Transportation: Transported home by husband  Belongings:All belongings taken with pt.

## 2014-10-03 NOTE — Discharge Summary (Signed)
Physician Discharge Summary  Patient ID: [name] MRN: [id number] DOB/AGE: May 24, 1941 74 y.o.  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 She is s/p radiation therapy in Dec 2014. Has long standing h/o anemia. GI endoscopic eval has been negative. She is s/p breast reconstruction surgery in Jan 2016 for ruptured left breast implant. She was discharged to home on 1/7 w/ some limitation in mobility. Starting about 8 weeks prior to admit she started to note right groin and leg pain. She spoke to her primary about this and they felt that she needed Physical therapy. The pain was persistent and about 4 weeks ago had some transient swelling. During this time she began to note increased shortness of breath and decreased activity tolerance. About three weeks ago this was felt to be bronchitis and she was advised to treat with OTCs. Over the 2 d prior to admit she had recurrent episodes of light-headedness after standing. On afternoon of 4/8 she had a syncopal episode when going to the bathroom. Dx eval in ER demonstrated bilateral PE w/ evidence of right heart strain. PCCM was asked to admit   Hospital Course:  SIGNIFICANT EVENTS  4/09>>>EKOS 4/10>> 1uRBC transfusion 4/12 increased periorbital ecchymosis    4/13 for IVC filter>>  STUDIES:  4/08 CT chest >> Moderate burden of bilateral pulmonary emboli. Positive for acute PE with CT evidence of right heart strain (RV/LV Ratio = 1.9).  4/08 Echo >> EF 75%, mod RV dilation with decreased systolic fx, PAS 61 mmHg, mod/severe TR 4/09 LE dopplers >>> acute DVT in rt popliteal vein, rt TB veing, and rt peroneal vein   ASSESSMENT / PLAN:  Acute submassive PE with RV strain 4/09 LE dopplers >>> acute DVT in rt popliteal vein, rt TB veing, and rt peroneal vein 4/09 EKOS lysis, 4/12  increase periorbital ecchymosis. Plan: - transition heparin gtt to xarelto per pharm. 4/12 dc Xarelto,  IVC placed 4/13 . Once stable retreive IVC and restart Xarelto.  Follow up with Dr. Halford Chessman. Will need to decide when to resume Tamoxifen.  Hx of breast cancer >> stage I. Plan: - hold tamoxifen, will discuss w/ Dr. Beryle Beams risk for DVT w/ hx of malignancy. Pt was told she will be on life long tamoxifen, will likely continue xarelto indefinitely. Pt informed.  - her oncologist is [facility]i [facility] at [facility]  Anemia of chronic disease >> has extensive previous outpt w/u.  Recent Labs (last 2 labs)      Recent Labs  09/30/14 0013 10/01/14 0600  HGB 8.3* 8.2*      4/10>> transfused 1uRBC for hgb of 6.9 4/10>> given IV feraheme 510mg  once Plan: - continue to monitor - iron <10, ferritin normal, on iron supplements at home.   Hx of GERD. Plan: - protonix IV transitioned to pepcid 20mg  BID, on ranitidine at home  Hx of HTN. Plan: - vitals stable -  resume lisinopril as needed  [name] is a 74 y/o female w/ hx of breast cancer on tamoxifen who presented with an acute submassive PE. Failed Xarelto due to increased periorbital ecchymosis and had  IVC filter  Placed 4/13.  DC 4/14 and she will ned to follow up with Dr Halford Chessman.          Labs at discharge Lab Results  Component Value Date  CREATININE 0.61 10/03/2014   BUN 7 10/03/2014   NA 142 10/03/2014   K 3.5 10/03/2014   CL 106 10/03/2014   CO2 25 10/03/2014   Lab Results  Component Value Date   WBC 7.8 10/03/2014   HGB 8.9* 10/03/2014   HCT 29.6* 10/03/2014   MCV 83.4 10/03/2014   PLT 247 10/03/2014   Lab Results  Component Value Date   ALT 23 09/27/2014   AST 34 09/27/2014   ALKPHOS 61 09/27/2014   BILITOT 0.5 09/27/2014   Lab Results  Component Value Date   INR 1.13 10/02/2014   INR 1.08 09/27/2014    Current radiology studies Ir Ivc Filter Plmt / S&i /img Guid/mod Sed  10/02/2014   CLINICAL  DATA:  Pulmonary thromboembolism. Peri-orbitall hemorrhage after anti coagulation.  EXAM: IVC FILTER,INFERIOR VENA CAVOGRAM  FLUOROSCOPY TIME:  1 minutes and 12 seconds.  MEDICATIONS AND MEDICAL HISTORY: Versed 1 mg, Fentanyl 50 mcg.  Additional Medications: None.  ANESTHESIA/SEDATION: Moderate sedation time: 15 minutes  CONTRAST:  40 cc Omnipaque 300  PROCEDURE: The procedure, risks, benefits, and alternatives were explained to the patient. Questions regarding the procedure were encouraged and answered. The patient understands and consents to the procedure.  The right neck was prepped with Betadine in a sterile fashion, and a sterile drape was applied covering the operative field. A sterile gown and sterile gloves were used for the procedure.  The Betadine vein was noted to be patent initially with ultrasound. Under sonographic guidance, a micropuncture needle was inserted into the right internal jugular vein (Ultrasound image documentation was performed). It was removed over an 018 wire which was upsized to a Singac. The sheath was inserted over the wire and into the IVC. IVC venography was performed.  The temporary filter was then deployed in the infrarenal IVC. The sheath was removed and hemostasis was achieved with direct pressure.  FINDINGS: IVC venography confirms renal vein inflow at L1. No IVC thrombus or venous anomaly.  The image demonstrates placement of an IVC filter with its tip at the L1-2 disc.  COMPLICATIONS: None  IMPRESSION: Successful infrarenal IVC filter placement. This is a temporary filter. It can be removed or remain in place to become permanent.   Electronically Signed   By: Marybelle Killings M.D.   On: 10/02/2014 13:21    Disposition:  01-Home or Self Care     Medication List    STOP taking these medications        tamoxifen 20 MG tablet  Commonly known as:  NOLVADEX      TAKE these medications        BIOTIN PO  Take 1 tablet by mouth every morning.     CALCIUM PO  Take 1  capsule by mouth 2 (two) times daily. 1500mg  twice a day, chews.     Vitamin D3 5000 UNITS Tabs  Take 5,000 Units by mouth daily.     cholecalciferol 1000 UNITS tablet  Commonly known as:  VITAMIN D  Take 1,000 Units by mouth daily. D#3 type     Fish Oil 1000 MG Caps  Take 1 capsule by mouth 2 (two) times daily.     iron polysaccharides 150 MG capsule  Commonly known as:  NIFEREX  Take 150 mg by mouth daily. Pt takes medication once a week.     lisinopril 20 MG tablet  Commonly known as:  PRINIVIL,ZESTRIL  Take 20 mg by mouth daily.     multivitamin tablet  Take  1 tablet by mouth daily. gummies     non-metallic deodorant Misc  Commonly known as:  ALRA  Apply 1 application topically daily as needed.     PHILLIPS COLON HEALTH PO  Take 1 capsule by mouth daily.     ranitidine 150 MG tablet  Commonly known as:  ZANTAC  Take 1 tablet (150 mg total) by mouth at bedtime.     vitamin C 100 MG tablet  Take 100 mg by mouth daily.     vitamin E 100 UNIT capsule  Take 100 Units by mouth daily.          Discharged Condition: fair  Time spent on discharge greater than 40 minutes.  Vital signs at Discharge. Temp:  [97.9 F (36.6 C)-98.7 F (37.1 C)] 98.7 F (37.1 C) (04/14 1000) Pulse Rate:  [80-92] 90 (04/14 1000) Resp:  [17-18] 18 (04/14 1000) BP: (136-152)/(59-83) 152/69 mmHg (04/14 1000) SpO2:  [96 %-98 %] 97 % (04/14 1000) Weight:  [210 lb 12.2 oz (95.6 kg)] 210 lb 12.2 oz (95.6 kg) (04/13 2152) Office follow up Special Information or instructions.  She will see Dr. Halford Chessman on 4/28 @12  noon. Question when to start back on Tamoxifen. She has an IVC filter in place but would presume she needs anticoagulation prior to resuming cancer medication. Onocologist Dr. Lindi Adie. Copies of DC summary sent to Dr.s Lindi Adie and Kelton Pillar. Signed: Richardson Landry Minor ACNP Maryanna Shape PCCM Pager [phone] till 3 pm If no answer page [phone] 10/03/2014, 11:06 AM  Patient seen and  examined, agree with above note.  I dictated the care and orders written for this patient under my direction.  Rush Farmer, MD [phone]

## 2014-10-08 ENCOUNTER — Other Ambulatory Visit: Payer: Self-pay

## 2014-10-09 ENCOUNTER — Telehealth: Payer: Self-pay | Admitting: Hematology and Oncology

## 2014-10-09 NOTE — Telephone Encounter (Signed)
Called and left a message with new appointment time

## 2014-10-10 ENCOUNTER — Inpatient Hospital Stay (HOSPITAL_COMMUNITY)
Admission: EM | Admit: 2014-10-10 | Discharge: 2014-10-26 | DRG: 315 | Disposition: A | Discharge status: Home / Self Care | Payer: Medicare Other | Attending: Internal Medicine | Admitting: Internal Medicine

## 2014-10-10 ENCOUNTER — Encounter (HOSPITAL_COMMUNITY): Payer: Self-pay | Admitting: Emergency Medicine

## 2014-10-10 ENCOUNTER — Emergency Department (HOSPITAL_COMMUNITY): Payer: Medicare Other

## 2014-10-10 ENCOUNTER — Telehealth: Payer: Self-pay | Admitting: Pulmonary Disease

## 2014-10-10 DIAGNOSIS — E860 Dehydration: Secondary | ICD-10-CM | POA: Diagnosis present

## 2014-10-10 DIAGNOSIS — I2699 Other pulmonary embolism without acute cor pulmonale: Secondary | ICD-10-CM

## 2014-10-10 DIAGNOSIS — I82409 Acute embolism and thrombosis of unspecified deep veins of unspecified lower extremity: Secondary | ICD-10-CM | POA: Diagnosis not present

## 2014-10-10 DIAGNOSIS — K2100 Gastro-esophageal reflux disease with esophagitis, without bleeding: Secondary | ICD-10-CM

## 2014-10-10 DIAGNOSIS — Z853 Personal history of malignant neoplasm of breast: Secondary | ICD-10-CM | POA: Diagnosis not present

## 2014-10-10 DIAGNOSIS — N179 Acute kidney failure, unspecified: Secondary | ICD-10-CM | POA: Diagnosis present

## 2014-10-10 DIAGNOSIS — C50211 Malignant neoplasm of upper-inner quadrant of right female breast: Secondary | ICD-10-CM | POA: Diagnosis not present

## 2014-10-10 DIAGNOSIS — R000 Tachycardia, unspecified: Secondary | ICD-10-CM

## 2014-10-10 DIAGNOSIS — R55 Syncope and collapse: Secondary | ICD-10-CM | POA: Diagnosis present

## 2014-10-10 DIAGNOSIS — R609 Edema, unspecified: Secondary | ICD-10-CM | POA: Diagnosis not present

## 2014-10-10 DIAGNOSIS — E7800 Pure hypercholesterolemia, unspecified: Secondary | ICD-10-CM | POA: Diagnosis present

## 2014-10-10 DIAGNOSIS — Z95828 Presence of other vascular implants and grafts: Secondary | ICD-10-CM

## 2014-10-10 DIAGNOSIS — M797 Fibromyalgia: Secondary | ICD-10-CM | POA: Diagnosis present

## 2014-10-10 DIAGNOSIS — Z923 Personal history of irradiation: Secondary | ICD-10-CM | POA: Diagnosis not present

## 2014-10-10 DIAGNOSIS — C4361 Malignant melanoma of right upper limb, including shoulder: Secondary | ICD-10-CM | POA: Diagnosis present

## 2014-10-10 DIAGNOSIS — E559 Vitamin D deficiency, unspecified: Secondary | ICD-10-CM | POA: Diagnosis present

## 2014-10-10 DIAGNOSIS — D638 Anemia in other chronic diseases classified elsewhere: Secondary | ICD-10-CM | POA: Diagnosis present

## 2014-10-10 DIAGNOSIS — D509 Iron deficiency anemia, unspecified: Secondary | ICD-10-CM | POA: Diagnosis present

## 2014-10-10 DIAGNOSIS — D62 Acute posthemorrhagic anemia: Secondary | ICD-10-CM | POA: Diagnosis present

## 2014-10-10 DIAGNOSIS — Z86718 Personal history of other venous thrombosis and embolism: Secondary | ICD-10-CM | POA: Diagnosis not present

## 2014-10-10 DIAGNOSIS — E876 Hypokalemia: Secondary | ICD-10-CM | POA: Diagnosis not present

## 2014-10-10 DIAGNOSIS — J9811 Atelectasis: Secondary | ICD-10-CM | POA: Diagnosis not present

## 2014-10-10 DIAGNOSIS — R42 Dizziness and giddiness: Secondary | ICD-10-CM | POA: Diagnosis not present

## 2014-10-10 DIAGNOSIS — I8290 Acute embolism and thrombosis of unspecified vein: Secondary | ICD-10-CM | POA: Diagnosis not present

## 2014-10-10 DIAGNOSIS — R58 Hemorrhage, not elsewhere classified: Secondary | ICD-10-CM | POA: Diagnosis present

## 2014-10-10 DIAGNOSIS — F32A Depression, unspecified: Secondary | ICD-10-CM | POA: Diagnosis present

## 2014-10-10 DIAGNOSIS — K661 Hemoperitoneum: Secondary | ICD-10-CM | POA: Diagnosis not present

## 2014-10-10 DIAGNOSIS — Z170 Estrogen receptor positive status [ER+]: Secondary | ICD-10-CM | POA: Diagnosis not present

## 2014-10-10 DIAGNOSIS — F329 Major depressive disorder, single episode, unspecified: Secondary | ICD-10-CM | POA: Diagnosis present

## 2014-10-10 DIAGNOSIS — Z9889 Other specified postprocedural states: Secondary | ICD-10-CM | POA: Diagnosis not present

## 2014-10-10 DIAGNOSIS — Z85820 Personal history of malignant melanoma of skin: Secondary | ICD-10-CM | POA: Diagnosis not present

## 2014-10-10 DIAGNOSIS — T82898A Other specified complication of vascular prosthetic devices, implants and grafts, initial encounter: Principal | ICD-10-CM | POA: Diagnosis present

## 2014-10-10 DIAGNOSIS — I82403 Acute embolism and thrombosis of unspecified deep veins of lower extremity, bilateral: Secondary | ICD-10-CM | POA: Diagnosis not present

## 2014-10-10 DIAGNOSIS — R0902 Hypoxemia: Secondary | ICD-10-CM | POA: Diagnosis not present

## 2014-10-10 DIAGNOSIS — I82423 Acute embolism and thrombosis of iliac vein, bilateral: Secondary | ICD-10-CM | POA: Diagnosis present

## 2014-10-10 DIAGNOSIS — D649 Anemia, unspecified: Secondary | ICD-10-CM | POA: Diagnosis not present

## 2014-10-10 DIAGNOSIS — C50911 Malignant neoplasm of unspecified site of right female breast: Secondary | ICD-10-CM

## 2014-10-10 DIAGNOSIS — R0602 Shortness of breath: Secondary | ICD-10-CM | POA: Diagnosis not present

## 2014-10-10 DIAGNOSIS — I10 Essential (primary) hypertension: Secondary | ICD-10-CM | POA: Diagnosis present

## 2014-10-10 DIAGNOSIS — K219 Gastro-esophageal reflux disease without esophagitis: Secondary | ICD-10-CM | POA: Diagnosis present

## 2014-10-10 DIAGNOSIS — R600 Localized edema: Secondary | ICD-10-CM | POA: Diagnosis not present

## 2014-10-10 DIAGNOSIS — C50511 Malignant neoplasm of lower-outer quadrant of right female breast: Secondary | ICD-10-CM | POA: Diagnosis present

## 2014-10-10 DIAGNOSIS — R5381 Other malaise: Secondary | ICD-10-CM | POA: Diagnosis not present

## 2014-10-10 DIAGNOSIS — R601 Generalized edema: Secondary | ICD-10-CM | POA: Diagnosis not present

## 2014-10-10 DIAGNOSIS — I871 Compression of vein: Secondary | ICD-10-CM | POA: Diagnosis present

## 2014-10-10 DIAGNOSIS — Z7901 Long term (current) use of anticoagulants: Secondary | ICD-10-CM | POA: Diagnosis not present

## 2014-10-10 DIAGNOSIS — E785 Hyperlipidemia, unspecified: Secondary | ICD-10-CM | POA: Diagnosis present

## 2014-10-10 DIAGNOSIS — Z86711 Personal history of pulmonary embolism: Secondary | ICD-10-CM | POA: Diagnosis present

## 2014-10-10 HISTORY — DX: Personal history of pulmonary embolism: Z86.711

## 2014-10-10 LAB — COMPREHENSIVE METABOLIC PANEL
ALBUMIN: 3.8 g/dL (ref 3.5–5.2)
ALT: 19 U/L (ref 0–35)
AST: 30 U/L (ref 0–37)
Alkaline Phosphatase: 68 U/L (ref 39–117)
Anion gap: 13 (ref 5–15)
BUN: 16 mg/dL (ref 6–23)
CALCIUM: 9.8 mg/dL (ref 8.4–10.5)
CO2: 23 mmol/L (ref 19–32)
Chloride: 102 mmol/L (ref 96–112)
Creatinine, Ser: 1.73 mg/dL — ABNORMAL HIGH (ref 0.50–1.10)
GFR calc Af Amer: 33 mL/min — ABNORMAL LOW (ref >90)
GFR calc non Af Amer: 28 mL/min — ABNORMAL LOW (ref >90)
Glucose, Bld: 172 mg/dL — ABNORMAL HIGH (ref 70–99)
POTASSIUM: 4.3 mmol/L (ref 3.5–5.1)
Sodium: 138 mmol/L (ref 135–145)
TOTAL PROTEIN: 6.8 g/dL (ref 6.0–8.3)
Total Bilirubin: 0.6 mg/dL (ref 0.3–1.2)

## 2014-10-10 LAB — CBC WITH DIFFERENTIAL/PLATELET
BASOS ABS: 0 10*3/uL (ref 0.0–0.1)
Basophils Relative: 0 % (ref 0–1)
EOS PCT: 0 % (ref 0–5)
Eosinophils Absolute: 0 10*3/uL (ref 0.0–0.7)
HEMATOCRIT: 38.5 % (ref 36.0–46.0)
Hemoglobin: 11.5 g/dL — ABNORMAL LOW (ref 12.0–15.0)
LYMPHS ABS: 0.6 10*3/uL — AB (ref 0.7–4.0)
Lymphocytes Relative: 5 % — ABNORMAL LOW (ref 12–46)
MCH: 25.3 pg — AB (ref 26.0–34.0)
MCHC: 29.9 g/dL — ABNORMAL LOW (ref 30.0–36.0)
MCV: 84.6 fL (ref 78.0–100.0)
MONOS PCT: 5 % (ref 3–12)
Monocytes Absolute: 0.6 10*3/uL (ref 0.1–1.0)
Neutro Abs: 10.1 10*3/uL — ABNORMAL HIGH (ref 1.7–7.7)
Neutrophils Relative %: 90 % — ABNORMAL HIGH (ref 43–77)
PLATELETS: 257 10*3/uL (ref 150–400)
RBC: 4.55 MIL/uL (ref 3.87–5.11)
RDW: 22.4 % — ABNORMAL HIGH (ref 11.5–15.5)
WBC: 11.3 10*3/uL — ABNORMAL HIGH (ref 4.0–10.5)

## 2014-10-10 LAB — I-STAT TROPONIN, ED: TROPONIN I, POC: 0 ng/mL (ref 0.00–0.08)

## 2014-10-10 LAB — TYPE AND SCREEN
ABO/RH(D): O POS
Antibody Screen: NEGATIVE

## 2014-10-10 LAB — PROTIME-INR
INR: 1.09 (ref 0.00–1.49)
Prothrombin Time: 14.2 seconds (ref 11.6–15.2)

## 2014-10-10 LAB — BRAIN NATRIURETIC PEPTIDE: B Natriuretic Peptide: 66.3 pg/mL (ref 0.0–100.0)

## 2014-10-10 MED ORDER — SODIUM CHLORIDE 0.9 % IV BOLUS (SEPSIS)
1000.0000 mL | Freq: Once | INTRAVENOUS | Status: AC
  Administered 2014-10-10: 1000 mL via INTRAVENOUS

## 2014-10-10 MED ORDER — ALUM & MAG HYDROXIDE-SIMETH 200-200-20 MG/5ML PO SUSP
15.0000 mL | Freq: Once | ORAL | Status: AC
  Administered 2014-10-10: 15 mL via ORAL
  Filled 2014-10-10: qty 30

## 2014-10-10 MED ORDER — ONDANSETRON HCL 4 MG/2ML IJ SOLN
4.0000 mg | Freq: Once | INTRAMUSCULAR | Status: AC
  Administered 2014-10-10: 4 mg via INTRAVENOUS
  Filled 2014-10-10: qty 2
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
## 2014-10-10 NOTE — ED Notes (Signed)
Patient was admitted on 09/27/14 for multiple blood clots.  She has a dull ache in her right leg that moves up her calf when she moves.  Patient had a syncopal episode today in her home.  She states that she is unsure if she hit her head.  When  She came to, she vomited.  Patient states she had multiple episodes today where she became diaphoretic and weak.

## 2014-10-10 NOTE — Telephone Encounter (Signed)
Called by [name] d/t her report of vomiting, fainting spells and persistent blood clot in leg. Returned her call at [phone]. Got her voicemail. Left message for her to call back at [place]. Advised her to go to the Emergency Department for evaluation if she felt that her condition was worse than usual.
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
## 2014-10-10 NOTE — ED Notes (Signed)
Patient is from home. Per EMS patient was seen here last week for clots. Patient has syncopal . Patient is on blood thinners. Patient does not know if she hit her head.

## 2014-10-10 NOTE — ED Notes (Signed)
Dr.Knapp at bedside  

## 2014-10-11 ENCOUNTER — Encounter (HOSPITAL_COMMUNITY): Payer: Self-pay | Admitting: General Practice

## 2014-10-11 ENCOUNTER — Inpatient Hospital Stay (HOSPITAL_COMMUNITY): Payer: Medicare Other

## 2014-10-11 DIAGNOSIS — R0602 Shortness of breath: Secondary | ICD-10-CM

## 2014-10-11 DIAGNOSIS — F329 Major depressive disorder, single episode, unspecified: Secondary | ICD-10-CM

## 2014-10-11 DIAGNOSIS — I2699 Other pulmonary embolism without acute cor pulmonale: Secondary | ICD-10-CM

## 2014-10-11 LAB — CBC
HCT: 33.8 % — ABNORMAL LOW (ref 36.0–46.0)
Hemoglobin: 10.2 g/dL — ABNORMAL LOW (ref 12.0–15.0)
MCH: 25.8 pg — ABNORMAL LOW (ref 26.0–34.0)
MCHC: 30.2 g/dL (ref 30.0–36.0)
MCV: 85.4 fL (ref 78.0–100.0)
PLATELETS: 199 10*3/uL (ref 150–400)
RBC: 3.96 MIL/uL (ref 3.87–5.11)
RDW: 22.7 % — AB (ref 11.5–15.5)
WBC: 8.2 10*3/uL (ref 4.0–10.5)

## 2014-10-11 LAB — COMPREHENSIVE METABOLIC PANEL
ALBUMIN: 3.1 g/dL — AB (ref 3.5–5.2)
ALT: 15 U/L (ref 0–35)
ANION GAP: 10 (ref 5–15)
AST: 23 U/L (ref 0–37)
Alkaline Phosphatase: 55 U/L (ref 39–117)
BILIRUBIN TOTAL: 0.6 mg/dL (ref 0.3–1.2)
BUN: 18 mg/dL (ref 6–23)
CALCIUM: 8.9 mg/dL (ref 8.4–10.5)
CO2: 22 mmol/L (ref 19–32)
CREATININE: 1.1 mg/dL (ref 0.50–1.10)
Chloride: 108 mmol/L (ref 96–112)
GFR calc Af Amer: 57 mL/min — ABNORMAL LOW (ref >90)
GFR calc non Af Amer: 49 mL/min — ABNORMAL LOW (ref >90)
Glucose, Bld: 125 mg/dL — ABNORMAL HIGH (ref 70–99)
Potassium: 3.7 mmol/L (ref 3.5–5.1)
Sodium: 140 mmol/L (ref 135–145)
Total Protein: 5.5 g/dL — ABNORMAL LOW (ref 6.0–8.3)

## 2014-10-11 LAB — LIPID PANEL
CHOL/HDL RATIO: 4.1 ratio
CHOLESTEROL: 182 mg/dL (ref 0–200)
HDL: 44 mg/dL (ref >39)
LDL Cholesterol: 104 mg/dL — ABNORMAL HIGH (ref 0–99)
Triglycerides: 169 mg/dL — ABNORMAL HIGH (ref <150)
VLDL: 34 mg/dL (ref 0–40)

## 2014-10-11 LAB — LIPASE, BLOOD: Lipase: 82 U/L — ABNORMAL HIGH (ref 11–59)

## 2014-10-11 LAB — URINALYSIS, ROUTINE W REFLEX MICROSCOPIC
BILIRUBIN URINE: NEGATIVE
GLUCOSE, UA: NEGATIVE mg/dL
Hgb urine dipstick: NEGATIVE
KETONES UR: NEGATIVE mg/dL
Leukocytes, UA: NEGATIVE
Nitrite: NEGATIVE
Protein, ur: NEGATIVE mg/dL
SPECIFIC GRAVITY, URINE: 1.019 (ref 1.005–1.030)
Urobilinogen, UA: 0.2 mg/dL (ref 0.0–1.0)
pH: 5 (ref 5.0–8.0)

## 2014-10-11 LAB — SODIUM, URINE, RANDOM: SODIUM UR: 42 mmol/L

## 2014-10-11 LAB — CREATININE, URINE, RANDOM: Creatinine, Urine: 162.57 mg/dL

## 2014-10-11 LAB — APTT: APTT: 33 s (ref 24–37)

## 2014-10-11 LAB — GLUCOSE, CAPILLARY: Glucose-Capillary: 147 mg/dL — ABNORMAL HIGH (ref 70–99)

## 2014-10-11 MED ORDER — MORPHINE SULFATE 2 MG/ML IJ SOLN: 1.0000 mg | INTRAMUSCULAR | Status: DC | PRN

## 2014-10-11 MED ORDER — ALRA NON-METALLIC DEODORANT (RAD-ONC): 1.0000 "application " | Freq: Every day | TOPICAL | Status: DC

## 2014-10-11 MED ORDER — SODIUM CHLORIDE 0.9 % IJ SOLN
3.0000 mL | Freq: Two times a day (BID) | INTRAMUSCULAR | Status: DC
  Administered 2014-10-11 – 2014-10-20 (×10): 3 mL via INTRAVENOUS

## 2014-10-11 MED ORDER — OMEGA-3-ACID ETHYL ESTERS 1 G PO CAPS
1.0000 g | ORAL_CAPSULE | Freq: Every day | ORAL | Status: DC
  Administered 2014-10-11 – 2014-10-12 (×2): 1 g via ORAL
  Filled 2014-10-11 (×15): qty 1

## 2014-10-11 MED ORDER — PERFLUTREN LIPID MICROSPHERE
1.0000 mL | INTRAVENOUS | Status: AC | PRN
  Administered 2014-10-11: 2.5 mL via INTRAVENOUS
  Filled 2014-10-11: qty 10

## 2014-10-11 MED ORDER — VITAMIN E 45 MG (100 UNIT) PO CAPS
100.0000 [IU] | ORAL_CAPSULE | Freq: Every day | ORAL | Status: DC
  Administered 2014-10-11 – 2014-10-12 (×2): 100 [IU] via ORAL
  Filled 2014-10-11 (×15): qty 1

## 2014-10-11 MED ORDER — SODIUM CHLORIDE 0.9 % IV SOLN
INTRAVENOUS | Status: DC
  Administered 2014-10-11 – 2014-10-13 (×3): via INTRAVENOUS

## 2014-10-11 MED ORDER — RISAQUAD PO CAPS
1.0000 | ORAL_CAPSULE | Freq: Every day | ORAL | Status: DC
  Administered 2014-10-11 – 2014-10-26 (×14): 1 via ORAL
  Filled 2014-10-11 (×16): qty 1

## 2014-10-11 MED ORDER — FAMOTIDINE 20 MG PO TABS
20.0000 mg | ORAL_TABLET | Freq: Every day | ORAL | Status: DC
Start: 2014-10-11 — End: 2014-10-26
  Administered 2014-10-11 – 2014-10-26 (×15): 20 mg via ORAL
  Filled 2014-10-11 (×16): qty 1

## 2014-10-11 MED ORDER — AMLODIPINE BESYLATE 5 MG PO TABS
5.0000 mg | ORAL_TABLET | Freq: Every day | ORAL | Status: DC
  Administered 2014-10-11 – 2014-10-12 (×2): 5 mg via ORAL
  Filled 2014-10-11 (×2): qty 1

## 2014-10-11 MED ORDER — ONDANSETRON HCL 4 MG/2ML IJ SOLN: 4.0000 mg | Freq: Three times a day (TID) | INTRAMUSCULAR | Status: DC | PRN

## 2014-10-11 MED ORDER — ADULT MULTIVITAMIN W/MINERALS CH
1.0000 | ORAL_TABLET | Freq: Every day | ORAL | Status: DC
  Administered 2014-10-11 – 2014-10-12 (×2): 1 via ORAL
  Filled 2014-10-11 (×15): qty 1

## 2014-10-11 MED ORDER — POLYSACCHARIDE IRON COMPLEX 150 MG PO CAPS
150.0000 mg | ORAL_CAPSULE | Freq: Every day | ORAL | Status: DC
  Administered 2014-10-11 – 2014-10-26 (×14): 150 mg via ORAL
  Filled 2014-10-11 (×16): qty 1

## 2014-10-11 MED ORDER — CALCIUM CARBONATE 1250 (500 CA) MG PO TABS
1500.0000 mg | ORAL_TABLET | Freq: Every day | ORAL | Status: DC
  Administered 2014-10-11 – 2014-10-12 (×2): 1500 mg via ORAL
  Filled 2014-10-11 (×16): qty 3

## 2014-10-11 MED ORDER — ONDANSETRON HCL 4 MG/2ML IJ SOLN
4.0000 mg | Freq: Four times a day (QID) | INTRAMUSCULAR | Status: DC | PRN
  Administered 2014-10-14: 4 mg via INTRAVENOUS
  Filled 2014-10-11: qty 2

## 2014-10-11 MED ORDER — VITAMIN C 250 MG PO TABS
250.0000 mg | ORAL_TABLET | Freq: Every day | ORAL | Status: DC
  Administered 2014-10-11 – 2014-10-12 (×2): 250 mg via ORAL
  Filled 2014-10-11 (×15): qty 1

## 2014-10-11 MED ORDER — ONDANSETRON HCL 4 MG PO TABS: 4.0000 mg | ORAL_TABLET | Freq: Four times a day (QID) | ORAL | Status: DC | PRN

## 2014-10-11 MED ORDER — VITAMIN D 1000 UNITS PO TABS
5000.0000 [IU] | ORAL_TABLET | Freq: Every day | ORAL | Status: DC
  Administered 2014-10-11 – 2014-10-12 (×2): 5000 [IU] via ORAL
  Filled 2014-10-11 (×15): qty 5

## 2014-10-11 MED ORDER — CALCIUM 600 MG PO TABS: 1500.0000 mg | ORAL_TABLET | Freq: Every day | ORAL | Status: DC

## 2014-10-11 MED ORDER — ALUM & MAG HYDROXIDE-SIMETH 200-200-20 MG/5ML PO SUSP: 30.0000 mL | Freq: Four times a day (QID) | ORAL | Status: DC | PRN

## 2014-10-11 MED ORDER — PHILLIPS COLON HEALTH PO CAPS: 1.0000 | ORAL_CAPSULE | Freq: Every day | ORAL | Status: DC

## 2014-10-11 MED ORDER — BIOTIN 2.5 MG PO TABS: 1.0000 | ORAL_TABLET | Freq: Every morning | ORAL | Status: DC

## 2014-10-11 MED ORDER — HEPARIN SODIUM (PORCINE) 5000 UNIT/ML IJ SOLN
5000.0000 [IU] | Freq: Three times a day (TID) | INTRAMUSCULAR | Status: DC
Start: 2014-10-11 — End: 2014-10-15
  Administered 2014-10-14 – 2014-10-15 (×3): 5000 [IU] via SUBCUTANEOUS
  Filled 2014-10-11 (×16): qty 1

## 2014-10-11 NOTE — Telephone Encounter (Signed)
Pt is in ED this morning. Will send to Dr Halford Chessman as Juluis Rainier.

## 2014-10-11 NOTE — Progress Notes (Signed)
Report call, ED RN unavailable at this time, d/t ER lock down. Will re attempt call.

## 2014-10-11 NOTE — Progress Notes (Signed)
Echocardiogram 2D Echocardiogram has been performed.  [name] 10/11/2014, 3:03 PM

## 2014-10-11 NOTE — Progress Notes (Signed)
Pt adm to 5W rm 22 from ED via stretcher, s/p fall at home. Pt  a/o x4, skin D/I, w/brusing to left eye. IV NS infusing to left hand, site w/o redness or edema. Pt oriented to unit, call light, fall protocols, and poc, spouse st bedside, voiced understanding.

## 2014-10-11 NOTE — Progress Notes (Signed)
[facility]                                                                              Patient Demographics  [name], is a 74 y.o. female, DOB - 1940/08/17, QMV:[id number]  Admit date - 10/10/2014   Admitting Physician Ivor Costa, MD  Outpatient Primary MD for the patient is Osborne Casco, MD  LOS - 1   Chief Complaint  Patient presents with  . Loss of Consciousness       Brief HPI   Patient is a 74 year old female with hypertension, hyperlipidemia, GERD, right breast cancer (post status of surgery and radiation therapy), history of pulmonary embolism (with R heart straining by 2D echo on 09/27/14), history of right lower leg DVT, hx of retinal bleeding, recent IVC filter placement on 10/02/14, was entered from home with a syncopal episode. Patient reported that about about 3 PM, she started feeling clammy, sweating and cold, dizzy and lightheaded. She felt that she was going to pass out, but did not. She was doing okay until 6 PM, when she vomited twice, denied any hematemesis and then subsequently had a syncopal episode. She strongly denied having seizure and unilateral weakness. No chest pain. She has mild shortness of breath due to pulmonary embolism, which is at her baseline. She still has mild swelling in the R lower leg due to DVT.  In ED, patient was found to have AKI with Cr 1.73, negative troponin, BNP 66, normal temperature, mild tachycardia, WBC 11.3, INR1.09. CT head is negative for acute abnormalities. EKG showed tachycardia and low voltage, no ischemic change. Patient was admitted to inpatient for further evaluation and treatment   Assessment & Plan    Principal Problem:   Syncope: Possibly due to dehydration, low flow state from a right heart strain with bilateral pulmonary embolism - CT head negative for acute abnormalities, no focal neurological deficits - Repeat 2-D echocardiogram, Continue IV fluid hydration, - PTOT evaluation     Active Problems: Pulmonary embolism: Likely from tamoxifen Prior CT angiogram of the chest on 4/8 showed moderate bordering of bilateral pulmonary embolism with right heart strain. Patient was placed on heparin drip and transitioned to xarelto, however patient was noticed to have increased periorbital ecchymosis hence Xarelto was discontinued. IVC filter was placed on 4/13. Per pulmonology, once stable, IVC will be retrieved and patient will be started on xarelto again.   Acute kidney injury:  Most likely due to dehydration secondary to vomiting.  - Creatinine 1.73 at the time of admission, renal ultrasound negative for acute obstruction or hydronephrosis. - DC lisinopril, continue gentle hydration  GERD: -Continue Pepcid - Will check stool occult test for anemia  Hyperlipidemia: - Patient not on any statins, check lipid panel  Nausea and vomiting: Etiology is not clear, resolved - Lipase is 82 however patient does not have any symptoms of acute pancreatitis. She has no nausea, vomiting, abdominal pain, tolerating regular diet without any difficulty.  Right breast cancer: Post status of radiation and surgery. Patient used take tamoxifen, which was discontinued because of DVT and pulmonary embolism. -follow up with Dr. Alfredia Ferguson   Code Status:  Full code   Family Communication: Discussed in detail with the patient, all imaging results, lab results explained to the patient    Disposition Plan: Hopefully tomorrow if stable, pending 2-D echo   Time Spent in minutes  25 minutes  Procedures  None   Consults   None   DVT Prophylaxis  heparin subcutaneous  Medications  Scheduled Meds: . acidophilus  1 capsule Oral Daily  . amLODipine  5 mg Oral Daily  . calcium carbonate  1,500 mg of elemental calcium Oral Q breakfast  . cholecalciferol  5,000 Units Oral Daily  . famotidine  20 mg Oral Daily  . heparin  5,000 Units Subcutaneous 3 times per day  . iron polysaccharides  150 mg  Oral Daily  . multivitamin with minerals  1 tablet Oral Daily  . omega-3 acid ethyl esters  1 g Oral Daily  . sodium chloride  3 mL Intravenous Q12H  . vitamin C  250 mg Oral Daily  . vitamin E  100 Units Oral Daily   Continuous Infusions: . sodium chloride 75 mL/hr at 10/11/14 0153   PRN Meds:.alum & mag hydroxide-simeth, morphine injection, ondansetron (ZOFRAN) IV, ondansetron **OR** ondansetron (ZOFRAN) IV   Antibiotics   Anti-infectives    None        Subjective:   [name] was seen and examined today. Patient denies dizziness, chest pain, shortness of breath, abdominal pain, N/V/D/C, new weakness, numbess, tingling. No acute events overnight.  Feeling a whole lot better today, no nausea, vomiting.  Objective:   Blood pressure 119/52, pulse 95, temperature 98.7 F (37.1 C), temperature source Oral, resp. rate 22, height 5\' 5"  (1.651 m), weight 90.4 kg (199 lb 4.7 oz), SpO2 94 %.  Wt Readings from Last 3 Encounters:  10/11/14 90.4 kg (199 lb 4.7 oz)  10/02/14 95.6 kg (210 lb 12.2 oz)  05/08/14 93.849 kg (206 lb 14.4 oz)     Intake/Output Summary (Last 24 hours) at 10/11/14 1237 Last data filed at 10/11/14 0945  Gross per 24 hour  Intake   2720 ml  Output    120 ml  Net   2600 ml    Exam  General: Alert and oriented x 3, NAD  HEENT:  PERRLA, EOMI, Anicteic Sclera, mucous membranes moist.   Neck: Supple, no JVD, no masses  CVS: S1 S2 auscultated, no rubs, murmurs or gallops. Regular rate and rhythm.  Respiratory: Clear to auscultation bilaterally, no wheezing, rales or rhonchi  Abdomen: Soft, nontender, nondistended, + bowel sounds  Ext: no cyanosis clubbing or edema  Neuro: AAOx3, Cr N's II- XII. Strength 5/5 upper and lower extremities bilaterally  Skin: No rashes  Psych: Normal affect and demeanor, alert and oriented x3    Data Review   Micro Results No results found for this or any previous visit (from the past 240  hour(s)).  Radiology Reports Dg Chest 2 View  09/27/2014   CLINICAL DATA:  Shortness of breath for a few days.  EXAM: CHEST  2 VIEW  COMPARISON:  PA and lateral chest 07/26/2013.  FINDINGS: The lungs are clear. Heart size is normal. No pneumothorax or pleural effusion. Small hiatal hernia is noted. Calcified breast implants seen on the prior study are no longer visualized.  IMPRESSION: No acute disease.  Small hiatal hernia.   Electronically Signed   By: Inge Rise M.D.   On: 09/27/2014 11:10   Ct Head Wo Contrast  10/10/2014   CLINICAL DATA:  Admitted for multiple  blood clots. Syncopal episode today at home.  EXAM: CT HEAD WITHOUT CONTRAST  TECHNIQUE: Contiguous axial images were obtained from the base of the skull through the vertex without intravenous contrast.  COMPARISON:  09/28/2014; 12/27/2007  FINDINGS: The gray-white differentiation is maintained. No CT evidence of acute large territory infarct. No intraparenchymal or extra-axial mass or hemorrhage. Unchanged size and configuration of the ventricles and basilar cisterns. No midline shift. There is scattered polypoid mucosal thickening of the right posterior ethmoidal air cells as well as the left sphenoid sinus. The remaining paranasal sinuses and mastoid air cells appear normally aerated. No air-fluid levels. Old right-sided nasal bone fracture (representative image 14, series 3). No acutely displaced calvarial fracture. Regional soft tissues appear normal.  IMPRESSION: Negative noncontrast head CT.   Electronically Signed   By: Sandi Mariscal M.D.   On: 10/10/2014 22:50   Ct Head Wo Contrast  09/28/2014   CLINICAL DATA:  Acute pulmonary embolism. No history of breast cancer.  EXAM: CT HEAD WITHOUT CONTRAST  TECHNIQUE: Contiguous axial images were obtained from the base of the skull through the vertex without intravenous contrast.  COMPARISON:  Head CT 12/27/2007  FINDINGS: All No acute intracranial hemorrhage. No focal mass lesion. No CT  evidence of acute infarction. No midline shift or mass effect. No hydrocephalus. Basilar cisterns are patent. Paranasal sinuses and mastoid air cells are clear. Mild periventricular white matter hypodensities.  IMPRESSION: No acute intracranial findings.  Mild white matter microvascular change.   Electronically Signed   By: Suzy Bouchard M.D.   On: 09/28/2014 11:09   Ct Angio Chest Pe W/cm &/or Wo Cm  09/27/2014   CLINICAL DATA:  73YOF today pt was on the toilet and passed out. Small hematoma to head. [facility] reported that pt was orthostatic and there was a loss of radial pulse while standing. History of right breast cancer.  EXAM: CT ANGIOGRAPHY CHEST WITH CONTRAST  TECHNIQUE: Multidetector CT imaging of the chest was performed using the standard protocol during bolus administration of intravenous contrast. Multiplanar CT image reconstructions and MIPs were obtained to evaluate the vascular anatomy.  CONTRAST:  98mL OMNIPAQUE IOHEXOL 350 MG/ML SOLN  COMPARISON:  CT abdomen 08/09/2013 and chest x-ray today.  FINDINGS: Lungs are clear.  Airways are normal.  Heart is normal in size. Pulmonary arterial system is well opacified and demonstrates moderate burden of emboli over the origin of the upper and lower lobar pulmonary arteries bilaterally as well as the proximal lingular and right middle lobe pulmonary arteries. There is evidence of right heart strain with elevated RV/LV ratio of 50.5 mm/27.2 mm = 1.9 (normal less than 0.9).  There is a moderate size hiatal hernia. Remaining mediastinal structures are unremarkable. Bilateral breast implants present.  Images through the upper abdomen demonstrate a 3 mm calcification over the upper pole left kidney. Mild degenerative change of the spine. No acute fractures.  Review of the MIP images confirms the above findings.  IMPRESSION: Moderate burden of bilateral pulmonary emboli. Positive for acute PE with CT evidence of right heart strain (RV/LV Ratio = 1.9) consistent  with at least submassive (intermediate risk) PE. The presence of right heart strain has been associated with an increased risk of morbidity and mortality. Please activate Code PE by paging [phone].  Moderate size hiatal hernia.  3 mm calcification over the upper pole left renal cortex.  Critical Value/emergent results were called by telephone at the time of interpretation on 09/27/2014 at 1:33 pm to Dr. Loree Fee  PLUNKETT , who verbally acknowledged these results.   Electronically Signed   By: Marin Olp M.D.   On: 09/27/2014 13:34   Ir Angiogram Pulmonary Bilateral Selective  09/29/2014   CLINICAL DATA:  Pulmonary thromboembolism.  Right heart strain.  EXAM: BILATERAL PULMONARY ARTERIOGRAPHY; ADDITIONAL ARTERIOGRAPHY; IR ULTRASOUND GUIDANCE VASC ACCESS RIGHT; IR INFUSION THROMBOL ARTERIAL INITIAL (MS)  FLUOROSCOPY TIME:  6 minutes 24 seconds.  MEDICATIONS AND MEDICAL HISTORY: Versed 1 mg, Fentanyl 50 mcg.  Additional Medications: None.  ANESTHESIA/SEDATION: Moderate sedation time: 30 minutes  CONTRAST:  50 cc Omnipaque 300  PROCEDURE: The procedure, risks, benefits, and alternatives were explained to the patient. Questions regarding the procedure were encouraged and answered. The patient understands and consents to the procedure.  The right groin was prepped with Betadine in a sterile fashion, and a sterile drape was applied covering the operative field. A sterile gown and sterile gloves were used for the procedure.  Under sonographic guidance, a micropuncture needle was placed into the right common femoral vein and removed over a 018 wire which was up sized to a Bentson. A 7 French sheath was inserted. The identical procedure was performed in the same vein within additional 7 French sheath. A JB 1 catheter was advanced over a Bentson wire into the left pulmonary artery. Pressure and angiography was performed. Left pulmonary artery pressure was measured at 56/23 with a mean of 36 mm Hg. The catheter was  removed over a longer Rosen wire. The identical procedure was performed into the right pulmonary artery through the other sheath. Right pulmonary artery pressure was measured at 50/23 with a mean of 31 mm Hg. Right and left 18 and 12 cm EKOS infusion catheters were then advanced into the right and left pulmonary arteries. TPA infusion was instituted.  FINDINGS: Imaging confirms right and left pulmonary artery catheter position in the descending branches. Contrast injected confirms positioning and pulmonary thromboembolism.  COMPLICATIONS: None  IMPRESSION: Successful he close bilateral pulmonary artery thrombolytic infusion.   Electronically Signed   By: Marybelle Killings M.D.   On: 09/29/2014 08:46   Ir Angiogram Selective Each Additional Vessel  09/29/2014   CLINICAL DATA:  Pulmonary thromboembolism.  Right heart strain.  EXAM: BILATERAL PULMONARY ARTERIOGRAPHY; ADDITIONAL ARTERIOGRAPHY; IR ULTRASOUND GUIDANCE VASC ACCESS RIGHT; IR INFUSION THROMBOL ARTERIAL INITIAL (MS)  FLUOROSCOPY TIME:  6 minutes 24 seconds.  MEDICATIONS AND MEDICAL HISTORY: Versed 1 mg, Fentanyl 50 mcg.  Additional Medications: None.  ANESTHESIA/SEDATION: Moderate sedation time: 30 minutes  CONTRAST:  50 cc Omnipaque 300  PROCEDURE: The procedure, risks, benefits, and alternatives were explained to the patient. Questions regarding the procedure were encouraged and answered. The patient understands and consents to the procedure.  The right groin was prepped with Betadine in a sterile fashion, and a sterile drape was applied covering the operative field. A sterile gown and sterile gloves were used for the procedure.  Under sonographic guidance, a micropuncture needle was placed into the right common femoral vein and removed over a 018 wire which was up sized to a Bentson. A 7 French sheath was inserted. The identical procedure was performed in the same vein within additional 7 French sheath. A JB 1 catheter was advanced over a Bentson wire into  the left pulmonary artery. Pressure and angiography was performed. Left pulmonary artery pressure was measured at 56/23 with a mean of 36 mm Hg. The catheter was removed over a longer Rosen wire. The identical procedure was performed into the right pulmonary artery  through the other sheath. Right pulmonary artery pressure was measured at 50/23 with a mean of 31 mm Hg. Right and left 18 and 12 cm EKOS infusion catheters were then advanced into the right and left pulmonary arteries. TPA infusion was instituted.  FINDINGS: Imaging confirms right and left pulmonary artery catheter position in the descending branches. Contrast injected confirms positioning and pulmonary thromboembolism.  COMPLICATIONS: None  IMPRESSION: Successful he close bilateral pulmonary artery thrombolytic infusion.   Electronically Signed   By: Marybelle Killings M.D.   On: 09/29/2014 08:46   Ir Angiogram Selective Each Additional Vessel  09/29/2014   CLINICAL DATA:  Pulmonary thromboembolism.  Right heart strain.  EXAM: BILATERAL PULMONARY ARTERIOGRAPHY; ADDITIONAL ARTERIOGRAPHY; IR ULTRASOUND GUIDANCE VASC ACCESS RIGHT; IR INFUSION THROMBOL ARTERIAL INITIAL (MS)  FLUOROSCOPY TIME:  6 minutes 24 seconds.  MEDICATIONS AND MEDICAL HISTORY: Versed 1 mg, Fentanyl 50 mcg.  Additional Medications: None.  ANESTHESIA/SEDATION: Moderate sedation time: 30 minutes  CONTRAST:  50 cc Omnipaque 300  PROCEDURE: The procedure, risks, benefits, and alternatives were explained to the patient. Questions regarding the procedure were encouraged and answered. The patient understands and consents to the procedure.  The right groin was prepped with Betadine in a sterile fashion, and a sterile drape was applied covering the operative field. A sterile gown and sterile gloves were used for the procedure.  Under sonographic guidance, a micropuncture needle was placed into the right common femoral vein and removed over a 018 wire which was up sized to a Bentson. A 7 French  sheath was inserted. The identical procedure was performed in the same vein within additional 7 French sheath. A JB 1 catheter was advanced over a Bentson wire into the left pulmonary artery. Pressure and angiography was performed. Left pulmonary artery pressure was measured at 56/23 with a mean of 36 mm Hg. The catheter was removed over a longer Rosen wire. The identical procedure was performed into the right pulmonary artery through the other sheath. Right pulmonary artery pressure was measured at 50/23 with a mean of 31 mm Hg. Right and left 18 and 12 cm EKOS infusion catheters were then advanced into the right and left pulmonary arteries. TPA infusion was instituted.  FINDINGS: Imaging confirms right and left pulmonary artery catheter position in the descending branches. Contrast injected confirms positioning and pulmonary thromboembolism.  COMPLICATIONS: None  IMPRESSION: Successful he close bilateral pulmonary artery thrombolytic infusion.   Electronically Signed   By: Marybelle Killings M.D.   On: 09/29/2014 08:46   Ir Ivc Filter Plmt / S&i /img Guid/mod Sed  10/02/2014   CLINICAL DATA:  Pulmonary thromboembolism. Peri-orbitall hemorrhage after anti coagulation.  EXAM: IVC FILTER,INFERIOR VENA CAVOGRAM  FLUOROSCOPY TIME:  1 minutes and 12 seconds.  MEDICATIONS AND MEDICAL HISTORY: Versed 1 mg, Fentanyl 50 mcg.  Additional Medications: None.  ANESTHESIA/SEDATION: Moderate sedation time: 15 minutes  CONTRAST:  40 cc Omnipaque 300  PROCEDURE: The procedure, risks, benefits, and alternatives were explained to the patient. Questions regarding the procedure were encouraged and answered. The patient understands and consents to the procedure.  The right neck was prepped with Betadine in a sterile fashion, and a sterile drape was applied covering the operative field. A sterile gown and sterile gloves were used for the procedure.  The Betadine vein was noted to be patent initially with ultrasound. Under sonographic  guidance, a micropuncture needle was inserted into the right internal jugular vein (Ultrasound image documentation was performed). It was removed over an 018 wire  which was upsized to a Rittman. The sheath was inserted over the wire and into the IVC. IVC venography was performed.  The temporary filter was then deployed in the infrarenal IVC. The sheath was removed and hemostasis was achieved with direct pressure.  FINDINGS: IVC venography confirms renal vein inflow at L1. No IVC thrombus or venous anomaly.  The image demonstrates placement of an IVC filter with its tip at the L1-2 disc.  COMPLICATIONS: None  IMPRESSION: Successful infrarenal IVC filter placement. This is a temporary filter. It can be removed or remain in place to become permanent.   Electronically Signed   By: Marybelle Killings M.D.   On: 10/02/2014 13:21   US Renal  10/11/2014   CLINICAL DATA:  Acute renal insufficiency  EXAM: RENAL/URINARY TRACT ULTRASOUND COMPLETE  COMPARISON:  CT scan 2/ 19/ 15  FINDINGS: Right Kidney:  Length: 9 mm. Echogenicity within normal limits. No mass or hydronephrosis visualized. Mild renal cortical thinning probable due to atrophy.  Left Kidney:  Length: 10.7 cm. Echogenicity within normal limits. No mass or hydronephrosis visualized. Mild renal cortical thinning probable due to atrophy.  Bladder:  Appears normal for degree of bladder distention.  IMPRESSION: 1. No hydronephrosis. No renal calculi. Bilateral mild renal cortical thinning probable due to atrophy. Unremarkable urinary bladder.   Electronically Signed   By: Lahoma Crocker M.D.   On: 10/11/2014 09:46   Ir US Guide Vasc Access Right  09/29/2014   CLINICAL DATA:  Pulmonary thromboembolism.  Right heart strain.  EXAM: BILATERAL PULMONARY ARTERIOGRAPHY; ADDITIONAL ARTERIOGRAPHY; IR ULTRASOUND GUIDANCE VASC ACCESS RIGHT; IR INFUSION THROMBOL ARTERIAL INITIAL (MS)  FLUOROSCOPY TIME:  6 minutes 24 seconds.  MEDICATIONS AND MEDICAL HISTORY: Versed 1 mg, Fentanyl 50  mcg.  Additional Medications: None.  ANESTHESIA/SEDATION: Moderate sedation time: 30 minutes  CONTRAST:  50 cc Omnipaque 300  PROCEDURE: The procedure, risks, benefits, and alternatives were explained to the patient. Questions regarding the procedure were encouraged and answered. The patient understands and consents to the procedure.  The right groin was prepped with Betadine in a sterile fashion, and a sterile drape was applied covering the operative field. A sterile gown and sterile gloves were used for the procedure.  Under sonographic guidance, a micropuncture needle was placed into the right common femoral vein and removed over a 018 wire which was up sized to a Bentson. A 7 French sheath was inserted. The identical procedure was performed in the same vein within additional 7 French sheath. A JB 1 catheter was advanced over a Bentson wire into the left pulmonary artery. Pressure and angiography was performed. Left pulmonary artery pressure was measured at 56/23 with a mean of 36 mm Hg. The catheter was removed over a longer Rosen wire. The identical procedure was performed into the right pulmonary artery through the other sheath. Right pulmonary artery pressure was measured at 50/23 with a mean of 31 mm Hg. Right and left 18 and 12 cm EKOS infusion catheters were then advanced into the right and left pulmonary arteries. TPA infusion was instituted.  FINDINGS: Imaging confirms right and left pulmonary artery catheter position in the descending branches. Contrast injected confirms positioning and pulmonary thromboembolism.  COMPLICATIONS: None  IMPRESSION: Successful he close bilateral pulmonary artery thrombolytic infusion.   Electronically Signed   By: Marybelle Killings M.D.   On: 09/29/2014 08:46   Ir Infusion Thrombol Arterial Initial (ms)  09/29/2014   CLINICAL DATA:  Pulmonary thromboembolism.  Right heart strain.  EXAM: BILATERAL  PULMONARY ARTERIOGRAPHY; ADDITIONAL ARTERIOGRAPHY; IR ULTRASOUND GUIDANCE VASC  ACCESS RIGHT; IR INFUSION THROMBOL ARTERIAL INITIAL (MS)  FLUOROSCOPY TIME:  6 minutes 24 seconds.  MEDICATIONS AND MEDICAL HISTORY: Versed 1 mg, Fentanyl 50 mcg.  Additional Medications: None.  ANESTHESIA/SEDATION: Moderate sedation time: 30 minutes  CONTRAST:  50 cc Omnipaque 300  PROCEDURE: The procedure, risks, benefits, and alternatives were explained to the patient. Questions regarding the procedure were encouraged and answered. The patient understands and consents to the procedure.  The right groin was prepped with Betadine in a sterile fashion, and a sterile drape was applied covering the operative field. A sterile gown and sterile gloves were used for the procedure.  Under sonographic guidance, a micropuncture needle was placed into the right common femoral vein and removed over a 018 wire which was up sized to a Bentson. A 7 French sheath was inserted. The identical procedure was performed in the same vein within additional 7 French sheath. A JB 1 catheter was advanced over a Bentson wire into the left pulmonary artery. Pressure and angiography was performed. Left pulmonary artery pressure was measured at 56/23 with a mean of 36 mm Hg. The catheter was removed over a longer Rosen wire. The identical procedure was performed into the right pulmonary artery through the other sheath. Right pulmonary artery pressure was measured at 50/23 with a mean of 31 mm Hg. Right and left 18 and 12 cm EKOS infusion catheters were then advanced into the right and left pulmonary arteries. TPA infusion was instituted.  FINDINGS: Imaging confirms right and left pulmonary artery catheter position in the descending branches. Contrast injected confirms positioning and pulmonary thromboembolism.  COMPLICATIONS: None  IMPRESSION: Successful he close bilateral pulmonary artery thrombolytic infusion.   Electronically Signed   By: Marybelle Killings M.D.   On: 09/29/2014 08:46   Ir Jacolyn Reedy F/u Eval Art/ven Final Day (ms)  09/29/2014    CLINICAL DATA:  Follow-up EKOS pulmonary artery thrombolytic therapy.  EXAM: IR THROMB F/U EVAL ART/VEN FINAL DAY  FLUOROSCOPY TIME:  20 seconds.  MEDICATIONS AND MEDICAL HISTORY: None  ANESTHESIA/SEDATION: None  CONTRAST:  None  PROCEDURE: The procedure, risks, benefits, and alternatives were explained to the patient. Questions regarding the procedure were encouraged and answered. The patient understands and consents to the procedure.  Right and left pulmonary artery pressures were obtained. Right pulmonary artery pressure is 35/27 with a mean of 31 mm Hg. That of the left is 34/29 with a mean of 31 mm Hg. The catheters and sheaths were removed. Hemostasis was achieved with direct pressure and VPAD.  FINDINGS: Fluoroscopic imaging confirms stable position of the right and left pulmonary artery EKOS catheters.  COMPLICATIONS: None  IMPRESSION: Successful conclusion of bilateral pulmonary artery EKOS thrombo lytic therapy. Peak systolic pressures have significantly improved from 56 to 35 mm Hg in the right pulmonary artery and 50 to 34 mm Hg in the left pulmonary artery.   Electronically Signed   By: Marybelle Killings M.D.   On: 09/29/2014 09:21    CBC  Recent Labs Lab 10/10/14 2052  WBC 11.3*  HGB 11.5*  HCT 38.5  PLT 257  MCV 84.6  MCH 25.3*  MCHC 29.9*  RDW 22.4*  LYMPHSABS 0.6*  MONOABS 0.6  EOSABS 0.0  BASOSABS 0.0    Chemistries   Recent Labs Lab 10/10/14 2052  NA 138  K 4.3  CL 102  CO2 23  GLUCOSE 172*  BUN 16  CREATININE 1.73*  CALCIUM 9.8  AST  30  ALT 19  ALKPHOS 68  BILITOT 0.6   ------------------------------------------------------------------------------------------------------------------ estimated creatinine clearance is 32.7 mL/min (by C-G formula based on Cr of 1.73). ------------------------------------------------------------------------------------------------------------------ No results for input(s): HGBA1C in the last 72  hours. ------------------------------------------------------------------------------------------------------------------ No results for input(s): CHOL, HDL, LDLCALC, TRIG, CHOLHDL, LDLDIRECT in the last 72 hours. ------------------------------------------------------------------------------------------------------------------ No results for input(s): TSH, T4TOTAL, T3FREE, THYROIDAB in the last 72 hours.  Invalid input(s): FREET3 ------------------------------------------------------------------------------------------------------------------ No results for input(s): VITAMINB12, FOLATE, FERRITIN, TIBC, IRON, RETICCTPCT in the last 72 hours.  Coagulation profile  Recent Labs Lab 10/10/14 2052  INR 1.09    No results for input(s): DDIMER in the last 72 hours.  Cardiac Enzymes No results for input(s): CKMB, TROPONINI, MYOGLOBIN in the last 168 hours.  Invalid input(s): CK ------------------------------------------------------------------------------------------------------------------ Invalid input(s): POCBNP   Recent Labs  10/11/14 0114  GLUCAP 147*     RAI,RIPUDEEP M.D. [facility] 10/11/2014, 12:37 PM  Pager: [phone]   Between 7am to 7pm - call Pager - [phone]  After 7pm go to [email or web address] - password [email or web address]  Call night coverage person covering after 7pm

## 2014-10-11 NOTE — Evaluation (Signed)
Physical Therapy Evaluation Patient Details Name: [name] MRN: [id number] DOB: 1941-05-22 Today's Date: 10/11/2014   History of Present Illness  Patient is a 74 yo female admitted 10/10/14 following syncopal episode.  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  PMH:  HTN, HLD, breast CA, IVC filter, fibromyalgia, depression  Clinical Impression  Patient presents with problems listed below. Will benefit from acute PT to maximize functional mobility prior to return home with husband.  Do not anticipate any f/u PT needs at d/c.    Follow Up Recommendations No PT follow up;Supervision - Intermittent    Equipment Recommendations  Other (comment) (TBD)    Recommendations for Other Services       Precautions / Restrictions Precautions Precautions: Fall Restrictions Weight Bearing Restrictions: No      Mobility  Bed Mobility Overal bed mobility: Modified Independent             General bed mobility comments: Increased time  Transfers Overall transfer level: Needs assistance Equipment used: Rolling walker (2 wheeled) Transfers: Sit to/from Stand Sit to Stand: Supervision         General transfer comment: Verbal cues for hand placement.  Supervision for safety only.  Ambulation/Gait Ambulation/Gait assistance: Supervision Ambulation Distance (Feet): 30 Feet Assistive device: Rolling walker (2 wheeled) Gait Pattern/deviations: Step-through pattern;Decreased stance time - right;Decreased step length - left;Decreased stride length;Decreased weight shift to right;Antalgic Gait velocity: Decreased Gait velocity interpretation: Below normal speed for age/gender General Gait Details: Patient demonstrates safe use of RW.  Balance good with gait with RW.  Reports increase in pain with weightbearing.  Stairs            Wheelchair Mobility    Modified Rankin (Stroke Patients Only)       Balance                                              Pertinent Vitals/Pain Pain Assessment: 0-10 Pain Score: 3  Pain Location: RLE Pain Descriptors / Indicators: Dull;Sore Pain Intervention(s): Monitored during session;Repositioned    Home Living Family/patient expects to be discharged to:: Private residence Living Arrangements: Spouse/significant other Available Help at Discharge: Family;Available 24 hours/day Type of Home: House Home Access: Level entry;Elevator     Home Layout: Two level;Able to live on main level with bedroom/bathroom Home Equipment: None      Prior Function Level of Independence: Independent         Comments: Drives     Hand Dominance        Extremity/Trunk Assessment   Upper Extremity Assessment: Overall WFL for tasks assessed           Lower Extremity Assessment: RLE deficits/detail RLE Deficits / Details: DVT with edema, especially in foot    Cervical / Trunk Assessment: Normal  Communication   Communication: No difficulties  Cognition Arousal/Alertness: Awake/alert Behavior During Therapy: WFL for tasks assessed/performed;Anxious Overall Cognitive Status: Within Functional Limits for tasks assessed                      General Comments      Exercises General Exercises - Lower Extremity Ankle Circles/Pumps: AROM;Both;10 reps;Supine      Assessment/Plan    PT Assessment Patient needs continued PT services  PT Diagnosis Abnormality of gait;Acute pain   PT Problem List Decreased activity tolerance;Decreased balance;Decreased  mobility;Decreased knowledge of use of DME;Pain  PT Treatment Interventions DME instruction;Gait training;Functional mobility training;Therapeutic activities;Therapeutic exercise;Cognitive remediation   PT Goals (Current goals can be found in the Care Plan section) Acute Rehab PT Goals Patient Stated Goal: To return home PT Goal Formulation: With patient Time For Goal Achievement: 10/18/14 Potential to Achieve Goals: Good     Frequency Min 3X/week   Barriers to discharge        Co-evaluation               End of Session Equipment Utilized During Treatment: Gait belt Activity Tolerance: Patient tolerated treatment well Patient left: in bed;with call bell/phone within reach Nurse Communication: Mobility status         Time: 1314-3888 PT Time Calculation (min) (ACUTE ONLY): 20 min   Charges:   PT Evaluation $Initial PT Evaluation Tier I: 1 Procedure     PT G CodesDespina Pole 2014/11/09, 3:50 PM Carita Pian. Sanjuana Kava, [place] Pager [phone]

## 2014-10-12 ENCOUNTER — Inpatient Hospital Stay (HOSPITAL_COMMUNITY): Payer: Medicare Other

## 2014-10-12 LAB — BASIC METABOLIC PANEL
Anion gap: 10 (ref 5–15)
BUN: 15 mg/dL (ref 6–23)
CALCIUM: 9.5 mg/dL (ref 8.4–10.5)
CHLORIDE: 105 mmol/L (ref 96–112)
CO2: 23 mmol/L (ref 19–32)
CREATININE: 0.88 mg/dL (ref 0.50–1.10)
GFR calc non Af Amer: 64 mL/min — ABNORMAL LOW (ref >90)
GFR, EST AFRICAN AMERICAN: 74 mL/min — AB (ref >90)
Glucose, Bld: 121 mg/dL — ABNORMAL HIGH (ref 70–99)
Potassium: 4.4 mmol/L (ref 3.5–5.1)
Sodium: 138 mmol/L (ref 135–145)

## 2014-10-12 LAB — TROPONIN I: Troponin I: <0.03 ng/mL (ref <0.031)

## 2014-10-12 MED ORDER — AMLODIPINE BESYLATE 5 MG PO TABS: 5.0000 mg | ORAL_TABLET | Freq: Every day | ORAL | Status: DC

## 2014-10-12 MED ORDER — PROMETHAZINE HCL 12.5 MG PO TABS: 12.5000 mg | ORAL_TABLET | Freq: Four times a day (QID) | ORAL | Status: DC | PRN

## 2014-10-12 MED ORDER — SODIUM CHLORIDE 0.9 % IV BOLUS (SEPSIS)
1000.0000 mL | Freq: Once | INTRAVENOUS | Status: AC
  Administered 2014-10-12: 1000 mL via INTRAVENOUS

## 2014-10-12 NOTE — Progress Notes (Signed)
[facility]                                                                              Patient Demographics  [name], is a 74 y.o. female, DOB - [birth year], XNT:[id number]  Admit date - 10/10/2014   Admitting Physician Ivor Costa, MD  Outpatient Primary MD for the patient is Osborne Casco, MD  LOS - 2   Chief Complaint  Patient presents with  . Loss of Consciousness       Brief HPI   Patient is a 74 year old female with hypertension, hyperlipidemia, GERD, right breast cancer (post status of surgery and radiation therapy), history of pulmonary embolism (with R heart straining by 2D echo on 09/27/14), history of right lower leg DVT, hx of retinal bleeding, recent IVC filter placement on 10/02/14, was entered from home with a syncopal episode. Patient reported that about about 3 PM, she started feeling clammy, sweating and cold, dizzy and lightheaded. She felt that she was going to pass out, but did not. She was doing okay until 6 PM, when she vomited twice, denied any hematemesis and then subsequently had a syncopal episode. She strongly denied having seizure and unilateral weakness. No chest pain. She has mild shortness of breath due to pulmonary embolism, which is at her baseline. She still has mild swelling in the R lower leg due to DVT.  In ED, patient was found to have AKI with Cr 1.73, negative troponin, BNP 66, normal temperature, mild tachycardia, WBC 11.3, INR1.09. CT head is negative for acute abnormalities. EKG showed tachycardia and low voltage, no ischemic change. Patient was admitted to inpatient for further evaluation and treatment  4/23: Patient complaining of dizziness, orthostatic vitals positive  Assessment & Plan    Principal Problem:   Syncope: Possibly due to dehydration, low flow state from a right heart strain with bilateral pulmonary embolism, orthostatic - CT head negative for acute abnormalities, no focal neurological  deficits - 2-D echo showed EF of 96-75%, grade 1 diastolic dysfunction, no right heart failure - PTOT evaluation-> no PT follow-up needed - Discontinue Norvasc, IV fluid bolus, continue IV fluid hydration - Ordered cortisol, vitamin B12, folate level - If patient continues to have orthostasis with dizziness, may need to check MRI brain to rule out any cerebellar/posterior circulation ischemia   Active Problems: Pulmonary embolism: Likely from tamoxifen Prior CT angiogram of the chest on 4/8 showed moderate bordering of bilateral pulmonary embolism with right heart strain. Patient was placed on heparin drip and transitioned to xarelto, however patient was noticed to have increased periorbital ecchymosis hence Xarelto was discontinued. IVC filter was placed on 4/13. Per pulmonology, once stable, IVC will be retrieved and patient will be started on xarelto again.  Acute kidney injury:  Most likely due to dehydration secondary to vomiting-> resolved  - Creatinine 1.73 at the time of admission, renal ultrasound negative for acute obstruction or hydronephrosis. - Lisinopril discontinued, continue IV fluid hydration  GERD: -Continue Pepcid - Will check stool occult test for anemia  Hyperlipidemia: - Patient not on any statins, check lipid panel  Nausea and vomiting: Etiology is not clear, resolved -  Lipase is 82 however patient does not have any symptoms of acute pancreatitis. She has no nausea, vomiting, abdominal pain, tolerating regular diet without any difficulty.  Right breast cancer: Post status of radiation and surgery. Patient used take tamoxifen, which was discontinued because of DVT and pulmonary embolism. -follow up with Dr. Lindi Adie   Code Status: Full code   Family Communication: Discussed in detail with the patient, all imaging results, lab results explained to the patient and husband at the bedside   Disposition Plan: Orthostatic today, will monitor closely today, hopefully DC  tomorrow  Time Spent in minutes  25 minutes  Procedures  None   Consults   None   DVT Prophylaxis  heparin subcutaneous  Medications  Scheduled Meds: . acidophilus  1 capsule Oral Daily  . calcium carbonate  1,500 mg of elemental calcium Oral Q breakfast  . cholecalciferol  5,000 Units Oral Daily  . famotidine  20 mg Oral Daily  . heparin  5,000 Units Subcutaneous 3 times per day  . iron polysaccharides  150 mg Oral Daily  . multivitamin with minerals  1 tablet Oral Daily  . omega-3 acid ethyl esters  1 g Oral Daily  . sodium chloride  1,000 mL Intravenous Once  . sodium chloride  3 mL Intravenous Q12H  . vitamin C  250 mg Oral Daily  . vitamin E  100 Units Oral Daily   Continuous Infusions: . sodium chloride 75 mL/hr at 10/11/14 1731   PRN Meds:.alum & mag hydroxide-simeth, morphine injection, ondansetron (ZOFRAN) IV, ondansetron **OR** ondansetron (ZOFRAN) IV   Antibiotics   Anti-infectives    None        Subjective:   Jajaira Ruis was seen and examined today. Patient denies dizziness, chest pain, shortness of breath, abdominal pain, N/V/D/C, new weakness, numbess, tingling. No acute events overnight.  Complaining of dizziness and lightheadedness upon standing up.    Objective:   Blood pressure 103/57, pulse 115, temperature 98 F (36.7 C), temperature source Oral, resp. rate 18, height 5\' 5"  (1.651 m), weight 90.4 kg (199 lb 4.7 oz), SpO2 93 %.  Wt Readings from Last 3 Encounters:  10/11/14 90.4 kg (199 lb 4.7 oz)  10/02/14 95.6 kg (210 lb 12.2 oz)  05/08/14 93.849 kg (206 lb 14.4 oz)     Intake/Output Summary (Last 24 hours) at 10/12/14 1159 Last data filed at 10/12/14 0900  Gross per 24 hour  Intake    483 ml  Output    900 ml  Net   -417 ml    Exam  General: Alert and oriented x 3, NAD  HEENT:  PERRLA, EOMI, Anicteic Sclera, mucous membranes moist.   Neck: Supple, no JVD, no masses  CVS: S1 S2 clear, no MRG  Respiratory: CTA  B  Abdomen: Soft, NT, ND, NBS  Ext: no cyanosis clubbing or edema  Neuro: AAOx3, Cr N's II- XII. Strength 5/5 upper and lower extremities bilaterally  Skin: No rashes  Psych: Normal affect and demeanor, alert and oriented x3    Data Review   Micro Results No results found for this or any previous visit (from the past 240 hour(s)).  Radiology Reports Dg Chest 2 View  09/27/2014   CLINICAL DATA:  Shortness of breath for a few days.  EXAM: CHEST  2 VIEW  COMPARISON:  PA and lateral chest 07/26/2013.  FINDINGS: The lungs are clear. Heart size is normal. No pneumothorax or pleural effusion. Small hiatal hernia is noted. Calcified breast implants  seen on the prior study are no longer visualized.  IMPRESSION: No acute disease.  Small hiatal hernia.   Electronically Signed   By: Inge Rise M.D.   On: 09/27/2014 11:10   Ct Head Wo Contrast  10/10/2014   CLINICAL DATA:  Admitted for multiple blood clots. Syncopal episode today at home.  EXAM: CT HEAD WITHOUT CONTRAST  TECHNIQUE: Contiguous axial images were obtained from the base of the skull through the vertex without intravenous contrast.  COMPARISON:  09/28/2014; 12/27/2007  FINDINGS: The gray-white differentiation is maintained. No CT evidence of acute large territory infarct. No intraparenchymal or extra-axial mass or hemorrhage. Unchanged size and configuration of the ventricles and basilar cisterns. No midline shift. There is scattered polypoid mucosal thickening of the right posterior ethmoidal air cells as well as the left sphenoid sinus. The remaining paranasal sinuses and mastoid air cells appear normally aerated. No air-fluid levels. Old right-sided nasal bone fracture (representative image 14, series 3). No acutely displaced calvarial fracture. Regional soft tissues appear normal.  IMPRESSION: Negative noncontrast head CT.   Electronically Signed   By: Sandi Mariscal M.D.   On: 10/10/2014 22:50   Ct Head Wo Contrast  09/28/2014    CLINICAL DATA:  Acute pulmonary embolism. No history of breast cancer.  EXAM: CT HEAD WITHOUT CONTRAST  TECHNIQUE: Contiguous axial images were obtained from the base of the skull through the vertex without intravenous contrast.  COMPARISON:  Head CT 12/27/2007  FINDINGS: All No acute intracranial hemorrhage. No focal mass lesion. No CT evidence of acute infarction. No midline shift or mass effect. No hydrocephalus. Basilar cisterns are patent. Paranasal sinuses and mastoid air cells are clear. Mild periventricular white matter hypodensities.  IMPRESSION: No acute intracranial findings.  Mild white matter microvascular change.   Electronically Signed   By: Suzy Bouchard M.D.   On: 09/28/2014 11:09   Ct Angio Chest Pe W/cm &/or Wo Cm  09/27/2014   CLINICAL DATA:  73YOF today pt was on the toilet and passed out. Small hematoma to head. [facility] reported that pt was orthostatic and there was a loss of radial pulse while standing. History of right breast cancer.  EXAM: CT ANGIOGRAPHY CHEST WITH CONTRAST  TECHNIQUE: Multidetector CT imaging of the chest was performed using the standard protocol during bolus administration of intravenous contrast. Multiplanar CT image reconstructions and MIPs were obtained to evaluate the vascular anatomy.  CONTRAST:  16mL OMNIPAQUE IOHEXOL 350 MG/ML SOLN  COMPARISON:  CT abdomen 08/09/2013 and chest x-ray today.  FINDINGS: Lungs are clear.  Airways are normal.  Heart is normal in size. Pulmonary arterial system is well opacified and demonstrates moderate burden of emboli over the origin of the upper and lower lobar pulmonary arteries bilaterally as well as the proximal lingular and right middle lobe pulmonary arteries. There is evidence of right heart strain with elevated RV/LV ratio of 50.5 mm/27.2 mm = 1.9 (normal less than 0.9).  There is a moderate size hiatal hernia. Remaining mediastinal structures are unremarkable. Bilateral breast implants present.  Images through the upper  abdomen demonstrate a 3 mm calcification over the upper pole left kidney. Mild degenerative change of the spine. No acute fractures.  Review of the MIP images confirms the above findings.  IMPRESSION: Moderate burden of bilateral pulmonary emboli. Positive for acute PE with CT evidence of right heart strain (RV/LV Ratio = 1.9) consistent with at least submassive (intermediate risk) PE. The presence of right heart strain has been associated with an  increased risk of morbidity and mortality. Please activate Code PE by paging [phone].  Moderate size hiatal hernia.  3 mm calcification over the upper pole left renal cortex.  Critical Value/emergent results were called by telephone at the time of interpretation on 09/27/2014 at 1:33 pm to Dr. Blanchie Dessert , who verbally acknowledged these results.   Electronically Signed   By: Marin Olp M.D.   On: 09/27/2014 13:34   Ir Angiogram Pulmonary Bilateral Selective  09/29/2014   CLINICAL DATA:  Pulmonary thromboembolism.  Right heart strain.  EXAM: BILATERAL PULMONARY ARTERIOGRAPHY; ADDITIONAL ARTERIOGRAPHY; IR ULTRASOUND GUIDANCE VASC ACCESS RIGHT; IR INFUSION THROMBOL ARTERIAL INITIAL (MS)  FLUOROSCOPY TIME:  6 minutes 24 seconds.  MEDICATIONS AND MEDICAL HISTORY: Versed 1 mg, Fentanyl 50 mcg.  Additional Medications: None.  ANESTHESIA/SEDATION: Moderate sedation time: 30 minutes  CONTRAST:  50 cc Omnipaque 300  PROCEDURE: The procedure, risks, benefits, and alternatives were explained to the patient. Questions regarding the procedure were encouraged and answered. The patient understands and consents to the procedure.  The right groin was prepped with Betadine in a sterile fashion, and a sterile drape was applied covering the operative field. A sterile gown and sterile gloves were used for the procedure.  Under sonographic guidance, a micropuncture needle was placed into the right common femoral vein and removed over a 018 wire which was up sized to a Bentson. A  7 French sheath was inserted. The identical procedure was performed in the same vein within additional 7 French sheath. A JB 1 catheter was advanced over a Bentson wire into the left pulmonary artery. Pressure and angiography was performed. Left pulmonary artery pressure was measured at 56/23 with a mean of 36 mm Hg. The catheter was removed over a longer Rosen wire. The identical procedure was performed into the right pulmonary artery through the other sheath. Right pulmonary artery pressure was measured at 50/23 with a mean of 31 mm Hg. Right and left 18 and 12 cm EKOS infusion catheters were then advanced into the right and left pulmonary arteries. TPA infusion was instituted.  FINDINGS: Imaging confirms right and left pulmonary artery catheter position in the descending branches. Contrast injected confirms positioning and pulmonary thromboembolism.  COMPLICATIONS: None  IMPRESSION: Successful he close bilateral pulmonary artery thrombolytic infusion.   Electronically Signed   By: Marybelle Killings M.D.   On: 09/29/2014 08:46   Ir Angiogram Selective Each Additional Vessel  09/29/2014   CLINICAL DATA:  Pulmonary thromboembolism.  Right heart strain.  EXAM: BILATERAL PULMONARY ARTERIOGRAPHY; ADDITIONAL ARTERIOGRAPHY; IR ULTRASOUND GUIDANCE VASC ACCESS RIGHT; IR INFUSION THROMBOL ARTERIAL INITIAL (MS)  FLUOROSCOPY TIME:  6 minutes 24 seconds.  MEDICATIONS AND MEDICAL HISTORY: Versed 1 mg, Fentanyl 50 mcg.  Additional Medications: None.  ANESTHESIA/SEDATION: Moderate sedation time: 30 minutes  CONTRAST:  50 cc Omnipaque 300  PROCEDURE: The procedure, risks, benefits, and alternatives were explained to the patient. Questions regarding the procedure were encouraged and answered. The patient understands and consents to the procedure.  The right groin was prepped with Betadine in a sterile fashion, and a sterile drape was applied covering the operative field. A sterile gown and sterile gloves were used for the procedure.   Under sonographic guidance, a micropuncture needle was placed into the right common femoral vein and removed over a 018 wire which was up sized to a Bentson. A 7 French sheath was inserted. The identical procedure was performed in the same vein within additional 7 French sheath. A JB 1 catheter  was advanced over a Bentson wire into the left pulmonary artery. Pressure and angiography was performed. Left pulmonary artery pressure was measured at 56/23 with a mean of 36 mm Hg. The catheter was removed over a longer Rosen wire. The identical procedure was performed into the right pulmonary artery through the other sheath. Right pulmonary artery pressure was measured at 50/23 with a mean of 31 mm Hg. Right and left 18 and 12 cm EKOS infusion catheters were then advanced into the right and left pulmonary arteries. TPA infusion was instituted.  FINDINGS: Imaging confirms right and left pulmonary artery catheter position in the descending branches. Contrast injected confirms positioning and pulmonary thromboembolism.  COMPLICATIONS: None  IMPRESSION: Successful he close bilateral pulmonary artery thrombolytic infusion.   Electronically Signed   By: Marybelle Killings M.D.   On: 09/29/2014 08:46   Ir Angiogram Selective Each Additional Vessel  09/29/2014   CLINICAL DATA:  Pulmonary thromboembolism.  Right heart strain.  EXAM: BILATERAL PULMONARY ARTERIOGRAPHY; ADDITIONAL ARTERIOGRAPHY; IR ULTRASOUND GUIDANCE VASC ACCESS RIGHT; IR INFUSION THROMBOL ARTERIAL INITIAL (MS)  FLUOROSCOPY TIME:  6 minutes 24 seconds.  MEDICATIONS AND MEDICAL HISTORY: Versed 1 mg, Fentanyl 50 mcg.  Additional Medications: None.  ANESTHESIA/SEDATION: Moderate sedation time: 30 minutes  CONTRAST:  50 cc Omnipaque 300  PROCEDURE: The procedure, risks, benefits, and alternatives were explained to the patient. Questions regarding the procedure were encouraged and answered. The patient understands and consents to the procedure.  The right groin was prepped  with Betadine in a sterile fashion, and a sterile drape was applied covering the operative field. A sterile gown and sterile gloves were used for the procedure.  Under sonographic guidance, a micropuncture needle was placed into the right common femoral vein and removed over a 018 wire which was up sized to a Bentson. A 7 French sheath was inserted. The identical procedure was performed in the same vein within additional 7 French sheath. A JB 1 catheter was advanced over a Bentson wire into the left pulmonary artery. Pressure and angiography was performed. Left pulmonary artery pressure was measured at 56/23 with a mean of 36 mm Hg. The catheter was removed over a longer Rosen wire. The identical procedure was performed into the right pulmonary artery through the other sheath. Right pulmonary artery pressure was measured at 50/23 with a mean of 31 mm Hg. Right and left 18 and 12 cm EKOS infusion catheters were then advanced into the right and left pulmonary arteries. TPA infusion was instituted.  FINDINGS: Imaging confirms right and left pulmonary artery catheter position in the descending branches. Contrast injected confirms positioning and pulmonary thromboembolism.  COMPLICATIONS: None  IMPRESSION: Successful he close bilateral pulmonary artery thrombolytic infusion.   Electronically Signed   By: Marybelle Killings M.D.   On: 09/29/2014 08:46   Ir Ivc Filter Plmt / S&i /img Guid/mod Sed  10/02/2014   CLINICAL DATA:  Pulmonary thromboembolism. Peri-orbitall hemorrhage after anti coagulation.  EXAM: IVC FILTER,INFERIOR VENA CAVOGRAM  FLUOROSCOPY TIME:  1 minutes and 12 seconds.  MEDICATIONS AND MEDICAL HISTORY: Versed 1 mg, Fentanyl 50 mcg.  Additional Medications: None.  ANESTHESIA/SEDATION: Moderate sedation time: 15 minutes  CONTRAST:  40 cc Omnipaque 300  PROCEDURE: The procedure, risks, benefits, and alternatives were explained to the patient. Questions regarding the procedure were encouraged and answered. The  patient understands and consents to the procedure.  The right neck was prepped with Betadine in a sterile fashion, and a sterile drape was applied covering the operative  field. A sterile gown and sterile gloves were used for the procedure.  The Betadine vein was noted to be patent initially with ultrasound. Under sonographic guidance, a micropuncture needle was inserted into the right internal jugular vein (Ultrasound image documentation was performed). It was removed over an 018 wire which was upsized to a [place]. The sheath was inserted over the wire and into the IVC. IVC venography was performed.  The temporary filter was then deployed in the infrarenal IVC. The sheath was removed and hemostasis was achieved with direct pressure.  FINDINGS: IVC venography confirms renal vein inflow at L1. No IVC thrombus or venous anomaly.  The image demonstrates placement of an IVC filter with its tip at the L1-2 disc.  COMPLICATIONS: None  IMPRESSION: Successful infrarenal IVC filter placement. This is a temporary filter. It can be removed or remain in place to become permanent.   Electronically Signed   By: Marybelle Killings M.D.   On: 10/02/2014 13:21   US Renal  10/11/2014   CLINICAL DATA:  Acute renal insufficiency  EXAM: RENAL/URINARY TRACT ULTRASOUND COMPLETE  COMPARISON:  CT scan 2/ 19/ 15  FINDINGS: Right Kidney:  Length: 9 mm. Echogenicity within normal limits. No mass or hydronephrosis visualized. Mild renal cortical thinning probable due to atrophy.  Left Kidney:  Length: 10.7 cm. Echogenicity within normal limits. No mass or hydronephrosis visualized. Mild renal cortical thinning probable due to atrophy.  Bladder:  Appears normal for degree of bladder distention.  IMPRESSION: 1. No hydronephrosis. No renal calculi. Bilateral mild renal cortical thinning probable due to atrophy. Unremarkable urinary bladder.   Electronically Signed   By: Lahoma Crocker M.D.   On: 10/11/2014 09:46   Ir US Guide Vasc Access  Right  09/29/2014   CLINICAL DATA:  Pulmonary thromboembolism.  Right heart strain.  EXAM: BILATERAL PULMONARY ARTERIOGRAPHY; ADDITIONAL ARTERIOGRAPHY; IR ULTRASOUND GUIDANCE VASC ACCESS RIGHT; IR INFUSION THROMBOL ARTERIAL INITIAL (MS)  FLUOROSCOPY TIME:  6 minutes 24 seconds.  MEDICATIONS AND MEDICAL HISTORY: Versed 1 mg, Fentanyl 50 mcg.  Additional Medications: None.  ANESTHESIA/SEDATION: Moderate sedation time: 30 minutes  CONTRAST:  50 cc Omnipaque 300  PROCEDURE: The procedure, risks, benefits, and alternatives were explained to the patient. Questions regarding the procedure were encouraged and answered. The patient understands and consents to the procedure.  The right groin was prepped with Betadine in a sterile fashion, and a sterile drape was applied covering the operative field. A sterile gown and sterile gloves were used for the procedure.  Under sonographic guidance, a micropuncture needle was placed into the right common femoral vein and removed over a 018 wire which was up sized to a Bentson. A 7 French sheath was inserted. The identical procedure was performed in the same vein within additional 7 French sheath. A JB 1 catheter was advanced over a Bentson wire into the left pulmonary artery. Pressure and angiography was performed. Left pulmonary artery pressure was measured at 56/23 with a mean of 36 mm Hg. The catheter was removed over a longer Rosen wire. The identical procedure was performed into the right pulmonary artery through the other sheath. Right pulmonary artery pressure was measured at 50/23 with a mean of 31 mm Hg. Right and left 18 and 12 cm EKOS infusion catheters were then advanced into the right and left pulmonary arteries. TPA infusion was instituted.  FINDINGS: Imaging confirms right and left pulmonary artery catheter position in the descending branches. Contrast injected confirms positioning and pulmonary thromboembolism.  COMPLICATIONS: None  IMPRESSION: Successful he close  bilateral pulmonary artery thrombolytic infusion.   Electronically Signed   By: Marybelle Killings M.D.   On: 09/29/2014 08:46   Ir Infusion Thrombol Arterial Initial (ms)  09/29/2014   CLINICAL DATA:  Pulmonary thromboembolism.  Right heart strain.  EXAM: BILATERAL PULMONARY ARTERIOGRAPHY; ADDITIONAL ARTERIOGRAPHY; IR ULTRASOUND GUIDANCE VASC ACCESS RIGHT; IR INFUSION THROMBOL ARTERIAL INITIAL (MS)  FLUOROSCOPY TIME:  6 minutes 24 seconds.  MEDICATIONS AND MEDICAL HISTORY: Versed 1 mg, Fentanyl 50 mcg.  Additional Medications: None.  ANESTHESIA/SEDATION: Moderate sedation time: 30 minutes  CONTRAST:  50 cc Omnipaque 300  PROCEDURE: The procedure, risks, benefits, and alternatives were explained to the patient. Questions regarding the procedure were encouraged and answered. The patient understands and consents to the procedure.  The right groin was prepped with Betadine in a sterile fashion, and a sterile drape was applied covering the operative field. A sterile gown and sterile gloves were used for the procedure.  Under sonographic guidance, a micropuncture needle was placed into the right common femoral vein and removed over a 018 wire which was up sized to a Bentson. A 7 French sheath was inserted. The identical procedure was performed in the same vein within additional 7 French sheath. A JB 1 catheter was advanced over a Bentson wire into the left pulmonary artery. Pressure and angiography was performed. Left pulmonary artery pressure was measured at 56/23 with a mean of 36 mm Hg. The catheter was removed over a longer Rosen wire. The identical procedure was performed into the right pulmonary artery through the other sheath. Right pulmonary artery pressure was measured at 50/23 with a mean of 31 mm Hg. Right and left 18 and 12 cm EKOS infusion catheters were then advanced into the right and left pulmonary arteries. TPA infusion was instituted.  FINDINGS: Imaging confirms right and left pulmonary artery catheter  position in the descending branches. Contrast injected confirms positioning and pulmonary thromboembolism.  COMPLICATIONS: None  IMPRESSION: Successful he close bilateral pulmonary artery thrombolytic infusion.   Electronically Signed   By: Marybelle Killings M.D.   On: 09/29/2014 08:46   Ir Jacolyn Reedy F/u Eval Art/ven Final Day (ms)  09/29/2014   CLINICAL DATA:  Follow-up EKOS pulmonary artery thrombolytic therapy.  EXAM: IR THROMB F/U EVAL ART/VEN FINAL DAY  FLUOROSCOPY TIME:  20 seconds.  MEDICATIONS AND MEDICAL HISTORY: None  ANESTHESIA/SEDATION: None  CONTRAST:  None  PROCEDURE: The procedure, risks, benefits, and alternatives were explained to the patient. Questions regarding the procedure were encouraged and answered. The patient understands and consents to the procedure.  Right and left pulmonary artery pressures were obtained. Right pulmonary artery pressure is 35/27 with a mean of 31 mm Hg. That of the left is 34/29 with a mean of 31 mm Hg. The catheters and sheaths were removed. Hemostasis was achieved with direct pressure and VPAD.  FINDINGS: Fluoroscopic imaging confirms stable position of the right and left pulmonary artery EKOS catheters.  COMPLICATIONS: None  IMPRESSION: Successful conclusion of bilateral pulmonary artery EKOS thrombo lytic therapy. Peak systolic pressures have significantly improved from 56 to 35 mm Hg in the right pulmonary artery and 50 to 34 mm Hg in the left pulmonary artery.   Electronically Signed   By: Marybelle Killings M.D.   On: 09/29/2014 09:21    CBC  Recent Labs Lab 10/10/14 2052 10/11/14 1055  WBC 11.3* 8.2  HGB 11.5* 10.2*  HCT 38.5 33.8*  PLT 257 199  MCV 84.6 85.4  MCH  25.3* 25.8*  MCHC 29.9* 30.2  RDW 22.4* 22.7*  LYMPHSABS 0.6*  --   MONOABS 0.6  --   EOSABS 0.0  --   BASOSABS 0.0  --     Chemistries   Recent Labs Lab 10/10/14 2052 10/11/14 1055 10/12/14 0530  NA 138 140 138  K 4.3 3.7 4.4  CL 102 108 105  CO2 23 22 23   GLUCOSE 172* 125* 121*   BUN 16 18 15   CREATININE 1.73* 1.10 0.88  CALCIUM 9.8 8.9 9.5  AST 30 23  --   ALT 19 15  --   ALKPHOS 68 55  --   BILITOT 0.6 0.6  --    ------------------------------------------------------------------------------------------------------------------ estimated creatinine clearance is 64.2 mL/min (by C-G formula based on Cr of 0.88). ------------------------------------------------------------------------------------------------------------------ No results for input(s): HGBA1C in the last 72 hours. ------------------------------------------------------------------------------------------------------------------  Recent Labs  10/11/14 1055  CHOL 182  HDL 44  LDLCALC 104*  TRIG 169*  CHOLHDL 4.1   ------------------------------------------------------------------------------------------------------------------ No results for input(s): TSH, T4TOTAL, T3FREE, THYROIDAB in the last 72 hours.  Invalid input(s): FREET3 ------------------------------------------------------------------------------------------------------------------ No results for input(s): VITAMINB12, FOLATE, FERRITIN, TIBC, IRON, RETICCTPCT in the last 72 hours.  Coagulation profile  Recent Labs Lab 10/10/14 2052  INR 1.09    No results for input(s): DDIMER in the last 72 hours.  Cardiac Enzymes No results for input(s): CKMB, TROPONINI, MYOGLOBIN in the last 168 hours.  Invalid input(s): CK ------------------------------------------------------------------------------------------------------------------ Invalid input(s): POCBNP   Recent Labs  10/11/14 0114  GLUCAP 147*     Crystol Walpole M.D. [facility] 10/12/2014, 11:59 AM  Pager: [phone]   Between 7am to 7pm - call Pager - [phone]  After 7pm go to [email or web address] - password [email or web address]  Call night coverage person covering after 7pm

## 2014-10-12 NOTE — Progress Notes (Signed)
PT Cancellation Note  Patient Details Name: [name] MRN: [id number] DOB: [birth year]   Cancelled Treatment:    Reason Eval/Treat Not Completed: Medical issues which prohibited therapy.  Patient reports she had dizzy/fainting spell and N/V earlier in day.  Patient declined PT today.  Will return tomorrow for PT session.   Despina Pole 10/12/2014, 4:41 PM Carita Pian. Sanjuana Kava, [place] Pager [phone]

## 2014-10-12 NOTE — Progress Notes (Signed)
Patient verbalized "im feel like i'm going to faint" each time she getting up. RN at bedside advise patient to take deep breath sand rise slowly with each ambulation. Ortho VS perform. Will inform MD of result. Will continue to monitor.  Ave Filter, RN

## 2014-10-13 DIAGNOSIS — I871 Compression of vein: Secondary | ICD-10-CM

## 2014-10-13 LAB — URINE CULTURE: Colony Count: >=100000

## 2014-10-13 LAB — CORTISOL: Cortisol, Plasma: 52.4 ug/dL

## 2014-10-13 LAB — TROPONIN I

## 2014-10-13 LAB — VITAMIN B12: VITAMIN B 12: 1677 pg/mL — AB (ref 211–911)

## 2014-10-13 LAB — FOLATE: Folate: 17.8 ng/mL

## 2014-10-13 MED ORDER — FUROSEMIDE 10 MG/ML IJ SOLN: 20.0000 mg | Freq: Once | INTRAMUSCULAR | Status: DC
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
## 2014-10-13 NOTE — Progress Notes (Addendum)
[facility]                                                                              Patient Demographics  [name], is a 74 y.o. female, DOB - 11-01-1940, CBJ:[id number]  Admit date - 10/10/2014   Admitting Physician Ivor Costa, MD  Outpatient Primary MD for the patient is Osborne Casco, MD  LOS - 3   Chief Complaint  Patient presents with  . Loss of Consciousness       Brief HPI   Patient is a 74 year old female with hypertension, hyperlipidemia, GERD, right breast cancer (post status of surgery and radiation therapy), history of pulmonary embolism (with R heart straining by 2D echo on 09/27/14), history of right lower leg DVT, hx of retinal bleeding, recent IVC filter placement on 10/02/14, was entered from home with a syncopal episode. Patient reported that about about 3 PM, she started feeling clammy, sweating and cold, dizzy and lightheaded. She felt that she was going to pass out, but did not. She was doing okay until 6 PM, when she vomited twice, denied any hematemesis and then subsequently had a syncopal episode. She strongly denied having seizure and unilateral weakness. No chest pain. She has mild shortness of breath due to pulmonary embolism, which is at her baseline. She still has mild swelling in the R lower leg due to DVT.  In ED, patient was found to have AKI with Cr 1.73, negative troponin, BNP 66, normal temperature, mild tachycardia, WBC 11.3, INR1.09. CT head is negative for acute abnormalities. EKG showed tachycardia and low voltage, no ischemic change. Patient was admitted to inpatient for further evaluation and treatment  4/23: Patient complaining of dizziness, orthostatic vitals positive  Assessment & Plan    Principal Problem:  Recurrent Syncope: Possibly due to dehydration, low flow state from a right heart strain with bilateral pulmonary embolism, still orthostatic - CT head negative for acute abnormalities, no focal  neurological deficits - 2-D echo showed EF of 16-07%, grade 1 diastolic dysfunction, no right heart failure - PTOT evaluation-> no PT follow-up needed - Norvasc was discontinued, was orthostatic yesterday gave IV fluid bolus, rechecked orthostatic vitals still positive hence fluids were continued gentle, today having significant peripheral edema. Hence fluids discontinued this morning. - cortisol, vitamin B12, folate level all within normal limits - Patient reported Nausea with dizziness and orthostatic yesterday, hence MRI of the brain was checked which was negative for any posterior circulation ischemia - She still continues to be orthostatic, unfortunately cannot add midodrine or Florinef due to significant peripheral edema. TED hoses ordered however may not work out due to peripheral edema. I am hesitant to give her Lasix due to orthostasis. Cardiology consult called for recommendations, d/w Dr Wynonia Lawman. Addendum: 5:15pm Discussed in detail with Dr Acie Fredrickson and reviewed receommendations. I d/w oncall radiologist who recommended CT abd/pelvis with IV contrast to assess IVC and can add CTA chest to assess any progression of PE.  I had ordered both and will discuss with pulmonology pending study results.      Active Problems:  recent Pulmonary embolism: Likely from tamoxifen Prior CT angiogram of the  chest on 4/8 showed moderate bordering of bilateral pulmonary embolism with right heart strain. Patient was placed on heparin drip and transitioned to xarelto, however patient was noticed to have increased periorbital ecchymosis hence Xarelto was discontinued. IVC filter was placed on 4/13. Per pulmonology, once stable, IVC will be retrieved and patient will be started on xarelto again.  Acute kidney injury:  Most likely due to dehydration secondary to vomiting-> resolved  - Creatinine 1.73 at the time of admission, renal ultrasound negative for acute obstruction or hydronephrosis. -  Lisinoprildiscontinued   GERD: -Continue Pepcid  Hyperlipidemia: - Patient not on any statins, check lipid panel  Nausea and vomiting: Etiology is not clear, resolved - Lipase is 82 however patient does not have any symptoms of acute pancreatitis. She has  no abdominal pain, tolerating regular diet   Right breast cancer:  status post XRT and surgery. Patient used to take tamoxifen, which was discontinued because of DVT and pulmonary embolism. -follow up with Dr. Lindi Adie  Code Status: Full code   Family Communication: Discussed in detail with the patient, all imaging results, lab results explained to the patient and husband at the bedside   Disposition Plan: Pending cardiology evaluation  Time Spent in minutes  25 minutes  Procedures  None   Consults   None   DVT Prophylaxis  heparin subcutaneous  Medications  Scheduled Meds: . acidophilus  1 capsule Oral Daily  . calcium carbonate  1,500 mg of elemental calcium Oral Q breakfast  . cholecalciferol  5,000 Units Oral Daily  . famotidine  20 mg Oral Daily  . heparin  5,000 Units Subcutaneous 3 times per day  . iron polysaccharides  150 mg Oral Daily  . multivitamin with minerals  1 tablet Oral Daily  . omega-3 acid ethyl esters  1 g Oral Daily  . sodium chloride  3 mL Intravenous Q12H  . vitamin C  250 mg Oral Daily  . vitamin E  100 Units Oral Daily   Continuous Infusions:   PRN Meds:.alum & mag hydroxide-simeth, morphine injection, ondansetron (ZOFRAN) IV, ondansetron **OR** ondansetron (ZOFRAN) IV   Antibiotics   Anti-infectives    None        Subjective:   [name] was seen and examined today. Patient denies chest pain, shortness of breath, abdominal pain, N/V/D/C, new weakness, numbess, tingling.  Continues to be orthostatic, significant peripheral edema, dizziness, slight nausea but no vomiting   Objective:   Blood pressure 135/64, pulse 100, temperature 98.3 F (36.8 C), temperature source  Oral, resp. rate 20, height 5\' 5"  (1.651 m), weight 90.4 kg (199 lb 4.7 oz), SpO2 96 %.  Wt Readings from Last 3 Encounters:  10/11/14 90.4 kg (199 lb 4.7 oz)  10/02/14 95.6 kg (210 lb 12.2 oz)  05/08/14 93.849 kg (206 lb 14.4 oz)     Intake/Output Summary (Last 24 hours) at 10/13/14 1159 Last data filed at 10/13/14 1149  Gross per 24 hour  Intake 1669.66 ml  Output    700 ml  Net 969.66 ml    Exam  General: Alert and oriented x 3, NAD  HEENT:  PERRLA, EOMI, Anicteic Sclera, mucous membranes moist.   Neck: Supple, no JVD, no masses  CVS: S1 S2 clear, no MRG  Respiratory: Clear to auscultation bilaterally  Abdomen: Soft, nontender nondistended normal bowel sounds  Ext: no cyanosis clubbing, 2+ edema  Neuro: AAOx3, Cr N's II- XII. Strength 5/5 upper and lower extremities bilaterally  Skin: No rashes  Psych: Normal affect and demeanor, alert and oriented x3    Data Review   Micro Results Recent Results (from the past 240 hour(s))  Urine culture     Status: None   Collection Time: 10/11/14  8:34 AM  Result Value Ref Range Status   Specimen Description URINE, CLEAN CATCH  Final   Special Requests NONE  Final   Colony Count   Final    >=100,000 COLONIES/ML Performed at [facility]    Culture   Final    Multiple bacterial morphotypes present, none predominant. Suggest appropriate recollection if clinically indicated. Performed at [facility]    Report Status 10/13/2014 FINAL  Final    Radiology Reports Dg Chest 2 View  09/27/2014   CLINICAL DATA:  Shortness of breath for a few days.  EXAM: CHEST  2 VIEW  COMPARISON:  PA and lateral chest 07/26/2013.  FINDINGS: The lungs are clear. Heart size is normal. No pneumothorax or pleural effusion. Small hiatal hernia is noted. Calcified breast implants seen on the prior study are no longer visualized.  IMPRESSION: No acute disease.  Small hiatal hernia.   Electronically Signed   By: Inge Rise M.D.    On: 09/27/2014 11:10   Ct Head Wo Contrast  10/10/2014   CLINICAL DATA:  Admitted for multiple blood clots. Syncopal episode today at home.  EXAM: CT HEAD WITHOUT CONTRAST  TECHNIQUE: Contiguous axial images were obtained from the base of the skull through the vertex without intravenous contrast.  COMPARISON:  09/28/2014; 12/27/2007  FINDINGS: The gray-white differentiation is maintained. No CT evidence of acute large territory infarct. No intraparenchymal or extra-axial mass or hemorrhage. Unchanged size and configuration of the ventricles and basilar cisterns. No midline shift. There is scattered polypoid mucosal thickening of the right posterior ethmoidal air cells as well as the left sphenoid sinus. The remaining paranasal sinuses and mastoid air cells appear normally aerated. No air-fluid levels. Old right-sided nasal bone fracture (representative image 14, series 3). No acutely displaced calvarial fracture. Regional soft tissues appear normal.  IMPRESSION: Negative noncontrast head CT.   Electronically Signed   By: Sandi Mariscal M.D.   On: 10/10/2014 22:50   Ct Head Wo Contrast  09/28/2014   CLINICAL DATA:  Acute pulmonary embolism. No history of breast cancer.  EXAM: CT HEAD WITHOUT CONTRAST  TECHNIQUE: Contiguous axial images were obtained from the base of the skull through the vertex without intravenous contrast.  COMPARISON:  Head CT 12/27/2007  FINDINGS: All No acute intracranial hemorrhage. No focal mass lesion. No CT evidence of acute infarction. No midline shift or mass effect. No hydrocephalus. Basilar cisterns are patent. Paranasal sinuses and mastoid air cells are clear. Mild periventricular white matter hypodensities.  IMPRESSION: No acute intracranial findings.  Mild white matter microvascular change.   Electronically Signed   By: Suzy Bouchard M.D.   On: 09/28/2014 11:09   Ct Angio Chest Pe W/cm &/or Wo Cm  09/27/2014   CLINICAL DATA:  73YOF today pt was on the toilet and passed out.  Small hematoma to head. [facility] reported that pt was orthostatic and there was a loss of radial pulse while standing. History of right breast cancer.  EXAM: CT ANGIOGRAPHY CHEST WITH CONTRAST  TECHNIQUE: Multidetector CT imaging of the chest was performed using the standard protocol during bolus administration of intravenous contrast. Multiplanar CT image reconstructions and MIPs were obtained to evaluate the vascular anatomy.  CONTRAST:  20mL OMNIPAQUE IOHEXOL 350 MG/ML SOLN  COMPARISON:  CT abdomen 08/09/2013 and chest x-ray today.  FINDINGS: Lungs are clear.  Airways are normal.  Heart is normal in size. Pulmonary arterial system is well opacified and demonstrates moderate burden of emboli over the origin of the upper and lower lobar pulmonary arteries bilaterally as well as the proximal lingular and right middle lobe pulmonary arteries. There is evidence of right heart strain with elevated RV/LV ratio of 50.5 mm/27.2 mm = 1.9 (normal less than 0.9).  There is a moderate size hiatal hernia. Remaining mediastinal structures are unremarkable. Bilateral breast implants present.  Images through the upper abdomen demonstrate a 3 mm calcification over the upper pole left kidney. Mild degenerative change of the spine. No acute fractures.  Review of the MIP images confirms the above findings.  IMPRESSION: Moderate burden of bilateral pulmonary emboli. Positive for acute PE with CT evidence of right heart strain (RV/LV Ratio = 1.9) consistent with at least submassive (intermediate risk) PE. The presence of right heart strain has been associated with an increased risk of morbidity and mortality. Please activate Code PE by paging [phone].  Moderate size hiatal hernia.  3 mm calcification over the upper pole left renal cortex.  Critical Value/emergent results were called by telephone at the time of interpretation on 09/27/2014 at 1:33 pm to Dr. Blanchie Dessert , who verbally acknowledged these results.   Electronically  Signed   By: Marin Olp M.D.   On: 09/27/2014 13:34   Mr Brain Wo Contrast  10/12/2014   CLINICAL DATA:  Episode of feeling clammy, diaphoretic, cold, dizzy, and lightheaded beginning 4 hours ago. History of right lower extremity DVT and pulmonary embolism recently. IVC filter placement. Syncopal episode. Ortho static dizziness.  EXAM: MRI HEAD WITHOUT CONTRAST  TECHNIQUE: Multiplanar, multiecho pulse sequences of the brain and surrounding structures were obtained without intravenous contrast.  COMPARISON:  CT head without contrast 10/10/2014  FINDINGS: No acute infarct, hemorrhage, or mass lesion is present. Mild atrophy is present. Moderate periventricular and scattered subcortical T2 changes are noted bilaterally. The ventricles are proportionate to the degree of atrophy. Mild white matter changes extend into the brainstem.  The globes and orbits are intact. Mild mucosal thickening is present within the ethmoid air cells bilaterally. There are no fluid levels. The remaining paranasal sinuses are clear. There is fluid in the left mastoid air cells. No obstructing nasopharyngeal lesion is present.  IMPRESSION: 1. No acute intracranial abnormality. 2. Moderate periventricular and scattered subcortical T2 changes bilaterally. These are nonspecific, but likely reflects sequela of chronic microvascular ischemia. 3. Left mastoid effusion. No obstructing nasopharyngeal lesion is present.   Electronically Signed   By: San Morelle M.D.   On: 10/12/2014 19:14   Ir Angiogram Pulmonary Bilateral Selective  09/29/2014   CLINICAL DATA:  Pulmonary thromboembolism.  Right heart strain.  EXAM: BILATERAL PULMONARY ARTERIOGRAPHY; ADDITIONAL ARTERIOGRAPHY; IR ULTRASOUND GUIDANCE VASC ACCESS RIGHT; IR INFUSION THROMBOL ARTERIAL INITIAL (MS)  FLUOROSCOPY TIME:  6 minutes 24 seconds.  MEDICATIONS AND MEDICAL HISTORY: Versed 1 mg, Fentanyl 50 mcg.  Additional Medications: None.  ANESTHESIA/SEDATION: Moderate sedation  time: 30 minutes  CONTRAST:  50 cc Omnipaque 300  PROCEDURE: The procedure, risks, benefits, and alternatives were explained to the patient. Questions regarding the procedure were encouraged and answered. The patient understands and consents to the procedure.  The right groin was prepped with Betadine in a sterile fashion, and a sterile drape was applied covering the operative field. A sterile gown and sterile gloves were used  for the procedure.  Under sonographic guidance, a micropuncture needle was placed into the right common femoral vein and removed over a 018 wire which was up sized to a Bentson. A 7 French sheath was inserted. The identical procedure was performed in the same vein within additional 7 French sheath. A JB 1 catheter was advanced over a Bentson wire into the left pulmonary artery. Pressure and angiography was performed. Left pulmonary artery pressure was measured at 56/23 with a mean of 36 mm Hg. The catheter was removed over a longer Rosen wire. The identical procedure was performed into the right pulmonary artery through the other sheath. Right pulmonary artery pressure was measured at 50/23 with a mean of 31 mm Hg. Right and left 18 and 12 cm EKOS infusion catheters were then advanced into the right and left pulmonary arteries. TPA infusion was instituted.  FINDINGS: Imaging confirms right and left pulmonary artery catheter position in the descending branches. Contrast injected confirms positioning and pulmonary thromboembolism.  COMPLICATIONS: None  IMPRESSION: Successful he close bilateral pulmonary artery thrombolytic infusion.   Electronically Signed   By: Marybelle Killings M.D.   On: 09/29/2014 08:46   Ir Angiogram Selective Each Additional Vessel  09/29/2014   CLINICAL DATA:  Pulmonary thromboembolism.  Right heart strain.  EXAM: BILATERAL PULMONARY ARTERIOGRAPHY; ADDITIONAL ARTERIOGRAPHY; IR ULTRASOUND GUIDANCE VASC ACCESS RIGHT; IR INFUSION THROMBOL ARTERIAL INITIAL (MS)  FLUOROSCOPY  TIME:  6 minutes 24 seconds.  MEDICATIONS AND MEDICAL HISTORY: Versed 1 mg, Fentanyl 50 mcg.  Additional Medications: None.  ANESTHESIA/SEDATION: Moderate sedation time: 30 minutes  CONTRAST:  50 cc Omnipaque 300  PROCEDURE: The procedure, risks, benefits, and alternatives were explained to the patient. Questions regarding the procedure were encouraged and answered. The patient understands and consents to the procedure.  The right groin was prepped with Betadine in a sterile fashion, and a sterile drape was applied covering the operative field. A sterile gown and sterile gloves were used for the procedure.  Under sonographic guidance, a micropuncture needle was placed into the right common femoral vein and removed over a 018 wire which was up sized to a Bentson. A 7 French sheath was inserted. The identical procedure was performed in the same vein within additional 7 French sheath. A JB 1 catheter was advanced over a Bentson wire into the left pulmonary artery. Pressure and angiography was performed. Left pulmonary artery pressure was measured at 56/23 with a mean of 36 mm Hg. The catheter was removed over a longer Rosen wire. The identical procedure was performed into the right pulmonary artery through the other sheath. Right pulmonary artery pressure was measured at 50/23 with a mean of 31 mm Hg. Right and left 18 and 12 cm EKOS infusion catheters were then advanced into the right and left pulmonary arteries. TPA infusion was instituted.  FINDINGS: Imaging confirms right and left pulmonary artery catheter position in the descending branches. Contrast injected confirms positioning and pulmonary thromboembolism.  COMPLICATIONS: None  IMPRESSION: Successful he close bilateral pulmonary artery thrombolytic infusion.   Electronically Signed   By: Marybelle Killings M.D.   On: 09/29/2014 08:46   Ir Angiogram Selective Each Additional Vessel  09/29/2014   CLINICAL DATA:  Pulmonary thromboembolism.  Right heart strain.   EXAM: BILATERAL PULMONARY ARTERIOGRAPHY; ADDITIONAL ARTERIOGRAPHY; IR ULTRASOUND GUIDANCE VASC ACCESS RIGHT; IR INFUSION THROMBOL ARTERIAL INITIAL (MS)  FLUOROSCOPY TIME:  6 minutes 24 seconds.  MEDICATIONS AND MEDICAL HISTORY: Versed 1 mg, Fentanyl 50 mcg.  Additional Medications: None.  ANESTHESIA/SEDATION: Moderate sedation time: 30 minutes  CONTRAST:  50 cc Omnipaque 300  PROCEDURE: The procedure, risks, benefits, and alternatives were explained to the patient. Questions regarding the procedure were encouraged and answered. The patient understands and consents to the procedure.  The right groin was prepped with Betadine in a sterile fashion, and a sterile drape was applied covering the operative field. A sterile gown and sterile gloves were used for the procedure.  Under sonographic guidance, a micropuncture needle was placed into the right common femoral vein and removed over a 018 wire which was up sized to a Bentson. A 7 French sheath was inserted. The identical procedure was performed in the same vein within additional 7 French sheath. A JB 1 catheter was advanced over a Bentson wire into the left pulmonary artery. Pressure and angiography was performed. Left pulmonary artery pressure was measured at 56/23 with a mean of 36 mm Hg. The catheter was removed over a longer Rosen wire. The identical procedure was performed into the right pulmonary artery through the other sheath. Right pulmonary artery pressure was measured at 50/23 with a mean of 31 mm Hg. Right and left 18 and 12 cm EKOS infusion catheters were then advanced into the right and left pulmonary arteries. TPA infusion was instituted.  FINDINGS: Imaging confirms right and left pulmonary artery catheter position in the descending branches. Contrast injected confirms positioning and pulmonary thromboembolism.  COMPLICATIONS: None  IMPRESSION: Successful he close bilateral pulmonary artery thrombolytic infusion.   Electronically Signed   By: Marybelle Killings M.D.   On: 09/29/2014 08:46   Ir Ivc Filter Plmt / S&i /img Guid/mod Sed  10/02/2014   CLINICAL DATA:  Pulmonary thromboembolism. Peri-orbitall hemorrhage after anti coagulation.  EXAM: IVC FILTER,INFERIOR VENA CAVOGRAM  FLUOROSCOPY TIME:  1 minutes and 12 seconds.  MEDICATIONS AND MEDICAL HISTORY: Versed 1 mg, Fentanyl 50 mcg.  Additional Medications: None.  ANESTHESIA/SEDATION: Moderate sedation time: 15 minutes  CONTRAST:  40 cc Omnipaque 300  PROCEDURE: The procedure, risks, benefits, and alternatives were explained to the patient. Questions regarding the procedure were encouraged and answered. The patient understands and consents to the procedure.  The right neck was prepped with Betadine in a sterile fashion, and a sterile drape was applied covering the operative field. A sterile gown and sterile gloves were used for the procedure.  The Betadine vein was noted to be patent initially with ultrasound. Under sonographic guidance, a micropuncture needle was inserted into the right internal jugular vein (Ultrasound image documentation was performed). It was removed over an 018 wire which was upsized to a Vass. The sheath was inserted over the wire and into the IVC. IVC venography was performed.  The temporary filter was then deployed in the infrarenal IVC. The sheath was removed and hemostasis was achieved with direct pressure.  FINDINGS: IVC venography confirms renal vein inflow at L1. No IVC thrombus or venous anomaly.  The image demonstrates placement of an IVC filter with its tip at the L1-2 disc.  COMPLICATIONS: None  IMPRESSION: Successful infrarenal IVC filter placement. This is a temporary filter. It can be removed or remain in place to become permanent.   Electronically Signed   By: Marybelle Killings M.D.   On: 10/02/2014 13:21   US Renal  10/11/2014   CLINICAL DATA:  Acute renal insufficiency  EXAM: RENAL/URINARY TRACT ULTRASOUND COMPLETE  COMPARISON:  CT scan 2/ 19/ 15  FINDINGS: Right Kidney:   Length: 9 mm. Echogenicity within normal limits. No  mass or hydronephrosis visualized. Mild renal cortical thinning probable due to atrophy.  Left Kidney:  Length: 10.7 cm. Echogenicity within normal limits. No mass or hydronephrosis visualized. Mild renal cortical thinning probable due to atrophy.  Bladder:  Appears normal for degree of bladder distention.  IMPRESSION: 1. No hydronephrosis. No renal calculi. Bilateral mild renal cortical thinning probable due to atrophy. Unremarkable urinary bladder.   Electronically Signed   By: Lahoma Crocker M.D.   On: 10/11/2014 09:46   Ir US Guide Vasc Access Right  09/29/2014   CLINICAL DATA:  Pulmonary thromboembolism.  Right heart strain.  EXAM: BILATERAL PULMONARY ARTERIOGRAPHY; ADDITIONAL ARTERIOGRAPHY; IR ULTRASOUND GUIDANCE VASC ACCESS RIGHT; IR INFUSION THROMBOL ARTERIAL INITIAL (MS)  FLUOROSCOPY TIME:  6 minutes 24 seconds.  MEDICATIONS AND MEDICAL HISTORY: Versed 1 mg, Fentanyl 50 mcg.  Additional Medications: None.  ANESTHESIA/SEDATION: Moderate sedation time: 30 minutes  CONTRAST:  50 cc Omnipaque 300  PROCEDURE: The procedure, risks, benefits, and alternatives were explained to the patient. Questions regarding the procedure were encouraged and answered. The patient understands and consents to the procedure.  The right groin was prepped with Betadine in a sterile fashion, and a sterile drape was applied covering the operative field. A sterile gown and sterile gloves were used for the procedure.  Under sonographic guidance, a micropuncture needle was placed into the right common femoral vein and removed over a 018 wire which was up sized to a Bentson. A 7 French sheath was inserted. The identical procedure was performed in the same vein within additional 7 French sheath. A JB 1 catheter was advanced over a Bentson wire into the left pulmonary artery. Pressure and angiography was performed. Left pulmonary artery pressure was measured at 56/23 with a mean of 36 mm Hg.  The catheter was removed over a longer Rosen wire. The identical procedure was performed into the right pulmonary artery through the other sheath. Right pulmonary artery pressure was measured at 50/23 with a mean of 31 mm Hg. Right and left 18 and 12 cm EKOS infusion catheters were then advanced into the right and left pulmonary arteries. TPA infusion was instituted.  FINDINGS: Imaging confirms right and left pulmonary artery catheter position in the descending branches. Contrast injected confirms positioning and pulmonary thromboembolism.  COMPLICATIONS: None  IMPRESSION: Successful he close bilateral pulmonary artery thrombolytic infusion.   Electronically Signed   By: Marybelle Killings M.D.   On: 09/29/2014 08:46   Ir Infusion Thrombol Arterial Initial (ms)  09/29/2014   CLINICAL DATA:  Pulmonary thromboembolism.  Right heart strain.  EXAM: BILATERAL PULMONARY ARTERIOGRAPHY; ADDITIONAL ARTERIOGRAPHY; IR ULTRASOUND GUIDANCE VASC ACCESS RIGHT; IR INFUSION THROMBOL ARTERIAL INITIAL (MS)  FLUOROSCOPY TIME:  6 minutes 24 seconds.  MEDICATIONS AND MEDICAL HISTORY: Versed 1 mg, Fentanyl 50 mcg.  Additional Medications: None.  ANESTHESIA/SEDATION: Moderate sedation time: 30 minutes  CONTRAST:  50 cc Omnipaque 300  PROCEDURE: The procedure, risks, benefits, and alternatives were explained to the patient. Questions regarding the procedure were encouraged and answered. The patient understands and consents to the procedure.  The right groin was prepped with Betadine in a sterile fashion, and a sterile drape was applied covering the operative field. A sterile gown and sterile gloves were used for the procedure.  Under sonographic guidance, a micropuncture needle was placed into the right common femoral vein and removed over a 018 wire which was up sized to a Bentson. A 7 French sheath was inserted. The identical procedure was performed in the same vein  within additional 7 [place] sheath. A JB 1 catheter was advanced over a  Bentson wire into the left pulmonary artery. Pressure and angiography was performed. Left pulmonary artery pressure was measured at 56/23 with a mean of 36 mm Hg. The catheter was removed over a longer Rosen wire. The identical procedure was performed into the right pulmonary artery through the other sheath. Right pulmonary artery pressure was measured at 50/23 with a mean of 31 mm Hg. Right and left 18 and 12 cm EKOS infusion catheters were then advanced into the right and left pulmonary arteries. TPA infusion was instituted.  FINDINGS: Imaging confirms right and left pulmonary artery catheter position in the descending branches. Contrast injected confirms positioning and pulmonary thromboembolism.  COMPLICATIONS: None  IMPRESSION: Successful he close bilateral pulmonary artery thrombolytic infusion.   Electronically Signed   By: Marybelle Killings M.D.   On: 09/29/2014 08:46   Ir Jacolyn Reedy F/u Eval Art/ven Final Day (ms)  09/29/2014   CLINICAL DATA:  Follow-up EKOS pulmonary artery thrombolytic therapy.  EXAM: IR THROMB F/U EVAL ART/VEN FINAL DAY  FLUOROSCOPY TIME:  20 seconds.  MEDICATIONS AND MEDICAL HISTORY: None  ANESTHESIA/SEDATION: None  CONTRAST:  None  PROCEDURE: The procedure, risks, benefits, and alternatives were explained to the patient. Questions regarding the procedure were encouraged and answered. The patient understands and consents to the procedure.  Right and left pulmonary artery pressures were obtained. Right pulmonary artery pressure is 35/27 with a mean of 31 mm Hg. That of the left is 34/29 with a mean of 31 mm Hg. The catheters and sheaths were removed. Hemostasis was achieved with direct pressure and VPAD.  FINDINGS: Fluoroscopic imaging confirms stable position of the right and left pulmonary artery EKOS catheters.  COMPLICATIONS: None  IMPRESSION: Successful conclusion of bilateral pulmonary artery EKOS thrombo lytic therapy. Peak systolic pressures have significantly improved from 56 to 35 mm  Hg in the right pulmonary artery and 50 to 34 mm Hg in the left pulmonary artery.   Electronically Signed   By: Marybelle Killings M.D.   On: 09/29/2014 09:21    CBC  Recent Labs Lab 10/10/14 2052 10/11/14 1055  WBC 11.3* 8.2  HGB 11.5* 10.2*  HCT 38.5 33.8*  PLT 257 199  MCV 84.6 85.4  MCH 25.3* 25.8*  MCHC 29.9* 30.2  RDW 22.4* 22.7*  LYMPHSABS 0.6*  --   MONOABS 0.6  --   EOSABS 0.0  --   BASOSABS 0.0  --     Chemistries   Recent Labs Lab 10/10/14 2052 10/11/14 1055 10/12/14 0530  NA 138 140 138  K 4.3 3.7 4.4  CL 102 108 105  CO2 23 22 23   GLUCOSE 172* 125* 121*  BUN 16 18 15   CREATININE 1.73* 1.10 0.88  CALCIUM 9.8 8.9 9.5  AST 30 23  --   ALT 19 15  --   ALKPHOS 68 55  --   BILITOT 0.6 0.6  --    ------------------------------------------------------------------------------------------------------------------ estimated creatinine clearance is 64.2 mL/min (by C-G formula based on Cr of 0.88). ------------------------------------------------------------------------------------------------------------------ No results for input(s): HGBA1C in the last 72 hours. ------------------------------------------------------------------------------------------------------------------  Recent Labs  10/11/14 1055  CHOL 182  HDL 44  LDLCALC 104*  TRIG 169*  CHOLHDL 4.1   ------------------------------------------------------------------------------------------------------------------ No results for input(s): TSH, T4TOTAL, T3FREE, THYROIDAB in the last 72 hours.  Invalid input(s): FREET3 ------------------------------------------------------------------------------------------------------------------  Recent Labs  10/12/14 1232  VITAMINB12 1677*  FOLATE 17.8    Coagulation profile  Recent  Labs Lab 10/10/14 2052  INR 1.09    No results for input(s): DDIMER in the last 72 hours.  Cardiac Enzymes  Recent Labs Lab 10/12/14 1232 10/12/14 1645  10/12/14 2330  TROPONINI <0.03 <0.03 <0.03   ------------------------------------------------------------------------------------------------------------------ Invalid input(s): POCBNP   Recent Labs  10/11/14 0114  GLUCAP 147*     RAI,RIPUDEEP M.D. [facility] 10/13/2014, 11:59 AM  Pager: [phone]   Between 7am to 7pm - call Pager - [phone]  After 7pm go to [email or web address] - password [email or web address]  Call night coverage person covering after 7pm

## 2014-10-13 NOTE — Progress Notes (Signed)
PT Cancellation Note  Patient Details Name: Lilja Soland MRN: [id number] DOB: Aug 27, 1940   Cancelled Treatment:    Reason Eval/Treat Not Completed: Medical issues which prohibited therapy.  Explained to patient that we received order for Vestibular evaluation.  Patient reports she did not want to get up while her legs are swollen.  Also states her dizziness is because her BP drops, not vestibular.  Patient declined PT at this time.  Will return at a later time.   Despina Pole 10/13/2014, 3:15 PM Carita Pian. Sanjuana Kava, [place] Pager [phone]

## 2014-10-13 NOTE — Progress Notes (Signed)
Patient refusing all oral meds at this time, MD Rai aware. Will continue to monitor

## 2014-10-13 NOTE — Progress Notes (Signed)
Patients BLE 2+ edema. Pt states that "this is new to lefft leg" MD Rai notified via text page. Awaiting orders

## 2014-10-14 ENCOUNTER — Inpatient Hospital Stay (HOSPITAL_COMMUNITY): Payer: Medicare Other

## 2014-10-14 DIAGNOSIS — C50911 Malignant neoplasm of unspecified site of right female breast: Secondary | ICD-10-CM

## 2014-10-14 DIAGNOSIS — Z95828 Presence of other vascular implants and grafts: Secondary | ICD-10-CM

## 2014-10-14 DIAGNOSIS — Z86718 Personal history of other venous thrombosis and embolism: Secondary | ICD-10-CM

## 2014-10-14 DIAGNOSIS — D649 Anemia, unspecified: Secondary | ICD-10-CM | POA: Diagnosis present

## 2014-10-14 DIAGNOSIS — R58 Hemorrhage, not elsewhere classified: Secondary | ICD-10-CM | POA: Diagnosis present

## 2014-10-14 DIAGNOSIS — Z86711 Personal history of pulmonary embolism: Secondary | ICD-10-CM

## 2014-10-14 DIAGNOSIS — N179 Acute kidney failure, unspecified: Secondary | ICD-10-CM | POA: Diagnosis present

## 2014-10-14 DIAGNOSIS — Z9889 Other specified postprocedural states: Secondary | ICD-10-CM

## 2014-10-14 DIAGNOSIS — R600 Localized edema: Secondary | ICD-10-CM

## 2014-10-14 DIAGNOSIS — R601 Generalized edema: Secondary | ICD-10-CM

## 2014-10-14 MED ORDER — IOHEXOL 350 MG/ML SOLN
80.0000 mL | Freq: Once | INTRAVENOUS | Status: AC | PRN
  Administered 2014-10-14: 80 mL via INTRAVENOUS

## 2014-10-14 MED ORDER — SODIUM CHLORIDE 0.9 % IJ SOLN
10.0000 mL | INTRAMUSCULAR | Status: DC | PRN
  Administered 2014-10-15 (×2): 10 mL
  Filled 2014-10-14 (×2): qty 40

## 2014-10-14 NOTE — Progress Notes (Signed)
[facility] Progress Note Patient Name: [name] DOB: 1940/07/01 MRN: [id number]   Date of Service  10/14/2014  HPI/Events of Note  Radiology called to say the an Ultrasound study with not be helpful and recommends a CT Scan of the abdomen and pelvis with contrast.   eICU Interventions  [place] study D/Ced and CT Scan Abdomen and Pelvis ordered as suggested by Radiology.      Intervention Category Minor Interventions: Clinical assessment - ordering diagnostic tests  Lysle Dingwall 10/14/2014, 9:27 PM

## 2014-10-14 NOTE — Progress Notes (Signed)
Physical Therapy Treatment Patient Details Name: [name] MRN: [id number] DOB: 02/20/1941 Today's Date: 10/14/2014    History of Present Illness Patient is a 74 yo female admitted 10/10/14 following syncopal episode.  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  PMH:  HTN, HLD, breast CA, IVC filter, fibromyalgia, depression    PT Comments    Patient with limited progress due to not wanting to move due to LE pain.  Does have increased edema left LE now limiting AAROM more than on right.  Discussed need to mobilize frequently, but to sit awhile prior to standing due to her report of orthostatic intolerance.  States last time she walked she vomited and almost passed out.  Denies need for vestibular evaluation due to all her issues coming from BP issues so will cancel vestibular eval.  Follow Up Recommendations  No PT follow up;Supervision - Intermittent     Equipment Recommendations  Other (comment) (TBD)    Recommendations for Other Services       Precautions / Restrictions Precautions Precautions: Fall    Mobility  Bed Mobility               General bed mobility comments: pt refuses to move out of bed due to pain in LE's and swelling.  "want to wait to find out why this is going on."  Transfers                    Ambulation/Gait                 Stairs            Wheelchair Mobility    Modified Rankin (Stroke Patients Only)       Balance                                    Cognition Arousal/Alertness: Awake/alert Behavior During Therapy: WFL for tasks assessed/performed Overall Cognitive Status: Within Functional Limits for tasks assessed                      Exercises General Exercises - Lower Extremity Ankle Circles/Pumps: AROM;Both;10 reps;Supine Gluteal Sets: AROM;Both;10 reps;Supine Heel Slides: AAROM;10 reps;Both;Supine Hip ABduction/ADduction: AAROM;Both;10 reps;Supine    General  Comments        Pertinent Vitals/Pain Pain Score: 4  Pain Location: bilat LE's with movement Pain Intervention(s): Limited activity within patient's tolerance    Home Living                      Prior Function            PT Goals (current goals can now be found in the care plan section) Progress towards PT goals: Not progressing toward goals - comment (due to patient refusal for OOB at this time)    Frequency  Min 3X/week    PT Plan Current plan remains appropriate    Co-evaluation             End of Session   Activity Tolerance: Patient limited by pain Patient left: in bed;with call bell/phone within reach;with family/visitor present     Time: 1335-1355 PT Time Calculation (min) (ACUTE ONLY): 20 min  Charges:  $Therapeutic Exercise: 8-22 mins                    G Codes:  [name],[name] 10/14/2014, 2:06 PM  [name], [name] 10/14/2014

## 2014-10-14 NOTE — Progress Notes (Signed)
OT Cancellation Note  Patient Details Name: Kewana Sanon MRN: [id number] DOB: 14-Mar-1941   Cancelled Treatment:    Reason Eval/Treat Not Completed: Other (comment). Pt refusing to get up with OT this morning and stated she did not want to get worked up before her CT.   Benito Mccreedy OTR/L [phone] 10/14/2014, 10:15 AM

## 2014-10-14 NOTE — Progress Notes (Signed)
Peripherally Inserted Central Catheter/Midline Placement  The IV Nurse has discussed with the patient and/or persons authorized to consent for the patient, the purpose of this procedure and the potential benefits and risks involved with this procedure.  The benefits include less needle sticks, lab draws from the catheter and patient may be discharged home with the catheter.  Risks include, but not limited to, infection, bleeding, blood clot (thrombus formation), and puncture of an artery; nerve damage and irregular heat beat.  Alternatives to this procedure were also discussed.  PICC/Midline Placement Documentation        [name] 10/14/2014, 10:58 AM Consent obtained by Claretha Cooper, RN

## 2014-10-14 NOTE — Progress Notes (Signed)
Subjective:  No SOB at rest  Objective:  Vital Signs in the last 24 hours: Temp:  [98.3 F (36.8 C)-98.9 F (37.2 C)] 98.9 F (37.2 C) (04/25 0544) Pulse Rate:  [100-106] 105 (04/25 0544) Resp:  [17-20] 17 (04/25 0544) BP: (135-147)/(64-77) 139/73 mmHg (04/25 0544) SpO2:  [95 %-98 %] 95 % (04/25 0544)  Intake/Output from previous day:  Intake/Output Summary (Last 24 hours) at 10/14/14 0802 Last data filed at 10/13/14 2000  Gross per 24 hour  Intake   1080 ml  Output    800 ml  Net    280 ml    Physical Exam: General appearance: alert, cooperative, no distress and pale Neck: no JVD Lungs: clear to auscultation bilaterally Heart: regular rate and rhythm Abdomen: soft, non tender Extremities: 2-3+ edema to her thighs   Rate: 88  Rhythm: normal sinus rhythm by EKG 10/10/14  Lab Results:  Recent Labs  10/11/14 1055  WBC 8.2  HGB 10.2*  PLT 199    Recent Labs  10/11/14 1055 10/12/14 0530  NA 140 138  K 3.7 4.4  CL 108 105  CO2 22 23  GLUCOSE 125* 121*  BUN 18 15  CREATININE 1.10 0.88    Recent Labs  10/12/14 1645 10/12/14 2330  TROPONINI <0.03 <0.03   No results for input(s): INR in the last 72 hours.  Scheduled Meds: . acidophilus  1 capsule Oral Daily  . calcium carbonate  1,500 mg of elemental calcium Oral Q breakfast  . cholecalciferol  5,000 Units Oral Daily  . famotidine  20 mg Oral Daily  . heparin  5,000 Units Subcutaneous 3 times per day  . iron polysaccharides  150 mg Oral Daily  . multivitamin with minerals  1 tablet Oral Daily  . omega-3 acid ethyl esters  1 g Oral Daily  . sodium chloride  3 mL Intravenous Q12H  . vitamin C  250 mg Oral Daily  . 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 She is s/p radiation therapy in Dec 2014. Has long standing h/o anemia. GI endoscopic eval has been negative. She is s/p breast reconstruction surgery in Jan 2016 for ruptured left breast implant. She was discharged to home on 1/7 w/ some limitation in mobility. Starting about 8 weeks prior to admit she started to note right groin and leg pain. Admitted on 09/27/14 w/ acute bilateral Pulmonary emboli and w/ evidence of right heart strain. She was placed on Xarelto but had spontaneous peri orbital hemorrhage.  An IVC filter was placed 10/02/14. She was discharged 10/03/14. She is admitted now with syncope and lower extremity edema to her thighs.   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   Kerin Ransom PA-C Beeper [phone] 10/14/2014, 8:02 AM   Patient seen  and examined. Agree with assessment and plan. No dyspnea. I/O [phone] since admission.  Lungs clear. Mild tenderness R mid abdominal area. BS+ LE edema R>L. Normalization of right heart and function on f/u echo. For CT scan today to assess for IVC thrombus.   Troy Sine, MD, [facility] 10/14/2014 8:43 AM

## 2014-10-14 NOTE — Care Management Note (Unsigned)
    Page 1 of 1   10/16/2014     11:21:40 AM CARE MANAGEMENT NOTE 10/16/2014  Patient:  [facility]   Account Number:  [id number]  Date Initiated:  10/14/2014  Documentation initiated by:  Tomi Bamberger  Subjective/Objective Assessment:   dx syncope  admit-lives with spouse.     Action/Plan:   Anticipated DC Date:  10/17/2014   Anticipated DC Plan:  [place]  CM consult      Choice offered to / List presented to:             Status of service:  In process, will continue to follow Medicare Important Message given?  YES (If response is "NO", the following Medicare IM given date fields will be blank) Date Medicare IM given:  10/14/2014 Medicare IM given by:  Tomi Bamberger Date Additional Medicare IM given:   Additional Medicare IM given by:    Discharge Disposition:    Per UR Regulation:    If discussed at Long Length of Stay Meetings, dates discussed:    Comments:  10/16/14 [place], BSN [phone] patient will be on eliquis 5mg  bid, awaiting benefit check.

## 2014-10-14 NOTE — Progress Notes (Signed)
*  PRELIMINARY RESULTS* Vascular Ultrasound Lower extremity venous duplex has been completed.  Preliminary findings: Acute DVT noted in Bilateral common femoral, femoral, popliteal, posterior tibial, and gastroc veins. Superficial thrombosis is noted in the left lesser saphenous vein.  Landry Mellow, RDMS, RVT  10/14/2014, 5:10 PM

## 2014-10-14 NOTE — Progress Notes (Signed)
[facility]                                                                              Patient Demographics  [name], is a 74 y.o. female, DOB - [birth year], KZL:[id number]  Admit date - 10/10/2014   Admitting Physician Ivor Costa, MD  Outpatient Primary MD for the patient is Osborne Casco, MD  LOS - 4   Chief Complaint  Patient presents with  . Loss of Consciousness       Brief HPI   Patient is a 74 year old female with hypertension, hyperlipidemia, GERD, right breast cancer (post status of surgery and radiation therapy), history of pulmonary embolism (with R heart straining by 2D echo on 09/27/14), history of right lower leg DVT, hx of retinal bleeding, recent IVC filter placement on 10/02/14, was entered from home with a syncopal episode. Patient reported that about about 3 PM, she started feeling clammy, sweating and cold, dizzy and lightheaded. She felt that she was going to pass out, but did not. She was doing okay until 6 PM, when she vomited twice, denied any hematemesis and then subsequently had a syncopal episode. She strongly denied having seizure and unilateral weakness. No chest pain. She has mild shortness of breath due to pulmonary embolism, which is at her baseline. She still has mild swelling in the R lower leg due to DVT.  In ED, patient was found to have AKI with Cr 1.73, negative troponin, BNP 66, normal temperature, mild tachycardia, WBC 11.3, INR1.09. CT head is negative for acute abnormalities. EKG showed tachycardia and low voltage, no ischemic change. Patient was admitted to inpatient for further evaluation and treatment  4/23: Patient complaining of dizziness, orthostatic vitals positive  Assessment & Plan    Principal Problem:  Recurrent Syncope: Possibly due to low flow state from a right heart strain with bilateral pulmonary embolism, still orthostatic, ? IVC obstruction or progression of PE - CT head negative for acute  abnormalities, no focal neurological deficits - 2-D echo showed EF of 39-03%, grade 1 diastolic dysfunction, no right heart failure - PTOT evaluation-> no PT follow-up needed - cortisol, vitamin B12, folate level all within normal limits - MRI of the brain negative for any posterior circulation ischemia - Still orthostatic, unfortunately cannot add midodrine or Florinef due to significant peripheral edema. TED hoses placed, hesitant to give her Lasix due to orthostasis. Cardiology consult called and discussed in detail with Dr. Acie Fredrickson. Ordered CT abd/pelvis with IV contrast to assess IVC and can add CTA chest to assess any progression of PE. However due to lack of IV access, both studies were pending, PICC line placed today - Pulmonology consult also called regarding recommendations for possible anticoagulation, discussed with Dr. Alva Garnet. If patient has progression of PE, ? IV heparin drip challenge and if patient does not have any bleeding for at least 24 hours, we can try Coumadin or lower dose eliquis  Active Problems:  recent Pulmonary embolism: Likely from tamoxifen Prior CT angiogram of the chest on 4/8 showed moderate bordering of bilateral pulmonary embolism with right heart strain. Patient was placed on heparin drip and transitioned to  xarelto, however patient was noticed to have increased periorbital ecchymosis hence Xarelto was discontinued. IVC filter was placed on 4/13. Per pulmonology, once stable, IVC will be retrieved and patient will be started on xarelto again.  Acute kidney injury:  Most likely due to dehydration secondary to vomiting-> resolved  - Creatinine 1.73 at the time of admission, renal ultrasound negative for acute obstruction or hydronephrosis. - Lisinopril discontinued   GERD: -Continue Pepcid  Hyperlipidemia: - Patient not on any statins, check lipid panel  Nausea and vomiting: Etiology is not clear, resolved - Lipase is 82 however patient does not have any  symptoms of acute pancreatitis. She has  no abdominal pain, tolerating regular diet   Right breast cancer:  status post XRT and surgery. Patient used to take tamoxifen, which was discontinued because of DVT and pulmonary embolism. -follow up with Dr. Lindi Adie  Code Status: Full code   Family Communication: Discussed in detail with the patient, all imaging results, lab results explained to the patient at the bedside   Disposition Plan: Pending further studies  Time Spent in minutes  25 minutes  Procedures  CT head MRI of the brain 2-D echocardiogram  Consults   Cardiology Pulmonology  DVT Prophylaxis  heparin subcutaneous  Medications  Scheduled Meds: . acidophilus  1 capsule Oral Daily  . calcium carbonate  1,500 mg of elemental calcium Oral Q breakfast  . cholecalciferol  5,000 Units Oral Daily  . famotidine  20 mg Oral Daily  . heparin  5,000 Units Subcutaneous 3 times per day  . iron polysaccharides  150 mg Oral Daily  . multivitamin with minerals  1 tablet Oral Daily  . omega-3 acid ethyl esters  1 g Oral Daily  . sodium chloride  3 mL Intravenous Q12H  . vitamin C  250 mg Oral Daily  . vitamin E  100 Units Oral Daily   Continuous Infusions:   PRN Meds:.alum & mag hydroxide-simeth, morphine injection, ondansetron (ZOFRAN) IV, ondansetron **OR** ondansetron (ZOFRAN) IV, sodium chloride   Antibiotics   Anti-infectives    None        Subjective:   [name] was seen and examined today. Patient denies chest pain, shortness of breath, abdominal pain, N/V/D/C, new weakness, numbess, tingling.  Denies any nausea, vomiting, lower extremity edema +  Objective:   Blood pressure 139/73, pulse 105, temperature 98.9 F (37.2 C), temperature source Oral, resp. rate 17, height 5\' 5"  (1.651 m), weight 90.4 kg (199 lb 4.7 oz), SpO2 95 %.  Wt Readings from Last 3 Encounters:  10/11/14 90.4 kg (199 lb 4.7 oz)  10/02/14 95.6 kg (210 lb 12.2 oz)  05/08/14 93.849  kg (206 lb 14.4 oz)     Intake/Output Summary (Last 24 hours) at 10/14/14 1213 Last data filed at 10/14/14 0937  Gross per 24 hour  Intake    960 ml  Output    250 ml  Net    710 ml    Exam  General: Alert and oriented x 3, NAD  HEENT:  PERRLA, EOMI, Anicteic Sclera, mucous membranes moist.   Neck: Supple, no JVD  CVS: S1 S2 clear, no MRG  Respiratory: Clear to auscultation bilaterally  Abdomen: Soft, NT, ND, NBS  Ext: no cyanosis clubbing, TED hose  Neuro: AAOx3, Cr N's II- XII. Strength 5/5 upper and lower extremities bilaterally  Skin: No rashes  Psych: Normal affect and demeanor, alert and oriented x3    Data Review   Micro Results Recent Results (  from the past 240 hour(s))  Urine culture     Status: None   Collection Time: 10/11/14  8:34 AM  Result Value Ref Range Status   Specimen Description URINE, CLEAN CATCH  Final   Special Requests NONE  Final   Colony Count   Final    >=100,000 COLONIES/ML Performed at [facility]    Culture   Final    Multiple bacterial morphotypes present, none predominant. Suggest appropriate recollection if clinically indicated. Performed at [facility]    Report Status 10/13/2014 FINAL  Final    Radiology Reports Dg Chest 2 View  09/27/2014   CLINICAL DATA:  Shortness of breath for a few days.  EXAM: CHEST  2 VIEW  COMPARISON:  PA and lateral chest 07/26/2013.  FINDINGS: The lungs are clear. Heart size is normal. No pneumothorax or pleural effusion. Small hiatal hernia is noted. Calcified breast implants seen on the prior study are no longer visualized.  IMPRESSION: No acute disease.  Small hiatal hernia.   Electronically Signed   By: Inge Rise M.D.   On: 09/27/2014 11:10   Ct Head Wo Contrast  10/10/2014   CLINICAL DATA:  Admitted for multiple blood clots. Syncopal episode today at home.  EXAM: CT HEAD WITHOUT CONTRAST  TECHNIQUE: Contiguous axial images were obtained from the base of the skull  through the vertex without intravenous contrast.  COMPARISON:  09/28/2014; 12/27/2007  FINDINGS: The gray-white differentiation is maintained. No CT evidence of acute large territory infarct. No intraparenchymal or extra-axial mass or hemorrhage. Unchanged size and configuration of the ventricles and basilar cisterns. No midline shift. There is scattered polypoid mucosal thickening of the right posterior ethmoidal air cells as well as the left sphenoid sinus. The remaining paranasal sinuses and mastoid air cells appear normally aerated. No air-fluid levels. Old right-sided nasal bone fracture (representative image 14, series 3). No acutely displaced calvarial fracture. Regional soft tissues appear normal.  IMPRESSION: Negative noncontrast head CT.   Electronically Signed   By: Sandi Mariscal M.D.   On: 10/10/2014 22:50   Ct Head Wo Contrast  09/28/2014   CLINICAL DATA:  Acute pulmonary embolism. No history of breast cancer.  EXAM: CT HEAD WITHOUT CONTRAST  TECHNIQUE: Contiguous axial images were obtained from the base of the skull through the vertex without intravenous contrast.  COMPARISON:  Head CT 12/27/2007  FINDINGS: All No acute intracranial hemorrhage. No focal mass lesion. No CT evidence of acute infarction. No midline shift or mass effect. No hydrocephalus. Basilar cisterns are patent. Paranasal sinuses and mastoid air cells are clear. Mild periventricular white matter hypodensities.  IMPRESSION: No acute intracranial findings.  Mild white matter microvascular change.   Electronically Signed   By: Suzy Bouchard M.D.   On: 09/28/2014 11:09   Ct Angio Chest Pe W/cm &/or Wo Cm  09/27/2014   CLINICAL DATA:  73YOF today pt was on the toilet and passed out. Small hematoma to head. [facility] reported that pt was orthostatic and there was a loss of radial pulse while standing. History of right breast cancer.  EXAM: CT ANGIOGRAPHY CHEST WITH CONTRAST  TECHNIQUE: Multidetector CT imaging of the chest was performed  using the standard protocol during bolus administration of intravenous contrast. Multiplanar CT image reconstructions and MIPs were obtained to evaluate the vascular anatomy.  CONTRAST:  52mL OMNIPAQUE IOHEXOL 350 MG/ML SOLN  COMPARISON:  CT abdomen 08/09/2013 and chest x-ray today.  FINDINGS: Lungs are clear.  Airways are normal.  Heart is normal in size. Pulmonary arterial system is well opacified and demonstrates moderate burden of emboli over the origin of the upper and lower lobar pulmonary arteries bilaterally as well as the proximal lingular and right middle lobe pulmonary arteries. There is evidence of right heart strain with elevated RV/LV ratio of 50.5 mm/27.2 mm = 1.9 (normal less than 0.9).  There is a moderate size hiatal hernia. Remaining mediastinal structures are unremarkable. Bilateral breast implants present.  Images through the upper abdomen demonstrate a 3 mm calcification over the upper pole left kidney. Mild degenerative change of the spine. No acute fractures.  Review of the MIP images confirms the above findings.  IMPRESSION: Moderate burden of bilateral pulmonary emboli. Positive for acute PE with CT evidence of right heart strain (RV/LV Ratio = 1.9) consistent with at least submassive (intermediate risk) PE. The presence of right heart strain has been associated with an increased risk of morbidity and mortality. Please activate Code PE by paging [phone].  Moderate size hiatal hernia.  3 mm calcification over the upper pole left renal cortex.  Critical Value/emergent results were called by telephone at the time of interpretation on 09/27/2014 at 1:33 pm to Dr. Blanchie Dessert , who verbally acknowledged these results.   Electronically Signed   By: Marin Olp M.D.   On: 09/27/2014 13:34   Mr Brain Wo Contrast  10/12/2014   CLINICAL DATA:  Episode of feeling clammy, diaphoretic, cold, dizzy, and lightheaded beginning 4 hours ago. History of right lower extremity DVT and pulmonary  embolism recently. IVC filter placement. Syncopal episode. Ortho static dizziness.  EXAM: MRI HEAD WITHOUT CONTRAST  TECHNIQUE: Multiplanar, multiecho pulse sequences of the brain and surrounding structures were obtained without intravenous contrast.  COMPARISON:  CT head without contrast 10/10/2014  FINDINGS: No acute infarct, hemorrhage, or mass lesion is present. Mild atrophy is present. Moderate periventricular and scattered subcortical T2 changes are noted bilaterally. The ventricles are proportionate to the degree of atrophy. Mild white matter changes extend into the brainstem.  The globes and orbits are intact. Mild mucosal thickening is present within the ethmoid air cells bilaterally. There are no fluid levels. The remaining paranasal sinuses are clear. There is fluid in the left mastoid air cells. No obstructing nasopharyngeal lesion is present.  IMPRESSION: 1. No acute intracranial abnormality. 2. Moderate periventricular and scattered subcortical T2 changes bilaterally. These are nonspecific, but likely reflects sequela of chronic microvascular ischemia. 3. Left mastoid effusion. No obstructing nasopharyngeal lesion is present.   Electronically Signed   By: San Morelle M.D.   On: 10/12/2014 19:14   Ir Angiogram Pulmonary Bilateral Selective  09/29/2014   CLINICAL DATA:  Pulmonary thromboembolism.  Right heart strain.  EXAM: BILATERAL PULMONARY ARTERIOGRAPHY; ADDITIONAL ARTERIOGRAPHY; IR ULTRASOUND GUIDANCE VASC ACCESS RIGHT; IR INFUSION THROMBOL ARTERIAL INITIAL (MS)  FLUOROSCOPY TIME:  6 minutes 24 seconds.  MEDICATIONS AND MEDICAL HISTORY: Versed 1 mg, Fentanyl 50 mcg.  Additional Medications: None.  ANESTHESIA/SEDATION: Moderate sedation time: 30 minutes  CONTRAST:  50 cc Omnipaque 300  PROCEDURE: The procedure, risks, benefits, and alternatives were explained to the patient. Questions regarding the procedure were encouraged and answered. The patient understands and consents to the  procedure.  The right groin was prepped with Betadine in a sterile fashion, and a sterile drape was applied covering the operative field. A sterile gown and sterile gloves were used for the procedure.  Under sonographic guidance, a micropuncture needle was placed into the right common femoral vein and  removed over a 018 wire which was up sized to a [place]. A 7 French sheath was inserted. The identical procedure was performed in the same vein within additional 7 French sheath. A JB 1 catheter was advanced over a Bentson wire into the left pulmonary artery. Pressure and angiography was performed. Left pulmonary artery pressure was measured at 56/23 with a mean of 36 mm Hg. The catheter was removed over a longer Rosen wire. The identical procedure was performed into the right pulmonary artery through the other sheath. Right pulmonary artery pressure was measured at 50/23 with a mean of 31 mm Hg. Right and left 18 and 12 cm EKOS infusion catheters were then advanced into the right and left pulmonary arteries. TPA infusion was instituted.  FINDINGS: Imaging confirms right and left pulmonary artery catheter position in the descending branches. Contrast injected confirms positioning and pulmonary thromboembolism.  COMPLICATIONS: None  IMPRESSION: Successful he close bilateral pulmonary artery thrombolytic infusion.   Electronically Signed   By: Marybelle Killings M.D.   On: 09/29/2014 08:46   Ir Angiogram Selective Each Additional Vessel  09/29/2014   CLINICAL DATA:  Pulmonary thromboembolism.  Right heart strain.  EXAM: BILATERAL PULMONARY ARTERIOGRAPHY; ADDITIONAL ARTERIOGRAPHY; IR ULTRASOUND GUIDANCE VASC ACCESS RIGHT; IR INFUSION THROMBOL ARTERIAL INITIAL (MS)  FLUOROSCOPY TIME:  6 minutes 24 seconds.  MEDICATIONS AND MEDICAL HISTORY: Versed 1 mg, Fentanyl 50 mcg.  Additional Medications: None.  ANESTHESIA/SEDATION: Moderate sedation time: 30 minutes  CONTRAST:  50 cc Omnipaque 300  PROCEDURE: The procedure, risks,  benefits, and alternatives were explained to the patient. Questions regarding the procedure were encouraged and answered. The patient understands and consents to the procedure.  The right groin was prepped with Betadine in a sterile fashion, and a sterile drape was applied covering the operative field. A sterile gown and sterile gloves were used for the procedure.  Under sonographic guidance, a micropuncture needle was placed into the right common femoral vein and removed over a 018 wire which was up sized to a Bentson. A 7 French sheath was inserted. The identical procedure was performed in the same vein within additional 7 French sheath. A JB 1 catheter was advanced over a Bentson wire into the left pulmonary artery. Pressure and angiography was performed. Left pulmonary artery pressure was measured at 56/23 with a mean of 36 mm Hg. The catheter was removed over a longer Rosen wire. The identical procedure was performed into the right pulmonary artery through the other sheath. Right pulmonary artery pressure was measured at 50/23 with a mean of 31 mm Hg. Right and left 18 and 12 cm EKOS infusion catheters were then advanced into the right and left pulmonary arteries. TPA infusion was instituted.  FINDINGS: Imaging confirms right and left pulmonary artery catheter position in the descending branches. Contrast injected confirms positioning and pulmonary thromboembolism.  COMPLICATIONS: None  IMPRESSION: Successful he close bilateral pulmonary artery thrombolytic infusion.   Electronically Signed   By: Marybelle Killings M.D.   On: 09/29/2014 08:46   Ir Angiogram Selective Each Additional Vessel  09/29/2014   CLINICAL DATA:  Pulmonary thromboembolism.  Right heart strain.  EXAM: BILATERAL PULMONARY ARTERIOGRAPHY; ADDITIONAL ARTERIOGRAPHY; IR ULTRASOUND GUIDANCE VASC ACCESS RIGHT; IR INFUSION THROMBOL ARTERIAL INITIAL (MS)  FLUOROSCOPY TIME:  6 minutes 24 seconds.  MEDICATIONS AND MEDICAL HISTORY: Versed 1 mg, Fentanyl  50 mcg.  Additional Medications: None.  ANESTHESIA/SEDATION: Moderate sedation time: 30 minutes  CONTRAST:  50 cc Omnipaque 300  PROCEDURE: The procedure, risks, benefits,  and alternatives were explained to the patient. Questions regarding the procedure were encouraged and answered. The patient understands and consents to the procedure.  The right groin was prepped with Betadine in a sterile fashion, and a sterile drape was applied covering the operative field. A sterile gown and sterile gloves were used for the procedure.  Under sonographic guidance, a micropuncture needle was placed into the right common femoral vein and removed over a 018 wire which was up sized to a Bentson. A 7 French sheath was inserted. The identical procedure was performed in the same vein within additional 7 French sheath. A JB 1 catheter was advanced over a Bentson wire into the left pulmonary artery. Pressure and angiography was performed. Left pulmonary artery pressure was measured at 56/23 with a mean of 36 mm Hg. The catheter was removed over a longer Rosen wire. The identical procedure was performed into the right pulmonary artery through the other sheath. Right pulmonary artery pressure was measured at 50/23 with a mean of 31 mm Hg. Right and left 18 and 12 cm EKOS infusion catheters were then advanced into the right and left pulmonary arteries. TPA infusion was instituted.  FINDINGS: Imaging confirms right and left pulmonary artery catheter position in the descending branches. Contrast injected confirms positioning and pulmonary thromboembolism.  COMPLICATIONS: None  IMPRESSION: Successful he close bilateral pulmonary artery thrombolytic infusion.   Electronically Signed   By: Marybelle Killings M.D.   On: 09/29/2014 08:46   Ir Ivc Filter Plmt / S&i /img Guid/mod Sed  10/02/2014   CLINICAL DATA:  Pulmonary thromboembolism. Peri-orbitall hemorrhage after anti coagulation.  EXAM: IVC FILTER,INFERIOR VENA CAVOGRAM  FLUOROSCOPY TIME:  1  minutes and 12 seconds.  MEDICATIONS AND MEDICAL HISTORY: Versed 1 mg, Fentanyl 50 mcg.  Additional Medications: None.  ANESTHESIA/SEDATION: Moderate sedation time: 15 minutes  CONTRAST:  40 cc Omnipaque 300  PROCEDURE: The procedure, risks, benefits, and alternatives were explained to the patient. Questions regarding the procedure were encouraged and answered. The patient understands and consents to the procedure.  The right neck was prepped with Betadine in a sterile fashion, and a sterile drape was applied covering the operative field. A sterile gown and sterile gloves were used for the procedure.  The Betadine vein was noted to be patent initially with ultrasound. Under sonographic guidance, a micropuncture needle was inserted into the right internal jugular vein (Ultrasound image documentation was performed). It was removed over an 018 wire which was upsized to a [place]. The sheath was inserted over the wire and into the IVC. IVC venography was performed.  The temporary filter was then deployed in the infrarenal IVC. The sheath was removed and hemostasis was achieved with direct pressure.  FINDINGS: IVC venography confirms renal vein inflow at L1. No IVC thrombus or venous anomaly.  The image demonstrates placement of an IVC filter with its tip at the L1-2 disc.  COMPLICATIONS: None  IMPRESSION: Successful infrarenal IVC filter placement. This is a temporary filter. It can be removed or remain in place to become permanent.   Electronically Signed   By: Marybelle Killings M.D.   On: 10/02/2014 13:21   US Renal  10/11/2014   CLINICAL DATA:  Acute renal insufficiency  EXAM: RENAL/URINARY TRACT ULTRASOUND COMPLETE  COMPARISON:  CT scan 2/ 19/ 15  FINDINGS: Right Kidney:  Length: 9 mm. Echogenicity within normal limits. No mass or hydronephrosis visualized. Mild renal cortical thinning probable due to atrophy.  Left Kidney:  Length: 10.7 cm. Echogenicity  within normal limits. No mass or hydronephrosis visualized. Mild  renal cortical thinning probable due to atrophy.  Bladder:  Appears normal for degree of bladder distention.  IMPRESSION: 1. No hydronephrosis. No renal calculi. Bilateral mild renal cortical thinning probable due to atrophy. Unremarkable urinary bladder.   Electronically Signed   By: Lahoma Crocker M.D.   On: 10/11/2014 09:46   Ir US Guide Vasc Access Right  09/29/2014   CLINICAL DATA:  Pulmonary thromboembolism.  Right heart strain.  EXAM: BILATERAL PULMONARY ARTERIOGRAPHY; ADDITIONAL ARTERIOGRAPHY; IR ULTRASOUND GUIDANCE VASC ACCESS RIGHT; IR INFUSION THROMBOL ARTERIAL INITIAL (MS)  FLUOROSCOPY TIME:  6 minutes 24 seconds.  MEDICATIONS AND MEDICAL HISTORY: Versed 1 mg, Fentanyl 50 mcg.  Additional Medications: None.  ANESTHESIA/SEDATION: Moderate sedation time: 30 minutes  CONTRAST:  50 cc Omnipaque 300  PROCEDURE: The procedure, risks, benefits, and alternatives were explained to the patient. Questions regarding the procedure were encouraged and answered. The patient understands and consents to the procedure.  The right groin was prepped with Betadine in a sterile fashion, and a sterile drape was applied covering the operative field. A sterile gown and sterile gloves were used for the procedure.  Under sonographic guidance, a micropuncture needle was placed into the right common femoral vein and removed over a 018 wire which was up sized to a Bentson. A 7 French sheath was inserted. The identical procedure was performed in the same vein within additional 7 French sheath. A JB 1 catheter was advanced over a Bentson wire into the left pulmonary artery. Pressure and angiography was performed. Left pulmonary artery pressure was measured at 56/23 with a mean of 36 mm Hg. The catheter was removed over a longer Rosen wire. The identical procedure was performed into the right pulmonary artery through the other sheath. Right pulmonary artery pressure was measured at 50/23 with a mean of 31 mm Hg. Right and left 18 and 12  cm EKOS infusion catheters were then advanced into the right and left pulmonary arteries. TPA infusion was instituted.  FINDINGS: Imaging confirms right and left pulmonary artery catheter position in the descending branches. Contrast injected confirms positioning and pulmonary thromboembolism.  COMPLICATIONS: None  IMPRESSION: Successful he close bilateral pulmonary artery thrombolytic infusion.   Electronically Signed   By: Marybelle Killings M.D.   On: 09/29/2014 08:46   Ir Infusion Thrombol Arterial Initial (ms)  09/29/2014   CLINICAL DATA:  Pulmonary thromboembolism.  Right heart strain.  EXAM: BILATERAL PULMONARY ARTERIOGRAPHY; ADDITIONAL ARTERIOGRAPHY; IR ULTRASOUND GUIDANCE VASC ACCESS RIGHT; IR INFUSION THROMBOL ARTERIAL INITIAL (MS)  FLUOROSCOPY TIME:  6 minutes 24 seconds.  MEDICATIONS AND MEDICAL HISTORY: Versed 1 mg, Fentanyl 50 mcg.  Additional Medications: None.  ANESTHESIA/SEDATION: Moderate sedation time: 30 minutes  CONTRAST:  50 cc Omnipaque 300  PROCEDURE: The procedure, risks, benefits, and alternatives were explained to the patient. Questions regarding the procedure were encouraged and answered. The patient understands and consents to the procedure.  The right groin was prepped with Betadine in a sterile fashion, and a sterile drape was applied covering the operative field. A sterile gown and sterile gloves were used for the procedure.  Under sonographic guidance, a micropuncture needle was placed into the right common femoral vein and removed over a 018 wire which was up sized to a Bentson. A 7 French sheath was inserted. The identical procedure was performed in the same vein within additional 7 French sheath. A JB 1 catheter was advanced over a Bentson wire into the left pulmonary  artery. Pressure and angiography was performed. Left pulmonary artery pressure was measured at 56/23 with a mean of 36 mm Hg. The catheter was removed over a longer Rosen wire. The identical procedure was performed into  the right pulmonary artery through the other sheath. Right pulmonary artery pressure was measured at 50/23 with a mean of 31 mm Hg. Right and left 18 and 12 cm EKOS infusion catheters were then advanced into the right and left pulmonary arteries. TPA infusion was instituted.  FINDINGS: Imaging confirms right and left pulmonary artery catheter position in the descending branches. Contrast injected confirms positioning and pulmonary thromboembolism.  COMPLICATIONS: None  IMPRESSION: Successful he close bilateral pulmonary artery thrombolytic infusion.   Electronically Signed   By: Marybelle Killings M.D.   On: 09/29/2014 08:46   Ir Jacolyn Reedy F/u Eval Art/ven Final Day (ms)  09/29/2014   CLINICAL DATA:  Follow-up EKOS pulmonary artery thrombolytic therapy.  EXAM: IR THROMB F/U EVAL ART/VEN FINAL DAY  FLUOROSCOPY TIME:  20 seconds.  MEDICATIONS AND MEDICAL HISTORY: None  ANESTHESIA/SEDATION: None  CONTRAST:  None  PROCEDURE: The procedure, risks, benefits, and alternatives were explained to the patient. Questions regarding the procedure were encouraged and answered. The patient understands and consents to the procedure.  Right and left pulmonary artery pressures were obtained. Right pulmonary artery pressure is 35/27 with a mean of 31 mm Hg. That of the left is 34/29 with a mean of 31 mm Hg. The catheters and sheaths were removed. Hemostasis was achieved with direct pressure and VPAD.  FINDINGS: Fluoroscopic imaging confirms stable position of the right and left pulmonary artery EKOS catheters.  COMPLICATIONS: None  IMPRESSION: Successful conclusion of bilateral pulmonary artery EKOS thrombo lytic therapy. Peak systolic pressures have significantly improved from 56 to 35 mm Hg in the right pulmonary artery and 50 to 34 mm Hg in the left pulmonary artery.   Electronically Signed   By: Marybelle Killings M.D.   On: 09/29/2014 09:21    CBC  Recent Labs Lab 10/10/14 2052 10/11/14 1055  WBC 11.3* 8.2  HGB 11.5* 10.2*  HCT  38.5 33.8*  PLT 257 199  MCV 84.6 85.4  MCH 25.3* 25.8*  MCHC 29.9* 30.2  RDW 22.4* 22.7*  LYMPHSABS 0.6*  --   MONOABS 0.6  --   EOSABS 0.0  --   BASOSABS 0.0  --     Chemistries   Recent Labs Lab 10/10/14 2052 10/11/14 1055 10/12/14 0530  NA 138 140 138  K 4.3 3.7 4.4  CL 102 108 105  CO2 23 22 23   GLUCOSE 172* 125* 121*  BUN 16 18 15   CREATININE 1.73* 1.10 0.88  CALCIUM 9.8 8.9 9.5  AST 30 23  --   ALT 19 15  --   ALKPHOS 68 55  --   BILITOT 0.6 0.6  --    ------------------------------------------------------------------------------------------------------------------ estimated creatinine clearance is 64.2 mL/min (by C-G formula based on Cr of 0.88). ------------------------------------------------------------------------------------------------------------------ No results for input(s): HGBA1C in the last 72 hours. ------------------------------------------------------------------------------------------------------------------ No results for input(s): CHOL, HDL, LDLCALC, TRIG, CHOLHDL, LDLDIRECT in the last 72 hours. ------------------------------------------------------------------------------------------------------------------ No results for input(s): TSH, T4TOTAL, T3FREE, THYROIDAB in the last 72 hours.  Invalid input(s): FREET3 ------------------------------------------------------------------------------------------------------------------  Recent Labs  10/12/14 1232  VITAMINB12 1677*  FOLATE 17.8    Coagulation profile  Recent Labs Lab 10/10/14 2052  INR 1.09    No results for input(s): DDIMER in the last 72 hours.  Cardiac Enzymes  Recent Labs Lab  10/12/14 1232 10/12/14 1645 10/12/14 2330  TROPONINI <0.03 <0.03 <0.03   ------------------------------------------------------------------------------------------------------------------ Invalid input(s): POCBNP  No results for input(s): GLUCAP in the last 72 hours.   Emmamarie Kluender  M.D. [facility] 10/14/2014, 12:13 PM  Pager: [phone]   Between 7am to 7pm - call Pager - [phone]  After 7pm go to [email or web address] - password [email or web address]  Call night coverage person covering after 7pm

## 2014-10-14 NOTE — Progress Notes (Signed)
Spoke with Dr. Tana Coast regarding restriction to right arm and PICC insertion.   We can use left arm to insert PICC.

## 2014-10-14 NOTE — Consult Note (Addendum)
Name: [name] MRN: [id number] DOB: 05-04-1941    ADMISSION DATE:  10/10/2014 CONSULTATION DATE:  10/14/2014  REFERRING MD :  Dr. Tana Coast  CHIEF COMPLAINT:  syncope  BRIEF PATIENT DESCRIPTION: 74 year old female with recent history of PE s/p EKOS and IVC filter placement. Presented 4/21 with c/o syncope, symptoms c/w IVC syndrome. PCCM consult 4/25  SIGNIFICANT EVENTS  4/09>>>EKOS 4/10>> 1uRBC transfusion 4/12 increased periorbital ecchymosis 4/13 IVC filter placed 4/21 admitted for syncope  STUDIES:  4/08 CT chest >> Moderate burden of bilateral pulmonary emboli. Positive for acute PE with CT evidence of right heart strain (RV/LV Ratio = 1.9).  4/08 Echo >> EF 75%, mod RV dilation with decreased systolic fx, PAS 61 mmHg, mod/severe TR 4/09 LE dopplers >>> acute DVT in rt popliteal vein, rt TB vein, and rt peroneal vein 4/21 CT head > No acute abnormalities 4/22 Renal US > No hydronephrosis. No renal calculi. Bilateral mild renal cortical thinning probable due to atrophy. 4/22 Echo > LVEF 60-65%, Grade 1 DD.  Improvement from prior study. 4/23 MRI brain > No acute intracranial abnormality.  HISTORY OF PRESENT ILLNESS:  74 year old female with PMH as below. She was admitted to [facility] ED 09/27/14 with bilateral PE and received thrombolysis via EKOS on 4/9. Heprain gtt was eventually transitioned to xarelto, however, she developed periorbital ecchymosis. Xarelto was D/c'd and IVF filter was placed. She was discharged to home 4/14. 4/21 she again presented to [facility] c/o orthostasis and syncope. She was admitted and treated initially with IVF, which resulted in massive lower extremity edema. Echocardiogram has normalized since prior admission. Based on relatively normal heart and kidney function there is concern that her IVC filter may be obstructing. PCCM asked to consult.   PAST MEDICAL HISTORY :   has a past medical history of Essential hypertension; Hypercholesterolemia; Vitamin D  deficiency; Fibromyalgia; Anemia; Depression; Complication of anesthesia (2009); GERD (gastroesophageal reflux disease); Diverticulosis; Hiatal hernia; Breast cancer; Iron deficiency anemia; S/P radiation therapy (09/24/2013-10/17/2013); Use of tamoxifen (Nolvadex); PONV (postoperative nausea and vomiting); Wears contact lenses; and History of pulmonary embolism (09/27/14).  has past surgical history that includes Appendectomy; Tubal ligation; Liposuction (1995); Facial cosmetic surgery (2009); Esophagogastroduodenoscopy (N/A, 07/05/2013); Colonoscopy (N/A, 07/05/2013); Tonsillectomy; Reconstruction of nose; Breast lumpectomy with needle localization and axillary sentinel lymph node bx (Right, 07/31/2013); Skin biopsy (Right, 07/31/2013); Breast implant removal (Right, 07/31/2013); Mass excision (N/A, 10/12/2013); Breast implant removal (Left, 06/25/2014); Breast enhancement surgery (Bilateral, 06/25/2014); Mastopexy (Bilateral, 06/25/2014); Vena cava filter placement (09/2014); and Augmentation mammaplasty. Prior to Admission medications   Medication Sig Start Date End Date Taking? Authorizing Provider  Ascorbic Acid (VITAMIN C) 100 MG tablet Take 100 mg by mouth daily.    Yes Historical Provider, MD  BIOTIN PO Take 1 tablet by mouth every morning.   Yes Historical Provider, MD  CALCIUM PO Take 1 capsule by mouth 2 (two) times daily. 1500mg  twice a day, chews.   Yes Historical Provider, MD  Cholecalciferol (VITAMIN D3) 5000 UNITS TABS Take 5,000 Units by mouth daily.   Yes Historical Provider, MD  famotidine (PEPCID) 10 MG tablet Take 10 mg by mouth daily.   Yes Historical Provider, MD  iron polysaccharides (NIFEREX) 150 MG capsule Take 150 mg by mouth daily.    Yes Historical Provider, MD  lisinopril (PRINIVIL,ZESTRIL) 20 MG tablet Take 20 mg by mouth daily.   Yes Historical Provider, MD  Multiple Vitamin (MULTIVITAMIN) tablet Take 1 tablet by mouth daily. gummies  Yes Historical Provider, MD  non-metallic deodorant  Jethro Poling) MISC Apply 1 application topically daily.    Yes Historical Provider, MD  Omega-3 Fatty Acids (FISH OIL) 1000 MG CAPS Take 1 capsule by mouth 2 (two) times daily.   Yes Historical Provider, MD  Probiotic Product ([place]) Take 1 capsule by mouth daily.   Yes Historical Provider, MD  vitamin E 100 UNIT capsule Take 100 Units by mouth daily.   Yes Historical Provider, MD  amLODipine (NORVASC) 5 MG tablet Take 1 tablet (5 mg total) by mouth daily. 10/12/14   Ripudeep Krystal Eaton, MD  promethazine (PHENERGAN) 12.5 MG tablet Take 1 tablet (12.5 mg total) by mouth every 6 (six) hours as needed for nausea or vomiting. 10/12/14   Ripudeep Krystal Eaton, MD  ranitidine (ZANTAC) 150 MG tablet Take 1 tablet (150 mg total) by mouth at bedtime. 07/05/13   Lafayette Dragon, MD   No Known Allergies  FAMILY HISTORY:  family history includes COPD in her mother; Stroke in her maternal grandmother; Tuberculosis in her father. There is no history of Colon cancer. SOCIAL HISTORY:  reports that she has never smoked. She has never used smokeless tobacco. She reports that she does not drink alcohol or use illicit drugs.  REVIEW OF SYSTEMS:   Bolds are positive  Constitutional: weight loss, gain, night sweats, Fevers, chills, fatigue .  HEENT: headaches, Sore throat, sneezing, nasal congestion, post nasal drip, Difficulty swallowing, Tooth/dental problems, visual complaints visual changes, ear ache CV:  chest pain, radiates: ,Orthopnea, PND, swelling in lower extremities, dizziness, palpitations, syncope.  GI  heartburn, indigestion, abdominal pain, nausea, vomiting, diarrhea, change in bowel habits, loss of appetite, bloody stools.  Resp: cough, productive: , hemoptysis, dyspnea, chest pain, pleuritic.  Skin: rash or itching or icterus GU: dysuria, change in color of urine, urgency or frequency. flank pain, hematuria  MS: BLE pain L > R when standing, BLE swelling. decreased range of motion  Psych: change in  mood or affect. depression or anxiety.  Neuro: difficulty with speech, weakness, numbness, ataxia    SUBJECTIVE:   VITAL SIGNS: Temp:  [98.4 F (36.9 C)-98.9 F (37.2 C)] 98.9 F (37.2 C) (04/25 0544) Pulse Rate:  [102-106] 105 (04/25 0544) Resp:  [17-20] 17 (04/25 0544) BP: (139-147)/(67-77) 139/73 mmHg (04/25 0544) SpO2:  [95 %-98 %] 95 % (04/25 0544)  PHYSICAL EXAMINATION: General:  74 year old female in NAD Neuro:  Alert, oriented, non-focal HEENT:  [place]/AT, No JVD noted, PERRL Cardiovascular:  RRR, no MRG Lungs:  Clear bilateral breath sounds. Somewhat diminished R Abdomen:  Soft, non-tender, non-distended Musculoskeletal:  BLE edema and pain R > L  Skin:  Grossly intact   Recent Labs Lab 10/10/14 2052 10/11/14 1055 10/12/14 0530  NA 138 140 138  K 4.3 3.7 4.4  CL 102 108 105  CO2 23 22 23   BUN 16 18 15   CREATININE 1.73* 1.10 0.88  GLUCOSE 172* 125* 121*    Recent Labs Lab 10/10/14 2052 10/11/14 1055  HGB 11.5* 10.2*  HCT 38.5 33.8*  WBC 11.3* 8.2  PLT 257 199   Mr Brain Wo Contrast  10/12/2014   CLINICAL DATA:  Episode of feeling clammy, diaphoretic, cold, dizzy, and lightheaded beginning 4 hours ago. History of right lower extremity DVT and pulmonary embolism recently. IVC filter placement. Syncopal episode. Ortho static dizziness.  EXAM: MRI HEAD WITHOUT CONTRAST  TECHNIQUE: Multiplanar, multiecho pulse sequences of the brain and surrounding structures were obtained without  intravenous contrast.  COMPARISON:  CT head without contrast 10/10/2014  FINDINGS: No acute infarct, hemorrhage, or mass lesion is present. Mild atrophy is present. Moderate periventricular and scattered subcortical T2 changes are noted bilaterally. The ventricles are proportionate to the degree of atrophy. Mild white matter changes extend into the brainstem.  The globes and orbits are intact. Mild mucosal thickening is present within the ethmoid air cells bilaterally. There are no fluid  levels. The remaining paranasal sinuses are clear. There is fluid in the left mastoid air cells. No obstructing nasopharyngeal lesion is present.  IMPRESSION: 1. No acute intracranial abnormality. 2. Moderate periventricular and scattered subcortical T2 changes bilaterally. These are nonspecific, but likely reflects sequela of chronic microvascular ischemia. 3. Left mastoid effusion. No obstructing nasopharyngeal lesion is present.   Electronically Signed   By: San Morelle M.D.   On: 10/12/2014 19:14    ASSESSMENT / PLAN:  Bilateral pulmonary emboli s/p EKOS, IVF filter placement 4/13 Syncope, possibly 2nd to IVF filter thrombosis Periorbital ecchymosis > improving - CT chest/abd/pelvis pending to assess for PE progression/IVC thrombosis - May need filter retrieved and anticoagulation re-started, could not tolerate Xarelto - Will follow up after CT - Consider repeat BLE dopplers to assess DVT  Per primary service: AKI > resolved Breast Ca > holding tamoxifen in setting PE/DVT  Georgann Housekeeper, AGACNP-BC [facility] Pager [phone] or [phone]  74 year old female with PMH of PE who developed periorbital ecchymosis and decision was made to place an IVC filter.  Patient now returns with bilateral lower ext edema.  The only differential here is that patient clotted her IVC filter resulting in IVC obstruction like syndrome.  I reviewed the CT myself and see near resolution of the PE.  Low flow state on CT.  PE: near complete resolution of PE.  Bleeding with anti-coagulation.  - May want to consider restarting anti-coagulation while in the hospital.  Lower ext edema: concern for IVC occlusion.  - U/S with dopplers of the IVC.  - If occluded then would recommend removal of IVC filter.  - If IVC filter is to be removed and patient tolerates anti-coagulation then continue with anticoagulation.  If IVC filter is removed and no tolerance of anti-coagulation is  noted then will need another filter.  - Repeat bilateral ext dopplers to evaluate for DVT.  - May want to involve vascular surgery.  Anasarca: not diffuse, only to the lower ext, concern for occlusion of the IVC.  - See above.  Patient seen and examined, agree with above note.  I dictated the care and orders written for this patient under my direction.  Rush Farmer, MD [phone]  10/14/2014 12:11 PM

## 2014-10-15 ENCOUNTER — Encounter (HOSPITAL_COMMUNITY): Payer: Self-pay

## 2014-10-15 ENCOUNTER — Inpatient Hospital Stay (HOSPITAL_COMMUNITY): Payer: Medicare Other

## 2014-10-15 DIAGNOSIS — R42 Dizziness and giddiness: Secondary | ICD-10-CM | POA: Diagnosis present

## 2014-10-15 DIAGNOSIS — I82409 Acute embolism and thrombosis of unspecified deep veins of unspecified lower extremity: Secondary | ICD-10-CM

## 2014-10-15 DIAGNOSIS — Z9889 Other specified postprocedural states: Secondary | ICD-10-CM

## 2014-10-15 DIAGNOSIS — I2699 Other pulmonary embolism without acute cor pulmonale: Secondary | ICD-10-CM | POA: Diagnosis present

## 2014-10-15 LAB — BASIC METABOLIC PANEL
ANION GAP: 11 (ref 5–15)
BUN: 11 mg/dL (ref 6–23)
CALCIUM: 8.5 mg/dL (ref 8.4–10.5)
CO2: 22 mmol/L (ref 19–32)
Chloride: 97 mmol/L (ref 96–112)
Creatinine, Ser: 0.79 mg/dL (ref 0.50–1.10)
GFR calc Af Amer: >90 mL/min (ref >90)
GFR calc non Af Amer: 81 mL/min — ABNORMAL LOW (ref >90)
Glucose, Bld: 146 mg/dL — ABNORMAL HIGH (ref 70–99)
Potassium: 4.2 mmol/L (ref 3.5–5.1)
Sodium: 130 mmol/L — ABNORMAL LOW (ref 135–145)

## 2014-10-15 LAB — CBC
HCT: 30.5 % — ABNORMAL LOW (ref 36.0–46.0)
Hemoglobin: 9.3 g/dL — ABNORMAL LOW (ref 12.0–15.0)
MCH: 24.9 pg — ABNORMAL LOW (ref 26.0–34.0)
MCHC: 30.5 g/dL (ref 30.0–36.0)
MCV: 81.8 fL (ref 78.0–100.0)
PLATELETS: 237 10*3/uL (ref 150–400)
RBC: 3.73 MIL/uL — ABNORMAL LOW (ref 3.87–5.11)
RDW: 21.8 % — AB (ref 11.5–15.5)
WBC: 12.4 10*3/uL — ABNORMAL HIGH (ref 4.0–10.5)

## 2014-10-15 LAB — HEPARIN LEVEL (UNFRACTIONATED): Heparin Unfractionated: 0.33 IU/mL (ref 0.30–0.70)

## 2014-10-15 MED ORDER — IOHEXOL 350 MG/ML SOLN
100.0000 mL | Freq: Once | INTRAVENOUS | Status: AC | PRN
  Administered 2014-10-15: 100 mL via INTRAVENOUS

## 2014-10-15 MED ORDER — HEPARIN (PORCINE) IN NACL 100-0.45 UNIT/ML-% IJ SOLN
1100.0000 [IU]/h | INTRAMUSCULAR | Status: DC
  Administered 2014-10-15: 1100 [IU]/h via INTRAVENOUS
  Filled 2014-10-15 (×2): qty 250

## 2014-10-15 NOTE — Progress Notes (Signed)
ANTICOAGULATION CONSULT NOTE - Follow Up Consult  Pharmacy Consult for Heparin  Indication: pulmonary embolus and DVT  No Known Allergies  Patient Measurements: Height: 5\' 5"  (165.1 cm) Weight: 199 lb 4.7 oz (90.4 kg) IBW/kg (Calculated) : 57 Vital Signs: Temp: 98.4 F (36.9 C) (04/26 1307) Temp Source: Oral (04/26 1307) Pulse Rate: 99 (04/26 1307)  Labs:  Recent Labs  10/15/14 2231  HGB 9.3*  HCT 30.5*  PLT 237  HEPARINUNFRC 0.33  CREATININE 0.79    Estimated Creatinine Clearance: 70.6 mL/min (by C-G formula based on Cr of 0.79).   Assessment: Therapeutic heparin level x 1, no bolus was given due to previous bleeding, Hgb is 9.3, other labs as above, IVC filter in place.  Goal of Therapy:  Heparin level 0.3-0.7 units/ml Monitor platelets by anticoagulation protocol: Yes   Plan:  -Continue heparin at 1100 units/hr -HL with AM labs -Daily CBC/HL -Monitor for bleeding, trend Hgb  Narda Bonds 10/15/2014,11:42 PM

## 2014-10-15 NOTE — Progress Notes (Signed)
[facility]                                                                              Patient Demographics  [name], is a 74 y.o. female, DOB - Jul 14, 1940, OQH:[id number]  Admit date - 10/10/2014   Admitting Physician Ivor Costa, MD  Outpatient Primary MD for the patient is Osborne Casco, MD  LOS - 5   Chief Complaint  Patient presents with  . Loss of Consciousness       Brief HPI   Patient is a 74 year old female with hypertension, hyperlipidemia, GERD, right breast cancer (post status of surgery and radiation therapy), history of pulmonary embolism (with R heart straining by 2D echo on 09/27/14), history of right lower leg DVT, hx of retinal bleeding, recent IVC filter placement on 10/02/14, was entered from home with a syncopal episode. Patient reported that about about 3 PM, she started feeling clammy, sweating and cold, dizzy and lightheaded. She felt that she was going to pass out, but did not. She was doing okay until 6 PM, when she vomited twice, denied any hematemesis and then subsequently had a syncopal episode. She strongly denied having seizure and unilateral weakness. No chest pain. She has mild shortness of breath due to pulmonary embolism, which is at her baseline. She still has mild swelling in the R lower leg due to DVT.  In ED, patient was found to have AKI with Cr 1.73, negative troponin, BNP 66, normal temperature, mild tachycardia, WBC 11.3, INR1.09. CT head is negative for acute abnormalities. EKG showed tachycardia and low voltage, no ischemic change. Patient was admitted to inpatient for further evaluation and treatment  4/23: Patient complaining of dizziness, orthostatic vitals positive  Assessment & Plan    Principal Problem:  Recurrent Syncope: Possibly due to low flow state from a right heart strain with bilateral pulmonary embolism, ? IVC obstruction, acute bilateral DVT. Patient had recent bilateral PE and had received  thrombolysis via EKOS on 4/9. Patient however did not tolerate oral anticoagulation and had developed periorbital ecchymosis. - CT head negative for acute abnormalities, no focal neurological deficits - 2-D echo showed EF of 54-65%, grade 1 diastolic dysfunction, no right heart failure - PTOT evaluation-> no PT follow-up needed - cortisol, vitamin B12, folate level all within normal limits - MRI of the brain negative for any posterior circulation ischemia - Cardiology was consulted, multiple imagings done. Duplex of the lower extremities showed acute DVT bilateral lower extremities. CT angiogram of the chest showed near resolution of pulmonary embolism. However CT abdomen showed a thrombosis distal to IVC. - Seen by Dr. Chase Caller and myself together today, pulmonology recommended starting patient on IV heparin drip 3-5 days as inpatient, if remains stable with no bleeding, then start NOAC (eliquis) prior to the discharge. Also do not remove IVC filter now as patient has clots distal to IVC which can dislodge and cause further pulmonary embolism.   Active Problems:  recent Pulmonary embolism: Likely from tamoxifen - please refer to #1   Acute kidney injury:  Most likely due to dehydration secondary to vomiting-> resolved  - Creatinine 1.73 at the  time of admission, renal ultrasound negative for acute obstruction or hydronephrosis. - Lisinopril discontinued.    GERD: -Continue Pepcid  Hyperlipidemia: - Patient not on any statins  Nausea and vomiting: Etiology is not clear, resolved - Lipase is 82 however patient does not have any symptoms of acute pancreatitis. She has  no abdominal pain, tolerating regular diet   Right breast cancer:  status post XRT and surgery. Patient used to take tamoxifen, which was discontinued because of DVT and pulmonary embolism. -follow up with Dr. Lindi Adie  Code Status: Full code   Family Communication: Discussed in detail with the patient, all imaging  results, lab results explained to the patient and husband at the bedside   Disposition Plan: Will need to be inpatient for at least 3-4 more days   Time Spent in minutes  25 minutes  Procedures  CT head MRI of the brain 2-D echocardiogram  Consults   Cardiology Pulmonology  DVT Prophylaxis  heparin subcutaneous  Medications  Scheduled Meds: . acidophilus  1 capsule Oral Daily  . calcium carbonate  1,500 mg of elemental calcium Oral Q breakfast  . cholecalciferol  5,000 Units Oral Daily  . famotidine  20 mg Oral Daily  . iron polysaccharides  150 mg Oral Daily  . multivitamin with minerals  1 tablet Oral Daily  . omega-3 acid ethyl esters  1 g Oral Daily  . sodium chloride  3 mL Intravenous Q12H  . vitamin C  250 mg Oral Daily  . vitamin E  100 Units Oral Daily   Continuous Infusions: . heparin 1,100 Units/hr (10/15/14 1207)   PRN Meds:.alum & mag hydroxide-simeth, morphine injection, ondansetron (ZOFRAN) IV, ondansetron **OR** ondansetron (ZOFRAN) IV, sodium chloride   Antibiotics   Anti-infectives    None        Subjective:   [name] was seen and examined today. Patient denies chest pain, shortness of breath, abdominal pain, N/V/D/C, new weakness, numbess, tingling.  Denies any nausea, vomiting.  No fevers or chills or any productive cough, feels weak  Objective:   Blood pressure 139/73, pulse 99, temperature 98.4 F (36.9 C), temperature source Oral, resp. rate 16, height 5\' 5"  (1.651 m), weight 90.4 kg (199 lb 4.7 oz), SpO2 94 %.  Wt Readings from Last 3 Encounters:  10/11/14 90.4 kg (199 lb 4.7 oz)  10/02/14 95.6 kg (210 lb 12.2 oz)  05/08/14 93.849 kg (206 lb 14.4 oz)     Intake/Output Summary (Last 24 hours) at 10/15/14 1418 Last data filed at 10/15/14 1008  Gross per 24 hour  Intake    480 ml  Output    490 ml  Net    -10 ml    Exam  General: Alert and oriented x 3, NAD  HEENT:  PERRLA, EOMI, Anicteic Sclera  Neck: Supple,  no JVD  CVS: S1 S2 clear, no MRG  Respiratory:  CTA B  Abdomen: Soft, NT, ND, NBS  Ext: no cyanosis clubbing, TED hose bilaterally   Neuro: AAOx3, Cr N's II- XII. Strength 5/5 upper and lower extremities bilaterally  Skin: No rashes  Psych: Normal affect and demeanor, alert and oriented x3    Data Review   Micro Results Recent Results (from the past 240 hour(s))  Urine culture     Status: None   Collection Time: 10/11/14  8:34 AM  Result Value Ref Range Status   Specimen Description URINE, CLEAN CATCH  Final   Special Requests NONE  Final   Colony Count  Final    >=100,000 COLONIES/ML Performed at [facility]    Culture   Final    Multiple bacterial morphotypes present, none predominant. Suggest appropriate recollection if clinically indicated. Performed at [facility]    Report Status 10/13/2014 FINAL  Final    Radiology Reports Dg Chest 2 View  09/27/2014   CLINICAL DATA:  Shortness of breath for a few days.  EXAM: CHEST  2 VIEW  COMPARISON:  PA and lateral chest 07/26/2013.  FINDINGS: The lungs are clear. Heart size is normal. No pneumothorax or pleural effusion. Small hiatal hernia is noted. Calcified breast implants seen on the prior study are no longer visualized.  IMPRESSION: No acute disease.  Small hiatal hernia.   Electronically Signed   By: Inge Rise M.D.   On: 09/27/2014 11:10   Ct Head Wo Contrast  10/10/2014   CLINICAL DATA:  Admitted for multiple blood clots. Syncopal episode today at home.  EXAM: CT HEAD WITHOUT CONTRAST  TECHNIQUE: Contiguous axial images were obtained from the base of the skull through the vertex without intravenous contrast.  COMPARISON:  09/28/2014; 12/27/2007  FINDINGS: The gray-white differentiation is maintained. No CT evidence of acute large territory infarct. No intraparenchymal or extra-axial mass or hemorrhage. Unchanged size and configuration of the ventricles and basilar cisterns. No midline shift. There  is scattered polypoid mucosal thickening of the right posterior ethmoidal air cells as well as the left sphenoid sinus. The remaining paranasal sinuses and mastoid air cells appear normally aerated. No air-fluid levels. Old right-sided nasal bone fracture (representative image 14, series 3). No acutely displaced calvarial fracture. Regional soft tissues appear normal.  IMPRESSION: Negative noncontrast head CT.   Electronically Signed   By: Sandi Mariscal M.D.   On: 10/10/2014 22:50   Ct Head Wo Contrast  09/28/2014   CLINICAL DATA:  Acute pulmonary embolism. No history of breast cancer.  EXAM: CT HEAD WITHOUT CONTRAST  TECHNIQUE: Contiguous axial images were obtained from the base of the skull through the vertex without intravenous contrast.  COMPARISON:  Head CT 12/27/2007  FINDINGS: All No acute intracranial hemorrhage. No focal mass lesion. No CT evidence of acute infarction. No midline shift or mass effect. No hydrocephalus. Basilar cisterns are patent. Paranasal sinuses and mastoid air cells are clear. Mild periventricular white matter hypodensities.  IMPRESSION: No acute intracranial findings.  Mild white matter microvascular change.   Electronically Signed   By: Suzy Bouchard M.D.   On: 09/28/2014 11:09   Ct Angio Chest Pe W/cm &/or Wo Cm  10/14/2014   CLINICAL DATA:  Recent right breast cancer surgery with 3 positive lymph nodes. History of pulmonary embolism, DVT and IVC filter placement.  EXAM: CT ANGIOGRAPHY CHEST  CT ABDOMEN AND PELVIS WITH CONTRAST  TECHNIQUE: Multidetector CT imaging of the chest was performed using the standard protocol during bolus administration of intravenous contrast. Multiplanar CT image reconstructions and MIPs were obtained to evaluate the vascular anatomy. Multidetector CT imaging of the abdomen and pelvis was performed using the standard protocol during bolus administration of intravenous contrast.  CONTRAST:  38mL OMNIPAQUE IOHEXOL 350 MG/ML SOLN  COMPARISON:  Mid chest  CTA 09/27/2014. Abdominal pelvic CT 08/09/2013.  FINDINGS: CTA CHEST FINDINGS  Mediastinum: The pulmonary arteries are well opacified with contrast. There has been interval significant lysis of the previously demonstrated acute pulmonary embolism bilaterally. There is a small amount of residual nonocclusive thromboembolic disease within the segmental and subsegmental branches of the pulmonary arteries. The  RV to LV ratio has nearly normalized (1.06). Left arm PICC the tip is in the lower SVC.Mild atherosclerosis appears stable. There are no enlarged mediastinal, hilar or axillary lymph nodes. There is a moderate size hiatal hernia.  Lungs/Pleura: There is no pleural effusion.There is mildly increased dependent atelectasis at both lung bases. No confluent airspace opacity or suspicious nodule.  Musculoskeletal/Chest wall: Bilateral breast implants/tissue expanders noted. No evidence of chest wall mass or suspicious osseous finding.  CT ABDOMEN and PELVIS FINDINGS  Hepatobiliary: Portal phase images through the abdomen are motion degraded. The liver demonstrates mild steatosis, but no focal lesion. No evidence of gallstones, gallbladder wall thickening or biliary dilatation.  Pancreas: Unremarkable. No pancreatic ductal dilatation or surrounding inflammatory changes.  Spleen: Normal in size without focal abnormality.  Adrenals/Urinary Tract: Both adrenal glands appear normal.There is a stable small low-density lesion posteriorly in the mid left kidney, best seen on image 11 of series 601. No evidence of hydronephrosis. The bladder appears normal.  Stomach/Bowel: No evidence of bowel wall thickening, distention or surrounding inflammatory change.Mild sigmoid diverticulosis.  Vascular/Lymphatic: There are no enlarged abdominal or pelvic lymph nodes. Mild aortoiliac atherosclerosis. Interval IVC filter placement inferior to the renal veins. There is limited contrast opacification of the infrarenal IVC and pelvic veins.   Reproductive: Probable uterine fibroid as before.  No adnexal mass.  Other: Interval development of subcutaneous edema in both flanks and proximal thighs. There is also mild mesenteric edema with a small amount of pelvic ascites.  Musculoskeletal: No acute or significant osseous findings.  Review of the MIP images confirms the above findings.  IMPRESSION: 1. Interval successful near-complete lysis of previously demonstrated bilateral pulmonary emboli. Signs of right heart strain have nearly completely resolved. 2. IVC filter placement with normal opacification of the suprarenal IVC. The infrarenal IVC and pelvic veins are suboptimally opacified. 3. Anasarca with generalized subcutaneous edema in the flanks and thighs and minimal ascites. 4. No evidence of metastatic breast cancer.   Electronically Signed   By: Richardean Sale M.D.   On: 10/14/2014 14:10   Ct Angio Chest Pe W/cm &/or Wo Cm  09/27/2014   CLINICAL DATA:  73YOF today pt was on the toilet and passed out. Small hematoma to head. [facility] reported that pt was orthostatic and there was a loss of radial pulse while standing. History of right breast cancer.  EXAM: CT ANGIOGRAPHY CHEST WITH CONTRAST  TECHNIQUE: Multidetector CT imaging of the chest was performed using the standard protocol during bolus administration of intravenous contrast. Multiplanar CT image reconstructions and MIPs were obtained to evaluate the vascular anatomy.  CONTRAST:  56mL OMNIPAQUE IOHEXOL 350 MG/ML SOLN  COMPARISON:  CT abdomen 08/09/2013 and chest x-ray today.  FINDINGS: Lungs are clear.  Airways are normal.  Heart is normal in size. Pulmonary arterial system is well opacified and demonstrates moderate burden of emboli over the origin of the upper and lower lobar pulmonary arteries bilaterally as well as the proximal lingular and right middle lobe pulmonary arteries. There is evidence of right heart strain with elevated RV/LV ratio of 50.5 mm/27.2 mm = 1.9 (normal less than 0.9).   There is a moderate size hiatal hernia. Remaining mediastinal structures are unremarkable. Bilateral breast implants present.  Images through the upper abdomen demonstrate a 3 mm calcification over the upper pole left kidney. Mild degenerative change of the spine. No acute fractures.  Review of the MIP images confirms the above findings.  IMPRESSION: Moderate burden of bilateral pulmonary emboli.  Positive for acute PE with CT evidence of right heart strain (RV/LV Ratio = 1.9) consistent with at least submassive (intermediate risk) PE. The presence of right heart strain has been associated with an increased risk of morbidity and mortality. Please activate Code PE by paging [phone].  Moderate size hiatal hernia.  3 mm calcification over the upper pole left renal cortex.  Critical Value/emergent results were called by telephone at the time of interpretation on 09/27/2014 at 1:33 pm to Dr. Blanchie Dessert , who verbally acknowledged these results.   Electronically Signed   By: Marin Olp M.D.   On: 09/27/2014 13:34   Mr Brain Wo Contrast  10/12/2014   CLINICAL DATA:  Episode of feeling clammy, diaphoretic, cold, dizzy, and lightheaded beginning 4 hours ago. History of right lower extremity DVT and pulmonary embolism recently. IVC filter placement. Syncopal episode. Ortho static dizziness.  EXAM: MRI HEAD WITHOUT CONTRAST  TECHNIQUE: Multiplanar, multiecho pulse sequences of the brain and surrounding structures were obtained without intravenous contrast.  COMPARISON:  CT head without contrast 10/10/2014  FINDINGS: No acute infarct, hemorrhage, or mass lesion is present. Mild atrophy is present. Moderate periventricular and scattered subcortical T2 changes are noted bilaterally. The ventricles are proportionate to the degree of atrophy. Mild white matter changes extend into the brainstem.  The globes and orbits are intact. Mild mucosal thickening is present within the ethmoid air cells bilaterally. There are no  fluid levels. The remaining paranasal sinuses are clear. There is fluid in the left mastoid air cells. No obstructing nasopharyngeal lesion is present.  IMPRESSION: 1. No acute intracranial abnormality. 2. Moderate periventricular and scattered subcortical T2 changes bilaterally. These are nonspecific, but likely reflects sequela of chronic microvascular ischemia. 3. Left mastoid effusion. No obstructing nasopharyngeal lesion is present.   Electronically Signed   By: San Morelle M.D.   On: 10/12/2014 19:14   Ct Abdomen Pelvis W Contrast  10/15/2014   CLINICAL DATA:  Evaluate IVC filter  EXAM: CT ABDOMEN AND PELVIS WITH CONTRAST  TECHNIQUE: Multidetector CT imaging of the abdomen and pelvis was performed using the standard protocol following bolus administration of intravenous contrast.  CONTRAST:  153mL OMNIPAQUE IOHEXOL 350 MG/ML SOLN  COMPARISON:  10/14/2014  FINDINGS: An IVC filter is stable in position. There is low-density within the filter as well as extending above the filter 12 mm. Bilateral iliac veins are somewhat distended and with heterogeneous density. These findings are worrisome for thrombus within the filter and extending just above the filter. Iliac veins are probably Ing cord sheet and dilated secondary to venous hypertension. Thrombus in the iliac venous system is not excluded.  Mild bibasilar atelectasis.  Hiatal hernia.  Diffuse hepatic steatosis.  Spleen, adrenal gland are within normal limits. Stable appearance of the kidneys.  There is a 21 x 11 mm low-density lesion at the junction of the body and head of the pancreas. This was poorly visualized yesterday secondary to motion artifact.  Stranding and edema within the subcutaneous fat throughout the abdomen and pelvis is stable.  Uterus remains heterogeneous  Bladder is decompressed.  Sigmoid diverticulosis without diverticulitis.  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  IMPRESSION: Findings are worrisome for  thrombus within the IVC filter and extending just above the filter. Thrombus in the iliac venous system cannot be excluded. The patient does have bilateral lower extremity DVT diagnosed by Doppler ultrasound. Dedicated venogram can be performed to further delineate.  There is a lesion within the junction of  the body and head of the pancreas as described. It is low-density. Malignancy is not excluded. MRI of the pancreas is recommended to further delineate.  Other findings are stable compared with yesterday.   Electronically Signed   By: Marybelle Killings M.D.   On: 10/15/2014 11:07   Ct Abdomen Pelvis W Contrast  10/14/2014   CLINICAL DATA:  Recent right breast cancer surgery with 3 positive lymph nodes. History of pulmonary embolism, DVT and IVC filter placement.  EXAM: CT ANGIOGRAPHY CHEST  CT ABDOMEN AND PELVIS WITH CONTRAST  TECHNIQUE: Multidetector CT imaging of the chest was performed using the standard protocol during bolus administration of intravenous contrast. Multiplanar CT image reconstructions and MIPs were obtained to evaluate the vascular anatomy. Multidetector CT imaging of the abdomen and pelvis was performed using the standard protocol during bolus administration of intravenous contrast.  CONTRAST:  17mL OMNIPAQUE IOHEXOL 350 MG/ML SOLN  COMPARISON:  Mid chest CTA 09/27/2014. Abdominal pelvic CT 08/09/2013.  FINDINGS: CTA CHEST FINDINGS  Mediastinum: The pulmonary arteries are well opacified with contrast. There has been interval significant lysis of the previously demonstrated acute pulmonary embolism bilaterally. There is a small amount of residual nonocclusive thromboembolic disease within the segmental and subsegmental branches of the pulmonary arteries. The RV to LV ratio has nearly normalized (1.06). Left arm PICC the tip is in the lower SVC.Mild atherosclerosis appears stable. There are no enlarged mediastinal, hilar or axillary lymph nodes. There is a moderate size hiatal hernia.   Lungs/Pleura: There is no pleural effusion.There is mildly increased dependent atelectasis at both lung bases. No confluent airspace opacity or suspicious nodule.  Musculoskeletal/Chest wall: Bilateral breast implants/tissue expanders noted. No evidence of chest wall mass or suspicious osseous finding.  CT ABDOMEN and PELVIS FINDINGS  Hepatobiliary: Portal phase images through the abdomen are motion degraded. The liver demonstrates mild steatosis, but no focal lesion. No evidence of gallstones, gallbladder wall thickening or biliary dilatation.  Pancreas: Unremarkable. No pancreatic ductal dilatation or surrounding inflammatory changes.  Spleen: Normal in size without focal abnormality.  Adrenals/Urinary Tract: Both adrenal glands appear normal.There is a stable small low-density lesion posteriorly in the mid left kidney, best seen on image 11 of series 601. No evidence of hydronephrosis. The bladder appears normal.  Stomach/Bowel: No evidence of bowel wall thickening, distention or surrounding inflammatory change.Mild sigmoid diverticulosis.  Vascular/Lymphatic: There are no enlarged abdominal or pelvic lymph nodes. Mild aortoiliac atherosclerosis. Interval IVC filter placement inferior to the renal veins. There is limited contrast opacification of the infrarenal IVC and pelvic veins.  Reproductive: Probable uterine fibroid as before.  No adnexal mass.  Other: Interval development of subcutaneous edema in both flanks and proximal thighs. There is also mild mesenteric edema with a small amount of pelvic ascites.  Musculoskeletal: No acute or significant osseous findings.  Review of the MIP images confirms the above findings.  IMPRESSION: 1. Interval successful near-complete lysis of previously demonstrated bilateral pulmonary emboli. Signs of right heart strain have nearly completely resolved. 2. IVC filter placement with normal opacification of the suprarenal IVC. The infrarenal IVC and pelvic veins are  suboptimally opacified. 3. Anasarca with generalized subcutaneous edema in the flanks and thighs and minimal ascites. 4. No evidence of metastatic breast cancer.   Electronically Signed   By: Richardean Sale M.D.   On: 10/14/2014 14:10   Ir Angiogram Pulmonary Bilateral Selective  09/29/2014   CLINICAL DATA:  Pulmonary thromboembolism.  Right heart strain.  EXAM: BILATERAL PULMONARY ARTERIOGRAPHY; ADDITIONAL ARTERIOGRAPHY;  IR ULTRASOUND GUIDANCE VASC ACCESS RIGHT; IR INFUSION THROMBOL ARTERIAL INITIAL (MS)  FLUOROSCOPY TIME:  6 minutes 24 seconds.  MEDICATIONS AND MEDICAL HISTORY: Versed 1 mg, Fentanyl 50 mcg.  Additional Medications: None.  ANESTHESIA/SEDATION: Moderate sedation time: 30 minutes  CONTRAST:  50 cc Omnipaque 300  PROCEDURE: The procedure, risks, benefits, and alternatives were explained to the patient. Questions regarding the procedure were encouraged and answered. The patient understands and consents to the procedure.  The right groin was prepped with Betadine in a sterile fashion, and a sterile drape was applied covering the operative field. A sterile gown and sterile gloves were used for the procedure.  Under sonographic guidance, a micropuncture needle was placed into the right common femoral vein and removed over a 018 wire which was up sized to a Bentson. A 7 French sheath was inserted. The identical procedure was performed in the same vein within additional 7 French sheath. A JB 1 catheter was advanced over a Bentson wire into the left pulmonary artery. Pressure and angiography was performed. Left pulmonary artery pressure was measured at 56/23 with a mean of 36 mm Hg. The catheter was removed over a longer Rosen wire. The identical procedure was performed into the right pulmonary artery through the other sheath. Right pulmonary artery pressure was measured at 50/23 with a mean of 31 mm Hg. Right and left 18 and 12 cm EKOS infusion catheters were then advanced into the right and left  pulmonary arteries. TPA infusion was instituted.  FINDINGS: Imaging confirms right and left pulmonary artery catheter position in the descending branches. Contrast injected confirms positioning and pulmonary thromboembolism.  COMPLICATIONS: None  IMPRESSION: Successful he close bilateral pulmonary artery thrombolytic infusion.   Electronically Signed   By: Marybelle Killings M.D.   On: 09/29/2014 08:46   Ir Angiogram Selective Each Additional Vessel  09/29/2014   CLINICAL DATA:  Pulmonary thromboembolism.  Right heart strain.  EXAM: BILATERAL PULMONARY ARTERIOGRAPHY; ADDITIONAL ARTERIOGRAPHY; IR ULTRASOUND GUIDANCE VASC ACCESS RIGHT; IR INFUSION THROMBOL ARTERIAL INITIAL (MS)  FLUOROSCOPY TIME:  6 minutes 24 seconds.  MEDICATIONS AND MEDICAL HISTORY: Versed 1 mg, Fentanyl 50 mcg.  Additional Medications: None.  ANESTHESIA/SEDATION: Moderate sedation time: 30 minutes  CONTRAST:  50 cc Omnipaque 300  PROCEDURE: The procedure, risks, benefits, and alternatives were explained to the patient. Questions regarding the procedure were encouraged and answered. The patient understands and consents to the procedure.  The right groin was prepped with Betadine in a sterile fashion, and a sterile drape was applied covering the operative field. A sterile gown and sterile gloves were used for the procedure.  Under sonographic guidance, a micropuncture needle was placed into the right common femoral vein and removed over a 018 wire which was up sized to a Bentson. A 7 French sheath was inserted. The identical procedure was performed in the same vein within additional 7 French sheath. A JB 1 catheter was advanced over a Bentson wire into the left pulmonary artery. Pressure and angiography was performed. Left pulmonary artery pressure was measured at 56/23 with a mean of 36 mm Hg. The catheter was removed over a longer Rosen wire. The identical procedure was performed into the right pulmonary artery through the other sheath. Right  pulmonary artery pressure was measured at 50/23 with a mean of 31 mm Hg. Right and left 18 and 12 cm EKOS infusion catheters were then advanced into the right and left pulmonary arteries. TPA infusion was instituted.  FINDINGS: Imaging confirms right and left  pulmonary artery catheter position in the descending branches. Contrast injected confirms positioning and pulmonary thromboembolism.  COMPLICATIONS: None  IMPRESSION: Successful he close bilateral pulmonary artery thrombolytic infusion.   Electronically Signed   By: Marybelle Killings M.D.   On: 09/29/2014 08:46   Ir Angiogram Selective Each Additional Vessel  09/29/2014   CLINICAL DATA:  Pulmonary thromboembolism.  Right heart strain.  EXAM: BILATERAL PULMONARY ARTERIOGRAPHY; ADDITIONAL ARTERIOGRAPHY; IR ULTRASOUND GUIDANCE VASC ACCESS RIGHT; IR INFUSION THROMBOL ARTERIAL INITIAL (MS)  FLUOROSCOPY TIME:  6 minutes 24 seconds.  MEDICATIONS AND MEDICAL HISTORY: Versed 1 mg, Fentanyl 50 mcg.  Additional Medications: None.  ANESTHESIA/SEDATION: Moderate sedation time: 30 minutes  CONTRAST:  50 cc Omnipaque 300  PROCEDURE: The procedure, risks, benefits, and alternatives were explained to the patient. Questions regarding the procedure were encouraged and answered. The patient understands and consents to the procedure.  The right groin was prepped with Betadine in a sterile fashion, and a sterile drape was applied covering the operative field. A sterile gown and sterile gloves were used for the procedure.  Under sonographic guidance, a micropuncture needle was placed into the right common femoral vein and removed over a 018 wire which was up sized to a Bentson. A 7 French sheath was inserted. The identical procedure was performed in the same vein within additional 7 French sheath. A JB 1 catheter was advanced over a Bentson wire into the left pulmonary artery. Pressure and angiography was performed. Left pulmonary artery pressure was measured at 56/23 with a mean of 36  mm Hg. The catheter was removed over a longer Rosen wire. The identical procedure was performed into the right pulmonary artery through the other sheath. Right pulmonary artery pressure was measured at 50/23 with a mean of 31 mm Hg. Right and left 18 and 12 cm EKOS infusion catheters were then advanced into the right and left pulmonary arteries. TPA infusion was instituted.  FINDINGS: Imaging confirms right and left pulmonary artery catheter position in the descending branches. Contrast injected confirms positioning and pulmonary thromboembolism.  COMPLICATIONS: None  IMPRESSION: Successful he close bilateral pulmonary artery thrombolytic infusion.   Electronically Signed   By: Marybelle Killings M.D.   On: 09/29/2014 08:46   Ir Ivc Filter Plmt / S&i /img Guid/mod Sed  10/02/2014   CLINICAL DATA:  Pulmonary thromboembolism. Peri-orbitall hemorrhage after anti coagulation.  EXAM: IVC FILTER,INFERIOR VENA CAVOGRAM  FLUOROSCOPY TIME:  1 minutes and 12 seconds.  MEDICATIONS AND MEDICAL HISTORY: Versed 1 mg, Fentanyl 50 mcg.  Additional Medications: None.  ANESTHESIA/SEDATION: Moderate sedation time: 15 minutes  CONTRAST:  40 cc Omnipaque 300  PROCEDURE: The procedure, risks, benefits, and alternatives were explained to the patient. Questions regarding the procedure were encouraged and answered. The patient understands and consents to the procedure.  The right neck was prepped with Betadine in a sterile fashion, and a sterile drape was applied covering the operative field. A sterile gown and sterile gloves were used for the procedure.  The Betadine vein was noted to be patent initially with ultrasound. Under sonographic guidance, a micropuncture needle was inserted into the right internal jugular vein (Ultrasound image documentation was performed). It was removed over an 018 wire which was upsized to a [place]. The sheath was inserted over the wire and into the IVC. IVC venography was performed.  The temporary filter was  then deployed in the infrarenal IVC. The sheath was removed and hemostasis was achieved with direct pressure.  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 No IVC thrombus or venous anomaly.  The image demonstrates placement of an IVC filter with its tip at the L1-2 disc.  COMPLICATIONS: None  IMPRESSION: Successful infrarenal IVC filter placement. This is a temporary filter. It can be removed or remain in place to become permanent.   Electronically Signed   By: Marybelle Killings M.D.   On: 10/02/2014 13:21   US Renal  10/11/2014   CLINICAL DATA:  Acute renal insufficiency  EXAM: RENAL/URINARY TRACT ULTRASOUND COMPLETE  COMPARISON:  CT scan 2/ 19/ 15  FINDINGS: Right Kidney:  Length: 9 mm. Echogenicity within normal limits. No mass or hydronephrosis visualized. Mild renal cortical thinning probable due to atrophy.  Left Kidney:  Length: 10.7 cm. Echogenicity within normal limits. No mass or hydronephrosis visualized. Mild renal cortical thinning probable due to atrophy.  Bladder:  Appears normal for degree of bladder distention.  IMPRESSION: 1. No hydronephrosis. No renal calculi. Bilateral mild renal cortical thinning probable due to atrophy. Unremarkable urinary bladder.   Electronically Signed   By: Lahoma Crocker M.D.   On: 10/11/2014 09:46   Ir US Guide Vasc Access Right  09/29/2014   CLINICAL DATA:  Pulmonary thromboembolism.  Right heart strain.  EXAM: BILATERAL PULMONARY ARTERIOGRAPHY; ADDITIONAL ARTERIOGRAPHY; IR ULTRASOUND GUIDANCE VASC ACCESS RIGHT; IR INFUSION THROMBOL ARTERIAL INITIAL (MS)  FLUOROSCOPY TIME:  6 minutes 24 seconds.  MEDICATIONS AND MEDICAL HISTORY: Versed 1 mg, Fentanyl 50 mcg.  Additional Medications: None.  ANESTHESIA/SEDATION: Moderate sedation time: 30 minutes  CONTRAST:  50 cc Omnipaque 300  PROCEDURE: The procedure, risks, benefits, and alternatives were explained to the patient. Questions regarding the procedure were encouraged and answered. The patient understands and  consents to the procedure.  The right groin was prepped with Betadine in a sterile fashion, and a sterile drape was applied covering the operative field. A sterile gown and sterile gloves were used for the procedure.  Under sonographic guidance, a micropuncture needle was placed into the right common femoral vein and removed over a 018 wire which was up sized to a Bentson. A 7 French sheath was inserted. The identical procedure was performed in the same vein within additional 7 French sheath. A JB 1 catheter was advanced over a Bentson wire into the left pulmonary artery. Pressure and angiography was performed. Left pulmonary artery pressure was measured at 56/23 with a mean of 36 mm Hg. The catheter was removed over a longer Rosen wire. The identical procedure was performed into the right pulmonary artery through the other sheath. Right pulmonary artery pressure was measured at 50/23 with a mean of 31 mm Hg. Right and left 18 and 12 cm EKOS infusion catheters were then advanced into the right and left pulmonary arteries. TPA infusion was instituted.  FINDINGS: Imaging confirms right and left pulmonary artery catheter position in the descending branches. Contrast injected confirms positioning and pulmonary thromboembolism.  COMPLICATIONS: None  IMPRESSION: Successful he close bilateral pulmonary artery thrombolytic infusion.   Electronically Signed   By: Marybelle Killings M.D.   On: 09/29/2014 08:46   Ir Infusion Thrombol Arterial Initial (ms)  09/29/2014   CLINICAL DATA:  Pulmonary thromboembolism.  Right heart strain.  EXAM: BILATERAL PULMONARY ARTERIOGRAPHY; ADDITIONAL ARTERIOGRAPHY; IR ULTRASOUND GUIDANCE VASC ACCESS RIGHT; IR INFUSION THROMBOL ARTERIAL INITIAL (MS)  FLUOROSCOPY TIME:  6 minutes 24 seconds.  MEDICATIONS AND MEDICAL HISTORY: Versed 1 mg, Fentanyl 50 mcg.  Additional Medications: None.  ANESTHESIA/SEDATION: Moderate sedation time: 30 minutes  CONTRAST:  50  cc Omnipaque 300  PROCEDURE: The procedure,  risks, benefits, and alternatives were explained to the patient. Questions regarding the procedure were encouraged and answered. The patient understands and consents to the procedure.  The right groin was prepped with Betadine in a sterile fashion, and a sterile drape was applied covering the operative field. A sterile gown and sterile gloves were used for the procedure.  Under sonographic guidance, a micropuncture needle was placed into the right common femoral vein and removed over a 018 wire which was up sized to a Bentson. A 7 French sheath was inserted. The identical procedure was performed in the same vein within additional 7 French sheath. A JB 1 catheter was advanced over a Bentson wire into the left pulmonary artery. Pressure and angiography was performed. Left pulmonary artery pressure was measured at 56/23 with a mean of 36 mm Hg. The catheter was removed over a longer Rosen wire. The identical procedure was performed into the right pulmonary artery through the other sheath. Right pulmonary artery pressure was measured at 50/23 with a mean of 31 mm Hg. Right and left 18 and 12 cm EKOS infusion catheters were then advanced into the right and left pulmonary arteries. TPA infusion was instituted.  FINDINGS: Imaging confirms right and left pulmonary artery catheter position in the descending branches. Contrast injected confirms positioning and pulmonary thromboembolism.  COMPLICATIONS: None  IMPRESSION: Successful he close bilateral pulmonary artery thrombolytic infusion.   Electronically Signed   By: Marybelle Killings M.D.   On: 09/29/2014 08:46   Ir Jacolyn Reedy F/u Eval Art/ven Final Day (ms)  09/29/2014   CLINICAL DATA:  Follow-up EKOS pulmonary artery thrombolytic therapy.  EXAM: IR THROMB F/U EVAL ART/VEN FINAL DAY  FLUOROSCOPY TIME:  20 seconds.  MEDICATIONS AND MEDICAL HISTORY: None  ANESTHESIA/SEDATION: None  CONTRAST:  None  PROCEDURE: The procedure, risks, benefits, and alternatives were explained to the  patient. Questions regarding the procedure were encouraged and answered. The patient understands and consents to the procedure.  Right and left pulmonary artery pressures were obtained. Right pulmonary artery pressure is 35/27 with a mean of 31 mm Hg. That of the left is 34/29 with a mean of 31 mm Hg. The catheters and sheaths were removed. Hemostasis was achieved with direct pressure and VPAD.  FINDINGS: Fluoroscopic imaging confirms stable position of the right and left pulmonary artery EKOS catheters.  COMPLICATIONS: None  IMPRESSION: Successful conclusion of bilateral pulmonary artery EKOS thrombo lytic therapy. Peak systolic pressures have significantly improved from 56 to 35 mm Hg in the right pulmonary artery and 50 to 34 mm Hg in the left pulmonary artery.   Electronically Signed   By: Marybelle Killings M.D.   On: 09/29/2014 09:21    CBC  Recent Labs Lab 10/10/14 2052 10/11/14 1055  WBC 11.3* 8.2  HGB 11.5* 10.2*  HCT 38.5 33.8*  PLT 257 199  MCV 84.6 85.4  MCH 25.3* 25.8*  MCHC 29.9* 30.2  RDW 22.4* 22.7*  LYMPHSABS 0.6*  --   MONOABS 0.6  --   EOSABS 0.0  --   BASOSABS 0.0  --     Chemistries   Recent Labs Lab 10/10/14 2052 10/11/14 1055 10/12/14 0530  NA 138 140 138  K 4.3 3.7 4.4  CL 102 108 105  CO2 23 22 23   GLUCOSE 172* 125* 121*  BUN 16 18 15   CREATININE 1.73* 1.10 0.88  CALCIUM 9.8 8.9 9.5  AST 30 23  --   ALT 19  15  --   ALKPHOS 68 55  --   BILITOT 0.6 0.6  --    ------------------------------------------------------------------------------------------------------------------ estimated creatinine clearance is 64.2 mL/min (by C-G formula based on Cr of 0.88). ------------------------------------------------------------------------------------------------------------------ No results for input(s): HGBA1C in the last 72 hours. ------------------------------------------------------------------------------------------------------------------ No results for  input(s): CHOL, HDL, LDLCALC, TRIG, CHOLHDL, LDLDIRECT in the last 72 hours. ------------------------------------------------------------------------------------------------------------------ No results for input(s): TSH, T4TOTAL, T3FREE, THYROIDAB in the last 72 hours.  Invalid input(s): FREET3 ------------------------------------------------------------------------------------------------------------------ No results for input(s): VITAMINB12, FOLATE, FERRITIN, TIBC, IRON, RETICCTPCT in the last 72 hours.  Coagulation profile  Recent Labs Lab 10/10/14 2052  INR 1.09    No results for input(s): DDIMER in the last 72 hours.  Cardiac Enzymes  Recent Labs Lab 10/12/14 1232 10/12/14 1645 10/12/14 2330  TROPONINI <0.03 <0.03 <0.03   ------------------------------------------------------------------------------------------------------------------ Invalid input(s): POCBNP  No results for input(s): GLUCAP in the last 72 hours.   RAI,RIPUDEEP M.D. [facility] 10/15/2014, 2:18 PM  Pager: [phone]   Between 7am to 7pm - call Pager - [phone]  After 7pm go to [email or web address] - password [email or web address]  Call night coverage person covering after 7pm

## 2014-10-15 NOTE — Progress Notes (Signed)
Subjective:  No SOB at rest  Objective:  Vital Signs in the last 24 hours: Temp:  [99.1 F (37.3 C)-99.3 F (37.4 C)] 99.3 F (37.4 C) (04/25 2242) Pulse Rate:  [109-110] 109 (04/25 2242) Resp:  [18] 18 (04/25 1400) SpO2:  [97 %] 97 % (04/25 2242)  Intake/Output from previous day:  Intake/Output Summary (Last 24 hours) at 10/15/14 0818 Last data filed at 10/15/14 9381  Gross per 24 hour  Intake    740 ml  Output    490 ml  Net    250 ml    Physical Exam: General appearance: alert, cooperative, no distress and pale Neck: no JVD Lungs: clear to auscultation bilaterally Heart: regular rate and rhythm Abdomen: soft, non tender Extremities: 2-3+ edema to her thighs   Rate: 88  Rhythm: normal sinus rhythm by EKG 10/10/14   Recent Labs  10/12/14 1645 10/12/14 2330  TROPONINI <0.03 <0.03   Echo: 10/11/14 Study Conclusions  - Left ventricle: The cavity size was normal. Wall thickness was increased in a pattern of mild LVH. Systolic function was normal. The estimated ejection fraction was in the range of 60% to 65%. Wall motion was normal; there were no regional wall motion abnormalities. Doppler parameters are consistent with abnormal left ventricular relaxation (grade 1 diastolic dysfunction).  Impressions:  - Since prior echo, McConnell&'s sign is no longer seen.   Scheduled Meds: . acidophilus  1 capsule Oral Daily  . calcium carbonate  1,500 mg of elemental calcium Oral Q breakfast  . cholecalciferol  5,000 Units Oral Daily  . famotidine  20 mg Oral Daily  . heparin  5,000 Units Subcutaneous 3 times per day  . iron polysaccharides  150 mg Oral Daily  . multivitamin with minerals  1 tablet Oral Daily  . omega-3 acid ethyl esters  1 g Oral Daily  . sodium chloride  3 mL Intravenous Q12H  . vitamin C  250 mg Oral Daily  . 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 She is s/p radiation therapy in Dec 2014 and Tamoxifen Rx. Has long standing h/o anemia. GI endoscopic eval has been negative. She is s/p breast reconstruction surgery in Jan 2016 for ruptured left breast implant. She was discharged to home on 1/7 w/ some limitation in mobility. Starting about 8 weeks prior to admission earlier this month she started to note right groin and leg pain. Admitted on 09/27/14 w/ acute bilateral Pulmonary emboli and w/ evidence of right heart strain. She was placed on Xarelto but had spontaneous peri orbital hemorrhage.  An IVC filter was placed 10/02/14. She was discharged 10/03/14. She is admitted now with syncope and lower extremity edema to her thighs.   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 LE dopplers show bilat LE DVT. She is suspected to have an occluded IVC filter. Echo done 10/11/14 shows normal  LVF, mild LVH, and Nl RV function. Plan per CCM/Pulmonary. For venogram this am.  Not much for cardiology to add. Will review with MD this am.  Kerin Ransom [facility] [phone] 10/15/2014, 8:18 AM    Personally seen and examined. Agree with above. Ecchymosis noted surrounding B eyes Agree with anticoagulation (reviewed Dr. Chase Caller note) Challenging situation. She understands risks with anticoagulation.  Will sign off. Please call if questions  Candee Furbish, MD

## 2014-10-15 NOTE — Progress Notes (Signed)
Patient is not opposed to getting her foot stuck for IV draws, Paged Dr Tana Coast for a decision about blood pressure and obtain orders for foot.

## 2014-10-15 NOTE — Consult Note (Addendum)
Name: [name] MRN: [id number] DOB: 1940/07/07    ADMISSION DATE:  10/10/2014 CONSULTATION DATE:  10/14/2014  REFERRING MD :  Dr. Tana Coast  CHIEF COMPLAINT:  syncope  BRIEF PATIENT DESCRIPTION:  74 year old female with PMH as below. She was admitted to [facility] ED 09/27/14 with bilateral PE and received thrombolysis via EKOS on 4/9. Heprain gtt was eventually transitioned to xarelto, however, she developed periorbital ecchymosis. Xarelto was D/c'd and IVF filter was placed. She was discharged to home 4/14. 4/21 she again presented to [facility] c/o orthostasis and syncope. She was admitted and treated initially with IVF, which resulted in massive lower extremity edema. Echocardiogram has normalized since prior admission. Based on relatively normal heart and kidney function there is concern that her IVC filter may be obstructing. PCCM asked to consult.     SIGNIFICANT EVENTS  4/09>>>EKOS 4/10>> 1uRBC transfusion 4/12 increased periorbital ecchymosis 4/13 IVC filter placed 4/21 admitted for syncope  STUDIES:  4/08 CT chest >> Moderate burden of bilateral pulmonary emboli. Positive for acute PE with CT evidence of right heart strain (RV/LV Ratio = 1.9).  4/08 Echo >> EF 75%, mod RV dilation with decreased systolic fx, PAS 61 mmHg, mod/severe TR 4/09 LE dopplers >>> acute DVT in rt popliteal vein, rt TB vein, and rt peroneal vein 4/21 CT head > No acute abnormalities 4/22 Renal US > No hydronephrosis. No renal calculi. Bilateral mild renal cortical thinning probable due to atrophy. 4/22 Echo > LVEF 60-65%, Grade 1 DD.  Improvement from prior study. 4/23 MRI brain > No acute intracranial abnormality.  SUBJECTIVE:  10/15/2014: Diagnosis of Acute DVT on duplex. CT abdomen  shows distal to IVC - dvt likely. She is in bed. Periorbital echymmoses nearly resolved from prior admit. Denies bleeding here or at home. No visual issues. No neuro deficits.   VITAL SIGNS: Temp:  [99.1 F (37.3 C)-99.3 F (37.4  C)] 99.3 F (37.4 C) (04/25 2242) Pulse Rate:  [109-110] 109 (04/25 2242) Resp:  [18] 18 (04/25 1400) SpO2:  [97 %] 97 % (04/25 2242)  PHYSICAL EXAMINATION: General:  73 year old female in NAD Neuro:  Alert, oriented, non-focal HEENT:  [place]/AT, No JVD noted, PERRL Cardiovascular:  RRR, no MRG Lungs:  Clear bilateral breath sounds. Somewhat diminished R Abdomen:  Soft, non-tender, non-distended Musculoskeletal:  BLE edema and pain R > L  - in SCDs Skin:  Grossly intact  PULMONARY No results for input(s): PHART, PCO2ART, PO2ART, HCO3, TCO2, O2SAT in the last 168 hours.  Invalid input(s): PCO2, PO2  CBC  Recent Labs Lab 10/10/14 2052 10/11/14 1055  HGB 11.5* 10.2*  HCT 38.5 33.8*  WBC 11.3* 8.2  PLT 257 199    COAGULATION  Recent Labs Lab 10/10/14 2052  INR 1.09    CARDIAC   Recent Labs Lab 10/12/14 1232 10/12/14 1645 10/12/14 2330  TROPONINI <0.03 <0.03 <0.03   No results for input(s): PROBNP in the last 168 hours.   CHEMISTRY  Recent Labs Lab 10/10/14 2052 10/11/14 1055 10/12/14 0530  NA 138 140 138  K 4.3 3.7 4.4  CL 102 108 105  CO2 23 22 23   GLUCOSE 172* 125* 121*  BUN 16 18 15   CREATININE 1.73* 1.10 0.88  CALCIUM 9.8 8.9 9.5   Estimated Creatinine Clearance: 64.2 mL/min (by C-G formula based on Cr of 0.88).   LIVER  Recent Labs Lab 10/10/14 2052 10/11/14 1055  AST 30 23  ALT 19 15  ALKPHOS 68 55  BILITOT 0.6 0.6  PROT 6.8 5.5*  ALBUMIN 3.8 3.1*  INR 1.09  --      INFECTIOUS No results for input(s): LATICACIDVEN, PROCALCITON in the last 168 hours.   ENDOCRINE CBG (last 3)  No results for input(s): GLUCAP in the last 72 hours.       IMAGING x48h - images reviewed personally  Ct Angio Chest Pe W/cm &/or Wo Cm  10/14/2014   CLINICAL DATA:  Recent right breast cancer surgery with 3 positive lymph nodes. History of pulmonary embolism, DVT and IVC filter placement.  EXAM: CT ANGIOGRAPHY CHEST  CT ABDOMEN AND PELVIS WITH  CONTRAST  TECHNIQUE: Multidetector CT imaging of the chest was performed using the standard protocol during bolus administration of intravenous contrast. Multiplanar CT image reconstructions and MIPs were obtained to evaluate the vascular anatomy. Multidetector CT imaging of the abdomen and pelvis was performed using the standard protocol during bolus administration of intravenous contrast.  CONTRAST:  59mL OMNIPAQUE IOHEXOL 350 MG/ML SOLN  COMPARISON:  Mid chest CTA 09/27/2014. Abdominal pelvic CT 08/09/2013.  FINDINGS: CTA CHEST FINDINGS  Mediastinum: The pulmonary arteries are well opacified with contrast. There has been interval significant lysis of the previously demonstrated acute pulmonary embolism bilaterally. There is a small amount of residual nonocclusive thromboembolic disease within the segmental and subsegmental branches of the pulmonary arteries. The RV to LV ratio has nearly normalized (1.06). Left arm PICC the tip is in the lower SVC.Mild atherosclerosis appears stable. There are no enlarged mediastinal, hilar or axillary lymph nodes. There is a moderate size hiatal hernia.  Lungs/Pleura: There is no pleural effusion.There is mildly increased dependent atelectasis at both lung bases. No confluent airspace opacity or suspicious nodule.  Musculoskeletal/Chest wall: Bilateral breast implants/tissue expanders noted. No evidence of chest wall mass or suspicious osseous finding.  CT ABDOMEN and PELVIS FINDINGS  Hepatobiliary: Portal phase images through the abdomen are motion degraded. The liver demonstrates mild steatosis, but no focal lesion. No evidence of gallstones, gallbladder wall thickening or biliary dilatation.  Pancreas: Unremarkable. No pancreatic ductal dilatation or surrounding inflammatory changes.  Spleen: Normal in size without focal abnormality.  Adrenals/Urinary Tract: Both adrenal glands appear normal.There is a stable small low-density lesion posteriorly in the mid left kidney, best  seen on image 11 of series 601. No evidence of hydronephrosis. The bladder appears normal.  Stomach/Bowel: No evidence of bowel wall thickening, distention or surrounding inflammatory change.Mild sigmoid diverticulosis.  Vascular/Lymphatic: There are no enlarged abdominal or pelvic lymph nodes. Mild aortoiliac atherosclerosis. Interval IVC filter placement inferior to the renal veins. There is limited contrast opacification of the infrarenal IVC and pelvic veins.  Reproductive: Probable uterine fibroid as before.  No adnexal mass.  Other: Interval development of subcutaneous edema in both flanks and proximal thighs. There is also mild mesenteric edema with a small amount of pelvic ascites.  Musculoskeletal: No acute or significant osseous findings.  Review of the MIP images confirms the above findings.  IMPRESSION: 1. Interval successful near-complete lysis of previously demonstrated bilateral pulmonary emboli. Signs of right heart strain have nearly completely resolved. 2. IVC filter placement with normal opacification of the suprarenal IVC. The infrarenal IVC and pelvic veins are suboptimally opacified. 3. Anasarca with generalized subcutaneous edema in the flanks and thighs and minimal ascites. 4. No evidence of metastatic breast cancer.   Electronically Signed   By: Richardean Sale M.D.   On: 10/14/2014 14:10   Ct Abdomen Pelvis W Contrast  10/14/2014   CLINICAL  DATA:  Recent right breast cancer surgery with 3 positive lymph nodes. History of pulmonary embolism, DVT and IVC filter placement.  EXAM: CT ANGIOGRAPHY CHEST  CT ABDOMEN AND PELVIS WITH CONTRAST  TECHNIQUE: Multidetector CT imaging of the chest was performed using the standard protocol during bolus administration of intravenous contrast. Multiplanar CT image reconstructions and MIPs were obtained to evaluate the vascular anatomy. Multidetector CT imaging of the abdomen and pelvis was performed using the standard protocol during bolus administration  of intravenous contrast.  CONTRAST:  25mL OMNIPAQUE IOHEXOL 350 MG/ML SOLN  COMPARISON:  Mid chest CTA 09/27/2014. Abdominal pelvic CT 08/09/2013.  FINDINGS: CTA CHEST FINDINGS  Mediastinum: The pulmonary arteries are well opacified with contrast. There has been interval significant lysis of the previously demonstrated acute pulmonary embolism bilaterally. There is a small amount of residual nonocclusive thromboembolic disease within the segmental and subsegmental branches of the pulmonary arteries. The RV to LV ratio has nearly normalized (1.06). Left arm PICC the tip is in the lower SVC.Mild atherosclerosis appears stable. There are no enlarged mediastinal, hilar or axillary lymph nodes. There is a moderate size hiatal hernia.  Lungs/Pleura: There is no pleural effusion.There is mildly increased dependent atelectasis at both lung bases. No confluent airspace opacity or suspicious nodule.  Musculoskeletal/Chest wall: Bilateral breast implants/tissue expanders noted. No evidence of chest wall mass or suspicious osseous finding.  CT ABDOMEN and PELVIS FINDINGS  Hepatobiliary: Portal phase images through the abdomen are motion degraded. The liver demonstrates mild steatosis, but no focal lesion. No evidence of gallstones, gallbladder wall thickening or biliary dilatation.  Pancreas: Unremarkable. No pancreatic ductal dilatation or surrounding inflammatory changes.  Spleen: Normal in size without focal abnormality.  Adrenals/Urinary Tract: Both adrenal glands appear normal.There is a stable small low-density lesion posteriorly in the mid left kidney, best seen on image 11 of series 601. No evidence of hydronephrosis. The bladder appears normal.  Stomach/Bowel: No evidence of bowel wall thickening, distention or surrounding inflammatory change.Mild sigmoid diverticulosis.  Vascular/Lymphatic: There are no enlarged abdominal or pelvic lymph nodes. Mild aortoiliac atherosclerosis. Interval IVC filter placement inferior to  the renal veins. There is limited contrast opacification of the infrarenal IVC and pelvic veins.  Reproductive: Probable uterine fibroid as before.  No adnexal mass.  Other: Interval development of subcutaneous edema in both flanks and proximal thighs. There is also mild mesenteric edema with a small amount of pelvic ascites.  Musculoskeletal: No acute or significant osseous findings.  Review of the MIP images confirms the above findings.  IMPRESSION: 1. Interval successful near-complete lysis of previously demonstrated bilateral pulmonary emboli. Signs of right heart strain have nearly completely resolved. 2. IVC filter placement with normal opacification of the suprarenal IVC. The infrarenal IVC and pelvic veins are suboptimally opacified. 3. Anasarca with generalized subcutaneous edema in the flanks and thighs and minimal ascites. 4. No evidence of metastatic breast cancer.   Electronically Signed   By: Richardean Sale M.D.   On: 10/14/2014 14:10     ECHO - RV strain resolved    ASSESSMENT / PLAN: # Bilateral pulmonary emboli s/p EKOS, IVF filter placement 4/13 due to periorbital bleed and oral anticoagulation held  #Readmit 10/10/2014 Syncope,  2nd to IVC filter  Distal thrombosis becuae repeat CT   - shows near reolsution of PE and ECHO shows normalization of RV pressures.   - Duplex LE with Acute DVT and CTabd with thrombosis distal to IVC  - No evidence of bleeding since discharge: in fact  HGb improved   PLAN  - Restart anticoagulation - she has no other choice due to acute DVT distal to IVC filter - she is very hypercoagulable at this point due to her cancer hx and recent submassive PE hx   -  Unless major bleeding have advised her she needs anticoagulation  - Start IV heparin for 3-5 days as inpatient  - Then start NOAC (possibly Eliquis) prior to discharge  - Do NOT REMOVE IVC filter now - clot can dislodge and cause PE; will aim for this in several weeks to a few months   -  Monitor orthostatics  Dr. Brand Males, M.D., [facility].C.P Pulmonary and Critical Care Medicine Staff Physician [facility] Pager: [phone], If no answer or between  15:00h - 7:00h: call [phone]  10/15/2014 10:57 AM

## 2014-10-15 NOTE — Progress Notes (Signed)
ANTICOAGULATION CONSULT NOTE - Initial Consult  Pharmacy Consult for Heparin Indication: pulmonary embolus and DVT  No Known Allergies  Patient Measurements: Height: 5\' 5"  (165.1 cm) Weight: 199 lb 4.7 oz (90.4 kg) IBW/kg (Calculated) : 57 Heparin Dosing Weight:   Vital Signs:    Labs:  Recent Labs  10/12/14 1232 10/12/14 1645 10/12/14 2330  TROPONINI <0.03 <0.03 <0.03    Estimated Creatinine Clearance: 64.2 mL/min (by C-G formula based on Cr of 0.88).   Medical History: Past Medical History  Diagnosis Date  . Essential hypertension   . Hypercholesterolemia   . Vitamin D deficiency   . Fibromyalgia   . Anemia   . Depression     related to fibromyalgia  . Complication of anesthesia 2009    nausea  . GERD (gastroesophageal reflux disease)   . Diverticulosis   . Hiatal hernia   . 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 Iron deficiency anemia   . S/P radiation therapy 09/24/2013-10/17/2013    Right breast / 42.56 Gy in 16 fractions  . Use of tamoxifen (Nolvadex)     ????  . PONV (postoperative nausea and vomiting)     put scope patch on-still got sick last surgery  . Wears contact lenses   . History of pulmonary embolism 09/27/14    Medications:  Scheduled:  . acidophilus  1 capsule Oral Daily  . calcium carbonate  1,500 mg of elemental calcium Oral Q breakfast  . cholecalciferol  5,000 Units Oral Daily  . famotidine  20 mg Oral Daily  . heparin  5,000 Units Subcutaneous 3 times per day  . iron polysaccharides  150 mg Oral Daily  . multivitamin with minerals  1 tablet Oral Daily  . omega-3 acid ethyl esters  1 g Oral Daily  . sodium chloride  3 mL Intravenous Q12H  . vitamin C  250 mg Oral Daily  . vitamin E  100 Units Oral Daily    Assessment: 74yo female with history of breast cancer admitted 4/21 d/t syncope, with recent hx of B-PE (R-heart straining per ECHO 09/27/14)  and hx RLE DVT.  She is s/p EKOS on 4/9 (d/c home 4/14).  She was transitioned from Heparin drip -> Xarelto, developed periorbital ecchymosis and an IVC filter was placed 10/02/14.   CT of 4/25 demonstrates near complete lysis of B-PE & normalization of RV pressures.  Pt is on no anticoagulation at home.  Pt now with new acute DVT distal to IVC filter and periorbital echymmoses are nearly resolved from prior admission.  Hg has improved.  Due to hypercoaguable state, plan is to start Heparin x 3-5 days, then transition to NOAC (possibly apixaban).  The IVC filter will not be removed at this time d/t risk of dislodging a clot that may cause a PE, but with plan to do remove in a few months.  INR on 4/21 was 1.09.  Hg 10.2 (8.9 on 4/14) with pltc 199 on 4/22.  Pt is currently on Heparin SQ for VTE px and received a dose at 0600.  She was previously therapeutic on Heparin 1100 units/hr during 4/9 admission.     Goal of Therapy:  Heparin level 0.3-0.7 units/ml Monitor platelets by anticoagulation protocol: Yes   Plan:  Heparin 1100 units/hr; no bolus given recent bleeding. Heparin level 8hr Daily HL, CBC D/C heparin SQ  Gracy Bruins, PharmD Clinical Pharmacist  [facility]

## 2014-10-15 NOTE — Progress Notes (Signed)
Patient with single lumen  PICC line on left upper arm is receiving Heparin@11ml /hr. Lab unable to draw blood because the patient right arm is restricted due to an old right mastectomy. MD notified.

## 2014-10-15 NOTE — Progress Notes (Signed)
OT Cancellation Note  Patient Details Name: [name] MRN: [id number] DOB: 1940/10/17   Cancelled Treatment:    Reason Eval/Treat Not Completed: Patient at procedure or test/ unavailable  Benito Mccreedy OTR/L [phone] 10/15/2014, 9:52 AM

## 2014-10-16 ENCOUNTER — Telehealth: Payer: Self-pay | Admitting: Hematology and Oncology

## 2014-10-16 DIAGNOSIS — Z7901 Long term (current) use of anticoagulants: Secondary | ICD-10-CM

## 2014-10-16 DIAGNOSIS — Z79810 Long term (current) use of selective estrogen receptor modulators (SERMs): Secondary | ICD-10-CM

## 2014-10-16 DIAGNOSIS — K869 Disease of pancreas, unspecified: Secondary | ICD-10-CM

## 2014-10-16 DIAGNOSIS — I82403 Acute embolism and thrombosis of unspecified deep veins of lower extremity, bilateral: Secondary | ICD-10-CM

## 2014-10-16 DIAGNOSIS — Z95828 Presence of other vascular implants and grafts: Secondary | ICD-10-CM

## 2014-10-16 DIAGNOSIS — I871 Compression of vein: Secondary | ICD-10-CM | POA: Diagnosis present

## 2014-10-16 DIAGNOSIS — Z170 Estrogen receptor positive status [ER+]: Secondary | ICD-10-CM

## 2014-10-16 LAB — BASIC METABOLIC PANEL
Anion gap: 11 (ref 5–15)
BUN: 10 mg/dL (ref 6–23)
CO2: 24 mmol/L (ref 19–32)
Calcium: 8.7 mg/dL (ref 8.4–10.5)
Chloride: 100 mmol/L (ref 96–112)
Creatinine, Ser: 0.72 mg/dL (ref 0.50–1.10)
GFR calc Af Amer: >90 mL/min (ref >90)
GFR, EST NON AFRICAN AMERICAN: 84 mL/min — AB (ref >90)
Glucose, Bld: 119 mg/dL — ABNORMAL HIGH (ref 70–99)
POTASSIUM: 3.8 mmol/L (ref 3.5–5.1)
SODIUM: 135 mmol/L (ref 135–145)

## 2014-10-16 LAB — CBC
HEMATOCRIT: 29.3 % — AB (ref 36.0–46.0)
HEMOGLOBIN: 9.1 g/dL — AB (ref 12.0–15.0)
MCH: 25.6 pg — ABNORMAL LOW (ref 26.0–34.0)
MCHC: 31.1 g/dL (ref 30.0–36.0)
MCV: 82.5 fL (ref 78.0–100.0)
Platelets: 209 10*3/uL (ref 150–400)
RBC: 3.55 MIL/uL — ABNORMAL LOW (ref 3.87–5.11)
RDW: 21.8 % — AB (ref 11.5–15.5)
WBC: 10.5 10*3/uL (ref 4.0–10.5)

## 2014-10-16 LAB — HEPARIN LEVEL (UNFRACTIONATED)
Heparin Unfractionated: 1 IU/mL — ABNORMAL HIGH (ref 0.30–0.70)
Heparin Unfractionated: <0.1 IU/mL — ABNORMAL LOW (ref 0.30–0.70)

## 2014-10-16 MED ORDER — HEPARIN (PORCINE) IN NACL 100-0.45 UNIT/ML-% IJ SOLN
1400.0000 [IU]/h | INTRAMUSCULAR | Status: DC
  Administered 2014-10-16: 900 [IU]/h via INTRAVENOUS
  Filled 2014-10-16: qty 250

## 2014-10-16 NOTE — Progress Notes (Signed)
Name: [name] MRN: [id number] DOB: [birth year]    ADMISSION DATE:  10/10/2014 CONSULTATION DATE:  10/14/2014  REFERRING MD :  Dr. Tana Coast  CHIEF COMPLAINT:  syncope  BRIEF PATIENT DESCRIPTION:  74 year old female with PMH as below. She was admitted to [facility] ED 09/27/14 with bilateral PE and received thrombolysis via EKOS on 4/9. Heprain gtt was eventually transitioned to xarelto, however, she developed periorbital ecchymosis. Xarelto was D/c'd and IVF filter was placed. She was discharged to home 4/14. 4/21 she again presented to [facility] c/o orthostasis and syncope. She was admitted and treated initially with IVF, which resulted in massive lower extremity edema. Echocardiogram has normalized since prior admission. Based on relatively normal heart and kidney function there is concern that her IVC filter may be obstructing. PCCM asked to consult.     SIGNIFICANT EVENTS  4/09>>>EKOS 4/10>> 1uRBC transfusion 4/12 increased periorbital ecchymosis 4/13 IVC filter placed 4/21 admitted for syncope  STUDIES:  4/08 CT chest >> Moderate burden of bilateral pulmonary emboli. Positive for acute PE with CT evidence of right heart strain (RV/LV Ratio = 1.9).  4/08 Echo >> EF 75%, mod RV dilation with decreased systolic fx, PAS 61 mmHg, mod/severe TR 4/09 LE dopplers >>> acute DVT in rt popliteal vein, rt TB vein, and rt peroneal vein 4/21 CT head > No acute abnormalities 4/22 Renal US > No hydronephrosis. No renal calculi. Bilateral mild renal cortical thinning probable due to atrophy. 4/22 Echo > LVEF 60-65%, Grade 1 DD.  Improvement from prior study. 4/23 MRI brain > No acute intracranial abnormality.  SUBJECTIVE:  10/15/2014: Diagnosis of Acute DVT on duplex. CT abdomen  shows distal to IVC - dvt likely. She is in bed. Periorbital echymmoses nearly resolved from prior admit. Denies bleeding here or at home. No visual issues. No neuro deficits.   VITAL SIGNS: Temp:  [98.4 F (36.9 C)-98.8 F (37.1  C)] 98.7 F (37.1 C) (04/27 0500) Pulse Rate:  [98-105] 98 (04/27 0500) Resp:  [16-22] 16 (04/27 0500) SpO2:  [94 %-97 %] 95 % (04/27 0500)  PHYSICAL EXAMINATION: General:  74 year old female in NAD, complains"I cant lift my left leg, not new complaint) Neuro:  Alert, oriented, non-focal HEENT:  [place]/AT, No JVD noted, PERRL Cardiovascular:  RRR, no MRG Lungs:  Clear bilateral breath sounds. Somewhat diminished R Abdomen:  Soft, non-tender, non-distended Musculoskeletal:  BLE edema and pain R > L  -out of  SCDs Skin:  Grossly intact  PULMONARY No results for input(s): PHART, PCO2ART, PO2ART, HCO3, TCO2, O2SAT in the last 168 hours.  Invalid input(s): PCO2, PO2  CBC  Recent Labs Lab 10/11/14 1055 10/15/14 2231 10/16/14 0840  HGB 10.2* 9.3* 9.1*  HCT 33.8* 30.5* 29.3*  WBC 8.2 12.4* 10.5  PLT 199 237 PENDING    COAGULATION  Recent Labs Lab 10/10/14 2052  INR 1.09    CARDIAC    Recent Labs Lab 10/12/14 1232 10/12/14 1645 10/12/14 2330  TROPONINI <0.03 <0.03 <0.03   No results for input(s): PROBNP in the last 168 hours.   CHEMISTRY  Recent Labs Lab 10/10/14 2052 10/11/14 1055 10/12/14 0530 10/15/14 2231 10/16/14 0840  NA 138 140 138 130* 135  K 4.3 3.7 4.4 4.2 3.8  CL 102 108 105 97 100  CO2 23 22 23 22 24   GLUCOSE 172* 125* 121* 146* 119*  BUN 16 18 15 11 10   CREATININE 1.73* 1.10 0.88 0.79 0.72  CALCIUM 9.8 8.9 9.5 8.5 8.7  Estimated Creatinine Clearance: 70.6 mL/min (by C-G formula based on Cr of 0.72).   LIVER  Recent Labs Lab 10/10/14 2052 10/11/14 1055  AST 30 23  ALT 19 15  ALKPHOS 68 55  BILITOT 0.6 0.6  PROT 6.8 5.5*  ALBUMIN 3.8 3.1*  INR 1.09  --      INFECTIOUS No results for input(s): LATICACIDVEN, PROCALCITON in the last 168 hours.   ENDOCRINE CBG (last 3)  No results for input(s): GLUCAP in the last 72 hours.       IMAGING x48h - images reviewed personally  Ct Angio Chest Pe W/cm &/or Wo  Cm  10/14/2014   CLINICAL DATA:  Recent right breast cancer surgery with 3 positive lymph nodes. History of pulmonary embolism, DVT and IVC filter placement.  EXAM: CT ANGIOGRAPHY CHEST  CT ABDOMEN AND PELVIS WITH CONTRAST  TECHNIQUE: Multidetector CT imaging of the chest was performed using the standard protocol during bolus administration of intravenous contrast. Multiplanar CT image reconstructions and MIPs were obtained to evaluate the vascular anatomy. Multidetector CT imaging of the abdomen and pelvis was performed using the standard protocol during bolus administration of intravenous contrast.  CONTRAST:  71mL OMNIPAQUE IOHEXOL 350 MG/ML SOLN  COMPARISON:  Mid chest CTA 09/27/2014. Abdominal pelvic CT 08/09/2013.  FINDINGS: CTA CHEST FINDINGS  Mediastinum: The pulmonary arteries are well opacified with contrast. There has been interval significant lysis of the previously demonstrated acute pulmonary embolism bilaterally. There is a small amount of residual nonocclusive thromboembolic disease within the segmental and subsegmental branches of the pulmonary arteries. The RV to LV ratio has nearly normalized (1.06). Left arm PICC the tip is in the lower SVC.Mild atherosclerosis appears stable. There are no enlarged mediastinal, hilar or axillary lymph nodes. There is a moderate size hiatal hernia.  Lungs/Pleura: There is no pleural effusion.There is mildly increased dependent atelectasis at both lung bases. No confluent airspace opacity or suspicious nodule.  Musculoskeletal/Chest wall: Bilateral breast implants/tissue expanders noted. No evidence of chest wall mass or suspicious osseous finding.  CT ABDOMEN and PELVIS FINDINGS  Hepatobiliary: Portal phase images through the abdomen are motion degraded. The liver demonstrates mild steatosis, but no focal lesion. No evidence of gallstones, gallbladder wall thickening or biliary dilatation.  Pancreas: Unremarkable. No pancreatic ductal dilatation or surrounding  inflammatory changes.  Spleen: Normal in size without focal abnormality.  Adrenals/Urinary Tract: Both adrenal glands appear normal.There is a stable small low-density lesion posteriorly in the mid left kidney, best seen on image 11 of series 601. No evidence of hydronephrosis. The bladder appears normal.  Stomach/Bowel: No evidence of bowel wall thickening, distention or surrounding inflammatory change.Mild sigmoid diverticulosis.  Vascular/Lymphatic: There are no enlarged abdominal or pelvic lymph nodes. Mild aortoiliac atherosclerosis. Interval IVC filter placement inferior to the renal veins. There is limited contrast opacification of the infrarenal IVC and pelvic veins.  Reproductive: Probable uterine fibroid as before.  No adnexal mass.  Other: Interval development of subcutaneous edema in both flanks and proximal thighs. There is also mild mesenteric edema with a small amount of pelvic ascites.  Musculoskeletal: No acute or significant osseous findings.  Review of the MIP images confirms the above findings.  IMPRESSION: 1. Interval successful near-complete lysis of previously demonstrated bilateral pulmonary emboli. Signs of right heart strain have nearly completely resolved. 2. IVC filter placement with normal opacification of the suprarenal IVC. The infrarenal IVC and pelvic veins are suboptimally opacified. 3. Anasarca with generalized subcutaneous edema in the flanks and thighs and  minimal ascites. 4. No evidence of metastatic breast cancer.   Electronically Signed   By: Richardean Sale M.D.   On: 10/14/2014 14:10   Ct Abdomen Pelvis W Contrast  10/15/2014   CLINICAL DATA:  Evaluate IVC filter  EXAM: CT ABDOMEN AND PELVIS WITH CONTRAST  TECHNIQUE: Multidetector CT imaging of the abdomen and pelvis was performed using the standard protocol following bolus administration of intravenous contrast.  CONTRAST:  171mL OMNIPAQUE IOHEXOL 350 MG/ML SOLN  COMPARISON:  10/14/2014  FINDINGS: An IVC filter is stable  in position. There is low-density within the filter as well as extending above the filter 12 mm. Bilateral iliac veins are somewhat distended and with heterogeneous density. These findings are worrisome for thrombus within the filter and extending just above the filter. Iliac veins are probably Ing cord sheet and dilated secondary to venous hypertension. Thrombus in the iliac venous system is not excluded.  Mild bibasilar atelectasis.  Hiatal hernia.  Diffuse hepatic steatosis.  Spleen, adrenal gland are within normal limits. Stable appearance of the kidneys.  There is a 21 x 11 mm low-density lesion at the junction of the body and head of the pancreas. This was poorly visualized yesterday secondary to motion artifact.  Stranding and edema within the subcutaneous fat throughout the abdomen and pelvis is stable.  Uterus remains heterogeneous  Bladder is decompressed.  Sigmoid diverticulosis without diverticulitis.  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  IMPRESSION: Findings are worrisome for thrombus within the IVC filter and extending just above the filter. Thrombus in the iliac venous system cannot be excluded. The patient does have bilateral lower extremity DVT diagnosed by Doppler ultrasound. Dedicated venogram can be performed to further delineate.  There is a lesion within the junction of the body and head of the pancreas as described. It is low-density. Malignancy is not excluded. MRI of the pancreas is recommended to further delineate.  Other findings are stable compared with yesterday.   Electronically Signed   By: Marybelle Killings M.D.   On: 10/15/2014 11:07   Ct Abdomen Pelvis W Contrast  10/14/2014   CLINICAL DATA:  Recent right breast cancer surgery with 3 positive lymph nodes. History of pulmonary embolism, DVT and IVC filter placement.  EXAM: CT ANGIOGRAPHY CHEST  CT ABDOMEN AND PELVIS WITH CONTRAST  TECHNIQUE: Multidetector CT imaging of the chest was performed using the standard  protocol during bolus administration of intravenous contrast. Multiplanar CT image reconstructions and MIPs were obtained to evaluate the vascular anatomy. Multidetector CT imaging of the abdomen and pelvis was performed using the standard protocol during bolus administration of intravenous contrast.  CONTRAST:  75mL OMNIPAQUE IOHEXOL 350 MG/ML SOLN  COMPARISON:  Mid chest CTA 09/27/2014. Abdominal pelvic CT 08/09/2013.  FINDINGS: CTA CHEST FINDINGS  Mediastinum: The pulmonary arteries are well opacified with contrast. There has been interval significant lysis of the previously demonstrated acute pulmonary embolism bilaterally. There is a small amount of residual nonocclusive thromboembolic disease within the segmental and subsegmental branches of the pulmonary arteries. The RV to LV ratio has nearly normalized (1.06). Left arm PICC the tip is in the lower SVC.Mild atherosclerosis appears stable. There are no enlarged mediastinal, hilar or axillary lymph nodes. There is a moderate size hiatal hernia.  Lungs/Pleura: There is no pleural effusion.There is mildly increased dependent atelectasis at both lung bases. No confluent airspace opacity or suspicious nodule.  Musculoskeletal/Chest wall: Bilateral breast implants/tissue expanders noted. No evidence of chest wall mass or suspicious osseous finding.  CT ABDOMEN and PELVIS FINDINGS  Hepatobiliary: Portal phase images through the abdomen are motion degraded. The liver demonstrates mild steatosis, but no focal lesion. No evidence of gallstones, gallbladder wall thickening or biliary dilatation.  Pancreas: Unremarkable. No pancreatic ductal dilatation or surrounding inflammatory changes.  Spleen: Normal in size without focal abnormality.  Adrenals/Urinary Tract: Both adrenal glands appear normal.There is a stable small low-density lesion posteriorly in the mid left kidney, best seen on image 11 of series 601. No evidence of hydronephrosis. The bladder appears normal.   Stomach/Bowel: No evidence of bowel wall thickening, distention or surrounding inflammatory change.Mild sigmoid diverticulosis.  Vascular/Lymphatic: There are no enlarged abdominal or pelvic lymph nodes. Mild aortoiliac atherosclerosis. Interval IVC filter placement inferior to the renal veins. There is limited contrast opacification of the infrarenal IVC and pelvic veins.  Reproductive: Probable uterine fibroid as before.  No adnexal mass.  Other: Interval development of subcutaneous edema in both flanks and proximal thighs. There is also mild mesenteric edema with a small amount of pelvic ascites.  Musculoskeletal: No acute or significant osseous findings.  Review of the MIP images confirms the above findings.  IMPRESSION: 1. Interval successful near-complete lysis of previously demonstrated bilateral pulmonary emboli. Signs of right heart strain have nearly completely resolved. 2. IVC filter placement with normal opacification of the suprarenal IVC. The infrarenal IVC and pelvic veins are suboptimally opacified. 3. Anasarca with generalized subcutaneous edema in the flanks and thighs and minimal ascites. 4. No evidence of metastatic breast cancer.   Electronically Signed   By: Richardean Sale M.D.   On: 10/14/2014 14:10     ECHO - RV strain resolved    ASSESSMENT / PLAN: # Bilateral pulmonary emboli s/p EKOS, IVF filter placement 4/13 due to periorbital bleed and oral anticoagulation held  #Readmit 10/10/2014 Syncope,  2nd to IVC filter  Distal thrombosis becuae repeat CT   - shows near reolsution of PE and ECHO shows normalization of RV pressures.   - Duplex LE with Acute DVT and CTabd with thrombosis distal to IVC  - No evidence of bleeding since discharge: in fact HGb improved   PLAN  - Restart anticoagulation - she has no other choice due to acute DVT distal to IVC filter - she is very hypercoagulable at this point due to her cancer hx and recent submassive PE hx   -  Unless major  bleeding have advised her she needs anticoagulation  - Start IV heparin for 3-5 days as inpatient  - Then start NOAC (possibly Eliquis) prior to discharge  - Do NOT REMOVE IVC filter now - clot can dislodge and cause PE; will aim for this in several weeks to a few months   - Monitor orthostatics  PCCM will see daily  Richardson Landry Mark Benecke ACNP Maryanna Shape PCCM Pager [phone] till 3 pm If no answer page [phone] 10/16/2014, 10:55 AM

## 2014-10-16 NOTE — Progress Notes (Signed)
Benefits Check with Eliquis:    ---10/16/2014 1358 by Memory Argue--- S/W W[facility] @ [facility] # [phone]  ELIQUIS  5MG  BID    **  NO RX COVERAGE **  **  PLEASE CHECK WITH PT'S **

## 2014-10-16 NOTE — Telephone Encounter (Signed)
Patient had called in to cancel her appointment as she is in the hospital,done and returned her call to confirm that i cancelled it

## 2014-10-16 NOTE — Progress Notes (Signed)
OT Cancellation Note  Patient Details Name: [name] MRN: [id number] DOB: 26-Sep-1940   Cancelled Treatment:    Reason Eval/Treat Not Completed: Medical issues which prohibited therapy. New sites located with DVTs. Patient's therapeutic range for Heparin (0.3-0.7 units/ml) is currently out of range per most recent lab report. Will hold therapy at this time per departmental protocol. Of note, patient to have surgery tomorrow 4/28 for possible thrombectomy in IR.  Benito Mccreedy OTR/L [phone] 10/16/2014, 4:50 PM

## 2014-10-16 NOTE — Progress Notes (Signed)
NCM sent benefit check in to see if ble venous venogram with probable thrombolysis/thrombectomy is covered by patient's insurance, await benefit check.

## 2014-10-16 NOTE — Progress Notes (Addendum)
Patient ID: [name], female   DOB: 20-Feb-1941, 74 y.o.   MRN: [id number]   Pt has been tentatively scheduled for BLE venous venogram with probable thrombolysis/thrombectomy in IR 4/28  See notes from Pulmonary; Hematology and IR  Pt is concerned if procedure is covered by insurance I have told pt that I would have scheduler look into this asap  I have spoken to Dr Chase Caller regarding this issue. He is placing order for case management to help determine if covered.  Will check in am Pt will need to sign consent to move forward   She is aware I have spoken to pharmacist regarding tpa infusion repeat after initial use just 09/28/2014. Pharmacy see no contraindication to re use this soon.

## 2014-10-16 NOTE — Progress Notes (Signed)
ANTICOAGULATION CONSULT NOTE - Follow Up Consult  Pharmacy Consult for Heparin  Indication: pulmonary embolus and DVT  No Known Allergies  Patient Measurements: Height: 5\' 5"  (165.1 cm) Weight: 199 lb 4.7 oz (90.4 kg) IBW/kg (Calculated) : 57 Vital Signs: Temp: 98.7 F (37.1 C) (04/27 0500) Temp Source: Oral (04/27 0500) Pulse Rate: 98 (04/27 0500)  Labs:  Recent Labs  10/15/14 2231 10/16/14 0840  HGB 9.3* 9.1*  HCT 30.5* 29.3*  PLT 237 PENDING  HEPARINUNFRC 0.33 1.00*  CREATININE 0.79 0.72    Estimated Creatinine Clearance: 70.6 mL/min (by C-G formula based on Cr of 0.72).   Assessment: 76 YOF with h/o of bilateral PE and new bilateral distal DVTs on IV heparin.  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 IR to perform local lysis tomorrow. Patient has an IVC filter in place which will remain in place for a few more months to avoid dislodging a clot.  Patient is currently on IV heparin 1100 units/hr and has been therapeutic till yesterday. Heparin level had risen significantly to 1 today despite no changes in infusion rate. Lab was drawn as a foot stick today which should not affect heparin levels. No bleeding noted yet.   Goal of Therapy:  Heparin level 0.3-0.7 units/ml Monitor platelets by anticoagulation protocol: Yes   Plan:  -Hold heparin x 1 hour, then restart at 900 units/hr  -F/u 8 hr HL  -Daily CBC/HL -Monitor for bleeding, trend Hgb  Albertina Parr, PharmD., BCPS Clinical Pharmacist Pager [phone]

## 2014-10-16 NOTE — Progress Notes (Signed)
[facility]                                                                              Patient Demographics  [name], is a 74 y.o. female, DOB - [birth year], GEX:[id number]  Admit date - 10/10/2014   Admitting Physician Ivor Costa, MD  Outpatient Primary MD for the patient is Osborne Casco, MD  LOS - 6   Chief Complaint  Patient presents with  . Loss of Consciousness       Brief HPI   Patient is a 75 year old female with hypertension, hyperlipidemia, GERD, right breast cancer (post status of surgery and radiation therapy), history of pulmonary embolism (with R heart straining by 2D echo on 09/27/14), history of right lower leg DVT, hx of retinal bleeding, recent IVC filter placement on 10/02/14, was entered from home with a syncopal episode. Patient reported that about about 3 PM, she started feeling clammy, sweating and cold, dizzy and lightheaded. She felt that she was going to pass out, but did not. She was doing okay until 6 PM, when she vomited twice, denied any hematemesis and then subsequently had a syncopal episode. She strongly denied having seizure and unilateral weakness. No chest pain. She has mild shortness of breath due to pulmonary embolism, which is at her baseline. She still has mild swelling in the R lower leg due to DVT.  In ED, patient was found to have AKI with Cr 1.73, negative troponin, BNP 66, normal temperature, mild tachycardia, WBC 11.3, INR1.09. CT head is negative for acute abnormalities. EKG showed tachycardia and low voltage, no ischemic change. Patient was admitted to inpatient for further evaluation and treatment  4/23: Patient complaining of dizziness, orthostatic vitals positive  Assessment & Plan    Recurrent Syncope: Possibly due to low flow state from a right heart strain with bilateral pulmonary embolism, ? IVC obstruction, acute bilateral DVT. Patient had recent bilateral PE and had received thrombolysis via EKOS on  4/9. Patient however did not tolerate oral anticoagulation and had developed periorbital ecchymosis. - CT head negative for acute abnormalities, no focal neurological deficits - 2-D echo showed EF of 01-02%, grade 1 diastolic dysfunction, no right heart failure - PTOT evaluation-> no PT follow-up needed - cortisol, vitamin B12, folate level all within normal limits - MRI of the brain negative for any posterior circulation ischemia - Cardiology was consulted, multiple imagings done. Duplex of the lower extremities showed acute DVT bilateral lower extremities. CT angiogram of the chest showed near resolution of pulmonary embolism. However CT abdomen showed a thrombosis distal to IVC. - Seen by Dr. Chase Caller: recommended starting patient on IV heparin drip 3-5 days as inpatient, if remains stable with no bleeding, then start NOAC (eliquis) prior to the discharge. Also do not remove IVC filter now as patient has clots distal to IVC which can dislodge and cause further pulmonary embolism. -IR to see about possible thrombolysis   recent Pulmonary embolism: Likely from tamoxifen - please refer to #1   Acute kidney injury:  Arroyo likely due to dehydration secondary to vomiting-> resolved  - Creatinine 1.73 at the time of admission, renal ultrasound  negative for acute obstruction or hydronephrosis. - Lisinopril discontinued.    GERD: -Continue Pepcid  Hyperlipidemia: - Patient not on any statins  Nausea and vomiting: Etiology is not clear, resolved - Lipase is 82 however patient does not have any symptoms of acute pancreatitis. She has  no abdominal pain, tolerating regular diet   Right breast cancer:  status post XRT and surgery. Patient used to take tamoxifen, which was discontinued because of DVT and pulmonary embolism. -follow up with Dr. Lindi Adie  Code Status: Full code   Family Communication: patient and husband at the bedside   Disposition Plan:    Time Spent in minutes  25  minutes  Procedures  CT head MRI of the brain 2-D echocardiogram  Consults   Cardiology Pulmonology  DVT Prophylaxis  heparin subcutaneous  Medications  Scheduled Meds: . acidophilus  1 capsule Oral Daily  . calcium carbonate  1,500 mg of elemental calcium Oral Q breakfast  . cholecalciferol  5,000 Units Oral Daily  . famotidine  20 mg Oral Daily  . iron polysaccharides  150 mg Oral Daily  . multivitamin with minerals  1 tablet Oral Daily  . omega-3 acid ethyl esters  1 g Oral Daily  . sodium chloride  3 mL Intravenous Q12H  . vitamin C  250 mg Oral Daily  . vitamin E  100 Units Oral Daily   Continuous Infusions: . heparin     PRN Meds:.alum & mag hydroxide-simeth, morphine injection, ondansetron (ZOFRAN) IV, ondansetron **OR** ondansetron (ZOFRAN) IV, sodium chloride   Antibiotics   Anti-infectives    None        Subjective:   [name] c/o b/l LE swelling -says she is having trouble moving her legs- no back pain   Objective:   Blood pressure 139/73, pulse 98, temperature 98.7 F (37.1 C), temperature source Oral, resp. rate 16, height 5\' 5"  (1.651 m), weight 90.4 kg (199 lb 4.7 oz), SpO2 95 %.  Wt Readings from Last 3 Encounters:  10/11/14 90.4 kg (199 lb 4.7 oz)  10/02/14 95.6 kg (210 lb 12.2 oz)  05/08/14 93.849 kg (206 lb 14.4 oz)     Intake/Output Summary (Last 24 hours) at 10/16/14 1033 Last data filed at 10/16/14 1004  Gross per 24 hour  Intake    480 ml  Output    750 ml  Net   -270 ml    Exam  General: Alert and oriented x 3, NAD  CVS: S1 S2 clear, no MRG  Respiratory:  CTA B  Abdomen: Soft, NT, ND, NBS  Ext: b/l swelling   Data Review   Micro Results Recent Results (from the past 240 hour(s))  Urine culture     Status: None   Collection Time: 10/11/14  8:34 AM  Result Value Ref Range Status   Specimen Description URINE, CLEAN CATCH  Final   Special Requests NONE  Final   Colony Count   Final    >=100,000  COLONIES/ML Performed at [facility]    Culture   Final    Multiple bacterial morphotypes present, none predominant. Suggest appropriate recollection if clinically indicated. Performed at [facility]    Report Status 10/13/2014 FINAL  Final    Radiology Reports Dg Chest 2 View  09/27/2014   CLINICAL DATA:  Shortness of breath for a few days.  EXAM: CHEST  2 VIEW  COMPARISON:  PA and lateral chest 07/26/2013.  FINDINGS: The lungs are clear. Heart size is normal. No  pneumothorax or pleural effusion. Small hiatal hernia is noted. Calcified breast implants seen on the prior study are no longer visualized.  IMPRESSION: No acute disease.  Small hiatal hernia.   Electronically Signed   By: Inge Rise M.D.   On: 09/27/2014 11:10   Ct Head Wo Contrast  10/10/2014   CLINICAL DATA:  Admitted for multiple blood clots. Syncopal episode today at home.  EXAM: CT HEAD WITHOUT CONTRAST  TECHNIQUE: Contiguous axial images were obtained from the base of the skull through the vertex without intravenous contrast.  COMPARISON:  09/28/2014; 12/27/2007  FINDINGS: The gray-white differentiation is maintained. No CT evidence of acute large territory infarct. No intraparenchymal or extra-axial mass or hemorrhage. Unchanged size and configuration of the ventricles and basilar cisterns. No midline shift. There is scattered polypoid mucosal thickening of the right posterior ethmoidal air cells as well as the left sphenoid sinus. The remaining paranasal sinuses and mastoid air cells appear normally aerated. No air-fluid levels. Old right-sided nasal bone fracture (representative image 14, series 3). No acutely displaced calvarial fracture. Regional soft tissues appear normal.  IMPRESSION: Negative noncontrast head CT.   Electronically Signed   By: Sandi Mariscal M.D.   On: 10/10/2014 22:50   Ct Head Wo Contrast  09/28/2014   CLINICAL DATA:  Acute pulmonary embolism. No history of breast cancer.  EXAM: CT HEAD  WITHOUT CONTRAST  TECHNIQUE: Contiguous axial images were obtained from the base of the skull through the vertex without intravenous contrast.  COMPARISON:  Head CT 12/27/2007  FINDINGS: All No acute intracranial hemorrhage. No focal mass lesion. No CT evidence of acute infarction. No midline shift or mass effect. No hydrocephalus. Basilar cisterns are patent. Paranasal sinuses and mastoid air cells are clear. Mild periventricular white matter hypodensities.  IMPRESSION: No acute intracranial findings.  Mild white matter microvascular change.   Electronically Signed   By: Suzy Bouchard M.D.   On: 09/28/2014 11:09   Ct Angio Chest Pe W/cm &/or Wo Cm  10/14/2014   CLINICAL DATA:  Recent right breast cancer surgery with 3 positive lymph nodes. History of pulmonary embolism, DVT and IVC filter placement.  EXAM: CT ANGIOGRAPHY CHEST  CT ABDOMEN AND PELVIS WITH CONTRAST  TECHNIQUE: Multidetector CT imaging of the chest was performed using the standard protocol during bolus administration of intravenous contrast. Multiplanar CT image reconstructions and MIPs were obtained to evaluate the vascular anatomy. Multidetector CT imaging of the abdomen and pelvis was performed using the standard protocol during bolus administration of intravenous contrast.  CONTRAST:  71mL OMNIPAQUE IOHEXOL 350 MG/ML SOLN  COMPARISON:  Mid chest CTA 09/27/2014. Abdominal pelvic CT 08/09/2013.  FINDINGS: CTA CHEST FINDINGS  Mediastinum: The pulmonary arteries are well opacified with contrast. There has been interval significant lysis of the previously demonstrated acute pulmonary embolism bilaterally. There is a small amount of residual nonocclusive thromboembolic disease within the segmental and subsegmental branches of the pulmonary arteries. The RV to LV ratio has nearly normalized (1.06). Left arm PICC the tip is in the lower SVC.Mild atherosclerosis appears stable. There are no enlarged mediastinal, hilar or axillary lymph nodes. There  is a moderate size hiatal hernia.  Lungs/Pleura: There is no pleural effusion.There is mildly increased dependent atelectasis at both lung bases. No confluent airspace opacity or suspicious nodule.  Musculoskeletal/Chest wall: Bilateral breast implants/tissue expanders noted. No evidence of chest wall mass or suspicious osseous finding.  CT ABDOMEN and PELVIS FINDINGS  Hepatobiliary: Portal phase images through the abdomen are motion  degraded. The liver demonstrates mild steatosis, but no focal lesion. No evidence of gallstones, gallbladder wall thickening or biliary dilatation.  Pancreas: Unremarkable. No pancreatic ductal dilatation or surrounding inflammatory changes.  Spleen: Normal in size without focal abnormality.  Adrenals/Urinary Tract: Both adrenal glands appear normal.There is a stable small low-density lesion posteriorly in the mid left kidney, best seen on image 11 of series 601. No evidence of hydronephrosis. The bladder appears normal.  Stomach/Bowel: No evidence of bowel wall thickening, distention or surrounding inflammatory change.Mild sigmoid diverticulosis.  Vascular/Lymphatic: There are no enlarged abdominal or pelvic lymph nodes. Mild aortoiliac atherosclerosis. Interval IVC filter placement inferior to the renal veins. There is limited contrast opacification of the infrarenal IVC and pelvic veins.  Reproductive: Probable uterine fibroid as before.  No adnexal mass.  Other: Interval development of subcutaneous edema in both flanks and proximal thighs. There is also mild mesenteric edema with a small amount of pelvic ascites.  Musculoskeletal: No acute or significant osseous findings.  Review of the MIP images confirms the above findings.  IMPRESSION: 1. Interval successful near-complete lysis of previously demonstrated bilateral pulmonary emboli. Signs of right heart strain have nearly completely resolved. 2. IVC filter placement with normal opacification of the suprarenal IVC. The infrarenal  IVC and pelvic veins are suboptimally opacified. 3. Anasarca with generalized subcutaneous edema in the flanks and thighs and minimal ascites. 4. No evidence of metastatic breast cancer.   Electronically Signed   By: Richardean Sale M.D.   On: 10/14/2014 14:10   Ct Angio Chest Pe W/cm &/or Wo Cm  09/27/2014   CLINICAL DATA:  73YOF today pt was on the toilet and passed out. Small hematoma to head. [facility] reported that pt was orthostatic and there was a loss of radial pulse while standing. History of right breast cancer.  EXAM: CT ANGIOGRAPHY CHEST WITH CONTRAST  TECHNIQUE: Multidetector CT imaging of the chest was performed using the standard protocol during bolus administration of intravenous contrast. Multiplanar CT image reconstructions and MIPs were obtained to evaluate the vascular anatomy.  CONTRAST:  6mL OMNIPAQUE IOHEXOL 350 MG/ML SOLN  COMPARISON:  CT abdomen 08/09/2013 and chest x-ray today.  FINDINGS: Lungs are clear.  Airways are normal.  Heart is normal in size. Pulmonary arterial system is well opacified and demonstrates moderate burden of emboli over the origin of the upper and lower lobar pulmonary arteries bilaterally as well as the proximal lingular and right middle lobe pulmonary arteries. There is evidence of right heart strain with elevated RV/LV ratio of 50.5 mm/27.2 mm = 1.9 (normal less than 0.9).  There is a moderate size hiatal hernia. Remaining mediastinal structures are unremarkable. Bilateral breast implants present.  Images through the upper abdomen demonstrate a 3 mm calcification over the upper pole left kidney. Mild degenerative change of the spine. No acute fractures.  Review of the MIP images confirms the above findings.  IMPRESSION: Moderate burden of bilateral pulmonary emboli. Positive for acute PE with CT evidence of right heart strain (RV/LV Ratio = 1.9) consistent with at least submassive (intermediate risk) PE. The presence of right heart strain has been associated with an  increased risk of morbidity and mortality. Please activate Code PE by paging [phone].  Moderate size hiatal hernia.  3 mm calcification over the upper pole left renal cortex.  Critical Value/emergent results were called by telephone at the time of interpretation on 09/27/2014 at 1:33 pm to Dr. Blanchie Dessert , who verbally acknowledged these results.   Electronically Signed  By: Marin Olp M.D.   On: 09/27/2014 13:34   Mr Brain Wo Contrast  10/12/2014   CLINICAL DATA:  Episode of feeling clammy, diaphoretic, cold, dizzy, and lightheaded beginning 4 hours ago. History of right lower extremity DVT and pulmonary embolism recently. IVC filter placement. Syncopal episode. Ortho static dizziness.  EXAM: MRI HEAD WITHOUT CONTRAST  TECHNIQUE: Multiplanar, multiecho pulse sequences of the brain and surrounding structures were obtained without intravenous contrast.  COMPARISON:  CT head without contrast 10/10/2014  FINDINGS: No acute infarct, hemorrhage, or mass lesion is present. Mild atrophy is present. Moderate periventricular and scattered subcortical T2 changes are noted bilaterally. The ventricles are proportionate to the degree of atrophy. Mild white matter changes extend into the brainstem.  The globes and orbits are intact. Mild mucosal thickening is present within the ethmoid air cells bilaterally. There are no fluid levels. The remaining paranasal sinuses are clear. There is fluid in the left mastoid air cells. No obstructing nasopharyngeal lesion is present.  IMPRESSION: 1. No acute intracranial abnormality. 2. Moderate periventricular and scattered subcortical T2 changes bilaterally. These are nonspecific, but likely reflects sequela of chronic microvascular ischemia. 3. Left mastoid effusion. No obstructing nasopharyngeal lesion is present.   Electronically Signed   By: San Morelle M.D.   On: 10/12/2014 19:14   Ct Abdomen Pelvis W Contrast  10/15/2014   CLINICAL DATA:  Evaluate IVC filter   EXAM: CT ABDOMEN AND PELVIS WITH CONTRAST  TECHNIQUE: Multidetector CT imaging of the abdomen and pelvis was performed using the standard protocol following bolus administration of intravenous contrast.  CONTRAST:  153mL OMNIPAQUE IOHEXOL 350 MG/ML SOLN  COMPARISON:  10/14/2014  FINDINGS: An IVC filter is stable in position. There is low-density within the filter as well as extending above the filter 12 mm. Bilateral iliac veins are somewhat distended and with heterogeneous density. These findings are worrisome for thrombus within the filter and extending just above the filter. Iliac veins are probably Ing cord sheet and dilated secondary to venous hypertension. Thrombus in the iliac venous system is not excluded.  Mild bibasilar atelectasis.  Hiatal hernia.  Diffuse hepatic steatosis.  Spleen, adrenal gland are within normal limits. Stable appearance of the kidneys.  There is a 21 x 11 mm low-density lesion at the junction of the body and head of the pancreas. This was poorly visualized yesterday secondary to motion artifact.  Stranding and edema within the subcutaneous fat throughout the abdomen and pelvis is stable.  Uterus remains heterogeneous  Bladder is decompressed.  Sigmoid diverticulosis without diverticulitis.  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  IMPRESSION: Findings are worrisome for thrombus within the IVC filter and extending just above the filter. Thrombus in the iliac venous system cannot be excluded. The patient does have bilateral lower extremity DVT diagnosed by Doppler ultrasound. Dedicated venogram can be performed to further delineate.  There is a lesion within the junction of the body and head of the pancreas as described. It is low-density. Malignancy is not excluded. MRI of the pancreas is recommended to further delineate.  Other findings are stable compared with yesterday.   Electronically Signed   By: Marybelle Killings M.D.   On: 10/15/2014 11:07   Ct Abdomen Pelvis W  Contrast  10/14/2014   CLINICAL DATA:  Recent right breast cancer surgery with 3 positive lymph nodes. History of pulmonary embolism, DVT and IVC filter placement.  EXAM: CT ANGIOGRAPHY CHEST  CT ABDOMEN AND PELVIS WITH CONTRAST  TECHNIQUE: Multidetector  CT imaging of the chest was performed using the standard protocol during bolus administration of intravenous contrast. Multiplanar CT image reconstructions and MIPs were obtained to evaluate the vascular anatomy. Multidetector CT imaging of the abdomen and pelvis was performed using the standard protocol during bolus administration of intravenous contrast.  CONTRAST:  17mL OMNIPAQUE IOHEXOL 350 MG/ML SOLN  COMPARISON:  Mid chest CTA 09/27/2014. Abdominal pelvic CT 08/09/2013.  FINDINGS: CTA CHEST FINDINGS  Mediastinum: The pulmonary arteries are well opacified with contrast. There has been interval significant lysis of the previously demonstrated acute pulmonary embolism bilaterally. There is a small amount of residual nonocclusive thromboembolic disease within the segmental and subsegmental branches of the pulmonary arteries. The RV to LV ratio has nearly normalized (1.06). Left arm PICC the tip is in the lower SVC.Mild atherosclerosis appears stable. There are no enlarged mediastinal, hilar or axillary lymph nodes. There is a moderate size hiatal hernia.  Lungs/Pleura: There is no pleural effusion.There is mildly increased dependent atelectasis at both lung bases. No confluent airspace opacity or suspicious nodule.  Musculoskeletal/Chest wall: Bilateral breast implants/tissue expanders noted. No evidence of chest wall mass or suspicious osseous finding.  CT ABDOMEN and PELVIS FINDINGS  Hepatobiliary: Portal phase images through the abdomen are motion degraded. The liver demonstrates mild steatosis, but no focal lesion. No evidence of gallstones, gallbladder wall thickening or biliary dilatation.  Pancreas: Unremarkable. No pancreatic ductal dilatation or  surrounding inflammatory changes.  Spleen: Normal in size without focal abnormality.  Adrenals/Urinary Tract: Both adrenal glands appear normal.There is a stable small low-density lesion posteriorly in the mid left kidney, best seen on image 11 of series 601. No evidence of hydronephrosis. The bladder appears normal.  Stomach/Bowel: No evidence of bowel wall thickening, distention or surrounding inflammatory change.Mild sigmoid diverticulosis.  Vascular/Lymphatic: There are no enlarged abdominal or pelvic lymph nodes. Mild aortoiliac atherosclerosis. Interval IVC filter placement inferior to the renal veins. There is limited contrast opacification of the infrarenal IVC and pelvic veins.  Reproductive: Probable uterine fibroid as before.  No adnexal mass.  Other: Interval development of subcutaneous edema in both flanks and proximal thighs. There is also mild mesenteric edema with a small amount of pelvic ascites.  Musculoskeletal: No acute or significant osseous findings.  Review of the MIP images confirms the above findings.  IMPRESSION: 1. Interval successful near-complete lysis of previously demonstrated bilateral pulmonary emboli. Signs of right heart strain have nearly completely resolved. 2. IVC filter placement with normal opacification of the suprarenal IVC. The infrarenal IVC and pelvic veins are suboptimally opacified. 3. Anasarca with generalized subcutaneous edema in the flanks and thighs and minimal ascites. 4. No evidence of metastatic breast cancer.   Electronically Signed   By: Richardean Sale M.D.   On: 10/14/2014 14:10   Ir Angiogram Pulmonary Bilateral Selective  09/29/2014   CLINICAL DATA:  Pulmonary thromboembolism.  Right heart strain.  EXAM: BILATERAL PULMONARY ARTERIOGRAPHY; ADDITIONAL ARTERIOGRAPHY; IR ULTRASOUND GUIDANCE VASC ACCESS RIGHT; IR INFUSION THROMBOL ARTERIAL INITIAL (MS)  FLUOROSCOPY TIME:  6 minutes 24 seconds.  MEDICATIONS AND MEDICAL HISTORY: Versed 1 mg, Fentanyl 50 mcg.   Additional Medications: None.  ANESTHESIA/SEDATION: Moderate sedation time: 30 minutes  CONTRAST:  50 cc Omnipaque 300  PROCEDURE: The procedure, risks, benefits, and alternatives were explained to the patient. Questions regarding the procedure were encouraged and answered. The patient understands and consents to the procedure.  The right groin was prepped with Betadine in a sterile fashion, and a sterile drape was applied covering  the operative field. A sterile gown and sterile gloves were used for the procedure.  Under sonographic guidance, a micropuncture needle was placed into the right common femoral vein and removed over a 018 wire which was up sized to a Bentson. A 7 French sheath was inserted. The identical procedure was performed in the same vein within additional 7 French sheath. A JB 1 catheter was advanced over a Bentson wire into the left pulmonary artery. Pressure and angiography was performed. Left pulmonary artery pressure was measured at 56/23 with a mean of 36 mm Hg. The catheter was removed over a longer Rosen wire. The identical procedure was performed into the right pulmonary artery through the other sheath. Right pulmonary artery pressure was measured at 50/23 with a mean of 31 mm Hg. Right and left 18 and 12 cm EKOS infusion catheters were then advanced into the right and left pulmonary arteries. TPA infusion was instituted.  FINDINGS: Imaging confirms right and left pulmonary artery catheter position in the descending branches. Contrast injected confirms positioning and pulmonary thromboembolism.  COMPLICATIONS: None  IMPRESSION: Successful he close bilateral pulmonary artery thrombolytic infusion.   Electronically Signed   By: Marybelle Killings M.D.   On: 09/29/2014 08:46   Ir Angiogram Selective Each Additional Vessel  09/29/2014   CLINICAL DATA:  Pulmonary thromboembolism.  Right heart strain.  EXAM: BILATERAL PULMONARY ARTERIOGRAPHY; ADDITIONAL ARTERIOGRAPHY; IR ULTRASOUND GUIDANCE VASC  ACCESS RIGHT; IR INFUSION THROMBOL ARTERIAL INITIAL (MS)  FLUOROSCOPY TIME:  6 minutes 24 seconds.  MEDICATIONS AND MEDICAL HISTORY: Versed 1 mg, Fentanyl 50 mcg.  Additional Medications: None.  ANESTHESIA/SEDATION: Moderate sedation time: 30 minutes  CONTRAST:  50 cc Omnipaque 300  PROCEDURE: The procedure, risks, benefits, and alternatives were explained to the patient. Questions regarding the procedure were encouraged and answered. The patient understands and consents to the procedure.  The right groin was prepped with Betadine in a sterile fashion, and a sterile drape was applied covering the operative field. A sterile gown and sterile gloves were used for the procedure.  Under sonographic guidance, a micropuncture needle was placed into the right common femoral vein and removed over a 018 wire which was up sized to a Bentson. A 7 French sheath was inserted. The identical procedure was performed in the same vein within additional 7 French sheath. A JB 1 catheter was advanced over a Bentson wire into the left pulmonary artery. Pressure and angiography was performed. Left pulmonary artery pressure was measured at 56/23 with a mean of 36 mm Hg. The catheter was removed over a longer Rosen wire. The identical procedure was performed into the right pulmonary artery through the other sheath. Right pulmonary artery pressure was measured at 50/23 with a mean of 31 mm Hg. Right and left 18 and 12 cm EKOS infusion catheters were then advanced into the right and left pulmonary arteries. TPA infusion was instituted.  FINDINGS: Imaging confirms right and left pulmonary artery catheter position in the descending branches. Contrast injected confirms positioning and pulmonary thromboembolism.  COMPLICATIONS: None  IMPRESSION: Successful he close bilateral pulmonary artery thrombolytic infusion.   Electronically Signed   By: Marybelle Killings M.D.   On: 09/29/2014 08:46   Ir Angiogram Selective Each Additional Vessel  09/29/2014    CLINICAL DATA:  Pulmonary thromboembolism.  Right heart strain.  EXAM: BILATERAL PULMONARY ARTERIOGRAPHY; ADDITIONAL ARTERIOGRAPHY; IR ULTRASOUND GUIDANCE VASC ACCESS RIGHT; IR INFUSION THROMBOL ARTERIAL INITIAL (MS)  FLUOROSCOPY TIME:  6 minutes 24 seconds.  MEDICATIONS AND MEDICAL HISTORY:  Versed 1 mg, Fentanyl 50 mcg.  Additional Medications: None.  ANESTHESIA/SEDATION: Moderate sedation time: 30 minutes  CONTRAST:  50 cc Omnipaque 300  PROCEDURE: The procedure, risks, benefits, and alternatives were explained to the patient. Questions regarding the procedure were encouraged and answered. The patient understands and consents to the procedure.  The right groin was prepped with Betadine in a sterile fashion, and a sterile drape was applied covering the operative field. A sterile gown and sterile gloves were used for the procedure.  Under sonographic guidance, a micropuncture needle was placed into the right common femoral vein and removed over a 018 wire which was up sized to a Bentson. A 7 French sheath was inserted. The identical procedure was performed in the same vein within additional 7 French sheath. A JB 1 catheter was advanced over a Bentson wire into the left pulmonary artery. Pressure and angiography was performed. Left pulmonary artery pressure was measured at 56/23 with a mean of 36 mm Hg. The catheter was removed over a longer Rosen wire. The identical procedure was performed into the right pulmonary artery through the other sheath. Right pulmonary artery pressure was measured at 50/23 with a mean of 31 mm Hg. Right and left 18 and 12 cm EKOS infusion catheters were then advanced into the right and left pulmonary arteries. TPA infusion was instituted.  FINDINGS: Imaging confirms right and left pulmonary artery catheter position in the descending branches. Contrast injected confirms positioning and pulmonary thromboembolism.  COMPLICATIONS: None  IMPRESSION: Successful he close bilateral pulmonary  artery thrombolytic infusion.   Electronically Signed   By: Marybelle Killings M.D.   On: 09/29/2014 08:46   Ir Ivc Filter Plmt / S&i /img Guid/mod Sed  10/02/2014   CLINICAL DATA:  Pulmonary thromboembolism. Peri-orbitall hemorrhage after anti coagulation.  EXAM: IVC FILTER,INFERIOR VENA CAVOGRAM  FLUOROSCOPY TIME:  1 minutes and 12 seconds.  MEDICATIONS AND MEDICAL HISTORY: Versed 1 mg, Fentanyl 50 mcg.  Additional Medications: None.  ANESTHESIA/SEDATION: Moderate sedation time: 15 minutes  CONTRAST:  40 cc Omnipaque 300  PROCEDURE: The procedure, risks, benefits, and alternatives were explained to the patient. Questions regarding the procedure were encouraged and answered. The patient understands and consents to the procedure.  The right neck was prepped with Betadine in a sterile fashion, and a sterile drape was applied covering the operative field. A sterile gown and sterile gloves were used for the procedure.  The Betadine vein was noted to be patent initially with ultrasound. Under sonographic guidance, a micropuncture needle was inserted into the right internal jugular vein (Ultrasound image documentation was performed). It was removed over an 018 wire which was upsized to a Miguel Barrera. The sheath was inserted over the wire and into the IVC. IVC venography was performed.  The temporary filter was then deployed in the infrarenal IVC. The sheath was removed and hemostasis was achieved with direct pressure.  FINDINGS: IVC venography confirms renal vein inflow at L1. No IVC thrombus or venous anomaly.  The image demonstrates placement of an IVC filter with its tip at the L1-2 disc.  COMPLICATIONS: None  IMPRESSION: Successful infrarenal IVC filter placement. This is a temporary filter. It can be removed or remain in place to become permanent.   Electronically Signed   By: Marybelle Killings M.D.   On: 10/02/2014 13:21   US Renal  10/11/2014   CLINICAL DATA:  Acute renal insufficiency  EXAM: RENAL/URINARY TRACT ULTRASOUND  COMPLETE  COMPARISON:  CT scan 2/ 19/ 15  FINDINGS:  Right Kidney:  Length: 9 mm. Echogenicity within normal limits. No mass or hydronephrosis visualized. Mild renal cortical thinning probable due to atrophy.  Left Kidney:  Length: 10.7 cm. Echogenicity within normal limits. No mass or hydronephrosis visualized. Mild renal cortical thinning probable due to atrophy.  Bladder:  Appears normal for degree of bladder distention.  IMPRESSION: 1. No hydronephrosis. No renal calculi. Bilateral mild renal cortical thinning probable due to atrophy. Unremarkable urinary bladder.   Electronically Signed   By: Lahoma Crocker M.D.   On: 10/11/2014 09:46   Ir US Guide Vasc Access Right  09/29/2014   CLINICAL DATA:  Pulmonary thromboembolism.  Right heart strain.  EXAM: BILATERAL PULMONARY ARTERIOGRAPHY; ADDITIONAL ARTERIOGRAPHY; IR ULTRASOUND GUIDANCE VASC ACCESS RIGHT; IR INFUSION THROMBOL ARTERIAL INITIAL (MS)  FLUOROSCOPY TIME:  6 minutes 24 seconds.  MEDICATIONS AND MEDICAL HISTORY: Versed 1 mg, Fentanyl 50 mcg.  Additional Medications: None.  ANESTHESIA/SEDATION: Moderate sedation time: 30 minutes  CONTRAST:  50 cc Omnipaque 300  PROCEDURE: The procedure, risks, benefits, and alternatives were explained to the patient. Questions regarding the procedure were encouraged and answered. The patient understands and consents to the procedure.  The right groin was prepped with Betadine in a sterile fashion, and a sterile drape was applied covering the operative field. A sterile gown and sterile gloves were used for the procedure.  Under sonographic guidance, a micropuncture needle was placed into the right common femoral vein and removed over a 018 wire which was up sized to a Bentson. A 7 French sheath was inserted. The identical procedure was performed in the same vein within additional 7 French sheath. A JB 1 catheter was advanced over a Bentson wire into the left pulmonary artery. Pressure and angiography was performed. Left  pulmonary artery pressure was measured at 56/23 with a mean of 36 mm Hg. The catheter was removed over a longer Rosen wire. The identical procedure was performed into the right pulmonary artery through the other sheath. Right pulmonary artery pressure was measured at 50/23 with a mean of 31 mm Hg. Right and left 18 and 12 cm EKOS infusion catheters were then advanced into the right and left pulmonary arteries. TPA infusion was instituted.  FINDINGS: Imaging confirms right and left pulmonary artery catheter position in the descending branches. Contrast injected confirms positioning and pulmonary thromboembolism.  COMPLICATIONS: None  IMPRESSION: Successful he close bilateral pulmonary artery thrombolytic infusion.   Electronically Signed   By: Marybelle Killings M.D.   On: 09/29/2014 08:46   Ir Infusion Thrombol Arterial Initial (ms)  09/29/2014   CLINICAL DATA:  Pulmonary thromboembolism.  Right heart strain.  EXAM: BILATERAL PULMONARY ARTERIOGRAPHY; ADDITIONAL ARTERIOGRAPHY; IR ULTRASOUND GUIDANCE VASC ACCESS RIGHT; IR INFUSION THROMBOL ARTERIAL INITIAL (MS)  FLUOROSCOPY TIME:  6 minutes 24 seconds.  MEDICATIONS AND MEDICAL HISTORY: Versed 1 mg, Fentanyl 50 mcg.  Additional Medications: None.  ANESTHESIA/SEDATION: Moderate sedation time: 30 minutes  CONTRAST:  50 cc Omnipaque 300  PROCEDURE: The procedure, risks, benefits, and alternatives were explained to the patient. Questions regarding the procedure were encouraged and answered. The patient understands and consents to the procedure.  The right groin was prepped with Betadine in a sterile fashion, and a sterile drape was applied covering the operative field. A sterile gown and sterile gloves were used for the procedure.  Under sonographic guidance, a micropuncture needle was placed into the right common femoral vein and removed over a 018 wire which was up sized to a Bentson. A 7 French sheath  was inserted. The identical procedure was performed in the same vein  within additional 7 French sheath. A JB 1 catheter was advanced over a Bentson wire into the left pulmonary artery. Pressure and angiography was performed. Left pulmonary artery pressure was measured at 56/23 with a mean of 36 mm Hg. The catheter was removed over a longer Rosen wire. The identical procedure was performed into the right pulmonary artery through the other sheath. Right pulmonary artery pressure was measured at 50/23 with a mean of 31 mm Hg. Right and left 18 and 12 cm EKOS infusion catheters were then advanced into the right and left pulmonary arteries. TPA infusion was instituted.  FINDINGS: Imaging confirms right and left pulmonary artery catheter position in the descending branches. Contrast injected confirms positioning and pulmonary thromboembolism.  COMPLICATIONS: None  IMPRESSION: Successful he close bilateral pulmonary artery thrombolytic infusion.   Electronically Signed   By: Marybelle Killings M.D.   On: 09/29/2014 08:46   Ir Jacolyn Reedy F/u Eval Art/ven Final Day (ms)  09/29/2014   CLINICAL DATA:  Follow-up EKOS pulmonary artery thrombolytic therapy.  EXAM: IR THROMB F/U EVAL ART/VEN FINAL DAY  FLUOROSCOPY TIME:  20 seconds.  MEDICATIONS AND MEDICAL HISTORY: None  ANESTHESIA/SEDATION: None  CONTRAST:  None  PROCEDURE: The procedure, risks, benefits, and alternatives were explained to the patient. Questions regarding the procedure were encouraged and answered. The patient understands and consents to the procedure.  Right and left pulmonary artery pressures were obtained. Right pulmonary artery pressure is 35/27 with a mean of 31 mm Hg. That of the left is 34/29 with a mean of 31 mm Hg. The catheters and sheaths were removed. Hemostasis was achieved with direct pressure and VPAD.  FINDINGS: Fluoroscopic imaging confirms stable position of the right and left pulmonary artery EKOS catheters.  COMPLICATIONS: None  IMPRESSION: Successful conclusion of bilateral pulmonary artery EKOS thrombo lytic  therapy. Peak systolic pressures have significantly improved from 56 to 35 mm Hg in the right pulmonary artery and 50 to 34 mm Hg in the left pulmonary artery.   Electronically Signed   By: Marybelle Killings M.D.   On: 09/29/2014 09:21    CBC  Recent Labs Lab 10/10/14 2052 10/11/14 1055 10/15/14 2231 10/16/14 0840  WBC 11.3* 8.2 12.4* 10.5  HGB 11.5* 10.2* 9.3* 9.1*  HCT 38.5 33.8* 30.5* 29.3*  PLT 257 199 237 PENDING  MCV 84.6 85.4 81.8 82.5  MCH 25.3* 25.8* 24.9* 25.6*  MCHC 29.9* 30.2 30.5 31.1  RDW 22.4* 22.7* 21.8* 21.8*  LYMPHSABS 0.6*  --   --   --   MONOABS 0.6  --   --   --   EOSABS 0.0  --   --   --   BASOSABS 0.0  --   --   --     Chemistries   Recent Labs Lab 10/10/14 2052 10/11/14 1055 10/12/14 0530 10/15/14 2231 10/16/14 0840  NA 138 140 138 130* 135  K 4.3 3.7 4.4 4.2 3.8  CL 102 108 105 97 100  CO2 23 22 23 22 24   GLUCOSE 172* 125* 121* 146* 119*  BUN 16 18 15 11 10   CREATININE 1.73* 1.10 0.88 0.79 0.72  CALCIUM 9.8 8.9 9.5 8.5 8.7  AST 30 23  --   --   --   ALT 19 15  --   --   --   ALKPHOS 68 55  --   --   --   BILITOT 0.6 0.6  --   --   --    ------------------------------------------------------------------------------------------------------------------  estimated creatinine clearance is 70.6 mL/min (by C-G formula based on Cr of 0.72). ------------------------------------------------------------------------------------------------------------------ No results for input(s): HGBA1C in the last 72 hours. ------------------------------------------------------------------------------------------------------------------ No results for input(s): CHOL, HDL, LDLCALC, TRIG, CHOLHDL, LDLDIRECT in the last 72 hours. ------------------------------------------------------------------------------------------------------------------ No results for input(s): TSH, T4TOTAL, T3FREE, THYROIDAB in the last 72 hours.  Invalid input(s):  FREET3 ------------------------------------------------------------------------------------------------------------------ No results for input(s): VITAMINB12, FOLATE, FERRITIN, TIBC, IRON, RETICCTPCT in the last 72 hours.  Coagulation profile  Recent Labs Lab 10/10/14 2052  INR 1.09    No results for input(s): DDIMER in the last 72 hours.  Cardiac Enzymes  Recent Labs Lab 10/12/14 1232 10/12/14 1645 10/12/14 2330  TROPONINI <0.03 <0.03 <0.03   ------------------------------------------------------------------------------------------------------------------ Invalid input(s): POCBNP  No results for input(s): GLUCAP in the last 72 hours.   VANN, JESSICA DO. [facility] 10/16/2014, 10:33 AM  Pager: [phone]     After 7pm go to [email or web address] - password [email or web address]  Call night coverage person covering after 7pm

## 2014-10-16 NOTE — Consult Note (Signed)
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 vein and removed over a 018 wire which was up sized to a Bentson. A 7 French sheath was inserted. The identical procedure was performed in the same vein within additional 7 French sheath. A JB 1 catheter was advanced over a Bentson wire into the left pulmonary artery. Pressure and angiography was performed. Left pulmonary artery pressure was measured at 56/23 with a mean of 36 mm Hg. The catheter was removed over a longer Rosen wire. The identical procedure was performed into the right pulmonary artery through the other sheath. Right pulmonary artery pressure was measured at 50/23 with a mean of 31 mm Hg. Right and left 18 and 12 cm EKOS infusion catheters were then advanced into the right and left pulmonary arteries. TPA infusion was instituted.  FINDINGS: Imaging confirms right and left pulmonary artery catheter position in the descending branches. Contrast injected confirms positioning and pulmonary thromboembolism.  COMPLICATIONS: None  IMPRESSION: Successful he close bilateral pulmonary artery thrombolytic infusion.   Electronically Signed   By: Marybelle Killings M.D.   On: 09/29/2014 08:46   Ir  Angiogram Selective Each Additional Vessel  09/29/2014   CLINICAL DATA:  Pulmonary thromboembolism.  Right heart strain.  EXAM: BILATERAL PULMONARY ARTERIOGRAPHY; ADDITIONAL ARTERIOGRAPHY; IR ULTRASOUND GUIDANCE VASC ACCESS RIGHT; IR INFUSION THROMBOL ARTERIAL INITIAL (MS)  FLUOROSCOPY TIME:  6 minutes 24 seconds.  MEDICATIONS AND  MEDICAL HISTORY: Versed 1 mg, Fentanyl 50 mcg.  Additional Medications: None.  ANESTHESIA/SEDATION: Moderate sedation time: 30 minutes  CONTRAST:  50 cc Omnipaque 300  PROCEDURE: The procedure, risks, benefits, and alternatives were explained to the patient. Questions regarding the procedure were encouraged and answered. The patient understands and consents to the procedure.  The right groin was prepped with Betadine in a sterile fashion, and a sterile drape was applied covering the operative field. A sterile gown and sterile gloves were used for the procedure.  Under sonographic guidance, a micropuncture needle was placed into the right common femoral vein and removed over a 018 wire which was up sized to a Bentson. A 7 French sheath was inserted. The identical procedure was performed in the same vein within additional 7 French sheath. A JB 1 catheter was advanced over a Bentson wire into the left pulmonary artery. Pressure and angiography was performed. Left pulmonary artery pressure was measured at 56/23 with a mean of 36 mm Hg. The catheter was removed over a longer Rosen wire. The identical procedure was performed into the right pulmonary artery through the other sheath. Right pulmonary artery pressure was measured at 50/23 with a mean of 31 mm Hg. Right and left 18 and 12 cm EKOS infusion catheters were then advanced into the right and left pulmonary arteries. TPA infusion was instituted.  FINDINGS: Imaging confirms right and left pulmonary artery catheter position in the descending branches. Contrast injected confirms positioning and pulmonary thromboembolism.  COMPLICATIONS: None   IMPRESSION: Successful he close bilateral pulmonary artery thrombolytic infusion.   Electronically Signed   By: Marybelle Killings M.D.   On: 09/29/2014 08:46   Ir Angiogram Selective Each Additional Vessel  09/29/2014   CLINICAL DATA:  Pulmonary thromboembolism.  Right heart strain.  EXAM: BILATERAL PULMONARY ARTERIOGRAPHY; ADDITIONAL ARTERIOGRAPHY; IR ULTRASOUND GUIDANCE VASC ACCESS RIGHT; IR INFUSION THROMBOL ARTERIAL INITIAL (MS)  FLUOROSCOPY TIME:  6 minutes 24 seconds.  MEDICATIONS AND MEDICAL HISTORY: Versed 1 mg, Fentanyl 50 mcg.  Additional Medications: None.  ANESTHESIA/SEDATION: Moderate sedation time: 30 minutes  CONTRAST:  50 cc Omnipaque 300  PROCEDURE: The procedure, risks, benefits, and alternatives were explained to the patient. Questions regarding the procedure were encouraged and answered. The patient understands and consents to the procedure.  The right groin was prepped with Betadine in a sterile fashion, and a sterile drape was applied covering the operative field. A sterile gown and sterile gloves were used for the procedure.  Under sonographic guidance, a micropuncture needle was placed into the right common femoral vein and removed over a 018 wire which was up sized to a Bentson. A 7 French sheath was inserted. The identical procedure was performed in the same vein within additional 7 French sheath. A JB 1 catheter was advanced over a Bentson wire into the left pulmonary artery. Pressure and angiography was performed. Left pulmonary artery pressure was measured at 56/23 with a mean of 36 mm Hg. The catheter was removed over a longer Rosen wire. The identical procedure was performed into the right pulmonary artery through the other sheath. Right pulmonary artery pressure was measured at 50/23 with a mean of 31 mm Hg. Right and left 18 and 12 cm EKOS infusion catheters were then advanced into the right and left pulmonary arteries. TPA infusion was instituted.  FINDINGS: Imaging confirms right and  left pulmonary artery catheter position in the descending branches. Contrast injected confirms positioning and pulmonary thromboembolism.  COMPLICATIONS: None  IMPRESSION: Successful he close  bilateral pulmonary artery thrombolytic infusion.   Electronically Signed   By: Marybelle Killings M.D.   On: 09/29/2014 08:46   Ir Ivc Filter Plmt / S&i /img Guid/mod Sed  10/02/2014   CLINICAL DATA:  Pulmonary thromboembolism. Peri-orbitall hemorrhage after anti coagulation.  EXAM: IVC FILTER,INFERIOR VENA CAVOGRAM  FLUOROSCOPY TIME:  1 minutes and 12 seconds.  MEDICATIONS AND MEDICAL HISTORY: Versed 1 mg, Fentanyl 50 mcg.  Additional Medications: None.  ANESTHESIA/SEDATION: Moderate sedation time: 15 minutes  CONTRAST:  40 cc Omnipaque 300  PROCEDURE: The procedure, risks, benefits, and alternatives were explained to the patient. Questions regarding the procedure were encouraged and answered. The patient understands and consents to the procedure.  The right neck was prepped with Betadine in a sterile fashion, and a sterile drape was applied covering the operative field. A sterile gown and sterile gloves were used for the procedure.  The Betadine vein was noted to be patent initially with ultrasound. Under sonographic guidance, a micropuncture needle was inserted into the right internal jugular vein (Ultrasound image documentation was performed). It was removed over an 018 wire which was upsized to a [place]. The sheath was inserted over the wire and into the IVC. IVC venography was performed.  The temporary filter was then deployed in the infrarenal IVC. The sheath was removed and hemostasis was achieved with direct pressure.  FINDINGS: IVC venography confirms renal vein inflow at L1. No IVC thrombus or venous anomaly.  The image demonstrates placement of an IVC filter with its tip at the L1-2 disc.  COMPLICATIONS: None  IMPRESSION: Successful infrarenal IVC filter placement. This is a temporary filter. It can be removed or  remain in place to become permanent.   Electronically Signed   By: Marybelle Killings M.D.   On: 10/02/2014 13:21   US Renal  10/11/2014   CLINICAL DATA:  Acute renal insufficiency  EXAM: RENAL/URINARY TRACT ULTRASOUND COMPLETE  COMPARISON:  CT scan 2/ 19/ 15  FINDINGS: Right Kidney:  Length: 9 mm. Echogenicity within normal limits. No mass or hydronephrosis visualized. Mild renal cortical thinning probable due to atrophy.  Left Kidney:  Length: 10.7 cm. Echogenicity within normal limits. No mass or hydronephrosis visualized. Mild renal cortical thinning probable due to atrophy.  Bladder:  Appears normal for degree of bladder distention.  IMPRESSION: 1. No hydronephrosis. No renal calculi. Bilateral mild renal cortical thinning probable due to atrophy. Unremarkable urinary bladder.   Electronically Signed   By: Lahoma Crocker M.D.   On: 10/11/2014 09:46   Ir US Guide Vasc Access Right  09/29/2014   CLINICAL DATA:  Pulmonary thromboembolism.  Right heart strain.  EXAM: BILATERAL PULMONARY ARTERIOGRAPHY; ADDITIONAL ARTERIOGRAPHY; IR ULTRASOUND GUIDANCE VASC ACCESS RIGHT; IR INFUSION THROMBOL ARTERIAL INITIAL (MS)  FLUOROSCOPY TIME:  6 minutes 24 seconds.  MEDICATIONS AND MEDICAL HISTORY: Versed 1 mg, Fentanyl 50 mcg.  Additional Medications: None.  ANESTHESIA/SEDATION: Moderate sedation time: 30 minutes  CONTRAST:  50 cc Omnipaque 300  PROCEDURE: The procedure, risks, benefits, and alternatives were explained to the patient. Questions regarding the procedure were encouraged and answered. The patient understands and consents to the procedure.  The right groin was prepped with Betadine in a sterile fashion, and a sterile drape was applied covering the operative field. A sterile gown and sterile gloves were used for the procedure.  Under sonographic guidance, a micropuncture needle was placed into the right common femoral vein and removed over a 018 wire which was up sized to a Bentson. A 7  French sheath was inserted. The  identical procedure was performed in the same vein within additional 7 French sheath. A JB 1 catheter was advanced over a Bentson wire into the left pulmonary artery. Pressure and angiography was performed. Left pulmonary artery pressure was measured at 56/23 with a mean of 36 mm Hg. The catheter was removed over a longer Rosen wire. The identical procedure was performed into the right pulmonary artery through the other sheath. Right pulmonary artery pressure was measured at 50/23 with a mean of 31 mm Hg. Right and left 18 and 12 cm EKOS infusion catheters were then advanced into the right and left pulmonary arteries. TPA infusion was instituted.  FINDINGS: Imaging confirms right and left pulmonary artery catheter position in the descending branches. Contrast injected confirms positioning and pulmonary thromboembolism.  COMPLICATIONS: None  IMPRESSION: Successful he close bilateral pulmonary artery thrombolytic infusion.   Electronically Signed   By: Marybelle Killings M.D.   On: 09/29/2014 08:46   Ir Infusion Thrombol Arterial Initial (ms)  09/29/2014   CLINICAL DATA:  Pulmonary thromboembolism.  Right heart strain.  EXAM: BILATERAL PULMONARY ARTERIOGRAPHY; ADDITIONAL ARTERIOGRAPHY; IR ULTRASOUND GUIDANCE VASC ACCESS RIGHT; IR INFUSION THROMBOL ARTERIAL INITIAL (MS)  FLUOROSCOPY TIME:  6 minutes 24 seconds.  MEDICATIONS AND MEDICAL HISTORY: Versed 1 mg, Fentanyl 50 mcg.  Additional Medications: None.  ANESTHESIA/SEDATION: Moderate sedation time: 30 minutes  CONTRAST:  50 cc Omnipaque 300  PROCEDURE: The procedure, risks, benefits, and alternatives were explained to the patient. Questions regarding the procedure were encouraged and answered. The patient understands and consents to the procedure.  The right groin was prepped with Betadine in a sterile fashion, and a sterile drape was applied covering the operative field. A sterile gown and sterile gloves were used for the procedure.  Under sonographic guidance, a  micropuncture needle was placed into the right common femoral vein and removed over a 018 wire which was up sized to a Bentson. A 7 French sheath was inserted. The identical procedure was performed in the same vein within additional 7 French sheath. A JB 1 catheter was advanced over a Bentson wire into the left pulmonary artery. Pressure and angiography was performed. Left pulmonary artery pressure was measured at 56/23 with a mean of 36 mm Hg. The catheter was removed over a longer Rosen wire. The identical procedure was performed into the right pulmonary artery through the other sheath. Right pulmonary artery pressure was measured at 50/23 with a mean of 31 mm Hg. Right and left 18 and 12 cm EKOS infusion catheters were then advanced into the right and left pulmonary arteries. TPA infusion was instituted.  FINDINGS: Imaging confirms right and left pulmonary artery catheter position in the descending branches. Contrast injected confirms positioning and pulmonary thromboembolism.  COMPLICATIONS: None  IMPRESSION: Successful he close bilateral pulmonary artery thrombolytic infusion.   Electronically Signed   By: Marybelle Killings M.D.   On: 09/29/2014 08:46   Ir Jacolyn Reedy F/u Eval Art/ven Final Day (ms)  09/29/2014   CLINICAL DATA:  Follow-up EKOS pulmonary artery thrombolytic therapy.  EXAM: IR THROMB F/U EVAL ART/VEN FINAL DAY  FLUOROSCOPY TIME:  20 seconds.  MEDICATIONS AND MEDICAL HISTORY: None  ANESTHESIA/SEDATION: None  CONTRAST:  None  PROCEDURE: The procedure, risks, benefits, and alternatives were explained to the patient. Questions regarding the procedure were encouraged and answered. The patient understands and consents to the procedure.  Right and left pulmonary artery pressures were obtained. Right pulmonary artery pressure is 35/27 with a  mean of 31 mm Hg. That of the left is 34/29 with a mean of 31 mm Hg. The catheters and sheaths were removed. Hemostasis was achieved with direct pressure and VPAD.   FINDINGS: Fluoroscopic imaging confirms stable position of the right and left pulmonary artery EKOS catheters.  COMPLICATIONS: None  IMPRESSION: Successful conclusion of bilateral pulmonary artery EKOS thrombo lytic therapy. Peak systolic pressures have significantly improved from 56 to 35 mm Hg in the right pulmonary artery and 50 to 34 mm Hg in the left pulmonary artery.   Electronically Signed   By: Marybelle Killings M.D.   On: 09/29/2014 09:21    Labs:  CBC:  Recent Labs  10/10/14 2052 10/11/14 1055 10/15/14 2231 10/16/14 0840  WBC 11.3* 8.2 12.4* 10.5  HGB 11.5* 10.2* 9.3* 9.1*  HCT 38.5 33.8* 30.5* 29.3*  PLT 257 199 237 PENDING    COAGS:  Recent Labs  09/27/14 1457 10/02/14 0422 10/10/14 2052 10/11/14 1055  INR 1.08 1.13 1.09  --   APTT 28 29  --  33    BMP:  Recent Labs  10/11/14 1055 10/12/14 0530 10/15/14 2231 10/16/14 0840  NA 140 138 130* 135  K 3.7 4.4 4.2 3.8  CL 108 105 97 100  CO2 22 23 22 24   GLUCOSE 125* 121* 146* 119*  BUN 18 15 11 10   CALCIUM 8.9 9.5 8.5 8.7  CREATININE 1.10 0.88 0.79 0.72  GFRNONAA 49* 64* 81* 84*  GFRAA 57* 74* >90 >90    LIVER FUNCTION TESTS:  Recent Labs  05/08/14 1417 09/27/14 1046 10/10/14 2052 10/11/14 1055  BILITOT 0.44 0.5 0.6 0.6  AST 18 34 30 23  ALT 15 23 19 15   ALKPHOS 57 61 68 55  PROT 6.6 5.6* 6.8 5.5*  ALBUMIN 3.7 3.0* 3.8 3.1*    TUMOR MARKERS: No results for input(s): AFPTM, CEA, CA199, CHROMGRNA in the last 8760 hours.  Assessment and Plan:  Pt with Hx breast ca Hx B PE --thrombolysis 09/28/14 DC'd xarelto secondary periorbital hemorrhage IVC filter placed 10/02/14 Re admitted 4/21 -- hypotensive and syncope Noted B leg swelling and pain New Doppler 4/25 reveals extensive Rt DVT and L saphenous V DVT Now scheduled for possible BLE venous thrombolysis in IR Dr Chase Caller has asked Dr Beryle Beams to evaluate pt before we move forward  Pt is ware of procedure Risks and Benefits discussed  with the patient including, but not limited to bleeding, possible life threatening bleeding and need for blood product transfusion, vascular injury, stroke and infection. All of the patient's questions were answered, patient is agreeable to proceed. Consent signed and in chart.  We will check chart for hematology consult  Thank you for this interesting consult.  I greatly enjoyed meeting Marieke Lubke and look forward to participating in their care.  Signed: Jahzara Slattery A 10/16/2014, 11:09 AM   I spent a total of 40 Minutes  in face to face in clinical consultation, greater than 50% of which was counseling/coordinating care for possible BLE venous lysis   Addendum: Pharmacist reports NO contraindication to repeat use of tpa for lysis as per all her sources.  Would suggest waiting for Hematology recommendation also.

## 2014-10-16 NOTE — Progress Notes (Signed)
PT Cancellation Note  Patient Details Name: [name] MRN: [id number] DOB: 1940/06/26   Cancelled Treatment:    Reason Eval/Treat Not Completed: Medical issues which prohibited therapy New sites located with DVTs since previous therapy session. Patient's therapeutic range for Heparin (0.3-0.7 units/ml) is currently out of range per most recent lab report. Will hold therapy at this time per departmental protocol. Of note, patient to have surgery tomorrow 4/28 for possible thrombectomy in IR.  Ellouise Newer 10/16/2014, 4:40 PM Camille Bal [place], [place]

## 2014-10-16 NOTE — Progress Notes (Signed)
ANTICOAGULATION CONSULT NOTE - Follow Up Consult  Pharmacy Consult for Heparin  Indication: pulmonary embolus and DVT  No Known Allergies  Patient Measurements: Height: 5\' 5"  (165.1 cm) Weight: 199 lb 4.7 oz (90.4 kg) IBW/kg (Calculated) : 57 Vital Signs: Temp: 98.9 F (37.2 C) (04/27 1445) Temp Source: Oral (04/27 1445) BP: 149/68 mmHg (04/27 1445) Pulse Rate: 105 (04/27 1445)  Labs:  Recent Labs  10/15/14 2231 10/16/14 0840 10/16/14 1942  HGB 9.3* 9.1*  --   HCT 30.5* 29.3*  --   PLT 237 209  --   HEPARINUNFRC 0.33 1.00* <0.10*  CREATININE 0.79 0.72  --     Estimated Creatinine Clearance: 70.6 mL/min (by C-G formula based on Cr of 0.72).   Assessment: 89 YOF with h/o of bilateral PE and new bilateral distal DVTs on IV heparin.  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 IR to perform local lysis tomorrow. Patient has an IVC filter in place which will remain in place for a few more months to avoid dislodging a clot.  PM HL < 0.10  Goal of Therapy:  Heparin level 0.3-0.7 units/ml Monitor platelets by anticoagulation protocol: Yes   Plan:  Increase heparin to 1100 units / hr Follow up Am labs  Thank you. Anette Guarneri, PharmD [phone]
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
## 2014-10-17 ENCOUNTER — Ambulatory Visit: Payer: Medicare Other | Admitting: Pulmonary Disease

## 2014-10-17 ENCOUNTER — Inpatient Hospital Stay (HOSPITAL_COMMUNITY): Payer: Medicare Other

## 2014-10-17 LAB — CBC WITH DIFFERENTIAL/PLATELET
Basophils Absolute: 0 10*3/uL (ref 0.0–0.1)
Basophils Relative: 0 % (ref 0–1)
Eosinophils Absolute: 0.2 10*3/uL (ref 0.0–0.7)
Eosinophils Relative: 2 % (ref 0–5)
HEMATOCRIT: 30.4 % — AB (ref 36.0–46.0)
Hemoglobin: 9.3 g/dL — ABNORMAL LOW (ref 12.0–15.0)
LYMPHS PCT: 16 % (ref 12–46)
Lymphs Abs: 1.5 10*3/uL (ref 0.7–4.0)
MCH: 25.2 pg — ABNORMAL LOW (ref 26.0–34.0)
MCHC: 30.6 g/dL (ref 30.0–36.0)
MCV: 82.4 fL (ref 78.0–100.0)
MONO ABS: 1 10*3/uL (ref 0.1–1.0)
Monocytes Relative: 11 % (ref 3–12)
Neutro Abs: 6.8 10*3/uL (ref 1.7–7.7)
Neutrophils Relative %: 71 % (ref 43–77)
Platelets: 258 10*3/uL (ref 150–400)
RBC: 3.69 MIL/uL — ABNORMAL LOW (ref 3.87–5.11)
RDW: 21.8 % — ABNORMAL HIGH (ref 11.5–15.5)
WBC: 9.5 10*3/uL (ref 4.0–10.5)

## 2014-10-17 LAB — FIBRINOGEN: Fibrinogen: 403 mg/dL (ref 204–475)

## 2014-10-17 LAB — COMPREHENSIVE METABOLIC PANEL
ALBUMIN: 2.4 g/dL — AB (ref 3.5–5.2)
ALT: 47 U/L — ABNORMAL HIGH (ref 0–35)
AST: 42 U/L — AB (ref 0–37)
Alkaline Phosphatase: 97 U/L (ref 39–117)
Anion gap: 10 (ref 5–15)
BILIRUBIN TOTAL: 0.7 mg/dL (ref 0.3–1.2)
BUN: 9 mg/dL (ref 6–23)
CO2: 27 mmol/L (ref 19–32)
Calcium: 8.7 mg/dL (ref 8.4–10.5)
Chloride: 100 mmol/L (ref 96–112)
Creatinine, Ser: 0.63 mg/dL (ref 0.50–1.10)
GFR calc Af Amer: >90 mL/min (ref >90)
GFR calc non Af Amer: 87 mL/min — ABNORMAL LOW (ref >90)
Glucose, Bld: 115 mg/dL — ABNORMAL HIGH (ref 70–99)
POTASSIUM: 4 mmol/L (ref 3.5–5.1)
Sodium: 137 mmol/L (ref 135–145)
Total Protein: 5.4 g/dL — ABNORMAL LOW (ref 6.0–8.3)

## 2014-10-17 LAB — CBC
HCT: 30.3 % — ABNORMAL LOW (ref 36.0–46.0)
HEMOGLOBIN: 9.1 g/dL — AB (ref 12.0–15.0)
MCH: 24.7 pg — ABNORMAL LOW (ref 26.0–34.0)
MCHC: 30 g/dL (ref 30.0–36.0)
MCV: 82.3 fL (ref 78.0–100.0)
PLATELETS: 234 10*3/uL (ref 150–400)
RBC: 3.68 MIL/uL — AB (ref 3.87–5.11)
RDW: 21.5 % — ABNORMAL HIGH (ref 11.5–15.5)
WBC: 12.2 10*3/uL — AB (ref 4.0–10.5)

## 2014-10-17 LAB — HEPARIN LEVEL (UNFRACTIONATED)
Heparin Unfractionated: 0.2 IU/mL — ABNORMAL LOW (ref 0.30–0.70)
Heparin Unfractionated: 0.32 IU/mL (ref 0.30–0.70)
Heparin Unfractionated: 0.1 IU/mL — ABNORMAL LOW (ref 0.30–0.70)

## 2014-10-17 LAB — APTT: aPTT: 37 seconds (ref 24–37)

## 2014-10-17 LAB — PROTIME-INR
INR: 1.05 (ref 0.00–1.49)
Prothrombin Time: 13.9 seconds (ref 11.6–15.2)

## 2014-10-17 MED ORDER — FENTANYL CITRATE (PF) 100 MCG/2ML IJ SOLN
INTRAMUSCULAR | Status: AC | PRN
Start: 2014-10-17 — End: 2014-10-17
  Administered 2014-10-17 (×3): 25 ug via INTRAVENOUS

## 2014-10-17 MED ORDER — MIDAZOLAM HCL 2 MG/2ML IJ SOLN: 1.0000 mg | INTRAMUSCULAR | Status: DC | PRN

## 2014-10-17 MED ORDER — SODIUM CHLORIDE 0.9 % IV SOLN
0.2500 mg/h | INTRAVENOUS | Status: DC
  Administered 2014-10-18 (×2): 0.25 mg/h via INTRAVENOUS
  Filled 2014-10-17 (×3): qty 0.5

## 2014-10-17 MED ORDER — ONDANSETRON HCL 4 MG/2ML IJ SOLN: 4.0000 mg | Freq: Four times a day (QID) | INTRAMUSCULAR | Status: DC | PRN

## 2014-10-17 MED ORDER — MIDAZOLAM HCL 2 MG/2ML IJ SOLN
INTRAMUSCULAR | Status: AC
  Filled 2014-10-17: qty 4

## 2014-10-17 MED ORDER — HYDROCORTISONE 1 % EX CREA
TOPICAL_CREAM | Freq: Two times a day (BID) | CUTANEOUS | Status: DC
  Administered 2014-10-17 – 2014-10-19 (×3): via TOPICAL
  Administered 2014-10-20: 1 via TOPICAL
  Administered 2014-10-20 – 2014-10-22 (×5): via TOPICAL
  Administered 2014-10-23: 1 via TOPICAL
  Administered 2014-10-23 – 2014-10-26 (×6): via TOPICAL
  Filled 2014-10-17 (×5): qty 28

## 2014-10-17 MED ORDER — MIDAZOLAM HCL 2 MG/2ML IJ SOLN
INTRAMUSCULAR | Status: AC | PRN
  Administered 2014-10-17 (×2): 1 mg via INTRAVENOUS

## 2014-10-17 MED ORDER — MORPHINE SULFATE 4 MG/ML IJ SOLN
5.0000 mg | INTRAMUSCULAR | Status: DC | PRN
Start: 2014-10-17 — End: 2014-10-18

## 2014-10-17 MED ORDER — SODIUM CHLORIDE 0.9 % IV SOLN: 250.0000 mL | INTRAVENOUS | Status: DC | PRN

## 2014-10-17 MED ORDER — TENECTEPLASE 50 MG IV KIT
0.2500 mg/h | PACK | INTRAVENOUS | Status: DC
  Administered 2014-10-18 (×2): 0.25 mg/h via INTRAVENOUS
  Filled 2014-10-17 (×3): qty 0.5

## 2014-10-17 MED ORDER — SODIUM CHLORIDE 0.9 % IJ SOLN
3.0000 mL | Freq: Two times a day (BID) | INTRAMUSCULAR | Status: DC
  Administered 2014-10-20: 3 mL via INTRAVENOUS

## 2014-10-17 MED ORDER — FENTANYL CITRATE (PF) 100 MCG/2ML IJ SOLN
INTRAMUSCULAR | Status: AC
  Filled 2014-10-17: qty 4

## 2014-10-17 MED ORDER — HYDROCORTISONE 1 % EX LOTN: TOPICAL_LOTION | Freq: Two times a day (BID) | CUTANEOUS | Status: DC

## 2014-10-17 MED ORDER — SODIUM CHLORIDE 0.9 % IJ SOLN
3.0000 mL | INTRAMUSCULAR | Status: DC | PRN
  Administered 2014-10-18: 3 mL via INTRAVENOUS
  Filled 2014-10-17: qty 3

## 2014-10-17 MED ORDER — IOHEXOL 300 MG/ML  SOLN
100.0000 mL | Freq: Once | INTRAMUSCULAR | Status: AC | PRN
  Administered 2014-10-17: 25 mL via INTRAVENOUS

## 2014-10-17 MED ORDER — LIDOCAINE HCL (PF) 1 % IJ SOLN
INTRAMUSCULAR | Status: AC
  Filled 2014-10-17: qty 30

## 2014-10-17 MED ORDER — ACETAMINOPHEN 325 MG PO TABS
650.0000 mg | ORAL_TABLET | Freq: Four times a day (QID) | ORAL | Status: DC | PRN
  Administered 2014-10-17 – 2014-10-26 (×10): 650 mg via ORAL
  Filled 2014-10-17 (×10): qty 2

## 2014-10-17 NOTE — Progress Notes (Signed)
XARELTO 15 MG BID   AND XARELTO 20MG  BID (  BOTH SAME ) COVER- YES CO-PAY- $ 218.55  60 PILLS FOR 30 DAYS SUPPLY TIER- 2 DRUG PRIOR APPROVAL - NO PHARMACY: [facility] , [facility] AND [facility]  XARELTO 15 MG  QD  AND XARELTO 20 MG QD  ( SAME FOR BOTH ) COVER- YES CO-PAY- 125.94    30 DAY SUPPLY TIER- 2 DRUG PRIOR APPROVAL - NO PHARMACY: SAME AS ABOVE  ELIQUIS 5 MG BID 30 DAY SUPPLY COVER- YES CO-PAY-$ 126.02 TIER- 2 DRUG PRIOR APPROVA-L NO PHARMACY : SAME AS ABOVE

## 2014-10-17 NOTE — Sedation Documentation (Signed)
Patient is resting comfortably. 

## 2014-10-17 NOTE — Progress Notes (Signed)
[facility]                                                                              Patient Demographics  [name], is a 74 y.o. female, DOB - 1940-08-26, WVP:[id number]  Admit date - 10/10/2014   Admitting Physician Ivor Costa, MD  Outpatient Primary MD for the patient is Osborne Casco, MD  LOS - 7   Chief Complaint  Patient presents with  . Loss of Consciousness       Brief HPI   Patient is a 74 year old female with hypertension, hyperlipidemia, GERD, right breast cancer (post status of surgery and radiation therapy), history of pulmonary embolism (with R heart straining by 2D echo on 09/27/14), history of right lower leg DVT, hx of retinal bleeding, recent IVC filter placement on 10/02/14, was entered from home with a syncopal episode. Patient reported that about about 3 PM, she started feeling clammy, sweating and cold, dizzy and lightheaded. She felt that she was going to pass out, but did not. She was doing okay until 6 PM, when she vomited twice, denied any hematemesis and then subsequently had a syncopal episode. She strongly denied having seizure and unilateral weakness. No chest pain. She has mild shortness of breath due to pulmonary embolism, which is at her baseline. She still has mild swelling in the R lower leg due to DVT.  In ED, patient was found to have AKI with Cr 1.73, negative troponin, BNP 66, normal temperature, mild tachycardia, WBC 11.3, INR1.09. CT head is negative for acute abnormalities. EKG showed tachycardia and low voltage, no ischemic change. Patient was admitted to inpatient for further evaluation and treatment  4/23: Patient complaining of dizziness, orthostatic vitals positive  Assessment & Plan    Recurrent Syncope: Possibly due to low flow state from a right heart strain with bilateral pulmonary embolism, ? IVC obstruction, acute bilateral DVT. Patient had recent bilateral PE and had received thrombolysis via EKOS on  4/9. Patient however did not tolerate oral anticoagulation and had developed periorbital ecchymosis. - CT head negative for acute abnormalities, no focal neurological deficits - 2-D echo showed EF of 54-62%, grade 1 diastolic dysfunction, no right heart failure - PTOT evaluation-> no PT follow-up needed - cortisol, vitamin B12, folate level all within normal limits - MRI of the brain negative for any posterior circulation ischemia - Cardiology was consulted, multiple imagings done. Duplex of the lower extremities showed acute DVT bilateral lower extremities. CT angiogram of the chest showed near resolution of pulmonary embolism. However CT abdomen showed a thrombosis distal to IVC. - Seen by Dr. Chase Caller: recommended starting patient on IV heparin drip 3-5 days as inpatient, if remains stable with no bleeding, then start NOAC (eliquis) prior to the discharge. Also do not remove IVC filter now as patient has clots distal to IVC which can dislodge and cause further pulmonary embolism. -IR to see about possible thrombolysis   recent Pulmonary embolism: Likely from tamoxifen - please refer to #1   Acute kidney injury:  Most likely due to dehydration secondary to vomiting-> resolved  - Creatinine 1.73 at the time of admission, renal ultrasound  negative for acute obstruction or hydronephrosis. - Lisinopril discontinued.    GERD: -Continue Pepcid  Hyperlipidemia: - Patient not on any statins  Nausea and vomiting: Etiology is not clear, resolved - Lipase is 82 however patient does not have any symptoms of acute pancreatitis. She has  no abdominal pain, tolerating regular diet   Right breast cancer:  status post XRT and surgery. Patient used to take tamoxifen, which was discontinued because of DVT and pulmonary embolism. -follow up with Dr. Lindi Adie  Code Status: Full code   Family Communication: patient and husband at the bedside   Disposition Plan:    Time Spent in minutes  25  minutes  Procedures  CT head MRI of the brain 2-D echocardiogram  Consults   Hematology Pulmonology IR  DVT Prophylaxis  heparin subcutaneous  Medications  Scheduled Meds: . acidophilus  1 capsule Oral Daily  . calcium carbonate  1,500 mg of elemental calcium Oral Q breakfast  . cholecalciferol  5,000 Units Oral Daily  . famotidine  20 mg Oral Daily  . iron polysaccharides  150 mg Oral Daily  . multivitamin with minerals  1 tablet Oral Daily  . omega-3 acid ethyl esters  1 g Oral Daily  . sodium chloride  3 mL Intravenous Q12H  . vitamin C  250 mg Oral Daily  . vitamin E  100 Units Oral Daily   Continuous Infusions: . heparin 1,200 Units/hr (10/17/14 0502)   PRN Meds:.alum & mag hydroxide-simeth, morphine injection, ondansetron (ZOFRAN) IV, ondansetron **OR** ondansetron (ZOFRAN) IV, sodium chloride   Antibiotics   Anti-infectives    None        Subjective:   [name] is able to move her legs better   Objective:   Blood pressure 139/61, pulse 88, temperature 98.6 F (37 C), temperature source Oral, resp. rate 16, height 5\' 5"  (1.651 m), weight 90.4 kg (199 lb 4.7 oz), SpO2 95 %.  Wt Readings from Last 3 Encounters:  10/11/14 90.4 kg (199 lb 4.7 oz)  10/02/14 95.6 kg (210 lb 12.2 oz)  05/08/14 93.849 kg (206 lb 14.4 oz)     Intake/Output Summary (Last 24 hours) at 10/17/14 4403 Last data filed at 10/16/14 2200  Gross per 24 hour  Intake    360 ml  Output    851 ml  Net   -491 ml    Exam  General: Alert and oriented x 3, NAD  CVS: S1 S2 clear, no MRG  Respiratory:  CTA B  Abdomen: Soft, NT, ND, NBS  Ext: b/l swelling, ted hose place   Data Review   Micro Results Recent Results (from the past 240 hour(s))  Urine culture     Status: None   Collection Time: 10/11/14  8:34 AM  Result Value Ref Range Status   Specimen Description URINE, CLEAN CATCH  Final   Special Requests NONE  Final   Colony Count   Final    >=100,000  COLONIES/ML Performed at [facility]    Culture   Final    Multiple bacterial morphotypes present, none predominant. Suggest appropriate recollection if clinically indicated. Performed at [facility]    Report Status 10/13/2014 FINAL  Final    Radiology Reports Dg Chest 2 View  09/27/2014   CLINICAL DATA:  Shortness of breath for a few days.  EXAM: CHEST  2 VIEW  COMPARISON:  PA and lateral chest 07/26/2013.  FINDINGS: The lungs are clear. Heart size is normal. No pneumothorax or  pleural effusion. Small hiatal hernia is noted. Calcified breast implants seen on the prior study are no longer visualized.  IMPRESSION: No acute disease.  Small hiatal hernia.   Electronically Signed   By: Inge Rise M.D.   On: 09/27/2014 11:10   Ct Head Wo Contrast  10/10/2014   CLINICAL DATA:  Admitted for multiple blood clots. Syncopal episode today at home.  EXAM: CT HEAD WITHOUT CONTRAST  TECHNIQUE: Contiguous axial images were obtained from the base of the skull through the vertex without intravenous contrast.  COMPARISON:  09/28/2014; 12/27/2007  FINDINGS: The gray-white differentiation is maintained. No CT evidence of acute large territory infarct. No intraparenchymal or extra-axial mass or hemorrhage. Unchanged size and configuration of the ventricles and basilar cisterns. No midline shift. There is scattered polypoid mucosal thickening of the right posterior ethmoidal air cells as well as the left sphenoid sinus. The remaining paranasal sinuses and mastoid air cells appear normally aerated. No air-fluid levels. Old right-sided nasal bone fracture (representative image 14, series 3). No acutely displaced calvarial fracture. Regional soft tissues appear normal.  IMPRESSION: Negative noncontrast head CT.   Electronically Signed   By: Sandi Mariscal M.D.   On: 10/10/2014 22:50   Ct Head Wo Contrast  09/28/2014   CLINICAL DATA:  Acute pulmonary embolism. No history of breast cancer.  EXAM: CT HEAD  WITHOUT CONTRAST  TECHNIQUE: Contiguous axial images were obtained from the base of the skull through the vertex without intravenous contrast.  COMPARISON:  Head CT 12/27/2007  FINDINGS: All No acute intracranial hemorrhage. No focal mass lesion. No CT evidence of acute infarction. No midline shift or mass effect. No hydrocephalus. Basilar cisterns are patent. Paranasal sinuses and mastoid air cells are clear. Mild periventricular white matter hypodensities.  IMPRESSION: No acute intracranial findings.  Mild white matter microvascular change.   Electronically Signed   By: Suzy Bouchard M.D.   On: 09/28/2014 11:09   Ct Angio Chest Pe W/cm &/or Wo Cm  10/14/2014   CLINICAL DATA:  Recent right breast cancer surgery with 3 positive lymph nodes. History of pulmonary embolism, DVT and IVC filter placement.  EXAM: CT ANGIOGRAPHY CHEST  CT ABDOMEN AND PELVIS WITH CONTRAST  TECHNIQUE: Multidetector CT imaging of the chest was performed using the standard protocol during bolus administration of intravenous contrast. Multiplanar CT image reconstructions and MIPs were obtained to evaluate the vascular anatomy. Multidetector CT imaging of the abdomen and pelvis was performed using the standard protocol during bolus administration of intravenous contrast.  CONTRAST:  78mL OMNIPAQUE IOHEXOL 350 MG/ML SOLN  COMPARISON:  Mid chest CTA 09/27/2014. Abdominal pelvic CT 08/09/2013.  FINDINGS: CTA CHEST FINDINGS  Mediastinum: The pulmonary arteries are well opacified with contrast. There has been interval significant lysis of the previously demonstrated acute pulmonary embolism bilaterally. There is a small amount of residual nonocclusive thromboembolic disease within the segmental and subsegmental branches of the pulmonary arteries. The RV to LV ratio has nearly normalized (1.06). Left arm PICC the tip is in the lower SVC.Mild atherosclerosis appears stable. There are no enlarged mediastinal, hilar or axillary lymph nodes. There  is a moderate size hiatal hernia.  Lungs/Pleura: There is no pleural effusion.There is mildly increased dependent atelectasis at both lung bases. No confluent airspace opacity or suspicious nodule.  Musculoskeletal/Chest wall: Bilateral breast implants/tissue expanders noted. No evidence of chest wall mass or suspicious osseous finding.  CT ABDOMEN and PELVIS FINDINGS  Hepatobiliary: Portal phase images through the abdomen are motion degraded. The  liver demonstrates mild steatosis, but no focal lesion. No evidence of gallstones, gallbladder wall thickening or biliary dilatation.  Pancreas: Unremarkable. No pancreatic ductal dilatation or surrounding inflammatory changes.  Spleen: Normal in size without focal abnormality.  Adrenals/Urinary Tract: Both adrenal glands appear normal.There is a stable small low-density lesion posteriorly in the mid left kidney, best seen on image 11 of series 601. No evidence of hydronephrosis. The bladder appears normal.  Stomach/Bowel: No evidence of bowel wall thickening, distention or surrounding inflammatory change.Mild sigmoid diverticulosis.  Vascular/Lymphatic: There are no enlarged abdominal or pelvic lymph nodes. Mild aortoiliac atherosclerosis. Interval IVC filter placement inferior to the renal veins. There is limited contrast opacification of the infrarenal IVC and pelvic veins.  Reproductive: Probable uterine fibroid as before.  No adnexal mass.  Other: Interval development of subcutaneous edema in both flanks and proximal thighs. There is also mild mesenteric edema with a small amount of pelvic ascites.  Musculoskeletal: No acute or significant osseous findings.  Review of the MIP images confirms the above findings.  IMPRESSION: 1. Interval successful near-complete lysis of previously demonstrated bilateral pulmonary emboli. Signs of right heart strain have nearly completely resolved. 2. IVC filter placement with normal opacification of the suprarenal IVC. The infrarenal  IVC and pelvic veins are suboptimally opacified. 3. Anasarca with generalized subcutaneous edema in the flanks and thighs and minimal ascites. 4. No evidence of metastatic breast cancer.   Electronically Signed   By: Richardean Sale M.D.   On: 10/14/2014 14:10   Ct Angio Chest Pe W/cm &/or Wo Cm  09/27/2014   CLINICAL DATA:  73YOF today pt was on the toilet and passed out. Small hematoma to head. [facility] reported that pt was orthostatic and there was a loss of radial pulse while standing. History of right breast cancer.  EXAM: CT ANGIOGRAPHY CHEST WITH CONTRAST  TECHNIQUE: Multidetector CT imaging of the chest was performed using the standard protocol during bolus administration of intravenous contrast. Multiplanar CT image reconstructions and MIPs were obtained to evaluate the vascular anatomy.  CONTRAST:  79mL OMNIPAQUE IOHEXOL 350 MG/ML SOLN  COMPARISON:  CT abdomen 08/09/2013 and chest x-ray today.  FINDINGS: Lungs are clear.  Airways are normal.  Heart is normal in size. Pulmonary arterial system is well opacified and demonstrates moderate burden of emboli over the origin of the upper and lower lobar pulmonary arteries bilaterally as well as the proximal lingular and right middle lobe pulmonary arteries. There is evidence of right heart strain with elevated RV/LV ratio of 50.5 mm/27.2 mm = 1.9 (normal less than 0.9).  There is a moderate size hiatal hernia. Remaining mediastinal structures are unremarkable. Bilateral breast implants present.  Images through the upper abdomen demonstrate a 3 mm calcification over the upper pole left kidney. Mild degenerative change of the spine. No acute fractures.  Review of the MIP images confirms the above findings.  IMPRESSION: Moderate burden of bilateral pulmonary emboli. Positive for acute PE with CT evidence of right heart strain (RV/LV Ratio = 1.9) consistent with at least submassive (intermediate risk) PE. The presence of right heart strain has been associated with an  increased risk of morbidity and mortality. Please activate Code PE by paging [phone].  Moderate size hiatal hernia.  3 mm calcification over the upper pole left renal cortex.  Critical Value/emergent results were called by telephone at the time of interpretation on 09/27/2014 at 1:33 pm to Dr. Blanchie Dessert , who verbally acknowledged these results.   Electronically Signed  By: Marin Olp M.D.   On: 09/27/2014 13:34   Mr Brain Wo Contrast  10/12/2014   CLINICAL DATA:  Episode of feeling clammy, diaphoretic, cold, dizzy, and lightheaded beginning 4 hours ago. History of right lower extremity DVT and pulmonary embolism recently. IVC filter placement. Syncopal episode. Ortho static dizziness.  EXAM: MRI HEAD WITHOUT CONTRAST  TECHNIQUE: Multiplanar, multiecho pulse sequences of the brain and surrounding structures were obtained without intravenous contrast.  COMPARISON:  CT head without contrast 10/10/2014  FINDINGS: No acute infarct, hemorrhage, or mass lesion is present. Mild atrophy is present. Moderate periventricular and scattered subcortical T2 changes are noted bilaterally. The ventricles are proportionate to the degree of atrophy. Mild white matter changes extend into the brainstem.  The globes and orbits are intact. Mild mucosal thickening is present within the ethmoid air cells bilaterally. There are no fluid levels. The remaining paranasal sinuses are clear. There is fluid in the left mastoid air cells. No obstructing nasopharyngeal lesion is present.  IMPRESSION: 1. No acute intracranial abnormality. 2. Moderate periventricular and scattered subcortical T2 changes bilaterally. These are nonspecific, but likely reflects sequela of chronic microvascular ischemia. 3. Left mastoid effusion. No obstructing nasopharyngeal lesion is present.   Electronically Signed   By: San Morelle M.D.   On: 10/12/2014 19:14   Ct Abdomen Pelvis W Contrast  10/15/2014   CLINICAL DATA:  Evaluate IVC filter   EXAM: CT ABDOMEN AND PELVIS WITH CONTRAST  TECHNIQUE: Multidetector CT imaging of the abdomen and pelvis was performed using the standard protocol following bolus administration of intravenous contrast.  CONTRAST:  161mL OMNIPAQUE IOHEXOL 350 MG/ML SOLN  COMPARISON:  10/14/2014  FINDINGS: An IVC filter is stable in position. There is low-density within the filter as well as extending above the filter 12 mm. Bilateral iliac veins are somewhat distended and with heterogeneous density. These findings are worrisome for thrombus within the filter and extending just above the filter. Iliac veins are probably Ing cord sheet and dilated secondary to venous hypertension. Thrombus in the iliac venous system is not excluded.  Mild bibasilar atelectasis.  Hiatal hernia.  Diffuse hepatic steatosis.  Spleen, adrenal gland are within normal limits. Stable appearance of the kidneys.  There is a 21 x 11 mm low-density lesion at the junction of the body and head of the pancreas. This was poorly visualized yesterday secondary to motion artifact.  Stranding and edema within the subcutaneous fat throughout the abdomen and pelvis is stable.  Uterus remains heterogeneous  Bladder is decompressed.  Sigmoid diverticulosis without diverticulitis.  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  IMPRESSION: Findings are worrisome for thrombus within the IVC filter and extending just above the filter. Thrombus in the iliac venous system cannot be excluded. The patient does have bilateral lower extremity DVT diagnosed by Doppler ultrasound. Dedicated venogram can be performed to further delineate.  There is a lesion within the junction of the body and head of the pancreas as described. It is low-density. Malignancy is not excluded. MRI of the pancreas is recommended to further delineate.  Other findings are stable compared with yesterday.   Electronically Signed   By: Marybelle Killings M.D.   On: 10/15/2014 11:07   Ct Abdomen Pelvis W  Contrast  10/14/2014   CLINICAL DATA:  Recent right breast cancer surgery with 3 positive lymph nodes. History of pulmonary embolism, DVT and IVC filter placement.  EXAM: CT ANGIOGRAPHY CHEST  CT ABDOMEN AND PELVIS WITH CONTRAST  TECHNIQUE: Multidetector  CT imaging of the chest was performed using the standard protocol during bolus administration of intravenous contrast. Multiplanar CT image reconstructions and MIPs were obtained to evaluate the vascular anatomy. Multidetector CT imaging of the abdomen and pelvis was performed using the standard protocol during bolus administration of intravenous contrast.  CONTRAST:  76mL OMNIPAQUE IOHEXOL 350 MG/ML SOLN  COMPARISON:  Mid chest CTA 09/27/2014. Abdominal pelvic CT 08/09/2013.  FINDINGS: CTA CHEST FINDINGS  Mediastinum: The pulmonary arteries are well opacified with contrast. There has been interval significant lysis of the previously demonstrated acute pulmonary embolism bilaterally. There is a small amount of residual nonocclusive thromboembolic disease within the segmental and subsegmental branches of the pulmonary arteries. The RV to LV ratio has nearly normalized (1.06). Left arm PICC the tip is in the lower SVC.Mild atherosclerosis appears stable. There are no enlarged mediastinal, hilar or axillary lymph nodes. There is a moderate size hiatal hernia.  Lungs/Pleura: There is no pleural effusion.There is mildly increased dependent atelectasis at both lung bases. No confluent airspace opacity or suspicious nodule.  Musculoskeletal/Chest wall: Bilateral breast implants/tissue expanders noted. No evidence of chest wall mass or suspicious osseous finding.  CT ABDOMEN and PELVIS FINDINGS  Hepatobiliary: Portal phase images through the abdomen are motion degraded. The liver demonstrates mild steatosis, but no focal lesion. No evidence of gallstones, gallbladder wall thickening or biliary dilatation.  Pancreas: Unremarkable. No pancreatic ductal dilatation or  surrounding inflammatory changes.  Spleen: Normal in size without focal abnormality.  Adrenals/Urinary Tract: Both adrenal glands appear normal.There is a stable small low-density lesion posteriorly in the mid left kidney, best seen on image 11 of series 601. No evidence of hydronephrosis. The bladder appears normal.  Stomach/Bowel: No evidence of bowel wall thickening, distention or surrounding inflammatory change.Mild sigmoid diverticulosis.  Vascular/Lymphatic: There are no enlarged abdominal or pelvic lymph nodes. Mild aortoiliac atherosclerosis. Interval IVC filter placement inferior to the renal veins. There is limited contrast opacification of the infrarenal IVC and pelvic veins.  Reproductive: Probable uterine fibroid as before.  No adnexal mass.  Other: Interval development of subcutaneous edema in both flanks and proximal thighs. There is also mild mesenteric edema with a small amount of pelvic ascites.  Musculoskeletal: No acute or significant osseous findings.  Review of the MIP images confirms the above findings.  IMPRESSION: 1. Interval successful near-complete lysis of previously demonstrated bilateral pulmonary emboli. Signs of right heart strain have nearly completely resolved. 2. IVC filter placement with normal opacification of the suprarenal IVC. The infrarenal IVC and pelvic veins are suboptimally opacified. 3. Anasarca with generalized subcutaneous edema in the flanks and thighs and minimal ascites. 4. No evidence of metastatic breast cancer.   Electronically Signed   By: Richardean Sale M.D.   On: 10/14/2014 14:10   Ir Angiogram Pulmonary Bilateral Selective  09/29/2014   CLINICAL DATA:  Pulmonary thromboembolism.  Right heart strain.  EXAM: BILATERAL PULMONARY ARTERIOGRAPHY; ADDITIONAL ARTERIOGRAPHY; IR ULTRASOUND GUIDANCE VASC ACCESS RIGHT; IR INFUSION THROMBOL ARTERIAL INITIAL (MS)  FLUOROSCOPY TIME:  6 minutes 24 seconds.  MEDICATIONS AND MEDICAL HISTORY: Versed 1 mg, Fentanyl 50 mcg.   Additional Medications: None.  ANESTHESIA/SEDATION: Moderate sedation time: 30 minutes  CONTRAST:  50 cc Omnipaque 300  PROCEDURE: The procedure, risks, benefits, and alternatives were explained to the patient. Questions regarding the procedure were encouraged and answered. The patient understands and consents to the procedure.  The right groin was prepped with Betadine in a sterile fashion, and a sterile drape was applied covering  the operative field. A sterile gown and sterile gloves were used for the procedure.  Under sonographic guidance, a micropuncture needle was placed into the right common femoral vein and removed over a 018 wire which was up sized to a Bentson. A 7 French sheath was inserted. The identical procedure was performed in the same vein within additional 7 French sheath. A JB 1 catheter was advanced over a Bentson wire into the left pulmonary artery. Pressure and angiography was performed. Left pulmonary artery pressure was measured at 56/23 with a mean of 36 mm Hg. The catheter was removed over a longer Rosen wire. The identical procedure was performed into the right pulmonary artery through the other sheath. Right pulmonary artery pressure was measured at 50/23 with a mean of 31 mm Hg. Right and left 18 and 12 cm EKOS infusion catheters were then advanced into the right and left pulmonary arteries. TPA infusion was instituted.  FINDINGS: Imaging confirms right and left pulmonary artery catheter position in the descending branches. Contrast injected confirms positioning and pulmonary thromboembolism.  COMPLICATIONS: None  IMPRESSION: Successful he close bilateral pulmonary artery thrombolytic infusion.   Electronically Signed   By: Marybelle Killings M.D.   On: 09/29/2014 08:46   Ir Angiogram Selective Each Additional Vessel  09/29/2014   CLINICAL DATA:  Pulmonary thromboembolism.  Right heart strain.  EXAM: BILATERAL PULMONARY ARTERIOGRAPHY; ADDITIONAL ARTERIOGRAPHY; IR ULTRASOUND GUIDANCE VASC  ACCESS RIGHT; IR INFUSION THROMBOL ARTERIAL INITIAL (MS)  FLUOROSCOPY TIME:  6 minutes 24 seconds.  MEDICATIONS AND MEDICAL HISTORY: Versed 1 mg, Fentanyl 50 mcg.  Additional Medications: None.  ANESTHESIA/SEDATION: Moderate sedation time: 30 minutes  CONTRAST:  50 cc Omnipaque 300  PROCEDURE: The procedure, risks, benefits, and alternatives were explained to the patient. Questions regarding the procedure were encouraged and answered. The patient understands and consents to the procedure.  The right groin was prepped with Betadine in a sterile fashion, and a sterile drape was applied covering the operative field. A sterile gown and sterile gloves were used for the procedure.  Under sonographic guidance, a micropuncture needle was placed into the right common femoral vein and removed over a 018 wire which was up sized to a Bentson. A 7 French sheath was inserted. The identical procedure was performed in the same vein within additional 7 French sheath. A JB 1 catheter was advanced over a Bentson wire into the left pulmonary artery. Pressure and angiography was performed. Left pulmonary artery pressure was measured at 56/23 with a mean of 36 mm Hg. The catheter was removed over a longer Rosen wire. The identical procedure was performed into the right pulmonary artery through the other sheath. Right pulmonary artery pressure was measured at 50/23 with a mean of 31 mm Hg. Right and left 18 and 12 cm EKOS infusion catheters were then advanced into the right and left pulmonary arteries. TPA infusion was instituted.  FINDINGS: Imaging confirms right and left pulmonary artery catheter position in the descending branches. Contrast injected confirms positioning and pulmonary thromboembolism.  COMPLICATIONS: None  IMPRESSION: Successful he close bilateral pulmonary artery thrombolytic infusion.   Electronically Signed   By: Marybelle Killings M.D.   On: 09/29/2014 08:46   Ir Angiogram Selective Each Additional Vessel  09/29/2014    CLINICAL DATA:  Pulmonary thromboembolism.  Right heart strain.  EXAM: BILATERAL PULMONARY ARTERIOGRAPHY; ADDITIONAL ARTERIOGRAPHY; IR ULTRASOUND GUIDANCE VASC ACCESS RIGHT; IR INFUSION THROMBOL ARTERIAL INITIAL (MS)  FLUOROSCOPY TIME:  6 minutes 24 seconds.  MEDICATIONS AND MEDICAL HISTORY:  Versed 1 mg, Fentanyl 50 mcg.  Additional Medications: None.  ANESTHESIA/SEDATION: Moderate sedation time: 30 minutes  CONTRAST:  50 cc Omnipaque 300  PROCEDURE: The procedure, risks, benefits, and alternatives were explained to the patient. Questions regarding the procedure were encouraged and answered. The patient understands and consents to the procedure.  The right groin was prepped with Betadine in a sterile fashion, and a sterile drape was applied covering the operative field. A sterile gown and sterile gloves were used for the procedure.  Under sonographic guidance, a micropuncture needle was placed into the right common femoral vein and removed over a 018 wire which was up sized to a Bentson. A 7 French sheath was inserted. The identical procedure was performed in the same vein within additional 7 French sheath. A JB 1 catheter was advanced over a Bentson wire into the left pulmonary artery. Pressure and angiography was performed. Left pulmonary artery pressure was measured at 56/23 with a mean of 36 mm Hg. The catheter was removed over a longer Rosen wire. The identical procedure was performed into the right pulmonary artery through the other sheath. Right pulmonary artery pressure was measured at 50/23 with a mean of 31 mm Hg. Right and left 18 and 12 cm EKOS infusion catheters were then advanced into the right and left pulmonary arteries. TPA infusion was instituted.  FINDINGS: Imaging confirms right and left pulmonary artery catheter position in the descending branches. Contrast injected confirms positioning and pulmonary thromboembolism.  COMPLICATIONS: None  IMPRESSION: Successful he close bilateral pulmonary  artery thrombolytic infusion.   Electronically Signed   By: Marybelle Killings M.D.   On: 09/29/2014 08:46   Ir Ivc Filter Plmt / S&i /img Guid/mod Sed  10/02/2014   CLINICAL DATA:  Pulmonary thromboembolism. Peri-orbitall hemorrhage after anti coagulation.  EXAM: IVC FILTER,INFERIOR VENA CAVOGRAM  FLUOROSCOPY TIME:  1 minutes and 12 seconds.  MEDICATIONS AND MEDICAL HISTORY: Versed 1 mg, Fentanyl 50 mcg.  Additional Medications: None.  ANESTHESIA/SEDATION: Moderate sedation time: 15 minutes  CONTRAST:  40 cc Omnipaque 300  PROCEDURE: The procedure, risks, benefits, and alternatives were explained to the patient. Questions regarding the procedure were encouraged and answered. The patient understands and consents to the procedure.  The right neck was prepped with Betadine in a sterile fashion, and a sterile drape was applied covering the operative field. A sterile gown and sterile gloves were used for the procedure.  The Betadine vein was noted to be patent initially with ultrasound. Under sonographic guidance, a micropuncture needle was inserted into the right internal jugular vein (Ultrasound image documentation was performed). It was removed over an 018 wire which was upsized to a Frederick. The sheath was inserted over the wire and into the IVC. IVC venography was performed.  The temporary filter was then deployed in the infrarenal IVC. The sheath was removed and hemostasis was achieved with direct pressure.  FINDINGS: IVC venography confirms renal vein inflow at L1. No IVC thrombus or venous anomaly.  The image demonstrates placement of an IVC filter with its tip at the L1-2 disc.  COMPLICATIONS: None  IMPRESSION: Successful infrarenal IVC filter placement. This is a temporary filter. It can be removed or remain in place to become permanent.   Electronically Signed   By: Marybelle Killings M.D.   On: 10/02/2014 13:21   US Renal  10/11/2014   CLINICAL DATA:  Acute renal insufficiency  EXAM: RENAL/URINARY TRACT ULTRASOUND  COMPLETE  COMPARISON:  CT scan 2/ 19/ 15  FINDINGS:  Right Kidney:  Length: 9 mm. Echogenicity within normal limits. No mass or hydronephrosis visualized. Mild renal cortical thinning probable due to atrophy.  Left Kidney:  Length: 10.7 cm. Echogenicity within normal limits. No mass or hydronephrosis visualized. Mild renal cortical thinning probable due to atrophy.  Bladder:  Appears normal for degree of bladder distention.  IMPRESSION: 1. No hydronephrosis. No renal calculi. Bilateral mild renal cortical thinning probable due to atrophy. Unremarkable urinary bladder.   Electronically Signed   By: Lahoma Crocker M.D.   On: 10/11/2014 09:46   Ir US Guide Vasc Access Right  09/29/2014   CLINICAL DATA:  Pulmonary thromboembolism.  Right heart strain.  EXAM: BILATERAL PULMONARY ARTERIOGRAPHY; ADDITIONAL ARTERIOGRAPHY; IR ULTRASOUND GUIDANCE VASC ACCESS RIGHT; IR INFUSION THROMBOL ARTERIAL INITIAL (MS)  FLUOROSCOPY TIME:  6 minutes 24 seconds.  MEDICATIONS AND MEDICAL HISTORY: Versed 1 mg, Fentanyl 50 mcg.  Additional Medications: None.  ANESTHESIA/SEDATION: Moderate sedation time: 30 minutes  CONTRAST:  50 cc Omnipaque 300  PROCEDURE: The procedure, risks, benefits, and alternatives were explained to the patient. Questions regarding the procedure were encouraged and answered. The patient understands and consents to the procedure.  The right groin was prepped with Betadine in a sterile fashion, and a sterile drape was applied covering the operative field. A sterile gown and sterile gloves were used for the procedure.  Under sonographic guidance, a micropuncture needle was placed into the right common femoral vein and removed over a 018 wire which was up sized to a Bentson. A 7 French sheath was inserted. The identical procedure was performed in the same vein within additional 7 French sheath. A JB 1 catheter was advanced over a Bentson wire into the left pulmonary artery. Pressure and angiography was performed. Left  pulmonary artery pressure was measured at 56/23 with a mean of 36 mm Hg. The catheter was removed over a longer Rosen wire. The identical procedure was performed into the right pulmonary artery through the other sheath. Right pulmonary artery pressure was measured at 50/23 with a mean of 31 mm Hg. Right and left 18 and 12 cm EKOS infusion catheters were then advanced into the right and left pulmonary arteries. TPA infusion was instituted.  FINDINGS: Imaging confirms right and left pulmonary artery catheter position in the descending branches. Contrast injected confirms positioning and pulmonary thromboembolism.  COMPLICATIONS: None  IMPRESSION: Successful he close bilateral pulmonary artery thrombolytic infusion.   Electronically Signed   By: Marybelle Killings M.D.   On: 09/29/2014 08:46   Ir Infusion Thrombol Arterial Initial (ms)  09/29/2014   CLINICAL DATA:  Pulmonary thromboembolism.  Right heart strain.  EXAM: BILATERAL PULMONARY ARTERIOGRAPHY; ADDITIONAL ARTERIOGRAPHY; IR ULTRASOUND GUIDANCE VASC ACCESS RIGHT; IR INFUSION THROMBOL ARTERIAL INITIAL (MS)  FLUOROSCOPY TIME:  6 minutes 24 seconds.  MEDICATIONS AND MEDICAL HISTORY: Versed 1 mg, Fentanyl 50 mcg.  Additional Medications: None.  ANESTHESIA/SEDATION: Moderate sedation time: 30 minutes  CONTRAST:  50 cc Omnipaque 300  PROCEDURE: The procedure, risks, benefits, and alternatives were explained to the patient. Questions regarding the procedure were encouraged and answered. The patient understands and consents to the procedure.  The right groin was prepped with Betadine in a sterile fashion, and a sterile drape was applied covering the operative field. A sterile gown and sterile gloves were used for the procedure.  Under sonographic guidance, a micropuncture needle was placed into the right common femoral vein and removed over a 018 wire which was up sized to a Bentson. A 7 French sheath  was inserted. The identical procedure was performed in the same vein  within additional 7 French sheath. A JB 1 catheter was advanced over a Bentson wire into the left pulmonary artery. Pressure and angiography was performed. Left pulmonary artery pressure was measured at 56/23 with a mean of 36 mm Hg. The catheter was removed over a longer Rosen wire. The identical procedure was performed into the right pulmonary artery through the other sheath. Right pulmonary artery pressure was measured at 50/23 with a mean of 31 mm Hg. Right and left 18 and 12 cm EKOS infusion catheters were then advanced into the right and left pulmonary arteries. TPA infusion was instituted.  FINDINGS: Imaging confirms right and left pulmonary artery catheter position in the descending branches. Contrast injected confirms positioning and pulmonary thromboembolism.  COMPLICATIONS: None  IMPRESSION: Successful he close bilateral pulmonary artery thrombolytic infusion.   Electronically Signed   By: Marybelle Killings M.D.   On: 09/29/2014 08:46   Ir Jacolyn Reedy F/u Eval Art/ven Final Day (ms)  09/29/2014   CLINICAL DATA:  Follow-up EKOS pulmonary artery thrombolytic therapy.  EXAM: IR THROMB F/U EVAL ART/VEN FINAL DAY  FLUOROSCOPY TIME:  20 seconds.  MEDICATIONS AND MEDICAL HISTORY: None  ANESTHESIA/SEDATION: None  CONTRAST:  None  PROCEDURE: The procedure, risks, benefits, and alternatives were explained to the patient. Questions regarding the procedure were encouraged and answered. The patient understands and consents to the procedure.  Right and left pulmonary artery pressures were obtained. Right pulmonary artery pressure is 35/27 with a mean of 31 mm Hg. That of the left is 34/29 with a mean of 31 mm Hg. The catheters and sheaths were removed. Hemostasis was achieved with direct pressure and VPAD.  FINDINGS: Fluoroscopic imaging confirms stable position of the right and left pulmonary artery EKOS catheters.  COMPLICATIONS: None  IMPRESSION: Successful conclusion of bilateral pulmonary artery EKOS thrombo lytic  therapy. Peak systolic pressures have significantly improved from 56 to 35 mm Hg in the right pulmonary artery and 50 to 34 mm Hg in the left pulmonary artery.   Electronically Signed   By: Marybelle Killings M.D.   On: 09/29/2014 09:21    CBC  Recent Labs Lab 10/10/14 2052 10/11/14 1055 10/15/14 2231 10/16/14 0840 10/17/14 0402  WBC 11.3* 8.2 12.4* 10.5 9.5  HGB 11.5* 10.2* 9.3* 9.1* 9.3*  HCT 38.5 33.8* 30.5* 29.3* 30.4*  PLT 257 199 237 209 258  MCV 84.6 85.4 81.8 82.5 82.4  MCH 25.3* 25.8* 24.9* 25.6* 25.2*  MCHC 29.9* 30.2 30.5 31.1 30.6  RDW 22.4* 22.7* 21.8* 21.8* 21.8*  LYMPHSABS 0.6*  --   --   --  1.5  MONOABS 0.6  --   --   --  1.0  EOSABS 0.0  --   --   --  0.2  BASOSABS 0.0  --   --   --  0.0    Chemistries   Recent Labs Lab 10/10/14 2052 10/11/14 1055 10/12/14 0530 10/15/14 2231 10/16/14 0840 10/17/14 0402  NA 138 140 138 130* 135 137  K 4.3 3.7 4.4 4.2 3.8 4.0  CL 102 108 105 97 100 100  CO2 23 22 23 22 24 27   GLUCOSE 172* 125* 121* 146* 119* 115*  BUN 16 18 15 11 10 9   CREATININE 1.73* 1.10 0.88 0.79 0.72 0.63  CALCIUM 9.8 8.9 9.5 8.5 8.7 8.7  AST 30 23  --   --   --  42*  ALT 19 15  --   --   --  47*  ALKPHOS 68 55  --   --   --  97  BILITOT 0.6 0.6  --   --   --  0.7   ------------------------------------------------------------------------------------------------------------------ estimated creatinine clearance is 70.6 mL/min (by C-G formula based on Cr of 0.63). ------------------------------------------------------------------------------------------------------------------ No results for input(s): HGBA1C in the last 72 hours. ------------------------------------------------------------------------------------------------------------------ No results for input(s): CHOL, HDL, LDLCALC, TRIG, CHOLHDL, LDLDIRECT in the last 72 hours. ------------------------------------------------------------------------------------------------------------------ No  results for input(s): TSH, T4TOTAL, T3FREE, THYROIDAB in the last 72 hours.  Invalid input(s): FREET3 ------------------------------------------------------------------------------------------------------------------ No results for input(s): VITAMINB12, FOLATE, FERRITIN, TIBC, IRON, RETICCTPCT in the last 72 hours.  Coagulation profile  Recent Labs Lab 10/10/14 2052 10/17/14 0402  INR 1.09 1.05    No results for input(s): DDIMER in the last 72 hours.  Cardiac Enzymes  Recent Labs Lab 10/12/14 1232 10/12/14 1645 10/12/14 2330  TROPONINI <0.03 <0.03 <0.03   ------------------------------------------------------------------------------------------------------------------ Invalid input(s): POCBNP  No results for input(s): GLUCAP in the last 72 hours.   Christalynn Boise DO. [facility] 10/17/2014, 8:32 AM  Pager: [phone]     After 7pm go to [email or web address] - password [email or web address]  Call night coverage person covering after 7pm

## 2014-10-17 NOTE — Progress Notes (Signed)
Name: [name] MRN: [id number] DOB: 1940/09/20    ADMISSION DATE:  10/10/2014 CONSULTATION DATE:  10/14/2014  REFERRING MD :  Dr. Tana Coast  CHIEF COMPLAINT:  syncope  BRIEF PATIENT DESCRIPTION:  74 year old female with PMH as below. She was admitted to [facility] ED 09/27/14 with bilateral PE and received thrombolysis via EKOS on 4/9. Heprain gtt was eventually transitioned to xarelto, however, she developed periorbital ecchymosis. Xarelto was D/c'd and IVF filter was placed. She was discharged to home 4/14. 4/21 she again presented to [facility] c/o orthostasis and syncope. She was admitted and treated initially with IVF, which resulted in massive lower extremity edema. Echocardiogram has normalized since prior admission. Based on relatively normal heart and kidney function there is concern that her IVC filter may be obstructing. PCCM asked to consult.     SIGNIFICANT EVENTS  4/09>>>EKOS 4/10>> 1uRBC transfusion 4/12 increased periorbital ecchymosis 4/13 IVC filter placed 4/21 admitted for syncope  STUDIES:  4/08 CT chest >> Moderate burden of bilateral pulmonary emboli. Positive for acute PE with CT evidence of right heart strain (RV/LV Ratio = 1.9).  4/08 Echo >> EF 75%, mod RV dilation with decreased systolic fx, PAS 61 mmHg, mod/severe TR 4/09 LE dopplers >>> acute DVT in rt popliteal vein, rt TB vein, and rt peroneal vein 4/21 CT head > No acute abnormalities 4/22 Renal US > No hydronephrosis. No renal calculi. Bilateral mild renal cortical thinning probable due to atrophy. 4/22 Echo > LVEF 60-65%, Grade 1 DD.  Improvement from prior study. 4/23 MRI brain > No acute intracranial abnormality. 10/15/2014: Diagnosis of Acute extensive bilateral iliofemoral  DVT on duplex. CT abdomen  shows distal to IVC - dvt likely. She is in bed. Periorbital echymmoses nearly resolved from prior admit. Denies bleeding here or at home. No visual issues. No neuro deficits. START IV Heparin 10/16/14: IR /heme/pccm  join consult - consensus to go ahead with IR guided local thrombolysis of DVT (c/w LE Swelling 2X normal size and unable to weweight bear or bend knee). Heme consult - risk factors of Tamoxifen and cancer identified   SUBJECTIVE/OVERNIGHT/INTERVAL HX 10/17/14: Feels thigh size has improved but says IV heparin will take long time to resolve. Pharmacy has apparently cleared rechallenge with tPA per Otilio Miu.  Patient wants to go ahead with local lysis. INsurance approval +.  No bleeding   VITAL SIGNS: Temp:  [98.6 F (37 C)-99.6 F (37.6 C)] 98.6 F (37 C) (04/28 0542) Pulse Rate:  [88-108] 88 (04/28 0542) Resp:  [16] 16 (04/28 0542) BP: (139-153)/(61-68) 139/61 mmHg (04/28 0542) SpO2:  [95 %-97 %] 95 % (04/28 0542)  PHYSICAL EXAMINATION: General:  74 year old female in NAD, sitting in chair with feet extended Neuro:  Alert, oriented, non-focal HEENT:  [place]/AT, No JVD noted, PERRL Cardiovascular:  RRR, no MRG Lungs:  Clear bilateral breath sounds. Somewhat diminished R Abdomen:  Soft, non-tender, non-distended Musculoskeletal:  BLE edema and pain R > L  SCDs +  - per patient swelling improved - to me looks same Skin:  Grossly intact  PULMONARY No results for input(s): PHART, PCO2ART, PO2ART, HCO3, TCO2, O2SAT in the last 168 hours.  Invalid input(s): PCO2, PO2  CBC  Recent Labs Lab 10/15/14 2231 10/16/14 0840 10/17/14 0402  HGB 9.3* 9.1* 9.3*  HCT 30.5* 29.3* 30.4*  WBC 12.4* 10.5 9.5  PLT 237 209 258    COAGULATION  Recent Labs Lab 10/10/14 2052 10/17/14 0402  INR 1.09 1.05  CARDIAC    Recent Labs Lab 10/12/14 1232 10/12/14 1645 10/12/14 2330  TROPONINI <0.03 <0.03 <0.03   No results for input(s): PROBNP in the last 168 hours.   CHEMISTRY  Recent Labs Lab 10/11/14 1055 10/12/14 0530 10/15/14 2231 10/16/14 0840 10/17/14 0402  NA 140 138 130* 135 137  K 3.7 4.4 4.2 3.8 4.0  CL 108 105 97 100 100  CO2 22 23 22 24 27   GLUCOSE 125* 121*  146* 119* 115*  BUN 18 15 11 10 9   CREATININE 1.10 0.88 0.79 0.72 0.63  CALCIUM 8.9 9.5 8.5 8.7 8.7   Estimated Creatinine Clearance: 70.6 mL/min (by C-G formula based on Cr of 0.63).   LIVER  Recent Labs Lab 10/10/14 2052 10/11/14 1055 10/17/14 0402  AST 30 23 42*  ALT 19 15 47*  ALKPHOS 68 55 97  BILITOT 0.6 0.6 0.7  PROT 6.8 5.5* 5.4*  ALBUMIN 3.8 3.1* 2.4*  INR 1.09  --  1.05     INFECTIOUS No results for input(s): LATICACIDVEN, PROCALCITON in the last 168 hours.   ENDOCRINE CBG (last 3)  No results for input(s): GLUCAP in the last 72 hours.       IMAGING [phone] - images reviewed personally if listed below No results found.   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 Patient accepts risk - Move to ICU for monitoring due to high risk for anticipated bleeding - Bed rest - NPO  - DC PT/OT - start oral anticoagulation when stable and post lysis infusion complete - she is at very high risk for clotting that minor bleeds to be accepted and NOAC to be restarrted when any bleeding episode resolved   Care coordinated with IR and Nurses and patient/husband    The patient is critically ill with multiple organ systems failure and requires high complexity decision making for assessment and support, frequent evaluation and titration of therapies,  application of advanced monitoring technologies and extensive interpretation of multiple databases.   Critical Care Time devoted to patient care services described in this note is  35  Minutes. This time reflects time of care of this signee Dr Brand Males. This critical care time does not reflect procedure time, or teaching time or supervisory time of PA/NP/Med student/Med Resident etc but could involve care discussion time    Dr. Brand Males, M.D., [facility]C.P Pulmonary and Critical Care Medicine Staff Physician [place] [facility] Pager: [phone], If no answer or between  15:00h - 7:00h: call [phone]  10/17/2014 11:23 AM

## 2014-10-17 NOTE — Progress Notes (Signed)
ANTICOAGULATION CONSULT NOTE - Follow Up Consult  Pharmacy Consult for Heparin  Indication: pulmonary embolus and DVT  No Known Allergies  Patient Measurements: Height: 5\' 5"  (165.1 cm) Weight: 199 lb 4.7 oz (90.4 kg) IBW/kg (Calculated) : 57 Vital Signs: Temp: 98.6 F (37 C) (04/28 0542) Temp Source: Oral (04/28 0542) BP: 139/61 mmHg (04/28 0542) Pulse Rate: 88 (04/28 0542)  Labs:  Recent Labs  10/15/14 2231 10/16/14 0840 10/16/14 1942 10/17/14 0402 10/17/14 1207  HGB 9.3* 9.1*  --  9.3*  --   HCT 30.5* 29.3*  --  30.4*  --   PLT 237 209  --  258  --   APTT  --   --   --  37  --   LABPROT  --   --   --  13.9  --   INR  --   --   --  1.05  --   HEPARINUNFRC 0.33 1.00* <0.10* 0.20* 0.32  CREATININE 0.79 0.72  --  0.63  --     Estimated Creatinine Clearance: 70.6 mL/min (by C-G formula based on Cr of 0.63).   Assessment: 68 YOF with h/o of bilateral PE and new bilateral distal DVTs on IV heparin.  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 IR to perform local lysis soon. Patient has an IVC filter in place which will remain in place for a few more months to avoid dislodging a clot.  HL this afternoon is therapeutic again on heparin 1200 units/hr. H/H and Plt low but stable.   Goal of Therapy:  Heparin level 0.3-0.7 units/ml Monitor platelets by anticoagulation protocol: Yes   Plan:  Continue heparin infusion at 1200 units/hr  HL with AM labs Daily HL, CBC Moving ahead with local lysis by IR today - transferring to ICU after due to increased bleeding risk. F/u after lysis   Albertina Parr, PharmD., BCPS Clinical Pharmacist Pager [phone]

## 2014-10-17 NOTE — Progress Notes (Signed)
PT Cancellation Note  Patient Details Name: [name] MRN: [id number] DOB: September 08, 1940   Cancelled Treatment:    Reason Eval/Treat Not Completed: Medical issues which prohibited therapy; patient awaiting procedure for thrombolysis.  Will cancel PT today.  Also note MD discontinued PT so please  Re-order when appropriate.  Thanks   WYNN,CYNDI 10/17/2014, 12:12 PM  Magda Kiel, [place] 10/17/2014

## 2014-10-17 NOTE — Progress Notes (Signed)
ANTICOAGULATION CONSULT NOTE - Follow Up Consult  Pharmacy Consult for heparin Indication: pulmonary embolus and DVT   Labs:  Recent Labs  10/15/14 2231 10/16/14 0840  10/17/14 0402 10/17/14 1207 10/17/14 1950  HGB 9.3* 9.1*  --  9.3*  --  9.1*  HCT 30.5* 29.3*  --  30.4*  --  30.3*  PLT 237 209  --  258  --  234  APTT  --   --   --  37  --   --   LABPROT  --   --   --  13.9  --   --   INR  --   --   --  1.05  --   --   HEPARINUNFRC 0.33 1.00*  < > 0.20* 0.32 0.10*  CREATININE 0.79 0.72  --  0.63  --   --   < > = values in this interval not displayed.    Assessment: 74yo female subtherapeutic on heparin after resumed; of note this lab was drawn from same line that heparin was infusing, which can cause falsely higher level, so true level may be undetectable.  Goal of Therapy:  Heparin level 0.2-0.5 units/ml   Plan:  Will increase heparin gtt by 2 units/kg/hr to 1400 units/hr and check level in 6hr; RN has been instructed to hold heparin gtt x35min then flush line prior to drawing if no other line available while TNKase is running.  Wynona Neat, PharmD, BCPS  10/17/2014,11:45 PM

## 2014-10-17 NOTE — Procedures (Signed)
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  Procedure Details:   Informed consent obtained.  Bilateral venous access obtained in the popliteal fossas.  Bilateral venograms demonstrate extensive DVT involving both legs and extending into the iliac veins and IVC.  The clot extends into the IVC filter.  Bilateral infusion catheters were placed.  Catheter tips placed just above the filter.  Findings: See Procedure details.  Complications: None     Condition: Stable  Plan: Start catheter-directed thrombolysis thru both legs.  0.25 mg/hr of TNK thru each leg for total of 0.5 mg/hr

## 2014-10-17 NOTE — Progress Notes (Signed)
OT Cancellation Note  Patient Details Name: [name] MRN: [id number] DOB: Feb 03, 1941   Cancelled Treatment:    Reason Eval/Treat Not Completed: Medical issues which prohibited therapy. Pt scheduled for thrombectomy today. Acute OT will hold until after procedure and will follow up as appropriate to complete evaluation.   Villa Herb M   Cyndie Chime, OTR/L Occupational Therapist [phone] (pager)  10/17/2014, 8:12 AM

## 2014-10-17 NOTE — Progress Notes (Signed)
ANTICOAGULATION CONSULT NOTE - Follow Up Consult  Pharmacy Consult for Heparin  Indication: pulmonary embolus and DVT  No Known Allergies  Patient Measurements: Height: 5\' 5"  (165.1 cm) Weight: 199 lb 4.7 oz (90.4 kg) IBW/kg (Calculated) : 57 Vital Signs: Temp: 99.6 F (37.6 C) (04/27 2240) Temp Source: Oral (04/27 2240) BP: 153/62 mmHg (04/27 2240) Pulse Rate: 108 (04/27 2240)  Labs:  Recent Labs  10/15/14 2231 10/16/14 0840 10/16/14 1942 10/17/14 0402  HGB 9.3* 9.1*  --  9.3*  HCT 30.5* 29.3*  --  30.4*  PLT 237 209  --  258  HEPARINUNFRC 0.33 1.00* <0.10* 0.20*  CREATININE 0.79 0.72  --   --     Estimated Creatinine Clearance: 70.6 mL/min (by C-G formula based on Cr of 0.72).   Assessment: Sub-therapeutic heparin level x 1, Hgb low/stable, IVC filter in place, no issues per RN.   Goal of Therapy:  Heparin level 0.3-0.7 units/ml Monitor platelets by anticoagulation protocol: Yes   Plan:  -Increase heparin to 1200 units/hr -1200 HL -Daily CBC/HL -Monitor for bleeding, trend Hgb  Narda Bonds 10/17/2014,5:01 AM

## 2014-10-17 NOTE — Progress Notes (Signed)
ANTICOAGULATION CONSULT NOTE - Follow Up Consult  Pharmacy Consult for Heparin  Indication: pulmonary embolus and DVT  No Known Allergies  Patient Measurements: Height: 5\' 5"  (165.1 cm) Weight: 222 lb 10.6 oz (101 kg) IBW/kg (Calculated) : 57 Vital Signs: Temp: 98.4 F (36.9 C) (04/28 1405) Temp Source: Oral (04/28 1405) BP: 123/53 mmHg (04/28 1635) Pulse Rate: 95 (04/28 1635)  Labs:  Recent Labs  10/15/14 2231 10/16/14 0840 10/16/14 1942 10/17/14 0402 10/17/14 1207  HGB 9.3* 9.1*  --  9.3*  --   HCT 30.5* 29.3*  --  30.4*  --   PLT 237 209  --  258  --   APTT  --   --   --  37  --   LABPROT  --   --   --  13.9  --   INR  --   --   --  1.05  --   HEPARINUNFRC 0.33 1.00* <0.10* 0.20* 0.32  CREATININE 0.79 0.72  --  0.63  --     Estimated Creatinine Clearance: 74.9 mL/min (by C-G formula based on Cr of 0.63).   Assessment: 23 YOF with h/o of bilateral PE and new bilateral distal DVTs on IV heparin.  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 Now s/p IR for TNKase lysis. Heparin was resumed in IR and was running at 1200 units/hr on transport to 24M. 24M nurses are unaware if heparin was held - unable to reach RN from IR at this time. Heparin level this afternoon was therapeutic again on heparin 1200 units/hr- so ok resuming at this rate. H/H and Plt low but stable. Will follow-up heparin level in 6 hours.   Goal of Therapy:  Heparin level 0.2 to 0.5 units/ml Monitor platelets by anticoagulation protocol: Yes   Plan:  Resume heparin infusion at 1200 units/hr  Follow-up 6 hour heparin level per protocol Daily HL, CBC  Sloan Leiter, PharmD, BCPS Clinical Pharmacist [phone]

## 2014-10-17 NOTE — Progress Notes (Signed)
Patient ID: [name], female   DOB: 09-Feb-1941, 74 y.o.   MRN: [id number]   Insurance has been certified per IR scheduler  Pt will sign consent in IR with Reference # on consent

## 2014-10-18 ENCOUNTER — Other Ambulatory Visit: Payer: Medicare Other

## 2014-10-18 ENCOUNTER — Inpatient Hospital Stay (HOSPITAL_COMMUNITY): Payer: Medicare Other

## 2014-10-18 ENCOUNTER — Ambulatory Visit: Payer: Medicare Other | Admitting: Hematology and Oncology

## 2014-10-18 LAB — CBC WITH DIFFERENTIAL/PLATELET
Basophils Absolute: 0 10*3/uL (ref 0.0–0.1)
Basophils Relative: 0 % (ref 0–1)
Eosinophils Absolute: 0.1 10*3/uL (ref 0.0–0.7)
Eosinophils Relative: 1 % (ref 0–5)
HCT: 29.6 % — ABNORMAL LOW (ref 36.0–46.0)
Hemoglobin: 9.2 g/dL — ABNORMAL LOW (ref 12.0–15.0)
Lymphocytes Relative: 10 % — ABNORMAL LOW (ref 12–46)
Lymphs Abs: 1.2 10*3/uL (ref 0.7–4.0)
MCH: 25.3 pg — ABNORMAL LOW (ref 26.0–34.0)
MCHC: 31.1 g/dL (ref 30.0–36.0)
MCV: 81.5 fL (ref 78.0–100.0)
Monocytes Absolute: 1 10*3/uL (ref 0.1–1.0)
Monocytes Relative: 8 % (ref 3–12)
Neutro Abs: 10 10*3/uL — ABNORMAL HIGH (ref 1.7–7.7)
Neutrophils Relative %: 81 % — ABNORMAL HIGH (ref 43–77)
Platelets: 181 10*3/uL (ref 150–400)
RBC: 3.63 MIL/uL — ABNORMAL LOW (ref 3.87–5.11)
RDW: 21.6 % — ABNORMAL HIGH (ref 11.5–15.5)
WBC: 12.3 10*3/uL — ABNORMAL HIGH (ref 4.0–10.5)

## 2014-10-18 LAB — FIBRINOGEN
Fibrinogen: 129 mg/dL — ABNORMAL LOW (ref 204–475)
Fibrinogen: 145 mg/dL — ABNORMAL LOW (ref 204–475)

## 2014-10-18 LAB — HEPARIN LEVEL (UNFRACTIONATED)
Heparin Unfractionated: 0.29 IU/mL — ABNORMAL LOW (ref 0.30–0.70)
Heparin Unfractionated: <0.1 IU/mL — ABNORMAL LOW (ref 0.30–0.70)

## 2014-10-18 LAB — PHOSPHORUS: Phosphorus: 3 mg/dL (ref 2.3–4.6)

## 2014-10-18 LAB — CBC
HCT: 29 % — ABNORMAL LOW (ref 36.0–46.0)
Hemoglobin: 9 g/dL — ABNORMAL LOW (ref 12.0–15.0)
MCH: 25.5 pg — ABNORMAL LOW (ref 26.0–34.0)
MCHC: 31 g/dL (ref 30.0–36.0)
MCV: 82.2 fL (ref 78.0–100.0)
Platelets: 152 10*3/uL (ref 150–400)
RBC: 3.53 MIL/uL — ABNORMAL LOW (ref 3.87–5.11)
RDW: 21.6 % — ABNORMAL HIGH (ref 11.5–15.5)
WBC: 12.7 10*3/uL — ABNORMAL HIGH (ref 4.0–10.5)

## 2014-10-18 LAB — BASIC METABOLIC PANEL
Anion gap: 8 (ref 5–15)
BUN: 10 mg/dL (ref 6–23)
CO2: 27 mmol/L (ref 19–32)
Calcium: 8.5 mg/dL (ref 8.4–10.5)
Chloride: 100 mmol/L (ref 96–112)
Creatinine, Ser: 0.78 mg/dL (ref 0.50–1.10)
GFR calc Af Amer: >90 mL/min (ref >90)
GFR calc non Af Amer: 82 mL/min — ABNORMAL LOW (ref >90)
Glucose, Bld: 140 mg/dL — ABNORMAL HIGH (ref 70–99)
Potassium: 4 mmol/L (ref 3.5–5.1)
Sodium: 135 mmol/L (ref 135–145)

## 2014-10-18 LAB — MAGNESIUM: Magnesium: 2.2 mg/dL (ref 1.5–2.5)

## 2014-10-18 MED ORDER — HEPARIN (PORCINE) IN NACL 100-0.45 UNIT/ML-% IJ SOLN
1600.0000 [IU]/h | INTRAMUSCULAR | Status: DC
  Administered 2014-10-18: 1200 [IU]/h via INTRAVENOUS
  Administered 2014-10-19: 1400 [IU]/h via INTRAVENOUS
  Administered 2014-10-20: 1500 [IU]/h via INTRAVENOUS
  Administered 2014-10-21: 1600 [IU]/h via INTRAVENOUS
  Filled 2014-10-18 (×7): qty 250

## 2014-10-18 MED ORDER — IODIXANOL 320 MG/ML IV SOLN
100.0000 mL | Freq: Once | INTRAVENOUS | Status: AC | PRN
  Administered 2014-10-18: 100 mL via INTRAVENOUS

## 2014-10-18 MED ORDER — CHLORHEXIDINE GLUCONATE 4 % EX LIQD
CUTANEOUS | Status: AC
  Filled 2014-10-18: qty 15

## 2014-10-18 MED ORDER — FENTANYL CITRATE (PF) 100 MCG/2ML IJ SOLN
INTRAMUSCULAR | Status: AC
  Filled 2014-10-18: qty 2

## 2014-10-18 MED ORDER — MIDAZOLAM HCL 2 MG/2ML IJ SOLN
INTRAMUSCULAR | Status: AC
  Filled 2014-10-18: qty 2

## 2014-10-18 MED ORDER — LIDOCAINE HCL 1 % IJ SOLN
INTRAMUSCULAR | Status: AC
  Filled 2014-10-18: qty 20

## 2014-10-18 MED ORDER — HEPARIN (PORCINE) IN NACL 100-0.45 UNIT/ML-% IJ SOLN
1200.0000 [IU]/h | INTRAMUSCULAR | Status: DC
  Filled 2014-10-18: qty 250

## 2014-10-18 MED ORDER — LIDOCAINE HCL (PF) 1 % IJ SOLN
INTRAMUSCULAR | Status: AC
  Filled 2014-10-18: qty 30

## 2014-10-18 MED ORDER — FENTANYL CITRATE (PF) 100 MCG/2ML IJ SOLN
INTRAMUSCULAR | Status: AC | PRN
  Administered 2014-10-18 (×2): 50 ug via INTRAVENOUS

## 2014-10-18 MED ORDER — FENTANYL CITRATE (PF) 100 MCG/2ML IJ SOLN: 25.0000 ug | INTRAMUSCULAR | Status: DC | PRN

## 2014-10-18 MED ORDER — FAMOTIDINE 40 MG PO TABS
40.0000 mg | ORAL_TABLET | Freq: Once | ORAL | Status: AC
  Administered 2014-10-18: 40 mg via ORAL
  Filled 2014-10-18: qty 1

## 2014-10-18 MED ORDER — MIDAZOLAM HCL 2 MG/2ML IJ SOLN
INTRAMUSCULAR | Status: AC | PRN
  Administered 2014-10-18 (×2): 1 mg via INTRAVENOUS

## 2014-10-18 NOTE — Progress Notes (Signed)
ANTICOAGULATION CONSULT NOTE - Follow Up Consult  Pharmacy Consult for Heparin Indication: pulmonary embolus and DVT  No Known Allergies  Patient Measurements: Height: 5\' 5"  (165.1 cm) Weight: 222 lb 10.6 oz (101 kg) IBW/kg (Calculated) : 57 Heparin Dosing Weight: 80 kg  Vital Signs: Temp: 98.6 F (37 C) (04/29 1145) Temp Source: Oral (04/29 1145) BP: 146/68 mmHg (04/29 0800) Pulse Rate: 104 (04/29 0800)  Labs:  Recent Labs  10/16/14 0840  10/17/14 0402 10/17/14 1207 10/17/14 1950 10/18/14 0244 10/18/14 0605 10/18/14 0929  HGB 9.1*  --  9.3*  --  9.1* 9.2*  --  9.0*  HCT 29.3*  --  30.4*  --  30.3* 29.6*  --  29.0*  PLT 209  --  258  --  234 181  --  152  APTT  --   --  37  --   --   --   --   --   LABPROT  --   --  13.9  --   --   --   --   --   INR  --   --  1.05  --   --   --   --   --   HEPARINUNFRC 1.00*  < > 0.20* 0.32 0.10*  --  0.29*  --   CREATININE 0.72  --  0.63  --   --  0.78  --   --   < > = values in this interval not displayed.  Estimated Creatinine Clearance: 74.9 mL/min (by C-G formula based on Cr of 0.78).   Assessment: 80 YOF with h/o of bilateral PE and new bilateral distal DVTs on IV heparin. 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 Now s/p IR for TNKase lysis. Heparin was resumed in IR.   Heparin level this morning was therapeutic at the lower end of the range (HL 0.29, goal of 0.2-0.5). Around 0830 this AM, the RN noted bleeding around the PICC site this morning and heparin was turned off. Per CCM, to hold for 6 hours and resume around 1500 this afternoon. Will resume at a lower rate and monitor closely.   Goal of Therapy:  Heparin level 0.2-0.5 units/ml (while on TNKase) Monitor platelets by anticoagulation protocol: Yes   Plan:  1. Resume Heparin at 1200 units/hr (12 ml/hr) starting at 1500 today 2. Will continue to monitor for any signs/symptoms of bleeding and will follow up with heparin level in 6 hours   Alycia Rossetti, PharmD, BCPS Clinical Pharmacist Pager: [phone] 10/18/2014 12:34 PM

## 2014-10-18 NOTE — Progress Notes (Signed)
UR completed.  Treylon Henard, RN BSN M[facility] CCM Trauma/Neuro ICU Case Manager [phone]  

## 2014-10-18 NOTE — Progress Notes (Signed)
ANTICOAGULATION CONSULT NOTE - Follow Up Consult  Pharmacy Consult for heparin Indication: pulmonary embolus and DVT  Labs:  Recent Labs  10/16/14 0840  10/17/14 0402 10/17/14 1207 10/17/14 1950 10/18/14 0244 10/18/14 0605  HGB 9.1*  --  9.3*  --  9.1* 9.2*  --   HCT 29.3*  --  30.4*  --  30.3* 29.6*  --   PLT 209  --  258  --  234 181  --   APTT  --   --  37  --   --   --   --   LABPROT  --   --  13.9  --   --   --   --   INR  --   --  1.05  --   --   --   --   HEPARINUNFRC 1.00*  < > 0.20* 0.32 0.10*  --  0.29*  CREATININE 0.72  --  0.63  --   --  0.78  --   < > = values in this interval not displayed.   Assessment/Plan:  74yo female therapeutic on heparin after rate increase. Will continue gtt at current rate and confirm stable with additional level.   Wynona Neat, PharmD, BCPS  10/18/2014,6:49 AM

## 2014-10-18 NOTE — Progress Notes (Signed)
No new complaints. Seems irritable. No distress. Mild to moderate bleeding from PICC site. Heparin infusion held for 6 hrs 4/29.  For return to IR 4/29 for venogram  Filed Vitals:   10/18/14 0600 10/18/14 0700 10/18/14 0800 10/18/14 0806  BP: 143/59 128/58 146/68   Pulse: 102 98 104   Temp:    98 F (36.7 C)  TempSrc:    Oral  Resp: 21 24 18    Height:      Weight:      SpO2: 93% 94% 95%    NAD HEENT WNL - resolving periorbital ecchymoses No JVD Chest clear RRR s M Obese, soft, NABS Mild symmetric LE edema Neuro intact  BMET    Component Value Date/Time   NA 135 10/18/2014 0244   NA 141 05/08/2014 1417   K 4.0 10/18/2014 0244   K 3.8 05/08/2014 1417   CL 100 10/18/2014 0244   CO2 27 10/18/2014 0244   CO2 27 05/08/2014 1417   GLUCOSE 140* 10/18/2014 0244   GLUCOSE 125 05/08/2014 1417   BUN 10 10/18/2014 0244   BUN 13.7 05/08/2014 1417   CREATININE 0.78 10/18/2014 0244   CREATININE 0.8 05/08/2014 1417   CALCIUM 8.5 10/18/2014 0244   CALCIUM 9.7 05/08/2014 1417   GFRNONAA 82* 10/18/2014 0244   GFRAA >90 10/18/2014 0244    CBC    Component Value Date/Time   WBC 12.7* 10/18/2014 0929   WBC 8.1 05/08/2014 1417   RBC 3.53* 10/18/2014 0929   RBC 4.73 05/08/2014 1417   HGB 9.0* 10/18/2014 0929   HGB 14.0 05/08/2014 1417   HCT 29.0* 10/18/2014 0929   HCT 43.4 05/08/2014 1417   PLT 152 10/18/2014 0929   PLT 230 05/08/2014 1417   MCV 82.2 10/18/2014 0929   MCV 91.8 05/08/2014 1417   MCH 25.5* 10/18/2014 0929   MCH 29.6 05/08/2014 1417   MCHC 31.0 10/18/2014 0929   MCHC 32.3 05/08/2014 1417   RDW 21.6* 10/18/2014 0929   RDW 13.8 05/08/2014 1417   LYMPHSABS 1.2 10/18/2014 0244   LYMPHSABS 1.7 05/08/2014 1417   MONOABS 1.0 10/18/2014 0244   MONOABS 0.5 05/08/2014 1417   EOSABS 0.1 10/18/2014 0244   EOSABS 0.2 05/08/2014 1417   BASOSABS 0.0 10/18/2014 0244   BASOSABS 0.0 05/08/2014 1417    No new CXR  IMPRESSION: Recent PE - s/p EKOS lysis S/P IVC  filter and cessation of anticoagulation due to spontaneous periorbital hematomas Readmission 4/21 to [facility] service with syncope and sever LE edema due to obstruction of IVC filter Presently undergoing directed lysis of LE/IVC clot per IR Mild-mod bleeding from PICC site  PLAN: Further eval and mgmt of lytic therapy per IR Holding heparin X 6 hrs - discussed with Pharm Bed rest until lytic therapy completed Diet ordered Restart rivaroxaban after completion of lytic therapy Merton Border, MD ; [facility] service Mobile [phone].  After 5:30 PM or weekends, call [phone]

## 2014-10-18 NOTE — Procedures (Signed)
BLE venous lysis check Angiojet BLE LCIV PTA 12 mm No comp

## 2014-10-19 DIAGNOSIS — J9811 Atelectasis: Secondary | ICD-10-CM

## 2014-10-19 DIAGNOSIS — C50211 Malignant neoplasm of upper-inner quadrant of right female breast: Secondary | ICD-10-CM

## 2014-10-19 DIAGNOSIS — R5381 Other malaise: Secondary | ICD-10-CM

## 2014-10-19 DIAGNOSIS — I8290 Acute embolism and thrombosis of unspecified vein: Secondary | ICD-10-CM

## 2014-10-19 DIAGNOSIS — R0902 Hypoxemia: Secondary | ICD-10-CM

## 2014-10-19 LAB — CBC WITH DIFFERENTIAL/PLATELET
BASOS ABS: 0 10*3/uL (ref 0.0–0.1)
Basophils Relative: 0 % (ref 0–1)
Eosinophils Absolute: 0.2 10*3/uL (ref 0.0–0.7)
Eosinophils Relative: 2 % (ref 0–5)
HEMATOCRIT: 23.4 % — AB (ref 36.0–46.0)
HEMOGLOBIN: 7.1 g/dL — AB (ref 12.0–15.0)
LYMPHS ABS: 1.6 10*3/uL (ref 0.7–4.0)
LYMPHS PCT: 15 % (ref 12–46)
MCH: 25 pg — ABNORMAL LOW (ref 26.0–34.0)
MCHC: 30.3 g/dL (ref 30.0–36.0)
MCV: 82.4 fL (ref 78.0–100.0)
MONOS PCT: 7 % (ref 3–12)
Monocytes Absolute: 0.7 10*3/uL (ref 0.1–1.0)
Neutro Abs: 8 10*3/uL — ABNORMAL HIGH (ref 1.7–7.7)
Neutrophils Relative %: 76 % (ref 43–77)
PLATELETS: 102 10*3/uL — AB (ref 150–400)
RBC: 2.84 MIL/uL — ABNORMAL LOW (ref 3.87–5.11)
RDW: 22 % — AB (ref 11.5–15.5)
WBC: 10.5 10*3/uL (ref 4.0–10.5)

## 2014-10-19 LAB — HEPARIN LEVEL (UNFRACTIONATED)
Heparin Unfractionated: 0.12 IU/mL — ABNORMAL LOW (ref 0.30–0.70)
Heparin Unfractionated: 0.23 IU/mL — ABNORMAL LOW (ref 0.30–0.70)

## 2014-10-19 LAB — URINALYSIS, ROUTINE W REFLEX MICROSCOPIC
Bilirubin Urine: NEGATIVE
Glucose, UA: NEGATIVE mg/dL
KETONES UR: NEGATIVE mg/dL
Leukocytes, UA: NEGATIVE
Nitrite: NEGATIVE
PROTEIN: 30 mg/dL — AB
Specific Gravity, Urine: 1.014 (ref 1.005–1.030)
UROBILINOGEN UA: 0.2 mg/dL (ref 0.0–1.0)
pH: 6 (ref 5.0–8.0)

## 2014-10-19 LAB — URINE MICROSCOPIC-ADD ON

## 2014-10-19 LAB — BASIC METABOLIC PANEL
Anion gap: 7 (ref 5–15)
BUN: 9 mg/dL (ref 6–23)
CO2: 27 mmol/L (ref 19–32)
Calcium: 7.9 mg/dL — ABNORMAL LOW (ref 8.4–10.5)
Chloride: 103 mmol/L (ref 96–112)
Creatinine, Ser: 0.68 mg/dL (ref 0.50–1.10)
GFR, EST NON AFRICAN AMERICAN: 85 mL/min — AB (ref >90)
Glucose, Bld: 121 mg/dL — ABNORMAL HIGH (ref 70–99)
Potassium: 3.6 mmol/L (ref 3.5–5.1)
SODIUM: 137 mmol/L (ref 135–145)

## 2014-10-19 LAB — FIBRINOGEN
Fibrinogen: 82 mg/dL — CL (ref 204–475)
Fibrinogen: 151 mg/dL — ABNORMAL LOW (ref 204–475)

## 2014-10-19 LAB — LACTATE DEHYDROGENASE: LDH: 872 U/L — ABNORMAL HIGH (ref 94–250)

## 2014-10-19 MED ORDER — SODIUM CHLORIDE 0.9 % IV SOLN
250.0000 mL | INTRAVENOUS | Status: DC | PRN
  Administered 2014-10-19: 1000 mL via INTRAVENOUS

## 2014-10-19 NOTE — Progress Notes (Signed)
10/18/14 2347 called Elink to notify MD that patients urine appeared dark brownish red. Heparin gtt at 12 ml/hr. Heparin level 0.10. No other signs of bleeding.  0009 called Elink with critical lab value for fibrinogen of 82. Dr. Oletta Darter states he will speak with pharmacy regarding fibrinogen and heparin gtt.   0019 spoke with Pharmacist. Will increase heparin gtt to 13 ml/hr.   0036 spoke with Dr. Jacqualyn Posey from IR. Orders to d/c fibrinogen labs. States he will speak with CCM regarding increasing IV fluids for hematuria.  [place] with Dr. Oletta Darter. Orders given to increase NS fluids to 125/hr and draw lab work.

## 2014-10-19 NOTE — Progress Notes (Signed)
[facility] Progress Note Patient Name: [name] DOB: 12-05-1940 MRN: [id number]   Date of Service  10/19/2014  HPI/Events of Note  Concern that dark urine may be d/t intravascular clot lysis.   eICU Interventions  Will order: 1. LDH. 2. Serum Haptoglobin. 3. Serum free Hemoglobin. 4. Urine hemosiderin.  5. Will increase 0.9 NaCl IV fluid to 125 mL/hour.     Intervention Category Minor Interventions: Clinical assessment - ordering diagnostic tests  Lysle Dingwall 10/19/2014, 12:58 AM

## 2014-10-19 NOTE — Progress Notes (Addendum)
CRITICAL VALUE ALERT  Critical value received:  Fibrinogen 82  Date of notification:  10/19/14  Time of notification:  0019  Critical value read back:Yes.    Nurse who received alert:  Tawni Carnes  MD notified (1st page):  Sommer  Time of first page:  0009  MD notified (2nd page):  Time of second page:  Responding MD:  Oletta Darter  Time MD responded: [phone]

## 2014-10-19 NOTE — Progress Notes (Signed)
Subjective: Patient c/o pain and swelling at LUE PICC site. She states her LE swelling has improved. Per RN hematuria slightly improving.   Allergies: Review of patient's allergies indicates no known allergies.  Medications: Prior to Admission medications   Medication Sig Start Date End Date Taking? Authorizing Provider  Ascorbic Acid (VITAMIN C) 100 MG tablet Take 100 mg by mouth daily.    Yes Historical Provider, MD  BIOTIN PO Take 1 tablet by mouth every morning.   Yes Historical Provider, MD  CALCIUM PO Take 1 capsule by mouth 2 (two) times daily. 1500mg  twice a day, chews.   Yes Historical Provider, MD  Cholecalciferol (VITAMIN D3) 5000 UNITS TABS Take 5,000 Units by mouth daily.   Yes Historical Provider, MD  famotidine (PEPCID) 10 MG tablet Take 10 mg by mouth daily.   Yes Historical Provider, MD  iron polysaccharides (NIFEREX) 150 MG capsule Take 150 mg by mouth daily.    Yes Historical Provider, MD  lisinopril (PRINIVIL,ZESTRIL) 20 MG tablet Take 20 mg by mouth daily.   Yes Historical Provider, MD  Multiple Vitamin (MULTIVITAMIN) tablet Take 1 tablet by mouth daily. gummies   Yes Historical Provider, MD  non-metallic deodorant Jethro Poling) MISC Apply 1 application topically daily.    Yes Historical Provider, MD  Omega-3 Fatty Acids (FISH OIL) 1000 MG CAPS Take 1 capsule by mouth 2 (two) times daily.   Yes Historical Provider, MD  Probiotic Product ([place]) Take 1 capsule by mouth daily.   Yes Historical Provider, MD  vitamin E 100 UNIT capsule Take 100 Units by mouth daily.   Yes Historical Provider, MD  amLODipine (NORVASC) 5 MG tablet Take 1 tablet (5 mg total) by mouth daily. 10/12/14   Ripudeep Krystal Eaton, MD  promethazine (PHENERGAN) 12.5 MG tablet Take 1 tablet (12.5 mg total) by mouth every 6 (six) hours as needed for nausea or vomiting. 10/12/14   Ripudeep Krystal Eaton, MD  ranitidine (ZANTAC) 150 MG tablet Take 1 tablet (150 mg total) by mouth at bedtime. 07/05/13   Lafayette Dragon, MD   Vital Signs: BP 93/65 mmHg  Pulse 96  Temp(Src) 98.8 F (37.1 C) (Oral)  Resp 17  Ht 5\' 5"  (1.651 m)  Wt 222 lb 10.6 oz (101 kg)  BMI 37.05 kg/m2  SpO2 98%  Physical Exam General: A&Ox3, NAD Heart: RRR without M/G/R Lungs: CTA b/l Abd: Soft, NT, ND Ext: LUE PICC with hematoma and edema, B/L LE edema 3+ pitting edema, popliteal access site dressings C/D/I, soft, minimal tenderness no signs of bleeding/hematoma at site, DP intact 2+ b/l  Imaging: Ir Veno/ext/bi  10/17/2014   CLINICAL DATA:  74 year old with bilateral lower extremity DVT and iliocaval thrombus. The patient has an IVC filter in place. Patient has bilateral lower extremity edema.  EXAM: BILATERAL LOWER EXTREMITY VENOGRAPHY ; IVC VENOGRAM; PLACEMENT OF BILATERAL INFUSION CATHETERS AND INITIATION OF CATHETER DIRECTED THROMBOLYTIC THERAPY; ULTRASOUND GUIDANCE FOR VENOUS ACCESS IN THE RIGHT LEG; ULTRASOUND GUIDANCE FOR VENOUS ACCESS IN THE LEFT LEG  Physician: Stephan Minister. Henn, MD  FLUOROSCOPY TIME:  8 minutes and 42 seconds, 273.9 mGy  MEDICATIONS: 2 mg Versed, 75 mcg fentanyl. A radiology nurse monitored the patient for moderate sedation.  ANESTHESIA/SEDATION: Moderate sedation time: 45 minutes  PROCEDURE: The procedure was explained to the patient. The risks and benefits of the procedure were discussed and the patient's questions were addressed. Informed consent was obtained from the patient.  Patient was placed prone on the  interventional table. Both popliteal regions were evaluated with ultrasound. The popliteal regions were prepped and draped in sterile fashion. Maximal barrier sterile technique was utilized including caps, mask, sterile gowns, sterile gloves, sterile drape, hand hygiene and skin antiseptic. Skin was anesthetized with 1% lidocaine. Using ultrasound guidance, a 21 gauge needle was directed into an enlarged and occluded proximal tibial vein near the popliteal vein. Wire was advanced into the deep venous  system. Microfracture catheter was placed. A 6 French vascular sheath was placed. Using ultrasound guidance, a 21 gauge needle was directed into an enlarged superficial and thrombosed vein in the left calf. This may represent the left short saphenous vein. Wire was advanced into the popliteal vein and a micropuncture dilator set was placed. A 6 French vascular sheath was placed.  A 5 French, 100 cm catheter was used to perform left lower extremity venogram and IVC venogram in different segments. Catheter was advanced up the left leg and left pelvis with a Glidewire. The Glidewire would not easily advanced through the filter and filter thrombus. Stiff Amplatz wire was easily advanced through filter and a catheter was advanced above the filter. IVC venogram confirmed patency of the IVC above the filter. A 90 cm infusion catheter with 50 cm of infusion length was easily advanced over the Amplatz wire. The catheter tip was placed proximal to the IVC filter hook. In a similar fashion, the 5 [place] catheter was advanced up the right leg. Venography was performed in different segments. Catheter easily advanced up the right leg with a Glidewire and advanced into the IVC with an Amplatz wire. A second 90 cm infusion catheter with 50 cm of infusion length was advanced over the Amplatz wire and the tip was positioned proximal to the IVC filter. The wires were placed in both infusion catheters. Catheters were secured to the back of the legs.  Catheter directed thrombolytic therapy was started through each infusion catheter. Tenecteplase at 0.25 mg/hour will be infuse through each catheter for total of 0.5 mg/hour. Patient was transferred to an intensive care unit bed.  FINDINGS: Ultrasound demonstrated thrombus in the common femoral veins bilaterally. There is also thrombus in the popliteal veins bilaterally.  Right lower extremity: Thrombus in an enlarged tibial vein was accessed with ultrasound. Thrombus throughout the right  femoral vein and right iliac veins. Thrombus within the infrarenal IVC including the filter. The IVC above the filter is patent.  Left lower extremity: Enlarged superficial vein in the left popliteal region was accessed. This enlarged vessel could represent the short saphenous vein. Thrombus identified in the left femoral vein, left iliac veins and distal IVC.  The infusion catheters were positioned just above the IVC filter. The distal infusion markers are in the mid/distal femoral veins bilaterally.  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 Thrombus extends into the IVC filter. The IVC above the filter is patent.  Placement of bilateral lower extremity infusion catheters from a popliteal approach. Infusion catheters extend into the IVC. Initiation of catheter directed thrombolytic therapy through both infusion catheters.  PLAN: Plan for catheter directed thrombolysis for approximately 24 hours. Patient will return to Interventional Radiology on 10/18/2014 for follow-up venography.   Electronically Signed   By: Markus Daft M.D.   On: 10/17/2014 18:15   Ir Mariana Arn Ivc  10/17/2014   CLINICAL DATA:  74 year old with bilateral lower extremity DVT and iliocaval thrombus. The patient has an IVC filter  in place. Patient has bilateral lower extremity edema.  EXAM: BILATERAL LOWER EXTREMITY VENOGRAPHY ; IVC VENOGRAM; PLACEMENT OF BILATERAL INFUSION CATHETERS AND INITIATION OF CATHETER DIRECTED THROMBOLYTIC THERAPY; ULTRASOUND GUIDANCE FOR VENOUS ACCESS IN THE RIGHT LEG; ULTRASOUND GUIDANCE FOR VENOUS ACCESS IN THE LEFT LEG  Physician: Stephan Minister. Henn, MD  FLUOROSCOPY TIME:  8 minutes and 42 seconds, 273.9 mGy  MEDICATIONS: 2 mg Versed, 75 mcg fentanyl. A radiology nurse monitored the patient for moderate sedation.  ANESTHESIA/SEDATION: Moderate sedation time: 45 minutes  PROCEDURE: The procedure was explained to the  patient. The risks and benefits of the procedure were discussed and the patient's questions were addressed. Informed consent was obtained from the patient.  Patient was placed prone on the interventional table. Both popliteal regions were evaluated with ultrasound. The popliteal regions were prepped and draped in sterile fashion. Maximal barrier sterile technique was utilized including caps, mask, sterile gowns, sterile gloves, sterile drape, hand hygiene and skin antiseptic. Skin was anesthetized with 1% lidocaine. Using ultrasound guidance, a 21 gauge needle was directed into an enlarged and occluded proximal tibial vein near the popliteal vein. Wire was advanced into the deep venous system. Microfracture catheter was placed. A 6 French vascular sheath was placed. Using ultrasound guidance, a 21 gauge needle was directed into an enlarged superficial and thrombosed vein in the left calf. This may represent the left short saphenous vein. Wire was advanced into the popliteal vein and a micropuncture dilator set was placed. A 6 French vascular sheath was placed.  A 5 French, 100 cm catheter was used to perform left lower extremity venogram and IVC venogram in different segments. Catheter was advanced up the left leg and left pelvis with a Glidewire. The Glidewire would not easily advanced through the filter and filter thrombus. Stiff Amplatz wire was easily advanced through filter and a catheter was advanced above the filter. IVC venogram confirmed patency of the IVC above the filter. A 90 cm infusion catheter with 50 cm of infusion length was easily advanced over the Amplatz wire. The catheter tip was placed proximal to the IVC filter hook. In a similar fashion, the 5 [place] catheter was advanced up the right leg. Venography was performed in different segments. Catheter easily advanced up the right leg with a Glidewire and advanced into the IVC with an Amplatz wire. A second 90 cm infusion catheter with 50 cm of  infusion length was advanced over the Amplatz wire and the tip was positioned proximal to the IVC filter. The wires were placed in both infusion catheters. Catheters were secured to the back of the legs.  Catheter directed thrombolytic therapy was started through each infusion catheter. Tenecteplase at 0.25 mg/hour will be infuse through each catheter for total of 0.5 mg/hour. Patient was transferred to an intensive care unit bed.  FINDINGS: Ultrasound demonstrated thrombus in the common femoral veins bilaterally. There is also thrombus in the popliteal veins bilaterally.  Right lower extremity: Thrombus in an enlarged tibial vein was accessed with ultrasound. Thrombus throughout the right femoral vein and right iliac veins. Thrombus within the infrarenal IVC including the filter. The IVC above the filter is patent.  Left lower extremity: Enlarged superficial vein in the left popliteal region was accessed. This enlarged vessel could represent the short saphenous vein. Thrombus identified in the left femoral vein, left iliac veins and distal IVC.  The infusion catheters were positioned just above the IVC filter. The distal infusion markers are in the mid/distal femoral veins bilaterally.  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 Thrombus extends into the IVC filter. The IVC above the filter is patent.  Placement of bilateral lower extremity infusion catheters from a popliteal approach. Infusion catheters extend into the IVC. Initiation of catheter directed thrombolytic therapy through both infusion catheters.  PLAN: Plan for catheter directed thrombolysis for approximately 24 hours. Patient will return to Interventional Radiology on 10/18/2014 for follow-up venography.   Electronically Signed   By: Markus Daft M.D.   On: 10/17/2014 18:15   Ir Pta Venous Left  10/18/2014   CLINICAL DATA:  24 hours venous lytic  follow-up. There is significant bleeding from the left upper extremity PICC site. Fibrinogen levels have been dropping. The lytic and heparin has been stopped.  EXAM: IR THROMB F/U EVAL ART/VEN SUBSEQ DAY (MS); PTA VENOUS  FLUOROSCOPY TIME:  7 minutes and 24 seconds  MEDICATIONS AND MEDICAL HISTORY: Versed 2 mg, Fentanyl 100 mcg.  Additional Medications: None.  ANESTHESIA/SEDATION: Moderate sedation time: 30 minutes  CONTRAST:  100 cc Omnipaque 300  PROCEDURE: The procedure, risks, benefits, and alternatives were explained to the patient. Questions regarding the procedure were encouraged and answered. The patient understands and consents to the procedure.  The bilateral popliteal fossas were prepped with Betadine in a sterile fashion, and a sterile drape was applied covering the operative field. A sterile gown and sterile gloves were used for the procedure.  Six [place] sheaths were exchanged for 7 French sheaths in the popliteal fossas bilaterally after 1% lidocaine infiltration.  Bilateral lower extremity and iliac venography was performed. This demonstrates significant improvement with antegrade flow but significant residual thrombus in the femoral and iliac venous system. The IVC filter and IVC were noted to be widely patent.  Bilateral lower extremities as well as the iliac venous systems were then lysed but the Angiojet for nearly 150 seconds. Repeat venography was performed.  The left common iliac vein was dilated to 12 mm. Repeat venography was performed.  The Angiojet was then administered within the left common iliac vein foreign additional 50 seconds. Final venogram was performed.  Sheaths were removed and hemostasis was achieved with Vpad and direct pressure.  FINDINGS: Initial venography demonstrates wide patency of the IVC. There is significant improvement in the iliac venous system and femoral venous system with some residual thrombus.  After utilizing the Angiojet, the femoral venous system was noted  to be patent bilaterally. The right iliac venous system was patent. There was significant residual thrombus in the left iliac venous system with luminal narrowing  After dilatation to 12 mm, the left common iliac vein was improved  After the final Angiojet administration in the left common iliac vein, final venography demonstrates further improvement but some residual thrombus. Brisk antegrade flow was established bilaterally. The IVC filter remained widely patent.  COMPLICATIONS: None  IMPRESSION: Overall, there has been significant improvement after 24 hours of lysis, Angiojet, and angioplasty. There is some residual thrombus in the left common iliac vein. The patient will be anticoagulated with heparin.   Electronically Signed   By: Marybelle Killings M.D.   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 The patient has an IVC filter in place. Patient has bilateral lower extremity edema.  EXAM: BILATERAL LOWER EXTREMITY VENOGRAPHY ; IVC VENOGRAM; PLACEMENT OF BILATERAL INFUSION CATHETERS AND  INITIATION OF CATHETER DIRECTED THROMBOLYTIC THERAPY; ULTRASOUND GUIDANCE FOR VENOUS ACCESS IN THE RIGHT LEG; ULTRASOUND GUIDANCE FOR VENOUS ACCESS IN THE LEFT LEG  Physician: Stephan Minister. Henn, MD  FLUOROSCOPY TIME:  8 minutes and 42 seconds, 273.9 mGy  MEDICATIONS: 2 mg Versed, 75 mcg fentanyl. A radiology nurse monitored the patient for moderate sedation.  ANESTHESIA/SEDATION: Moderate sedation time: 45 minutes  PROCEDURE: The procedure was explained to the patient. The risks and benefits of the procedure were discussed and the patient's questions were addressed. Informed consent was obtained from the patient.  Patient was placed prone on the interventional table. Both popliteal regions were evaluated with ultrasound. The popliteal regions were prepped and draped in sterile fashion. Maximal barrier sterile technique was  utilized including caps, mask, sterile gowns, sterile gloves, sterile drape, hand hygiene and skin antiseptic. Skin was anesthetized with 1% lidocaine. Using ultrasound guidance, a 21 gauge needle was directed into an enlarged and occluded proximal tibial vein near the popliteal vein. Wire was advanced into the deep venous system. Microfracture catheter was placed. A 6 French vascular sheath was placed. Using ultrasound guidance, a 21 gauge needle was directed into an enlarged superficial and thrombosed vein in the left calf. This may represent the left short saphenous vein. Wire was advanced into the popliteal vein and a micropuncture dilator set was placed. A 6 French vascular sheath was placed.  A 5 French, 100 cm catheter was used to perform left lower extremity venogram and IVC venogram in different segments. Catheter was advanced up the left leg and left pelvis with a Glidewire. The Glidewire would not easily advanced through the filter and filter thrombus. Stiff Amplatz wire was easily advanced through filter and a catheter was advanced above the filter. IVC venogram confirmed patency of the IVC above the filter. A 90 cm infusion catheter with 50 cm of infusion length was easily advanced over the Amplatz wire. The catheter tip was placed proximal to the IVC filter hook. In a similar fashion, the 5 [place] catheter was advanced up the right leg. Venography was performed in different segments. Catheter easily advanced up the right leg with a Glidewire and advanced into the IVC with an Amplatz wire. A second 90 cm infusion catheter with 50 cm of infusion length was advanced over the Amplatz wire and the tip was positioned proximal to the IVC filter. The wires were placed in both infusion catheters. Catheters were secured to the back of the legs.  Catheter directed thrombolytic therapy was started through each infusion catheter. Tenecteplase at 0.25 mg/hour will be infuse through each catheter for total of 0.5  mg/hour. Patient was transferred to an intensive care unit bed.  FINDINGS: Ultrasound demonstrated thrombus in the common femoral veins bilaterally. There is also thrombus in the popliteal veins bilaterally.  Right lower extremity: Thrombus in an enlarged tibial vein was accessed with ultrasound. Thrombus throughout the right femoral vein and right iliac veins. Thrombus within the infrarenal IVC including the filter. The IVC above the filter is patent.  Left lower extremity: Enlarged superficial vein in the left popliteal region was accessed. This enlarged vessel could represent the short saphenous vein. Thrombus identified in the left femoral vein, left iliac veins and distal IVC.  The infusion catheters were positioned just above the IVC filter. The distal infusion markers are in the mid/distal femoral veins bilaterally.  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 Thrombus extends into  the IVC filter. The IVC above the filter is patent.  Placement of bilateral lower extremity infusion catheters from a popliteal approach. Infusion catheters extend into the IVC. Initiation of catheter directed thrombolytic therapy through both infusion catheters.  PLAN: Plan for catheter directed thrombolysis for approximately 24 hours. Patient will return to Interventional Radiology on 10/18/2014 for follow-up venography.   Electronically Signed   By: Markus Daft M.D.   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 The patient has an IVC filter in place. Patient has bilateral lower extremity edema.  EXAM: BILATERAL LOWER EXTREMITY VENOGRAPHY ; IVC VENOGRAM; PLACEMENT OF BILATERAL INFUSION CATHETERS AND INITIATION OF CATHETER DIRECTED THROMBOLYTIC THERAPY; ULTRASOUND GUIDANCE FOR VENOUS ACCESS IN THE RIGHT LEG; ULTRASOUND  GUIDANCE FOR VENOUS ACCESS IN THE LEFT LEG  Physician: Stephan Minister. Henn, MD  FLUOROSCOPY TIME:  8 minutes and 42 seconds, 273.9 mGy  MEDICATIONS: 2 mg Versed, 75 mcg fentanyl. A radiology nurse monitored the patient for moderate sedation.  ANESTHESIA/SEDATION: Moderate sedation time: 45 minutes  PROCEDURE: The procedure was explained to the patient. The risks and benefits of the procedure were discussed and the patient's questions were addressed. Informed consent was obtained from the patient.  Patient was placed prone on the interventional table. Both popliteal regions were evaluated with ultrasound. The popliteal regions were prepped and draped in sterile fashion. Maximal barrier sterile technique was utilized including caps, mask, sterile gowns, sterile gloves, sterile drape, hand hygiene and skin antiseptic. Skin was anesthetized with 1% lidocaine. Using ultrasound guidance, a 21 gauge needle was directed into an enlarged and occluded proximal tibial vein near the popliteal vein. Wire was advanced into the deep venous system. Microfracture catheter was placed. A 6 French vascular sheath was placed. Using ultrasound guidance, a 21 gauge needle was directed into an enlarged superficial and thrombosed vein in the left calf. This may represent the left short saphenous vein. Wire was advanced into the popliteal vein and a micropuncture dilator set was placed. A 6 French vascular sheath was placed.  A 5 French, 100 cm catheter was used to perform left lower extremity venogram and IVC venogram in different segments. Catheter was advanced up the left leg and left pelvis with a Glidewire. The Glidewire would not easily advanced through the filter and filter thrombus. Stiff Amplatz wire was easily advanced through filter and a catheter was advanced above the filter. IVC venogram confirmed patency of the IVC above the filter. A 90 cm infusion catheter with 50 cm of infusion length was easily advanced over the Amplatz wire. The  catheter tip was placed proximal to the IVC filter hook. In a similar fashion, the 5 [place] catheter was advanced up the right leg. Venography was performed in different segments. Catheter easily advanced up the right leg with a Glidewire and advanced into the IVC with an Amplatz wire. A second 90 cm infusion catheter with 50 cm of infusion length was advanced over the Amplatz wire and the tip was positioned proximal to the IVC filter. The wires were placed in both infusion catheters. Catheters were secured to the back of the legs.  Catheter directed thrombolytic therapy was started through each infusion catheter. Tenecteplase at 0.25 mg/hour will be infuse through each catheter for total of 0.5 mg/hour. Patient was transferred to an intensive care unit bed.  FINDINGS: Ultrasound demonstrated thrombus in the common femoral veins bilaterally. There  is also thrombus in the popliteal veins bilaterally.  Right lower extremity: Thrombus in an enlarged tibial vein was accessed with ultrasound. Thrombus throughout the right femoral vein and right iliac veins. Thrombus within the infrarenal IVC including the filter. The IVC above the filter is patent.  Left lower extremity: Enlarged superficial vein in the left popliteal region was accessed. This enlarged vessel could represent the short saphenous vein. Thrombus identified in the left femoral vein, left iliac veins and distal IVC.  The infusion catheters were positioned just above the IVC filter. The distal infusion markers are in the mid/distal femoral veins bilaterally.  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 Thrombus extends into the IVC filter. The IVC above the filter is patent.  Placement of bilateral lower extremity infusion catheters from a popliteal approach. Infusion catheters extend into the IVC. Initiation of catheter directed thrombolytic therapy through  both infusion catheters.  PLAN: Plan for catheter directed thrombolysis for approximately 24 hours. Patient will return to Interventional Radiology on 10/18/2014 for follow-up venography.   Electronically Signed   By: Markus Daft M.D.   On: 10/17/2014 18:15   Ir Infusion Thrombol Venous Initial (ms)  10/17/2014   CLINICAL DATA:  74 year old with bilateral lower extremity DVT and iliocaval thrombus. The patient has an IVC filter in place. Patient has bilateral lower extremity edema.  EXAM: BILATERAL LOWER EXTREMITY VENOGRAPHY ; IVC VENOGRAM; PLACEMENT OF BILATERAL INFUSION CATHETERS AND INITIATION OF CATHETER DIRECTED THROMBOLYTIC THERAPY; ULTRASOUND GUIDANCE FOR VENOUS ACCESS IN THE RIGHT LEG; ULTRASOUND GUIDANCE FOR VENOUS ACCESS IN THE LEFT LEG  Physician: Stephan Minister. Henn, MD  FLUOROSCOPY TIME:  8 minutes and 42 seconds, 273.9 mGy  MEDICATIONS: 2 mg Versed, 75 mcg fentanyl. A radiology nurse monitored the patient for moderate sedation.  ANESTHESIA/SEDATION: Moderate sedation time: 45 minutes  PROCEDURE: The procedure was explained to the patient. The risks and benefits of the procedure were discussed and the patient's questions were addressed. Informed consent was obtained from the patient.  Patient was placed prone on the interventional table. Both popliteal regions were evaluated with ultrasound. The popliteal regions were prepped and draped in sterile fashion. Maximal barrier sterile technique was utilized including caps, mask, sterile gowns, sterile gloves, sterile drape, hand hygiene and skin antiseptic. Skin was anesthetized with 1% lidocaine. Using ultrasound guidance, a 21 gauge needle was directed into an enlarged and occluded proximal tibial vein near the popliteal vein. Wire was advanced into the deep venous system. Microfracture catheter was placed. A 6 French vascular sheath was placed. Using ultrasound guidance, a 21 gauge needle was directed into an enlarged superficial and thrombosed vein in the  left calf. This may represent the left short saphenous vein. Wire was advanced into the popliteal vein and a micropuncture dilator set was placed. A 6 French vascular sheath was placed.  A 5 French, 100 cm catheter was used to perform left lower extremity venogram and IVC venogram in different segments. Catheter was advanced up the left leg and left pelvis with a Glidewire. The Glidewire would not easily advanced through the filter and filter thrombus. Stiff Amplatz wire was easily advanced through filter and a catheter was advanced above the filter. IVC venogram confirmed patency of the IVC above the filter. A 90 cm infusion catheter with 50 cm of infusion length was easily advanced over the Amplatz wire. The catheter tip was placed proximal to the IVC filter hook. In a  similar fashion, the 5 [place] catheter was advanced up the right leg. Venography was performed in different segments. Catheter easily advanced up the right leg with a Glidewire and advanced into the IVC with an Amplatz wire. A second 90 cm infusion catheter with 50 cm of infusion length was advanced over the Amplatz wire and the tip was positioned proximal to the IVC filter. The wires were placed in both infusion catheters. Catheters were secured to the back of the legs.  Catheter directed thrombolytic therapy was started through each infusion catheter. Tenecteplase at 0.25 mg/hour will be infuse through each catheter for total of 0.5 mg/hour. Patient was transferred to an intensive care unit bed.  FINDINGS: Ultrasound demonstrated thrombus in the common femoral veins bilaterally. There is also thrombus in the popliteal veins bilaterally.  Right lower extremity: Thrombus in an enlarged tibial vein was accessed with ultrasound. Thrombus throughout the right femoral vein and right iliac veins. Thrombus within the infrarenal IVC including the filter. The IVC above the filter is patent.  Left lower extremity: Enlarged superficial vein in the left  popliteal region was accessed. This enlarged vessel could represent the short saphenous vein. Thrombus identified in the left femoral vein, left iliac veins and distal IVC.  The infusion catheters were positioned just above the IVC filter. The distal infusion markers are in the mid/distal femoral veins bilaterally.  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 Thrombus extends into the IVC filter. The IVC above the filter is patent.  Placement of bilateral lower extremity infusion catheters from a popliteal approach. Infusion catheters extend into the IVC. Initiation of catheter directed thrombolytic therapy through both infusion catheters.  PLAN: Plan for catheter directed thrombolysis for approximately 24 hours. Patient will return to Interventional Radiology on 10/18/2014 for follow-up venography.   Electronically Signed   By: Markus Daft M.D.   On: 10/17/2014 18:15   Ir Jacolyn Reedy F/u Eval Art/ven Alwyn Ren Day (ms)  10/18/2014   CLINICAL DATA:  24 hours venous lytic follow-up. There is significant bleeding from the left upper extremity PICC site. Fibrinogen levels have been dropping. The lytic and heparin has been stopped.  EXAM: IR THROMB F/U EVAL ART/VEN SUBSEQ DAY (MS); PTA VENOUS  FLUOROSCOPY TIME:  7 minutes and 24 seconds  MEDICATIONS AND MEDICAL HISTORY: Versed 2 mg, Fentanyl 100 mcg.  Additional Medications: None.  ANESTHESIA/SEDATION: Moderate sedation time: 30 minutes  CONTRAST:  100 cc Omnipaque 300  PROCEDURE: The procedure, risks, benefits, and alternatives were explained to the patient. Questions regarding the procedure were encouraged and answered. The patient understands and consents to the procedure.  The bilateral popliteal fossas were prepped with Betadine in a sterile fashion, and a sterile drape was applied covering the operative field. A sterile gown and sterile gloves were used for the  procedure.  Six [place] sheaths were exchanged for 7 French sheaths in the popliteal fossas bilaterally after 1% lidocaine infiltration.  Bilateral lower extremity and iliac venography was performed. This demonstrates significant improvement with antegrade flow but significant residual thrombus in the femoral and iliac venous system. The IVC filter and IVC were noted to be widely patent.  Bilateral lower extremities as well as the iliac venous systems were then lysed but the Angiojet for nearly 150 seconds. Repeat venography was performed.  The left common iliac vein was dilated to 12 mm. Repeat venography was performed.  The Angiojet was then administered within the left  common iliac vein foreign additional 50 seconds. Final venogram was performed.  Sheaths were removed and hemostasis was achieved with Vpad and direct pressure.  FINDINGS: Initial venography demonstrates wide patency of the IVC. There is significant improvement in the iliac venous system and femoral venous system with some residual thrombus.  After utilizing the Angiojet, the femoral venous system was noted to be patent bilaterally. The right iliac venous system was patent. There was significant residual thrombus in the left iliac venous system with luminal narrowing  After dilatation to 12 mm, the left common iliac vein was improved  After the final Angiojet administration in the left common iliac vein, final venography demonstrates further improvement but some residual thrombus. Brisk antegrade flow was established bilaterally. The IVC filter remained widely patent.  COMPLICATIONS: None  IMPRESSION: Overall, there has been significant improvement after 24 hours of lysis, Angiojet, and angioplasty. There is some residual thrombus in the left common iliac vein. The patient will be anticoagulated with heparin.   Electronically Signed   By: Marybelle Killings M.D.   On: 10/18/2014 17:16    Labs:  CBC:  Recent Labs  10/17/14 1950 10/18/14 0244  10/18/14 0929 10/19/14 0438  WBC 12.2* 12.3* 12.7* 10.5  HGB 9.1* 9.2* 9.0* 7.1*  HCT 30.3* 29.6* 29.0* 23.4*  PLT 234 181 152 102*    COAGS:  Recent Labs  09/27/14 1457 10/02/14 0422 10/10/14 2052 10/11/14 1055 10/17/14 0402  INR 1.08 1.13 1.09  --  1.05  APTT 28 29  --  33 37    BMP:  Recent Labs  10/16/14 0840 10/17/14 0402 10/18/14 0244 10/19/14 0438  NA 135 137 135 137  K 3.8 4.0 4.0 3.6  CL 100 100 100 103  CO2 24 27 27 27   GLUCOSE 119* 115* 140* 121*  BUN 10 9 10 9   CALCIUM 8.7 8.7 8.5 7.9*  CREATININE 0.72 0.63 0.78 0.68  GFRNONAA 84* 87* 82* 85*  GFRAA >90 >90 >90 >90    LIVER FUNCTION TESTS:  Recent Labs  09/27/14 1046 10/10/14 2052 10/11/14 1055 10/17/14 0402  BILITOT 0.5 0.6 0.6 0.7  AST 34 30 23 42*  ALT 23 19 15  47*  ALKPHOS 61 68 55 97  PROT 5.6* 6.8 5.5* 5.4*  ALBUMIN 3.0* 3.8 3.1* 2.4*    Assessment and Plan: 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

## 2014-10-19 NOTE — Progress Notes (Signed)
[place] Progress Note Patient Name: [name] DOB: 03/20/1941 MRN: [id number]   Date of Service  10/19/2014  HPI/Events of Note  UA - positive heme and negative RBC c/w hemoglobin breakdown products. Creatinine = 0.68 and urine output is good.   eICU Interventions  Continue present management.      Intervention Category Intermediate Interventions: Diagnostic test evaluation  [name] 10/19/2014, 6:16 AM

## 2014-10-19 NOTE — Progress Notes (Addendum)
I see that [name] has already been seen by hematology. She has been seen recently by Dr. Beryle Beams. As such, I would have to defer to his expertise regarding her situation. Her main oncologist is Dr. Lindi Adie. I think he clearly needs to know that she is in the hospital. He is managing her breast cancer.  I am a little puzzled as to why she has issues with the hematoma formation. I still have to believe that this might be a residual from her thrombolytic therapy.  I cannot imagine that she has any kind of underlying hypocoagulable situation.  I'll not sure if the tamoxifen might have triggered the thrombo-embolic disease. She is off this now. I would probably recommend one of the aromatase inhibitors despite her osteopenia. She can always be placed on Reclast or Prolia to try to help with osteopenia or osteoporosis protection.  I do not see any role for put her through extensive and expensive blood studies.  It is possible she just may have weak blood vessel walls. I don't see anything that would suggest Ehlers-Danlos syndrome as she has had surgeries in the past without any bleeding issues. Sometimes, vitamin C can help strengthen blood vessels to help minimize bleeding.  She clearly needs long-term anticoagulation. I agree with Dr. Beryle Beams about getting her on warfarin and then may be transitioning her over to one of the new oral anticoagulants.  Again, I would have to believe that the bleeding is reflective of the thrombolytic therapy that she had.  Again, I do not want to overstep my boundaries as this is Dr. Azucena Freed consultation but ultimately is Dr. Geralyn Flash oncology patient. She likes him and certainly would like to make sure that he is involved with her inpatient care.  I think that the ICU staff and critical care doctors at doing a fantastic job with her. This is a very difficult situation and I would think that as time goes on, that the bleeding risk will minimize and she  can be safely anticoagulated.  Holly E.

## 2014-10-19 NOTE — Progress Notes (Signed)
ANTICOAGULATION CONSULT NOTE - Follow Up Consult  Pharmacy Consult for Heparin  Indication: pulmonary embolus and DVT  No Known Allergies  Patient Measurements: Height: 5\' 5"  (165.1 cm) Weight: 222 lb 10.6 oz (101 kg) IBW/kg (Calculated) : 57  Vital Signs: Temp: 98.6 F (37 C) (04/29 2000) Temp Source: Oral (04/29 2000) BP: 113/50 mmHg (04/30 0000) Pulse Rate: 94 (04/30 0000)  Labs:  Recent Labs  10/16/14 0840  10/17/14 0402  10/17/14 1950 10/18/14 0244 10/18/14 0605 10/18/14 0929 10/18/14 2108  HGB 9.1*  --  9.3*  --  9.1* 9.2*  --  9.0*  --   HCT 29.3*  --  30.4*  --  30.3* 29.6*  --  29.0*  --   PLT 209  --  258  --  234 181  --  152  --   APTT  --   --  37  --   --   --   --   --   --   LABPROT  --   --  13.9  --   --   --   --   --   --   INR  --   --  1.05  --   --   --   --   --   --   HEPARINUNFRC 1.00*  < > 0.20*  < > 0.10*  --  0.29*  --  <0.10*  CREATININE 0.72  --  0.63  --   --  0.78  --   --   --   < > = values in this interval not displayed.  Estimated Creatinine Clearance: 74.9 mL/min (by C-G formula based on Cr of 0.78).  Assessment: Sub-therapeutic heparin level after re-start (held earlier due to PICC site bleeding). TNKase is OFF (since ~1500 today). Pt with some mild blood tinged urine. Discussed with [employer] MD, ok to continue heparin for now.   Goal of Therapy:  Heparin level 0.2-0.5 units/ml, first 24 hours after TNKase Monitor platelets by anticoagulation protocol: Yes   Plan:  -Increase heparin cautiously to 1300 units/hr with recent bleeding -0800 HL -Daily CBC/HL -Monitor for bleeding -RN will call with any further bleeding  Narda Bonds 10/19/2014,12:11 AM

## 2014-10-19 NOTE — Progress Notes (Addendum)
ANTICOAGULATION CONSULT NOTE  Pharmacy Consult for Heparin Indication: pulmonary embolus and DVT  No Known Allergies  Patient Measurements: Height: 5\' 5"  (165.1 cm) Weight: 222 lb 10.6 oz (101 kg) IBW/kg (Calculated) : 57 Heparin Dosing Weight: 80 kg  Vital Signs: Temp: 98.3 F (36.8 C) (04/30 0800) Temp Source: Oral (04/30 0800) BP: 93/65 mmHg (04/30 0900) Pulse Rate: 96 (04/30 0900)  Labs:  Recent Labs  10/17/14 0402  10/18/14 0244 10/18/14 0605 10/18/14 0929 10/18/14 2108 10/19/14 0438 10/19/14 0800  HGB 9.3*  < > 9.2*  --  9.0*  --  7.1*  --   HCT 30.4*  < > 29.6*  --  29.0*  --  23.4*  --   PLT 258  < > 181  --  152  --  102*  --   APTT 37  --   --   --   --   --   --   --   LABPROT 13.9  --   --   --   --   --   --   --   INR 1.05  --   --   --   --   --   --   --   HEPARINUNFRC 0.20*  < >  --  0.29*  --  <0.10*  --  0.12*  CREATININE 0.63  --  0.78  --   --   --  0.68  --   < > = values in this interval not displayed.  Estimated Creatinine Clearance: 74.9 mL/min (by C-G formula based on Cr of 0.68).   Assessment: 79 YOF with h/o of bilateral PE, new bilateral distal DVTs on IV heparin. Pt has IVC filter and has a history of periorbital ecchymosis on Xarelto - this was stopped PTA. Now s/p IR for TNKase lysis - completed ~1500 on 4/29. Heparin was resumed yesterday then was held for 6 hours due to bleeding from PICC site. Pt had hematuria overnight - began NS at 125. Heparin level is still very SUBtherapeutic. Also had drop in hgb, plts -- all cell lines, possibly dilutional.  Making conservative increase in heparin given recent bleeding and discussion with CCM.    Goal of Therapy:  Heparin level 0.2-0.5 units/ml, for first 24 hours after TNKase Monitor platelets by anticoagulation protocol: Yes   Plan:  -Increase heparin to 1400 units/hr -Daily HL, CBC -Monitor hematuria, hgb, other s/sx bleeding -Will need confirmatory level this afternoon  -F/u heme  consult    Hughes Better, PharmD, BCPS Clinical Pharmacist Pager: [phone] 10/19/2014 9:45 AM    -Evening heparin level is SUBtherapeutic. -Increase heparin gtt to 1500 units/hr -Recheck level with AM labs  Harvel Quale 10/19/2014

## 2014-10-19 NOTE — Progress Notes (Signed)
Name: [name] MRN: [id number] DOB: 1941/03/04    ADMISSION DATE:  10/10/2014 CONSULTATION DATE:  10/14/2014  REFERRING MD :  Dr. Tana Coast  CHIEF COMPLAINT:  syncope  BRIEF PATIENT DESCRIPTION:  74 year old female with PMH as below. She was admitted to [facility] ED 09/27/14 with bilateral PE and received thrombolysis via EKOS on 4/9. Heprain gtt was eventually transitioned to xarelto, however, she developed periorbital ecchymosis. Xarelto was D/c'd and IVF filter was placed. She was discharged to home 4/14. 4/21 she again presented to [facility] c/o orthostasis and syncope. She was admitted and treated initially with IVF, which resulted in massive lower extremity edema. Echocardiogram has normalized since prior admission. Based on relatively normal heart and kidney function there is concern that her IVC filter may be obstructing. PCCM asked to consult.   SIGNIFICANT EVENTS  4/09>>>EKOS 4/10>> 1uRBC transfusion 4/12 increased periorbital ecchymosis 4/13 IVC filter placed 4/21 admitted for syncope  STUDIES:  4/08 CT chest >> Moderate burden of bilateral pulmonary emboli. Positive for acute PE with CT evidence of right heart strain (RV/LV Ratio = 1.9).  4/08 Echo >> EF 75%, mod RV dilation with decreased systolic fx, PAS 61 mmHg, mod/severe TR 4/09 LE dopplers >>> acute DVT in rt popliteal vein, rt TB vein, and rt peroneal vein 4/21 CT head > No acute abnormalities 4/22 Renal US > No hydronephrosis. No renal calculi. Bilateral mild renal cortical thinning probable due to atrophy. 4/22 Echo > LVEF 60-65%, Grade 1 DD.  Improvement from prior study. 4/23 MRI brain > No acute intracranial abnormality. 10/15/2014: Diagnosis of Acute extensive bilateral iliofemoral  DVT on duplex. CT abdomen  shows distal to IVC - dvt likely. She is in bed. Periorbital echymmoses nearly resolved from prior admit. Denies bleeding here or at home. No visual issues. No neuro deficits. START IV Heparin 10/16/14: IR /heme/pccm join  consult - consensus to go ahead with IR guided local thrombolysis of DVT (c/w LE Swelling 2X normal size and unable to weweight bear or bend knee). Heme consult - risk factors of Tamoxifen and cancer identified  SUBJECTIVE/OVERNIGHT/INTERVAL HX Bleeding around the left PICC line with associated pain, neuro intact.  VITAL SIGNS: Temp:  [98.1 F (36.7 C)-98.8 F (37.1 C)] 98.8 F (37.1 C) (04/30 1100) Pulse Rate:  [83-108] 96 (04/30 0900) Resp:  [14-31] 17 (04/30 0900) BP: (93-152)/(45-92) 93/65 mmHg (04/30 0900) SpO2:  [92 %-100 %] 98 % (04/30 0900)  PHYSICAL EXAMINATION: General:  74 year old female in NAD Neuro:  Alert, oriented, non-focal HEENT:  [place]/AT, No JVD noted, PERRL Cardiovascular:  RRR, no MRG Lungs:  Clear bilateral breath sounds. Somewhat diminished R Abdomen:  Soft, non-tender, non-distended Musculoskeletal:  BLE edema and pain R > L  SCDs +, SQ hematoma around the PICC sight in the left arm, not warm but tender to palpation.  Neurovascularly intact however. Skin:  Grossly intact  PULMONARY No results for input(s): PHART, PCO2ART, PO2ART, HCO3, TCO2, O2SAT in the last 168 hours.  Invalid input(s): PCO2, PO2  CBC  Recent Labs Lab 10/18/14 0244 10/18/14 0929 10/19/14 0438  HGB 9.2* 9.0* 7.1*  HCT 29.6* 29.0* 23.4*  WBC 12.3* 12.7* 10.5  PLT 181 152 102*   COAGULATION  Recent Labs Lab 10/17/14 0402  INR 1.05   CARDIAC    Recent Labs Lab 10/12/14 1232 10/12/14 1645 10/12/14 2330  TROPONINI <0.03 <0.03 <0.03   No results for input(s): PROBNP in the last 168 hours.   CHEMISTRY  Recent  Labs Lab 10/15/14 2231 10/16/14 0840 10/17/14 0402 10/18/14 0244 10/19/14 0438  NA 130* 135 137 135 137  K 4.2 3.8 4.0 4.0 3.6  CL 97 100 100 100 103  CO2 22 24 27 27 27   GLUCOSE 146* 119* 115* 140* 121*  BUN 11 10 9 10 9   CREATININE 0.79 0.72 0.63 0.78 0.68  CALCIUM 8.5 8.7 8.7 8.5 7.9*  MG  --   --   --  2.2  --   PHOS  --   --   --  3.0  --     Estimated Creatinine Clearance: 74.9 mL/min (by C-G formula based on Cr of 0.68).   LIVER  Recent Labs Lab 10/17/14 0402  AST 42*  ALT 47*  ALKPHOS 97  BILITOT 0.7  PROT 5.4*  ALBUMIN 2.4*  INR 1.05   INFECTIOUS No results for input(s): LATICACIDVEN, PROCALCITON in the last 168 hours.   ENDOCRINE CBG (last 3)  No results for input(s): GLUCAP in the last 72 hours.       IMAGING [phone] - images reviewed personally if listed below Ir Veno/ext/bi  10/17/2014   CLINICAL DATA:  74 year old with bilateral lower extremity DVT and iliocaval thrombus. The patient has an IVC filter in place. Patient has bilateral lower extremity edema.  EXAM: BILATERAL LOWER EXTREMITY VENOGRAPHY ; IVC VENOGRAM; PLACEMENT OF BILATERAL INFUSION CATHETERS AND INITIATION OF CATHETER DIRECTED THROMBOLYTIC THERAPY; ULTRASOUND GUIDANCE FOR VENOUS ACCESS IN THE RIGHT LEG; ULTRASOUND GUIDANCE FOR VENOUS ACCESS IN THE LEFT LEG  Physician: Stephan Minister. Henn, MD  FLUOROSCOPY TIME:  8 minutes and 42 seconds, 273.9 mGy  MEDICATIONS: 2 mg Versed, 75 mcg fentanyl. A radiology nurse monitored the patient for moderate sedation.  ANESTHESIA/SEDATION: Moderate sedation time: 45 minutes  PROCEDURE: The procedure was explained to the patient. The risks and benefits of the procedure were discussed and the patient's questions were addressed. Informed consent was obtained from the patient.  Patient was placed prone on the interventional table. Both popliteal regions were evaluated with ultrasound. The popliteal regions were prepped and draped in sterile fashion. Maximal barrier sterile technique was utilized including caps, mask, sterile gowns, sterile gloves, sterile drape, hand hygiene and skin antiseptic. Skin was anesthetized with 1% lidocaine. Using ultrasound guidance, a 21 gauge needle was directed into an enlarged and occluded proximal tibial vein near the popliteal vein. Wire was advanced into the deep venous system. Microfracture  catheter was placed. A 6 French vascular sheath was placed. Using ultrasound guidance, a 21 gauge needle was directed into an enlarged superficial and thrombosed vein in the left calf. This may represent the left short saphenous vein. Wire was advanced into the popliteal vein and a micropuncture dilator set was placed. A 6 French vascular sheath was placed.  A 5 French, 100 cm catheter was used to perform left lower extremity venogram and IVC venogram in different segments. Catheter was advanced up the left leg and left pelvis with a Glidewire. The Glidewire would not easily advanced through the filter and filter thrombus. Stiff Amplatz wire was easily advanced through filter and a catheter was advanced above the filter. IVC venogram confirmed patency of the IVC above the filter. A 90 cm infusion catheter with 50 cm of infusion length was easily advanced over the Amplatz wire. The catheter tip was placed proximal to the IVC filter hook. In a similar fashion, the 5 [place] catheter was advanced up the right leg. Venography was performed in different segments. Catheter easily advanced up  the right leg with a Glidewire and advanced into the IVC with an Amplatz wire. A second 90 cm infusion catheter with 50 cm of infusion length was advanced over the Amplatz wire and the tip was positioned proximal to the IVC filter. The wires were placed in both infusion catheters. Catheters were secured to the back of the legs.  Catheter directed thrombolytic therapy was started through each infusion catheter. Tenecteplase at 0.25 mg/hour will be infuse through each catheter for total of 0.5 mg/hour. Patient was transferred to an intensive care unit bed.  FINDINGS: Ultrasound demonstrated thrombus in the common femoral veins bilaterally. There is also thrombus in the popliteal veins bilaterally.  Right lower extremity: Thrombus in an enlarged tibial vein was accessed with ultrasound. Thrombus throughout the right femoral vein and right  iliac veins. Thrombus within the infrarenal IVC including the filter. The IVC above the filter is patent.  Left lower extremity: Enlarged superficial vein in the left popliteal region was accessed. This enlarged vessel could represent the short saphenous vein. Thrombus identified in the left femoral vein, left iliac veins and distal IVC.  The infusion catheters were positioned just above the IVC filter. The distal infusion markers are in the mid/distal femoral veins bilaterally.  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 Thrombus extends into the IVC filter. The IVC above the filter is patent.  Placement of bilateral lower extremity infusion catheters from a popliteal approach. Infusion catheters extend into the IVC. Initiation of catheter directed thrombolytic therapy through both infusion catheters.  PLAN: Plan for catheter directed thrombolysis for approximately 24 hours. Patient will return to Interventional Radiology on 10/18/2014 for follow-up venography.   Electronically Signed   By: Markus Daft M.D.   On: 10/17/2014 18:15   Ir Mariana Arn Ivc  10/17/2014   CLINICAL DATA:  74 year old with bilateral lower extremity DVT and iliocaval thrombus. The patient has an IVC filter in place. Patient has bilateral lower extremity edema.  EXAM: BILATERAL LOWER EXTREMITY VENOGRAPHY ; IVC VENOGRAM; PLACEMENT OF BILATERAL INFUSION CATHETERS AND INITIATION OF CATHETER DIRECTED THROMBOLYTIC THERAPY; ULTRASOUND GUIDANCE FOR VENOUS ACCESS IN THE RIGHT LEG; ULTRASOUND GUIDANCE FOR VENOUS ACCESS IN THE LEFT LEG  Physician: Stephan Minister. Henn, MD  FLUOROSCOPY TIME:  8 minutes and 42 seconds, 273.9 mGy  MEDICATIONS: 2 mg Versed, 75 mcg fentanyl. A radiology nurse monitored the patient for moderate sedation.  ANESTHESIA/SEDATION: Moderate sedation time: 45 minutes  PROCEDURE: The procedure was explained to the patient. The risks and  benefits of the procedure were discussed and the patient's questions were addressed. Informed consent was obtained from the patient.  Patient was placed prone on the interventional table. Both popliteal regions were evaluated with ultrasound. The popliteal regions were prepped and draped in sterile fashion. Maximal barrier sterile technique was utilized including caps, mask, sterile gowns, sterile gloves, sterile drape, hand hygiene and skin antiseptic. Skin was anesthetized with 1% lidocaine. Using ultrasound guidance, a 21 gauge needle was directed into an enlarged and occluded proximal tibial vein near the popliteal vein. Wire was advanced into the deep venous system. Microfracture catheter was placed. A 6 French vascular sheath was placed. Using ultrasound guidance, a 21 gauge needle was directed into an enlarged superficial and thrombosed vein in the left calf. This may represent the left short saphenous vein. Wire was advanced into the popliteal vein and a micropuncture dilator set was placed. A 6 French vascular sheath was  placed.  A 5 French, 100 cm catheter was used to perform left lower extremity venogram and IVC venogram in different segments. Catheter was advanced up the left leg and left pelvis with a Glidewire. The Glidewire would not easily advanced through the filter and filter thrombus. Stiff Amplatz wire was easily advanced through filter and a catheter was advanced above the filter. IVC venogram confirmed patency of the IVC above the filter. A 90 cm infusion catheter with 50 cm of infusion length was easily advanced over the Amplatz wire. The catheter tip was placed proximal to the IVC filter hook. In a similar fashion, the 5 [place] catheter was advanced up the right leg. Venography was performed in different segments. Catheter easily advanced up the right leg with a Glidewire and advanced into the IVC with an Amplatz wire. A second 90 cm infusion catheter with 50 cm of infusion length was  advanced over the Amplatz wire and the tip was positioned proximal to the IVC filter. The wires were placed in both infusion catheters. Catheters were secured to the back of the legs.  Catheter directed thrombolytic therapy was started through each infusion catheter. Tenecteplase at 0.25 mg/hour will be infuse through each catheter for total of 0.5 mg/hour. Patient was transferred to an intensive care unit bed.  FINDINGS: Ultrasound demonstrated thrombus in the common femoral veins bilaterally. There is also thrombus in the popliteal veins bilaterally.  Right lower extremity: Thrombus in an enlarged tibial vein was accessed with ultrasound. Thrombus throughout the right femoral vein and right iliac veins. Thrombus within the infrarenal IVC including the filter. The IVC above the filter is patent.  Left lower extremity: Enlarged superficial vein in the left popliteal region was accessed. This enlarged vessel could represent the short saphenous vein. Thrombus identified in the left femoral vein, left iliac veins and distal IVC.  The infusion catheters were positioned just above the IVC filter. The distal infusion markers are in the mid/distal femoral veins bilaterally.  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 Thrombus extends into the IVC filter. The IVC above the filter is patent.  Placement of bilateral lower extremity infusion catheters from a popliteal approach. Infusion catheters extend into the IVC. Initiation of catheter directed thrombolytic therapy through both infusion catheters.  PLAN: Plan for catheter directed thrombolysis for approximately 24 hours. Patient will return to Interventional Radiology on 10/18/2014 for follow-up venography.   Electronically Signed   By: Markus Daft M.D.   On: 10/17/2014 18:15   Ir Pta Venous Left  10/18/2014   CLINICAL DATA:  24 hours venous lytic follow-up. There is  significant bleeding from the left upper extremity PICC site. Fibrinogen levels have been dropping. The lytic and heparin has been stopped.  EXAM: IR THROMB F/U EVAL ART/VEN SUBSEQ DAY (MS); PTA VENOUS  FLUOROSCOPY TIME:  7 minutes and 24 seconds  MEDICATIONS AND MEDICAL HISTORY: Versed 2 mg, Fentanyl 100 mcg.  Additional Medications: None.  ANESTHESIA/SEDATION: Moderate sedation time: 30 minutes  CONTRAST:  100 cc Omnipaque 300  PROCEDURE: The procedure, risks, benefits, and alternatives were explained to the patient. Questions regarding the procedure were encouraged and answered. The patient understands and consents to the procedure.  The bilateral popliteal fossas were prepped with Betadine in a sterile fashion, and a sterile drape was applied covering the operative field. A sterile gown and sterile gloves were used for the procedure.  Six [place] sheaths were exchanged  for 7 French sheaths in the popliteal fossas bilaterally after 1% lidocaine infiltration.  Bilateral lower extremity and iliac venography was performed. This demonstrates significant improvement with antegrade flow but significant residual thrombus in the femoral and iliac venous system. The IVC filter and IVC were noted to be widely patent.  Bilateral lower extremities as well as the iliac venous systems were then lysed but the Angiojet for nearly 150 seconds. Repeat venography was performed.  The left common iliac vein was dilated to 12 mm. Repeat venography was performed.  The Angiojet was then administered within the left common iliac vein foreign additional 50 seconds. Final venogram was performed.  Sheaths were removed and hemostasis was achieved with Vpad and direct pressure.  FINDINGS: Initial venography demonstrates wide patency of the IVC. There is significant improvement in the iliac venous system and femoral venous system with some residual thrombus.  After utilizing the Angiojet, the femoral venous system was noted to be patent  bilaterally. The right iliac venous system was patent. There was significant residual thrombus in the left iliac venous system with luminal narrowing  After dilatation to 12 mm, the left common iliac vein was improved  After the final Angiojet administration in the left common iliac vein, final venography demonstrates further improvement but some residual thrombus. Brisk antegrade flow was established bilaterally. The IVC filter remained widely patent.  COMPLICATIONS: None  IMPRESSION: Overall, there has been significant improvement after 24 hours of lysis, Angiojet, and angioplasty. There is some residual thrombus in the left common iliac vein. The patient will be anticoagulated with heparin.   Electronically Signed   By: Marybelle Killings M.D.   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 The patient has an IVC filter in place. Patient has bilateral lower extremity edema.  EXAM: BILATERAL LOWER EXTREMITY VENOGRAPHY ; IVC VENOGRAM; PLACEMENT OF BILATERAL INFUSION CATHETERS AND INITIATION OF CATHETER DIRECTED THROMBOLYTIC THERAPY; ULTRASOUND GUIDANCE FOR VENOUS ACCESS IN THE RIGHT LEG; ULTRASOUND GUIDANCE FOR VENOUS ACCESS IN THE LEFT LEG  Physician: Stephan Minister. Henn, MD  FLUOROSCOPY TIME:  8 minutes and 42 seconds, 273.9 mGy  MEDICATIONS: 2 mg Versed, 75 mcg fentanyl. A radiology nurse monitored the patient for moderate sedation.  ANESTHESIA/SEDATION: Moderate sedation time: 45 minutes  PROCEDURE: The procedure was explained to the patient. The risks and benefits of the procedure were discussed and the patient's questions were addressed. Informed consent was obtained from the patient.  Patient was placed prone on the interventional table. Both popliteal regions were evaluated with ultrasound. The popliteal regions were prepped and draped in sterile fashion. Maximal barrier sterile technique was utilized including  caps, mask, sterile gowns, sterile gloves, sterile drape, hand hygiene and skin antiseptic. Skin was anesthetized with 1% lidocaine. Using ultrasound guidance, a 21 gauge needle was directed into an enlarged and occluded proximal tibial vein near the popliteal vein. Wire was advanced into the deep venous system. Microfracture catheter was placed. A 6 French vascular sheath was placed. Using ultrasound guidance, a 21 gauge needle was directed into an enlarged superficial and thrombosed vein in the left calf. This may represent the left short saphenous vein. Wire was advanced into the popliteal vein and a micropuncture dilator set was placed. A 6 French vascular sheath was placed.  A 5 French, 100 cm catheter was used to perform left lower extremity venogram and IVC venogram in different segments. Catheter  was advanced up the left leg and left pelvis with a Glidewire. The Glidewire would not easily advanced through the filter and filter thrombus. Stiff Amplatz wire was easily advanced through filter and a catheter was advanced above the filter. IVC venogram confirmed patency of the IVC above the filter. A 90 cm infusion catheter with 50 cm of infusion length was easily advanced over the Amplatz wire. The catheter tip was placed proximal to the IVC filter hook. In a similar fashion, the 5 [place] catheter was advanced up the right leg. Venography was performed in different segments. Catheter easily advanced up the right leg with a Glidewire and advanced into the IVC with an Amplatz wire. A second 90 cm infusion catheter with 50 cm of infusion length was advanced over the Amplatz wire and the tip was positioned proximal to the IVC filter. The wires were placed in both infusion catheters. Catheters were secured to the back of the legs.  Catheter directed thrombolytic therapy was started through each infusion catheter. Tenecteplase at 0.25 mg/hour will be infuse through each catheter for total of 0.5 mg/hour. Patient was  transferred to an intensive care unit bed.  FINDINGS: Ultrasound demonstrated thrombus in the common femoral veins bilaterally. There is also thrombus in the popliteal veins bilaterally.  Right lower extremity: Thrombus in an enlarged tibial vein was accessed with ultrasound. Thrombus throughout the right femoral vein and right iliac veins. Thrombus within the infrarenal IVC including the filter. The IVC above the filter is patent.  Left lower extremity: Enlarged superficial vein in the left popliteal region was accessed. This enlarged vessel could represent the short saphenous vein. Thrombus identified in the left femoral vein, left iliac veins and distal IVC.  The infusion catheters were positioned just above the IVC filter. The distal infusion markers are in the mid/distal femoral veins bilaterally.  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 Thrombus extends into the IVC filter. The IVC above the filter is patent.  Placement of bilateral lower extremity infusion catheters from a popliteal approach. Infusion catheters extend into the IVC. Initiation of catheter directed thrombolytic therapy through both infusion catheters.  PLAN: Plan for catheter directed thrombolysis for approximately 24 hours. Patient will return to Interventional Radiology on 10/18/2014 for follow-up venography.   Electronically Signed   By: Markus Daft M.D.   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 The patient has an IVC filter in place. Patient has bilateral lower extremity edema.  EXAM: BILATERAL LOWER EXTREMITY VENOGRAPHY ; IVC VENOGRAM; PLACEMENT OF BILATERAL INFUSION CATHETERS AND INITIATION OF CATHETER DIRECTED THROMBOLYTIC THERAPY; ULTRASOUND GUIDANCE FOR VENOUS ACCESS IN THE RIGHT LEG; ULTRASOUND GUIDANCE FOR VENOUS  ACCESS IN THE LEFT LEG  Physician: Stephan Minister. Henn, MD  FLUOROSCOPY TIME:  8 minutes and 42 seconds, 273.9 mGy  MEDICATIONS: 2 mg Versed, 75 mcg fentanyl. A radiology nurse monitored the patient for moderate sedation.  ANESTHESIA/SEDATION: Moderate sedation time: 45 minutes  PROCEDURE: The procedure was explained to the patient. The risks and benefits of the procedure were discussed and the patient's questions were addressed. Informed consent was obtained from the patient.  Patient was placed prone on the interventional table. Both popliteal regions were evaluated with ultrasound. The popliteal regions were prepped and draped in sterile fashion. Maximal barrier sterile technique was utilized including caps,  mask, sterile gowns, sterile gloves, sterile drape, hand hygiene and skin antiseptic. Skin was anesthetized with 1% lidocaine. Using ultrasound guidance, a 21 gauge needle was directed into an enlarged and occluded proximal tibial vein near the popliteal vein. Wire was advanced into the deep venous system. Microfracture catheter was placed. A 6 French vascular sheath was placed. Using ultrasound guidance, a 21 gauge needle was directed into an enlarged superficial and thrombosed vein in the left calf. This may represent the left short saphenous vein. Wire was advanced into the popliteal vein and a micropuncture dilator set was placed. A 6 French vascular sheath was placed.  A 5 French, 100 cm catheter was used to perform left lower extremity venogram and IVC venogram in different segments. Catheter was advanced up the left leg and left pelvis with a Glidewire. The Glidewire would not easily advanced through the filter and filter thrombus. Stiff Amplatz wire was easily advanced through filter and a catheter was advanced above the filter. IVC venogram confirmed patency of the IVC above the filter. A 90 cm infusion catheter with 50 cm of infusion length was easily advanced over the Amplatz wire. The catheter tip was  placed proximal to the IVC filter hook. In a similar fashion, the 5 [place] catheter was advanced up the right leg. Venography was performed in different segments. Catheter easily advanced up the right leg with a Glidewire and advanced into the IVC with an Amplatz wire. A second 90 cm infusion catheter with 50 cm of infusion length was advanced over the Amplatz wire and the tip was positioned proximal to the IVC filter. The wires were placed in both infusion catheters. Catheters were secured to the back of the legs.  Catheter directed thrombolytic therapy was started through each infusion catheter. Tenecteplase at 0.25 mg/hour will be infuse through each catheter for total of 0.5 mg/hour. Patient was transferred to an intensive care unit bed.  FINDINGS: Ultrasound demonstrated thrombus in the common femoral veins bilaterally. There is also thrombus in the popliteal veins bilaterally.  Right lower extremity: Thrombus in an enlarged tibial vein was accessed with ultrasound. Thrombus throughout the right femoral vein and right iliac veins. Thrombus within the infrarenal IVC including the filter. The IVC above the filter is patent.  Left lower extremity: Enlarged superficial vein in the left popliteal region was accessed. This enlarged vessel could represent the short saphenous vein. Thrombus identified in the left femoral vein, left iliac veins and distal IVC.  The infusion catheters were positioned just above the IVC filter. The distal infusion markers are in the mid/distal femoral veins bilaterally.  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 Thrombus extends into the IVC filter. The IVC above the filter is patent.  Placement of bilateral lower extremity infusion catheters from a popliteal approach. Infusion catheters extend into the IVC. Initiation of catheter directed thrombolytic therapy through both infusion  catheters.  PLAN: Plan for catheter directed thrombolysis for approximately 24 hours. Patient will return to Interventional Radiology on 10/18/2014 for follow-up venography.   Electronically Signed   By: Markus Daft M.D.   On: 10/17/2014 18:15   Ir Infusion Thrombol Venous Initial (ms)  10/17/2014   CLINICAL DATA:  74 year old with bilateral lower extremity DVT and iliocaval thrombus. The patient has an IVC filter in place. Patient has bilateral lower extremity edema.  EXAM: BILATERAL LOWER EXTREMITY VENOGRAPHY ; IVC VENOGRAM; PLACEMENT OF BILATERAL INFUSION CATHETERS AND INITIATION  OF CATHETER DIRECTED THROMBOLYTIC THERAPY; ULTRASOUND GUIDANCE FOR VENOUS ACCESS IN THE RIGHT LEG; ULTRASOUND GUIDANCE FOR VENOUS ACCESS IN THE LEFT LEG  Physician: Stephan Minister. Henn, MD  FLUOROSCOPY TIME:  8 minutes and 42 seconds, 273.9 mGy  MEDICATIONS: 2 mg Versed, 75 mcg fentanyl. A radiology nurse monitored the patient for moderate sedation.  ANESTHESIA/SEDATION: Moderate sedation time: 45 minutes  PROCEDURE: The procedure was explained to the patient. The risks and benefits of the procedure were discussed and the patient's questions were addressed. Informed consent was obtained from the patient.  Patient was placed prone on the interventional table. Both popliteal regions were evaluated with ultrasound. The popliteal regions were prepped and draped in sterile fashion. Maximal barrier sterile technique was utilized including caps, mask, sterile gowns, sterile gloves, sterile drape, hand hygiene and skin antiseptic. Skin was anesthetized with 1% lidocaine. Using ultrasound guidance, a 21 gauge needle was directed into an enlarged and occluded proximal tibial vein near the popliteal vein. Wire was advanced into the deep venous system. Microfracture catheter was placed. A 6 French vascular sheath was placed. Using ultrasound guidance, a 21 gauge needle was directed into an enlarged superficial and thrombosed vein in the left calf. This  may represent the left short saphenous vein. Wire was advanced into the popliteal vein and a micropuncture dilator set was placed. A 6 French vascular sheath was placed.  A 5 French, 100 cm catheter was used to perform left lower extremity venogram and IVC venogram in different segments. Catheter was advanced up the left leg and left pelvis with a Glidewire. The Glidewire would not easily advanced through the filter and filter thrombus. Stiff Amplatz wire was easily advanced through filter and a catheter was advanced above the filter. IVC venogram confirmed patency of the IVC above the filter. A 90 cm infusion catheter with 50 cm of infusion length was easily advanced over the Amplatz wire. The catheter tip was placed proximal to the IVC filter hook. In a similar fashion, the 5 [place] catheter was advanced up the right leg. Venography was performed in different segments. Catheter easily advanced up the right leg with a Glidewire and advanced into the IVC with an Amplatz wire. A second 90 cm infusion catheter with 50 cm of infusion length was advanced over the Amplatz wire and the tip was positioned proximal to the IVC filter. The wires were placed in both infusion catheters. Catheters were secured to the back of the legs.  Catheter directed thrombolytic therapy was started through each infusion catheter. Tenecteplase at 0.25 mg/hour will be infuse through each catheter for total of 0.5 mg/hour. Patient was transferred to an intensive care unit bed.  FINDINGS: Ultrasound demonstrated thrombus in the common femoral veins bilaterally. There is also thrombus in the popliteal veins bilaterally.  Right lower extremity: Thrombus in an enlarged tibial vein was accessed with ultrasound. Thrombus throughout the right femoral vein and right iliac veins. Thrombus within the infrarenal IVC including the filter. The IVC above the filter is patent.  Left lower extremity: Enlarged superficial vein in the left popliteal region was  accessed. This enlarged vessel could represent the short saphenous vein. Thrombus identified in the left femoral vein, left iliac veins and distal IVC.  The infusion catheters were positioned just above the IVC filter. The distal infusion markers are in the mid/distal femoral veins bilaterally.  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 Thrombus extends into the  IVC filter. The IVC above the filter is patent.  Placement of bilateral lower extremity infusion catheters from a popliteal approach. Infusion catheters extend into the IVC. Initiation of catheter directed thrombolytic therapy through both infusion catheters.  PLAN: Plan for catheter directed thrombolysis for approximately 24 hours. Patient will return to Interventional Radiology on 10/18/2014 for follow-up venography.   Electronically Signed   By: Markus Daft M.D.   On: 10/17/2014 18:15   Ir Jacolyn Reedy F/u Eval Art/ven Alwyn Ren Day (ms)  10/18/2014   CLINICAL DATA:  24 hours venous lytic follow-up. There is significant bleeding from the left upper extremity PICC site. Fibrinogen levels have been dropping. The lytic and heparin has been stopped.  EXAM: IR THROMB F/U EVAL ART/VEN SUBSEQ DAY (MS); PTA VENOUS  FLUOROSCOPY TIME:  7 minutes and 24 seconds  MEDICATIONS AND MEDICAL HISTORY: Versed 2 mg, Fentanyl 100 mcg.  Additional Medications: None.  ANESTHESIA/SEDATION: Moderate sedation time: 30 minutes  CONTRAST:  100 cc Omnipaque 300  PROCEDURE: The procedure, risks, benefits, and alternatives were explained to the patient. Questions regarding the procedure were encouraged and answered. The patient understands and consents to the procedure.  The bilateral popliteal fossas were prepped with Betadine in a sterile fashion, and a sterile drape was applied covering the operative field. A sterile gown and sterile gloves were used for the procedure.  Six [place]  sheaths were exchanged for 7 French sheaths in the popliteal fossas bilaterally after 1% lidocaine infiltration.  Bilateral lower extremity and iliac venography was performed. This demonstrates significant improvement with antegrade flow but significant residual thrombus in the femoral and iliac venous system. The IVC filter and IVC were noted to be widely patent.  Bilateral lower extremities as well as the iliac venous systems were then lysed but the Angiojet for nearly 150 seconds. Repeat venography was performed.  The left common iliac vein was dilated to 12 mm. Repeat venography was performed.  The Angiojet was then administered within the left common iliac vein foreign additional 50 seconds. Final venogram was performed.  Sheaths were removed and hemostasis was achieved with Vpad and direct pressure.  FINDINGS: Initial venography demonstrates wide patency of the IVC. There is significant improvement in the iliac venous system and femoral venous system with some residual thrombus.  After utilizing the Angiojet, the femoral venous system was noted to be patent bilaterally. The right iliac venous system was patent. There was significant residual thrombus in the left iliac venous system with luminal narrowing  After dilatation to 12 mm, the left common iliac vein was improved  After the final Angiojet administration in the left common iliac vein, final venography demonstrates further improvement but some residual thrombus. Brisk antegrade flow was established bilaterally. The IVC filter remained widely patent.  COMPLICATIONS: None  IMPRESSION: Overall, there has been significant improvement after 24 hours of lysis, Angiojet, and angioplasty. There is some residual thrombus in the left common iliac vein. The patient will be anticoagulated with heparin.   Electronically Signed   By: Marybelle Killings M.D.   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  - Left sided hematoma on the arm around the PICC  PLAN - Local Lysis of  DVT by IR complete - Transfer back to SDU. - Hematology consult called and discussed case with Dr. Jonette Eva. - OOB to chair. - Heart healthy diet. - PT/OT ordered. - Given hematoma will not start oral anti-coag. - Decrease heparin drip to half dose.  Left arm hematoma:  - Hold oral anti-coag.  - Decrease 1/2 dose of heparin, discussed with pharmacy to dose.  - Hematology consult called.  DVT: S/P EKOS.  - Heparin as discussed above.  - Leg elevation.  Hypoxemia: due to PE.  - Supplemental O2.  - Keep on the dry side as BP allows.  PE:   - Anticoagulation as above.  Physical deconditioning:  - PT/OT ordered.  - OOB to chair ordered.  Atelectasis on CXR and exam:  - IS per RT protocol ordered.  - Repeat CXR ordered.  Rush Farmer, M.D. [facility] Pulmonary/Critical Care Medicine. Pager: [phone]. After hours pager: [phone].   10/19/2014 11:53 AM

## 2014-10-19 NOTE — Progress Notes (Signed)
Hemoglobin 7.1 at 0438 from 9.0 yesterday a.m. MD aware. No new orders at this time.

## 2014-10-20 DIAGNOSIS — K661 Hemoperitoneum: Secondary | ICD-10-CM

## 2014-10-20 LAB — PHOSPHORUS: PHOSPHORUS: 3.9 mg/dL (ref 2.5–4.6)

## 2014-10-20 LAB — CBC
HCT: 25.9 % — ABNORMAL LOW (ref 36.0–46.0)
Hemoglobin: 8.2 g/dL — ABNORMAL LOW (ref 12.0–15.0)
MCH: 26.5 pg (ref 26.0–34.0)
MCHC: 31.7 g/dL (ref 30.0–36.0)
MCV: 83.8 fL (ref 78.0–100.0)
PLATELETS: 161 10*3/uL (ref 150–400)
RBC: 3.09 MIL/uL — ABNORMAL LOW (ref 3.87–5.11)
RDW: 21.4 % — ABNORMAL HIGH (ref 11.5–15.5)
WBC: 8.6 10*3/uL (ref 4.0–10.5)

## 2014-10-20 LAB — CBC WITH DIFFERENTIAL/PLATELET
BASOS PCT: 0 % (ref 0–1)
Basophils Absolute: 0 10*3/uL (ref 0.0–0.1)
EOS ABS: 0.2 10*3/uL (ref 0.0–0.7)
Eosinophils Relative: 3 % (ref 0–5)
HCT: 21 % — ABNORMAL LOW (ref 36.0–46.0)
Hemoglobin: 6.3 g/dL — CL (ref 12.0–15.0)
Lymphocytes Relative: 16 % (ref 12–46)
Lymphs Abs: 1.2 10*3/uL (ref 0.7–4.0)
MCH: 25.1 pg — AB (ref 26.0–34.0)
MCHC: 30 g/dL (ref 30.0–36.0)
MCV: 83.7 fL (ref 78.0–100.0)
MONO ABS: 0.7 10*3/uL (ref 0.1–1.0)
Monocytes Relative: 9 % (ref 3–12)
Neutro Abs: 5.6 10*3/uL (ref 1.7–7.7)
Neutrophils Relative %: 72 % (ref 43–77)
PLATELETS: 155 10*3/uL (ref 150–400)
RBC: 2.51 MIL/uL — ABNORMAL LOW (ref 3.87–5.11)
RDW: 22.7 % — ABNORMAL HIGH (ref 11.5–15.5)
WBC: 7.7 10*3/uL (ref 4.0–10.5)

## 2014-10-20 LAB — HAPTOGLOBIN

## 2014-10-20 LAB — PREPARE RBC (CROSSMATCH)

## 2014-10-20 LAB — BASIC METABOLIC PANEL
Anion gap: 6 (ref 5–15)
BUN: 7 mg/dL (ref 6–20)
CO2: 27 mmol/L (ref 22–32)
Calcium: 7.8 mg/dL — ABNORMAL LOW (ref 8.9–10.3)
Chloride: 106 mmol/L (ref 101–111)
Creatinine, Ser: 0.59 mg/dL (ref 0.44–1.00)
GFR calc Af Amer: >60 mL/min (ref >60)
GLUCOSE: 109 mg/dL — AB (ref 70–99)
POTASSIUM: 3.7 mmol/L (ref 3.5–5.1)
Sodium: 139 mmol/L (ref 135–145)

## 2014-10-20 LAB — MAGNESIUM: Magnesium: 1.9 mg/dL (ref 1.7–2.4)

## 2014-10-20 LAB — HEPARIN LEVEL (UNFRACTIONATED)
HEPARIN UNFRACTIONATED: 0.1 [IU]/mL — AB (ref 0.30–0.70)
Heparin Unfractionated: 0.32 IU/mL (ref 0.30–0.70)

## 2014-10-20 MED ORDER — FUROSEMIDE 40 MG PO TABS
40.0000 mg | ORAL_TABLET | Freq: Once | ORAL | Status: AC
  Administered 2014-10-20: 40 mg via ORAL
  Filled 2014-10-20: qty 1

## 2014-10-20 MED ORDER — SODIUM CHLORIDE 0.9 % IV SOLN: Freq: Once | INTRAVENOUS | Status: DC

## 2014-10-20 NOTE — Progress Notes (Signed)
PT Cancellation Note  Patient Details Name: [name] MRN: [id number] DOB: 1940/08/29   Cancelled Treatment:    Reason Eval/Treat Not Completed: Pain limiting ability to participate   Pt declined (x3) citing too much pain; Told me she isn't taking pain meds when I offered to request something from her nurse; Because of this, we discussed the need for her to be prepared and in the mindset of getting up when PT returns tomorrow;   Thanks,  Roney Marion, PT  Acute Rehabilitation Services Pager [phone] Office [phone]    Roney Marion [facility] 10/20/2014, 4:25 PM

## 2014-10-20 NOTE — Progress Notes (Addendum)
ANTICOAGULATION CONSULT NOTE - Follow Up Consult  Pharmacy Consult for Heparin  Indication: pulmonary embolus and DVT  No Known Allergies  Patient Measurements: Height: 5\' 5"  (165.1 cm) Weight: 222 lb 10.6 oz (101 kg) IBW/kg (Calculated) : 57  Vital Signs: Temp: 98.8 F (37.1 C) (05/01 0326) Temp Source: Oral (05/01 0326) BP: 122/53 mmHg (05/01 0326) Pulse Rate: 91 (05/01 0326)  Labs:  Recent Labs  10/18/14 0244  10/18/14 0929  10/19/14 0438 10/19/14 0800 10/19/14 1930 10/20/14 0603  HGB 9.2*  --  9.0*  --  7.1*  --   --   --   HCT 29.6*  --  29.0*  --  23.4*  --   --   --   PLT 181  --  152  --  102*  --   --   --   HEPARINUNFRC  --   < >  --   < >  --  0.12* 0.23* 0.32  CREATININE 0.78  --   --   --  0.68  --   --   --   < > = values in this interval not displayed.  Estimated Creatinine Clearance: 74.9 mL/min (by C-G formula based on Cr of 0.68).   Assessment: Therapeutic heparin level after cautious rate increases due to some PICC bleeding (resolved) and mild hematuria. Pt is now >24 hours post-TNKase lysis.   Goal of Therapy:  Heparin level 0.3-0.5 units/mL Monitor platelets by anticoagulation protocol: Yes   Plan:  -Continue heparin at 1500 units/hr -1300 HL -Daily CBC/HL -Monitor for bleeding, trend Hgb  Narda Bonds 10/20/2014,6:36 AM

## 2014-10-20 NOTE — Progress Notes (Signed)
Pt's only access is a single lumen PICC. Currently receiving continuous heparin gtt. Has orders for 2 units PRBC. Spoke with Dr. Eliseo Squires regarding this issue. Received ok to transfuse first unit over 2 hours and then continue heparin until about 1900 and then transfuse second unit over 2 hours.

## 2014-10-20 NOTE — Progress Notes (Signed)
This RN communicated with pharmacy and MD, plan to hold blood, until am and recheck CBC and Heparin, nursing will cont to assess

## 2014-10-20 NOTE — Progress Notes (Signed)
PROGRESS NOTE  [name] OHY:[id number] DOB: 03-27-1941 DOA: 10/10/2014 PCP: Osborne Casco, MD  74 year old female with PMH as below. She was admitted to [facility] ED 09/27/14 with bilateral PE and received thrombolysis via EKOS on 4/9. Heprain gtt was eventually transitioned to xarelto, however, she developed periorbital ecchymosis. Xarelto was D/c'd and IVF filter was placed. She was discharged to home 4/14. 4/21 she again presented to [facility] c/o orthostasis and syncope. She was admitted and treated initially with IVF, which resulted in massive lower extremity edema. Echocardiogram has normalized since prior admission. Based on relatively normal heart and kidney function there is concern that her IVC filter may be obstructing. Started on heparin but then given catheter directed thrombolysis on 4/28.  Complicated by bleeding and bruising  Assessment/Plan: 4/9 Bilateral pulmonary emboli s/p EKOS - course complicated by periorbital bleed and s/p IVC filter placement 4/13 + oral anticoagulation held  Readmit 10/10/2014 Due to Post IVC filter Distal EXTENSIVE ILIO-FEMORAL thrombosis - Duplex LE with Acute DVT and CTabd with thrombosis distal to IVC - On IV heparin -- holding oral anticoagulation for now until bleeding resolves - She is at high risk for gangrene and post DVT syndrome -Dr. Beryle Beams saw  Anemia of CD- Hgb < 7 today so transfuse (have to hold heparin to transfuse  Hematoma of b/l arms- monitor of compartment syndrome  AKI- resolved  HLD -not on statin  GERD  R breast cancer -Dr. Lindi Adie  Weight increased 22 lbs since admission +8L per i/os Lasix x 1, my need re-dosed  Code Status: full Family Communication: patient Disposition Plan:    Consultants:  Hematology  pulm  IR  Procedures:      HPI/Subjective: Pain in arms no blood in stools   Objective: Filed Vitals:   10/20/14 0720  BP: 130/55    Pulse: 84  Temp: 98.2 F (36.8 C)  Resp: 11    Intake/Output Summary (Last 24 hours) at 10/20/14 1257 Last data filed at 10/20/14 1000  Gross per 24 hour  Intake   2900 ml  Output    850 ml  Net   2050 ml   Filed Weights   10/10/14 2004 10/11/14 0109 10/17/14 1635  Weight: 89.359 kg (197 lb) 90.4 kg (199 lb 4.7 oz) 101 kg (222 lb 10.6 oz)    Exam:   General:  A+Ox3, NAD  Cardiovascular: rrr  Respiratory: clear  Abdomen: +BS  Skin: multiple areas of bruising in b/l arms  Data Reviewed: Basic Metabolic Panel:  Recent Labs Lab 10/16/14 0840 10/17/14 0402 10/18/14 0244 10/19/14 0438 10/20/14 0602  NA 135 137 135 137 139  K 3.8 4.0 4.0 3.6 3.7  CL 100 100 100 103 106  CO2 24 27 27 27 27   GLUCOSE 119* 115* 140* 121* 109*  BUN 10 9 10 9 7   CREATININE 0.72 0.63 0.78 0.68 0.59  CALCIUM 8.7 8.7 8.5 7.9* 7.8*  MG  --   --  2.2  --  1.9  PHOS  --   --  3.0  --  3.9   Liver Function Tests:  Recent Labs Lab 10/17/14 0402  AST 42*  ALT 47*  ALKPHOS 97  BILITOT 0.7  PROT 5.4*  ALBUMIN 2.4*   No results for input(s): LIPASE, AMYLASE in the last 168 hours. No results for input(s): AMMONIA in the last 168 hours. CBC:  Recent Labs Lab 10/17/14 0402 10/17/14 1950 10/18/14 0244 10/18/14 0929 10/19/14 0438 10/20/14 0602  WBC 9.5 12.2*  12.3* 12.7* 10.5 7.7  NEUTROABS 6.8  --  10.0*  --  8.0* 5.6  HGB 9.3* 9.1* 9.2* 9.0* 7.1* 6.3*  HCT 30.4* 30.3* 29.6* 29.0* 23.4* 21.0*  MCV 82.4 82.3 81.5 82.2 82.4 83.7  PLT 258 234 181 152 102* 155   Cardiac Enzymes: No results for input(s): CKTOTAL, CKMB, CKMBINDEX, TROPONINI in the last 168 hours. BNP (last 3 results)  Recent Labs  09/27/14 1046 10/10/14 2052  BNP 379.7* 66.3    ProBNP (last 3 results) No results for input(s): PROBNP in the last 8760 hours.  CBG: No results for input(s): GLUCAP in the last 168 hours.  Recent Results (from the past 240 hour(s))  Urine culture     Status: None    Collection Time: 10/11/14  8:34 AM  Result Value Ref Range Status   Specimen Description URINE, CLEAN CATCH  Final   Special Requests NONE  Final   Colony Count   Final    >=100,000 COLONIES/ML Performed at [facility]    Culture   Final    Multiple bacterial morphotypes present, none predominant. Suggest appropriate recollection if clinically indicated. Performed at [facility]    Report Status 10/13/2014 FINAL  Final     Studies: Ir Pta Venous Left  10/18/2014   CLINICAL DATA:  24 hours venous lytic follow-up. There is significant bleeding from the left upper extremity PICC site. Fibrinogen levels have been dropping. The lytic and heparin has been stopped.  EXAM: IR THROMB F/U EVAL ART/VEN SUBSEQ DAY (MS); PTA VENOUS  FLUOROSCOPY TIME:  7 minutes and 24 seconds  MEDICATIONS AND MEDICAL HISTORY: Versed 2 mg, Fentanyl 100 mcg.  Additional Medications: None.  ANESTHESIA/SEDATION: Moderate sedation time: 30 minutes  CONTRAST:  100 cc Omnipaque 300  PROCEDURE: The procedure, risks, benefits, and alternatives were explained to the patient. Questions regarding the procedure were encouraged and answered. The patient understands and consents to the procedure.  The bilateral popliteal fossas were prepped with Betadine in a sterile fashion, and a sterile drape was applied covering the operative field. A sterile gown and sterile gloves were used for the procedure.  Six [place] sheaths were exchanged for 7 French sheaths in the popliteal fossas bilaterally after 1% lidocaine infiltration.  Bilateral lower extremity and iliac venography was performed. This demonstrates significant improvement with antegrade flow but significant residual thrombus in the femoral and iliac venous system. The IVC filter and IVC were noted to be widely patent.  Bilateral lower extremities as well as the iliac venous systems were then lysed but the Angiojet for nearly 150 seconds. Repeat venography was performed.   The left common iliac vein was dilated to 12 mm. Repeat venography was performed.  The Angiojet was then administered within the left common iliac vein foreign additional 50 seconds. Final venogram was performed.  Sheaths were removed and hemostasis was achieved with Vpad and direct pressure.  FINDINGS: Initial venography demonstrates wide patency of the IVC. There is significant improvement in the iliac venous system and femoral venous system with some residual thrombus.  After utilizing the Angiojet, the femoral venous system was noted to be patent bilaterally. The right iliac venous system was patent. There was significant residual thrombus in the left iliac venous system with luminal narrowing  After dilatation to 12 mm, the left common iliac vein was improved  After the final Angiojet administration in the left common iliac vein, final venography demonstrates further improvement but some residual thrombus. Brisk antegrade flow was  established bilaterally. The IVC filter remained widely patent.  COMPLICATIONS: None  IMPRESSION: Overall, there has been significant improvement after 24 hours of lysis, Angiojet, and angioplasty. There is some residual thrombus in the left common iliac vein. The patient will be anticoagulated with heparin.   Electronically Signed   By: Marybelle Killings M.D.   On: 10/18/2014 17:16   Ir Jacolyn Reedy F/u Eval Art/ven Final Day (ms)  10/18/2014   CLINICAL DATA:  24 hours venous lytic follow-up. There is significant bleeding from the left upper extremity PICC site. Fibrinogen levels have been dropping. The lytic and heparin has been stopped.  EXAM: IR THROMB F/U EVAL ART/VEN SUBSEQ DAY (MS); PTA VENOUS  FLUOROSCOPY TIME:  7 minutes and 24 seconds  MEDICATIONS AND MEDICAL HISTORY: Versed 2 mg, Fentanyl 100 mcg.  Additional Medications: None.  ANESTHESIA/SEDATION: Moderate sedation time: 30 minutes  CONTRAST:  100 cc Omnipaque 300  PROCEDURE: The procedure, risks, benefits, and alternatives were  explained to the patient. Questions regarding the procedure were encouraged and answered. The patient understands and consents to the procedure.  The bilateral popliteal fossas were prepped with Betadine in a sterile fashion, and a sterile drape was applied covering the operative field. A sterile gown and sterile gloves were used for the procedure.  Six [place] sheaths were exchanged for 7 French sheaths in the popliteal fossas bilaterally after 1% lidocaine infiltration.  Bilateral lower extremity and iliac venography was performed. This demonstrates significant improvement with antegrade flow but significant residual thrombus in the femoral and iliac venous system. The IVC filter and IVC were noted to be widely patent.  Bilateral lower extremities as well as the iliac venous systems were then lysed but the Angiojet for nearly 150 seconds. Repeat venography was performed.  The left common iliac vein was dilated to 12 mm. Repeat venography was performed.  The Angiojet was then administered within the left common iliac vein foreign additional 50 seconds. Final venogram was performed.  Sheaths were removed and hemostasis was achieved with Vpad and direct pressure.  FINDINGS: Initial venography demonstrates wide patency of the IVC. There is significant improvement in the iliac venous system and femoral venous system with some residual thrombus.  After utilizing the Angiojet, the femoral venous system was noted to be patent bilaterally. The right iliac venous system was patent. There was significant residual thrombus in the left iliac venous system with luminal narrowing  After dilatation to 12 mm, the left common iliac vein was improved  After the final Angiojet administration in the left common iliac vein, final venography demonstrates further improvement but some residual thrombus. Brisk antegrade flow was established bilaterally. The IVC filter remained widely patent.  COMPLICATIONS: None  IMPRESSION: Overall, there  has been significant improvement after 24 hours of lysis, Angiojet, and angioplasty. There is some residual thrombus in the left common iliac vein. The patient will be anticoagulated with heparin.   Electronically Signed   By: Marybelle Killings M.D.   On: 10/18/2014 17:16    Scheduled Meds: . sodium chloride   Intravenous Once  . acidophilus  1 capsule Oral Daily  . calcium carbonate  1,500 mg of elemental calcium Oral Q breakfast  . cholecalciferol  5,000 Units Oral Daily  . famotidine  20 mg Oral Daily  . hydrocortisone cream   Topical BID  . iron polysaccharides  150 mg Oral Daily  . multivitamin with minerals  1 tablet Oral Daily  . omega-3 acid ethyl esters  1 g Oral Daily  .  sodium chloride  3 mL Intravenous Q12H  . sodium chloride  3 mL Intravenous Q12H  . vitamin C  250 mg Oral Daily  . vitamin E  100 Units Oral Daily   Continuous Infusions: . heparin 1,500 Units/hr (10/20/14 1015)  . tenecteplase (TNKase) infusion 0.25 mg/hr (10/18/14 1141)  . 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 If 7PM-7AM, please contact night-coverage at [email or web address], password [facility] 10/20/2014, 12:57 PM  LOS: 10 days

## 2014-10-20 NOTE — Progress Notes (Signed)
ANTICOAGULATION CONSULT NOTE - Follow Up Consult  Pharmacy Consult for Heparin  Indication: pulmonary embolus and DVT  No Known Allergies  Patient Measurements: Height: 5\' 5"  (165.1 cm) Weight: 222 lb 10.6 oz (101 kg) IBW/kg (Calculated) : 57  Vital Signs: Temp: 98.6 F (37 C) (05/01 1607) Temp Source: Oral (05/01 1607) BP: 125/52 mmHg (05/01 1607) Pulse Rate: 98 (05/01 1607)  Labs:  Recent Labs  10/18/14 0244  10/19/14 0438  10/19/14 1930 10/20/14 0602 10/20/14 0603 10/20/14 1830  HGB 9.2*  < > 7.1*  --   --  6.3*  --  8.2*  HCT 29.6*  < > 23.4*  --   --  21.0*  --  25.9*  PLT 181  < > 102*  --   --  155  --  161  HEPARINUNFRC  --   < >  --   < > 0.23*  --  0.32 0.10*  CREATININE 0.78  --  0.68  --   --  0.59  --   --   < > = values in this interval not displayed.  Estimated Creatinine Clearance: 74.9 mL/min (by C-G formula based on Cr of 0.59).   Assessment: Therapeutic heparin level finally achieved this am after cautious rate increases due to some PICC bleeding (intermittent) and mild hematuria. Pt is now >24 hours post-TNKase lysis. Hgb down to 6.3, s/p 1 unit of blood that required that the heparin be turned off to be transfused.  Heparin was resumed at 1600 today at 1500 units/hr.   Spoke with Donald Siva, given Hgb above 8 and the patient is currently clinically stable, will continue heparin at 1500 units/hr and plan to check heparin level and CBC with am labs.    Goal of Therapy:  Heparin level 0.3-0.5 units/mL Monitor platelets by anticoagulation protocol: Yes   Plan:  -Continue heparin at 1500 units/hr --Daily CBC/HL -Monitor for bleeding, trend Hgb Thank you for allowing pharmacy to be a part of this patients care team.  Rowe Robert Pharm.D., BCPS, AQ-Cardiology Clinical Pharmacist 10/20/2014 8:29 PM Pager: [phone] Phone: [phone]

## 2014-10-21 DIAGNOSIS — D62 Acute posthemorrhagic anemia: Secondary | ICD-10-CM | POA: Diagnosis present

## 2014-10-21 DIAGNOSIS — R609 Edema, unspecified: Secondary | ICD-10-CM | POA: Diagnosis present

## 2014-10-21 LAB — BASIC METABOLIC PANEL
Anion gap: 8 (ref 5–15)
BUN: 7 mg/dL (ref 6–20)
CO2: 26 mmol/L (ref 22–32)
Calcium: 8 mg/dL — ABNORMAL LOW (ref 8.9–10.3)
Chloride: 105 mmol/L (ref 101–111)
Creatinine, Ser: 0.64 mg/dL (ref 0.44–1.00)
Glucose, Bld: 116 mg/dL — ABNORMAL HIGH (ref 70–99)
Potassium: 3.4 mmol/L — ABNORMAL LOW (ref 3.5–5.1)
Sodium: 139 mmol/L (ref 135–145)

## 2014-10-21 LAB — CBC WITH DIFFERENTIAL/PLATELET
BASOS PCT: 0 % (ref 0–1)
Basophils Absolute: 0 10*3/uL (ref 0.0–0.1)
Eosinophils Absolute: 0.3 10*3/uL (ref 0.0–0.7)
Eosinophils Relative: 4 % (ref 0–5)
HEMATOCRIT: 25.9 % — AB (ref 36.0–46.0)
Hemoglobin: 7.9 g/dL — ABNORMAL LOW (ref 12.0–15.0)
Lymphocytes Relative: 17 % (ref 12–46)
Lymphs Abs: 1.4 10*3/uL (ref 0.7–4.0)
MCH: 25.6 pg — ABNORMAL LOW (ref 26.0–34.0)
MCHC: 30.5 g/dL (ref 30.0–36.0)
MCV: 83.8 fL (ref 78.0–100.0)
MONOS PCT: 10 % (ref 3–12)
Monocytes Absolute: 0.8 10*3/uL (ref 0.1–1.0)
NEUTROS ABS: 5.5 10*3/uL (ref 1.7–7.7)
Neutrophils Relative %: 69 % (ref 43–77)
Platelets: 176 10*3/uL (ref 150–400)
RBC: 3.09 MIL/uL — AB (ref 3.87–5.11)
RDW: 22.3 % — ABNORMAL HIGH (ref 11.5–15.5)
WBC: 8 10*3/uL (ref 4.0–10.5)

## 2014-10-21 LAB — HEPARIN LEVEL (UNFRACTIONATED)
Heparin Unfractionated: 0.28 IU/mL — ABNORMAL LOW (ref 0.30–0.70)
Heparin Unfractionated: 0.62 IU/mL (ref 0.30–0.70)

## 2014-10-21 MED ORDER — POTASSIUM CHLORIDE 20 MEQ/15ML (10%) PO SOLN
40.0000 meq | Freq: Once | ORAL | Status: AC
  Administered 2014-10-21: 40 meq via ORAL
  Filled 2014-10-21 (×2): qty 30

## 2014-10-21 MED ORDER — POTASSIUM CHLORIDE CRYS ER 20 MEQ PO TBCR
40.0000 meq | EXTENDED_RELEASE_TABLET | Freq: Once | ORAL | Status: DC
  Filled 2014-10-21: qty 2

## 2014-10-21 MED ORDER — HEPARIN (PORCINE) IN NACL 100-0.45 UNIT/ML-% IJ SOLN
1550.0000 [IU]/h | INTRAMUSCULAR | Status: DC
  Administered 2014-10-21 – 2014-10-23 (×2): 1550 [IU]/h via INTRAVENOUS
  Filled 2014-10-21 (×5): qty 250

## 2014-10-21 NOTE — Progress Notes (Signed)
ANTICOAGULATION CONSULT NOTE - Follow Up Consult  Pharmacy Consult for Heparin  Indication: pulmonary embolus and DVT  No Known Allergies  Patient Measurements: Height: 5\' 5"  (165.1 cm) Weight: 222 lb 10.6 oz (101 kg) IBW/kg (Calculated) : 57  Vital Signs: Temp: 98.2 F (36.8 C) (05/02 0400) Temp Source: Oral (05/02 0400) BP: 126/55 mmHg (05/02 0400) Pulse Rate: 78 (05/02 0400)  Labs:  Recent Labs  10/19/14 0438  10/20/14 0602 10/20/14 0603 10/20/14 1830 10/21/14 0313  HGB 7.1*  --  6.3*  --  8.2* 7.9*  HCT 23.4*  --  21.0*  --  25.9* 25.9*  PLT 102*  --  155  --  161 176  HEPARINUNFRC  --   < >  --  0.32 0.10* 0.28*  CREATININE 0.68  --  0.59  --   --   --   < > = values in this interval not displayed.  Estimated Creatinine Clearance: 74.9 mL/min (by C-G formula based on Cr of 0.59).   Assessment: Sub-therapeutic heparin level after re-start earlier tonight, pt is s/p PRBCs for low Hgb (post-check Hgb up to 8.2), hematuria still present, no active bleeding around PICC. Pt is >24 hours s/p TNKase Lysis.   Goal of Therapy:  Heparin level 0.3-0.5 units/mL Monitor platelets by anticoagulation protocol: Yes   Plan:  -Increase heparin to 1600 units/hr -1300 HL -Daily CBC/HL -Monitor for bleeding, trend Hgb  Narda Bonds 10/21/2014,4:15 AM

## 2014-10-21 NOTE — Progress Notes (Signed)
Patient ID: [name], female   DOB: 1940/12/22, 74 y.o.   MRN: [id number] Hematology: Successful thrombolysis of bilateral lower extremity thrombi via posterior popliteal vein approach.  Small ecchymoses in popliteal fossae but, once again, she developed soft tissue bleeding bilateral upper extremities in pressure areas including blood pressure cuff. Hemoglobin fell to 6.3 requiring blood transfusion again. This is either an unusual sensitivity to the thrombolytic drugs or a concomitant platelet function defect.  She doesn't have the usual risk factors for acquired Von Willebrand's disease and has had multiple surgical procedures in the past without excessive bleeding but I will go ahead and check a VW profile and platelet function tests.  Avoid all aspirin and nonsteroidals.

## 2014-10-21 NOTE — Progress Notes (Signed)
ANTICOAGULATION CONSULT NOTE - Follow Up Consult  Pharmacy Consult:  Heparin Indication:  History of recent PE + new acute DVT  No Known Allergies  Patient Measurements: Height: 5\' 5"  (165.1 cm) Weight: 222 lb 10.6 oz (101 kg) IBW/kg (Calculated) : 57 Heparin Dosing Weight:  80 kg  Vital Signs: Temp: 98.3 F (36.8 C) (05/02 0830) Temp Source: Oral (05/02 0830) BP: 135/51 mmHg (05/02 0830) Pulse Rate: 77 (05/02 0830)  Labs:  Recent Labs  10/19/14 0438  10/20/14 0602  10/20/14 1830 10/21/14 0313 10/21/14 1310  HGB 7.1*  --  6.3*  --  8.2* 7.9*  --   HCT 23.4*  --  21.0*  --  25.9* 25.9*  --   PLT 102*  --  155  --  161 176  --   HEPARINUNFRC  --   < >  --   < > 0.10* 0.28* 0.62  CREATININE 0.68  --  0.59  --   --  0.64  --   < > = values in this interval not displayed.  Estimated Creatinine Clearance: 74.9 mL/min (by C-G formula based on Cr of 0.64).     Assessment: 34 YOF with history of recent PE on Xarelto but was discontinued due to periorbital ecchymoses.  Now with new acute DVT.  Patient continues on IV heparin.  Her hematuria is resolving, bleeding from PICC site is stable, and her hematomas are stable or slightly bigger.  MD, RN, and PharmD are all aware and monitoring closely.  Patient's heparin level is therapeutic but toward the high end of goal.  Prefer to keep patient toward the low end given bleeding/oozing.   Goal of Therapy:  Heparin level 0.3-0.7 units/ml, aim low Monitor platelets by anticoagulation protocol: Yes    Plan:  - Decrease heparin slightly to 1550 units/hr - Recheck level later tonight - Daily HL / CBC - Monitor for resolution/worsening of bleeding/hematoma - F/U long-term AC plan    Chinelo Benn D. Mina Marble, PharmD, BCPS Pager:  [phone] 10/21/2014, 2:29 PM

## 2014-10-21 NOTE — Care Management Note (Addendum)
Case Management Note  Patient Details  Name: [name] MRN: [id number] Date of Birth: January 04, 1941  Subjective/Objective:            Pt from home with husband admitted with bilateral LE DVT'S.       Action/Plan: Return home when medically stable. CM  To f/u with disposition needs.  Expected Discharge Date:  10/21/14               Expected Discharge Plan:  Home/Self Care  In-House Referral:     Discharge planning Services  CM Consult  Post Acute Care Choice:    Choice offered to:     DME Arranged:    DME Agency:     HH Arranged:    HH Agency:     Status of Service:     Medicare Important Message Given:  Yes Date Medicare IM Given:  10/21/14 Medicare IM give by:  Whitman Hero RN Date Additional Medicare IM Given:    Additional Medicare Important Message give by:     If discussed at [place] of Stay Meetings, dates discussed:    Additional Comments: Emergency contact BJ Copen (husband)[phone] Whitman Hero [place], [place] 10/21/2014, 2:19 PM

## 2014-10-21 NOTE — Progress Notes (Signed)
Physical Therapy Treatment Patient Details Name: [name] MRN: [id number] DOB: May 16, 1941 Today's Date: 10/21/2014    History of Present Illness Patient is a 74 yo female admitted 10/10/14 following syncopal episode.  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  PMH:  HTN, HLD, breast CA, IVC filter, fibromyalgia, depression    PT Comments    Progressing slowly, now somewhat self limiting.  Pt should be able to start moving in the hall.   Follow Up Recommendations  No PT follow up;Supervision - Intermittent     Equipment Recommendations  Other (comment)    Recommendations for Other Services       Precautions / Restrictions Precautions Precautions: Fall    Mobility  Bed Mobility Overal bed mobility: Needs Assistance Bed Mobility: Supine to Sit     Supine to sit: Min assist     General bed mobility comments: scoot to EOB with min  Transfers Overall transfer level: Needs assistance Equipment used: Rolling walker (2 wheeled) Transfers: Sit to/from Stand Sit to Stand: Min assist         General transfer comment: Verbal cues for hand placement.  Supervision for safety only.  Ambulation/Gait Ambulation/Gait assistance: Min assist Ambulation Distance (Feet): 15 Feet Assistive device: Rolling walker (2 wheeled) Gait Pattern/deviations: Step-through pattern Gait velocity: Decreased   General Gait Details: slow and guarded; good use fo the RW.  painful legs   Stairs            Wheelchair Mobility    Modified Rankin (Stroke Patients Only)       Balance Overall balance assessment: Needs assistance Sitting-balance support: No upper extremity supported Sitting balance-Leahy Scale: Fair     Standing balance support: No upper extremity supported;Single extremity supported Standing balance-Leahy Scale: Fair                      Cognition Arousal/Alertness: Awake/alert Behavior During Therapy: WFL for tasks  assessed/performed Overall Cognitive Status: Within Functional Limits for tasks assessed                      Exercises      General Comments        Pertinent Vitals/Pain Pain Assessment: Faces Faces Pain Scale: Hurts little more Pain Location: feet, joints Pain Descriptors / Indicators: Grimacing;Sore Pain Intervention(s): Monitored during session;Repositioned    Home Living                      Prior Function            PT Goals (current goals can now be found in the care plan section) Acute Rehab PT Goals Patient Stated Goal: To return home PT Goal Formulation: With patient Time For Goal Achievement: 10/25/14 Potential to Achieve Goals: Good Progress towards PT goals: Progressing toward goals    Frequency  Min 3X/week    PT Plan Current plan remains appropriate    Co-evaluation             End of Session   Activity Tolerance: Patient limited by pain Patient left: in chair;with call bell/phone within reach     Time: 1225-1246 PT Time Calculation (min) (ACUTE ONLY): 21 min  Charges:                       G Codes:      Everest Brod, [name] 10/21/2014, 5:56 PM 10/21/2014  [name], Rowena [phone]  (  pager)

## 2014-10-21 NOTE — Progress Notes (Signed)
PROGRESS NOTE  [name] IDP:[id number] DOB: 08/20/1940 DOA: 10/10/2014 PCP: Osborne Casco, MD  Chart reviewed. 74 year old female with PMH as below. She was admitted to [facility] ED 09/27/14 with bilateral PE and received thrombolysis via EKOS on 4/9. Heprain gtt was eventually transitioned to xarelto, however, she developed periorbital ecchymosis. Xarelto was D/c'd and IVF filter was placed. She was discharged to home 4/14. 4/21 she again presented to [facility] c/o orthostasis and syncope. She was admitted and treated initially with IVF, which resulted in massive lower extremity edema. Echocardiogram has normalized since prior admission. Repeat CT showed improved PEs, bilateral LE DVTs. Venogram showed clot extending to IVC. DVTs lysed. Started on heparin gtt. Has had hematoma development in both arms, and has required blood transfusion  Assessment/Plan:  Bilateral LE DVT and IVC thrombosis - On IV heparin -- holding oral anticoagulation for now until bleeding resolves Continue heparin gtt for now until h/h stabilizes. [place] recommends resuming noac, even xarelto  ABLA: transfused yesterday  Hematoma of b/l arms- no evidence of compartment syndrome  AKI- resolved  HLD -not on statin  GERD  Hypokalemia: replete  R breast cancer -Dr. Lindi Adie  Edema: no weight today. Monitor and may need more lasix  Code Status: full Family Communication: patient is a and o Disposition Plan:    Consultants:  Hematology  pulm  IR  Procedures:  Thrombolysis LE DVTs  PICC  venougram  HPI/Subjective: Feels less swollen.  First time OOB.  No new bruises. Noted some blood in urine.  Frustrated with hospitalization.  No dyspnea. Good uop yesterday after lasix  Objective: Filed Vitals:   10/21/14 0830  BP: 135/51  Pulse: 77  Temp: 98.3 F (36.8 C)  Resp: 19    Intake/Output Summary (Last 24 hours) at 10/21/14 1339 Last data filed at 10/21/14 1026  Gross per  24 hour  Intake 2162.2 ml  Output   2550 ml  Net -387.8 ml   Filed Weights   10/10/14 2004 10/11/14 0109 10/17/14 1635  Weight: 89.359 kg (197 lb) 90.4 kg (199 lb 4.7 oz) 101 kg (222 lb 10.6 oz)    Exam:   General:  In chair. Appears comfortable. Oriented. sarcastic  Cardiovascular: rrr without MGR  Respiratory: clear without WRR  Abdomen: +BS, S, NT, ND  ext: arms and legs edematous.   Skin: echymoses noted  Data Reviewed: Basic Metabolic Panel:  Recent Labs Lab 10/17/14 0402 10/18/14 0244 10/19/14 0438 10/20/14 0602 10/21/14 0313  NA 137 135 137 139 139  K 4.0 4.0 3.6 3.7 3.4*  CL 100 100 103 106 105  CO2 27 27 27 27 26   GLUCOSE 115* 140* 121* 109* 116*  BUN 9 10 9 7 7   CREATININE 0.63 0.78 0.68 0.59 0.64  CALCIUM 8.7 8.5 7.9* 7.8* 8.0*  MG  --  2.2  --  1.9  --   PHOS  --  3.0  --  3.9  --    Liver Function Tests:  Recent Labs Lab 10/17/14 0402  AST 42*  ALT 47*  ALKPHOS 97  BILITOT 0.7  PROT 5.4*  ALBUMIN 2.4*   No results for input(s): LIPASE, AMYLASE in the last 168 hours. No results for input(s): AMMONIA in the last 168 hours. CBC:  Recent Labs Lab 10/17/14 0402  10/18/14 0244 10/18/14 0929 10/19/14 0438 10/20/14 0602 10/20/14 1830 10/21/14 0313  WBC 9.5  < > 12.3* 12.7* 10.5 7.7 8.6 8.0  NEUTROABS 6.8  --  10.0*  --  8.0* 5.6  --  5.5  HGB 9.3*  < > 9.2* 9.0* 7.1* 6.3* 8.2* 7.9*  HCT 30.4*  < > 29.6* 29.0* 23.4* 21.0* 25.9* 25.9*  MCV 82.4  < > 81.5 82.2 82.4 83.7 83.8 83.8  PLT 258  < > 181 152 102* 155 161 176  < > = values in this interval not displayed. Cardiac Enzymes: No results for input(s): CKTOTAL, CKMB, CKMBINDEX, TROPONINI in the last 168 hours. BNP (last 3 results)  Recent Labs  09/27/14 1046 10/10/14 2052  BNP 379.7* 66.3    ProBNP (last 3 results) No results for input(s): PROBNP in the last 8760 hours.  CBG: No results for input(s): GLUCAP in the last 168 hours.  No results found for this or any  previous visit (from the past 240 hour(s)).   Studies: No results found.  Scheduled Meds: . sodium chloride   Intravenous Once  . acidophilus  1 capsule Oral Daily  . calcium carbonate  1,500 mg of elemental calcium Oral Q breakfast  . cholecalciferol  5,000 Units Oral Daily  . famotidine  20 mg Oral Daily  . hydrocortisone cream   Topical BID  . iron polysaccharides  150 mg Oral Daily  . multivitamin with minerals  1 tablet Oral Daily  . omega-3 acid ethyl esters  1 g Oral Daily  . sodium chloride  3 mL Intravenous Q12H  . sodium chloride  3 mL Intravenous Q12H  . vitamin C  250 mg Oral Daily  . vitamin E  100 Units Oral Daily   Continuous Infusions: . 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 If 7PM-7AM, please contact night-coverage at [email or web address], password [facility] 10/21/2014, 1:39 PM  LOS: 11 days

## 2014-10-22 DIAGNOSIS — D62 Acute posthemorrhagic anemia: Secondary | ICD-10-CM

## 2014-10-22 DIAGNOSIS — R609 Edema, unspecified: Secondary | ICD-10-CM

## 2014-10-22 LAB — CBC
HCT: 28.1 % — ABNORMAL LOW (ref 36.0–46.0)
Hemoglobin: 8.6 g/dL — ABNORMAL LOW (ref 12.0–15.0)
MCH: 25.7 pg — ABNORMAL LOW (ref 26.0–34.0)
MCHC: 30.6 g/dL (ref 30.0–36.0)
MCV: 83.9 fL (ref 78.0–100.0)
Platelets: 199 10*3/uL (ref 150–400)
RBC: 3.35 MIL/uL — AB (ref 3.87–5.11)
RDW: 22.3 % — ABNORMAL HIGH (ref 11.5–15.5)
WBC: 5.7 10*3/uL (ref 4.0–10.5)

## 2014-10-22 LAB — BASIC METABOLIC PANEL
ANION GAP: 9 (ref 5–15)
BUN: 7 mg/dL (ref 6–20)
CHLORIDE: 105 mmol/L (ref 101–111)
CO2: 27 mmol/L (ref 22–32)
Calcium: 8.6 mg/dL — ABNORMAL LOW (ref 8.9–10.3)
Creatinine, Ser: 0.6 mg/dL (ref 0.44–1.00)
GFR calc Af Amer: >60 mL/min (ref >60)
GFR calc non Af Amer: >60 mL/min (ref >60)
Glucose, Bld: 110 mg/dL — ABNORMAL HIGH (ref 70–99)
Potassium: 3.7 mmol/L (ref 3.5–5.1)
Sodium: 141 mmol/L (ref 135–145)

## 2014-10-22 LAB — HEPARIN LEVEL (UNFRACTIONATED)
HEPARIN UNFRACTIONATED: 0.59 [IU]/mL (ref 0.30–0.70)
HEPARIN UNFRACTIONATED: 0.53 [IU]/mL (ref 0.30–0.70)

## 2014-10-22 NOTE — Progress Notes (Signed)
ANTICOAGULATION CONSULT NOTE - Follow Up Consult  Pharmacy Consult:  Heparin Indication:  History of recent PE + new acute DVT  No Known Allergies  Patient Measurements: Height: 5\' 5"  (165.1 cm) Weight: 218 lb 4.1 oz (99 kg) IBW/kg (Calculated) : 57 Heparin Dosing Weight:  80 kg  Vital Signs: Temp: 98 F (36.7 C) (05/03 0412) Temp Source: Oral (05/03 0412) BP: 136/60 mmHg (05/03 0415) Pulse Rate: 66 (05/03 0415)  Labs:  Recent Labs  10/20/14 0602  10/20/14 1830 10/21/14 0313 10/21/14 1310 10/21/14 2320 10/22/14 0343  HGB 6.3*  --  8.2* 7.9*  --   --  8.6*  HCT 21.0*  --  25.9* 25.9*  --   --  28.1*  PLT 155  --  161 176  --   --  199  HEPARINUNFRC  --   < > 0.10* 0.28* 0.62 0.59 0.53  CREATININE 0.59  --   --  0.64  --   --  0.60  < > = values in this interval not displayed.  Estimated Creatinine Clearance: 74.1 mL/min (by C-G formula based on Cr of 0.6).   Assessment: [name] with history of recent PE on Xarelto but was discontinued due to periorbital ecchymoses, now with new acute DVT. Spoke with Maggie RN this morning- hematuria and bleeding from PICC with slight improvement, but still there. No worsening of hematomas this morning/overnight.  MD, RN, and PharmD are all aware of high bleed risk and monitoring closely.   Heparin level this morning remains in middle of goal at 0.53 on 1550 units/hr.  hgb stable at 8.6 after transfusion on 5/1, plts remain WNL.  Goal of Therapy:  Heparin level 0.3-0.7 units/ml, aim low Monitor platelets by anticoagulation protocol: Yes    Plan:  - Continue heparin at 1550 units/hr - Daily HL / CBC - Monitor for resolution/worsening of bleeding/hematoma - F/U long-term AC plan and Heme recommendations   Ellionna Buckbee D. Elan Brainerd, PharmD, BCPS Clinical Pharmacist Pager: [phone] 10/22/2014 8:17 AM

## 2014-10-22 NOTE — Progress Notes (Signed)
PROGRESS NOTE  [name] YKD:[id number] DOB: 11-03-1940 DOA: 10/10/2014 PCP: Osborne Casco, MD  Chart reviewed. 74 year old female with PMH as below. She was admitted to [facility] ED 09/27/14 with bilateral PE and received thrombolysis via EKOS on 4/9. Heprain gtt was eventually transitioned to xarelto, however, she developed periorbital ecchymosis. Xarelto was D/c'd and IVF filter was placed. She was discharged to home 4/14. 4/21 she again presented to [facility] c/o orthostasis and syncope. She was admitted and treated initially with IVF, which resulted in massive lower extremity edema. Echocardiogram has normalized since prior admission. Repeat CT showed improved PEs, bilateral LE DVTs. Venogram showed clot extending to IVC. DVTs lysed. Started on heparin gtt. Has had hematoma development in both arms, and has required blood transfusion.  Assessment/Plan:  Bilateral LE DVT and IVC thrombosis - On IV heparin -- holding oral anticoagulation for now. Platelet aggregation study and von Willebrand panel pending. Appreciate Dr. Azucena Freed assistance.  ABLA: transfused 5/1. Hemoglobin improved. Monitor.  Hematoma of b/l arms- appears about the same today.  AKI- resolved  HLD -not on statin  GERD  Hypokalemia: repleted  R breast cancer  Edema: Explained the reasoning for heart healthy diet. Patient agrees to continue.  Code Status: full Family Communication: Husband at bedside Disposition Plan:    Consultants:  Hematology  pulm  IR  Procedures:  Thrombolysis LE DVTs  PICC  venougram  HPI/Subjective: "are you the one that ordered a heart healthy diet?!" reporting blood tests not collected.  Objective: Filed Vitals:   10/22/14 1500  BP:   Pulse:   Temp: 98.2 F (36.8 C)  Resp:     Intake/Output Summary (Last 24 hours) at 10/22/14 1610 Last data filed at 10/22/14 0800  Gross per 24 hour  Intake 1677.38 ml  Output   1325 ml  Net 352.38 ml    Filed Weights   10/17/14 1635 10/21/14 1716 10/22/14 0500  Weight: 101 kg (222 lb 10.6 oz) 99.3 kg (218 lb 14.7 oz) 99 kg (218 lb 4.1 oz)    Exam:   General:  In bed watching TV. Sarcastic and hostile.  Cardiovascular: rrr without MGR  Respiratory: clear without WRR  Abdomen: +BS, S, NT, ND  ext: arms and legs edematous. Ecchymoses about the same.  Skin: echymoses noted  Data Reviewed: Basic Metabolic Panel:  Recent Labs Lab 10/18/14 0244 10/19/14 0438 10/20/14 0602 10/21/14 0313 10/22/14 0343  NA 135 137 139 139 141  K 4.0 3.6 3.7 3.4* 3.7  CL 100 103 106 105 105  CO2 27 27 27 26 27   GLUCOSE 140* 121* 109* 116* 110*  BUN 10 9 7 7 7   CREATININE 0.78 0.68 0.59 0.64 0.60  CALCIUM 8.5 7.9* 7.8* 8.0* 8.6*  MG 2.2  --  1.9  --   --   PHOS 3.0  --  3.9  --   --    Liver Function Tests:  Recent Labs Lab 10/17/14 0402  AST 42*  ALT 47*  ALKPHOS 97  BILITOT 0.7  PROT 5.4*  ALBUMIN 2.4*   No results for input(s): LIPASE, AMYLASE in the last 168 hours. No results for input(s): AMMONIA in the last 168 hours. CBC:  Recent Labs Lab 10/17/14 0402  10/18/14 0244  10/19/14 0438 10/20/14 0602 10/20/14 1830 10/21/14 0313 10/22/14 0343  WBC 9.5  < > 12.3*  < > 10.5 7.7 8.6 8.0 5.7  NEUTROABS 6.8  --  10.0*  --  8.0* 5.6  --  5.5  --  HGB 9.3*  < > 9.2*  < > 7.1* 6.3* 8.2* 7.9* 8.6*  HCT 30.4*  < > 29.6*  < > 23.4* 21.0* 25.9* 25.9* 28.1*  MCV 82.4  < > 81.5  < > 82.4 83.7 83.8 83.8 83.9  PLT 258  < > 181  < > 102* 155 161 176 199  < > = values in this interval not displayed. Cardiac Enzymes: No results for input(s): CKTOTAL, CKMB, CKMBINDEX, TROPONINI in the last 168 hours. BNP (last 3 results)  Recent Labs  09/27/14 1046 10/10/14 2052  BNP 379.7* 66.3    ProBNP (last 3 results) No results for input(s): PROBNP in the last 8760 hours.  CBG: No results for input(s): GLUCAP in the last 168 hours.  No results found for this or any previous visit  (from the past 240 hour(s)).   Studies: No results found.  Scheduled Meds: . acidophilus  1 capsule Oral Daily  . calcium carbonate  1,500 mg of elemental calcium Oral Q breakfast  . cholecalciferol  5,000 Units Oral Daily  . famotidine  20 mg Oral Daily  . hydrocortisone cream   Topical BID  . iron polysaccharides  150 mg Oral Daily  . multivitamin with minerals  1 tablet Oral Daily  . omega-3 acid ethyl esters  1 g Oral Daily  . vitamin C  250 mg Oral Daily  . vitamin E  100 Units Oral Daily   Continuous Infusions: . 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 If 7PM-7AM, please contact night-coverage at [email or web address], password [facility] 10/22/2014, 4:10 PM  LOS: 12 days

## 2014-10-22 NOTE — Progress Notes (Signed)
ANTICOAGULATION CONSULT NOTE  Pharmacy Consult:  Heparin Indication:  History of recent PE + new acute DVT  No Known Allergies  Patient Measurements: Height: 5\' 5"  (165.1 cm) Weight: 218 lb 14.7 oz (99.3 kg) IBW/kg (Calculated) : 57 Heparin Dosing Weight:  80 kg  Vital Signs: Temp: 98.2 F (36.8 C) (05/02 2312) Temp Source: Oral (05/02 2312) BP: 128/51 mmHg (05/02 1902) Pulse Rate: 90 (05/02 1902)  Labs:  Recent Labs  10/19/14 0438  10/20/14 0602  10/20/14 1830 10/21/14 0313 10/21/14 1310 10/21/14 2320  HGB 7.1*  --  6.3*  --  8.2* 7.9*  --   --   HCT 23.4*  --  21.0*  --  25.9* 25.9*  --   --   PLT 102*  --  155  --  161 176  --   --   HEPARINUNFRC  --   < >  --   < > 0.10* 0.28* 0.62 0.59  CREATININE 0.68  --  0.59  --   --  0.64  --   --   < > = values in this interval not displayed.  Estimated Creatinine Clearance: 74.2 mL/min (by C-G formula based on Cr of 0.64).  Assessment: 74 y.o. female with B DVT, h/o recent PE, for heparin  Goal of Therapy:  Heparin level 0.3-0.7 units/ml  Monitor platelets by anticoagulation protocol: Yes   Plan:  Continue Heparin at current rate for now Follow-up am labs.  Phillis Knack, PharmD, BCPS  10/22/2014, 12:30 AM

## 2014-10-23 LAB — TYPE AND SCREEN
ABO/RH(D): O POS
Antibody Screen: NEGATIVE
UNIT DIVISION: 0
Unit division: 0

## 2014-10-23 LAB — HEPARIN LEVEL (UNFRACTIONATED)
Heparin Unfractionated: 0.84 IU/mL — ABNORMAL HIGH (ref 0.30–0.70)
Heparin Unfractionated: 0.68 IU/mL (ref 0.30–0.70)

## 2014-10-23 LAB — PLATELET AGGREGATION STUDY, BLOOD
ADP10: NORMAL
ADP5: NORMAL
ARACHIDONIC ACID: NORMAL
COMMENT-PLTAGG: NORMAL
Collagen Aggregation: NORMAL
Epinephrine 10: NORMAL pg/mL (ref 10–200)
Ristocetin: NORMAL
THROMBIN RECEPTOR: NORMAL

## 2014-10-23 LAB — CBC
HCT: 33.5 % — ABNORMAL LOW (ref 36.0–46.0)
HEMOGLOBIN: 10.1 g/dL — AB (ref 12.0–15.0)
MCH: 25.6 pg — ABNORMAL LOW (ref 26.0–34.0)
MCHC: 30.1 g/dL (ref 30.0–36.0)
MCV: 85 fL (ref 78.0–100.0)
Platelets: 324 10*3/uL (ref 150–400)
RBC: 3.94 MIL/uL (ref 3.87–5.11)
RDW: 22.6 % — ABNORMAL HIGH (ref 11.5–15.5)
WBC: 7.5 10*3/uL (ref 4.0–10.5)

## 2014-10-23 LAB — HEMOGLOBIN FREE, PLASMA: Hgb, Plasma: 9.8 mg/dL — ABNORMAL HIGH (ref <8.4)

## 2014-10-23 MED ORDER — HEPARIN (PORCINE) IN NACL 100-0.45 UNIT/ML-% IJ SOLN
1250.0000 [IU]/h | INTRAMUSCULAR | Status: DC
  Administered 2014-10-23 (×2): 1350 [IU]/h via INTRAVENOUS
  Administered 2014-10-24 – 2014-10-25 (×2): 1250 [IU]/h via INTRAVENOUS
  Filled 2014-10-23 (×4): qty 250

## 2014-10-23 MED ORDER — COUMADIN BOOK
Freq: Once | Status: AC
  Administered 2014-10-23: 1
  Filled 2014-10-23: qty 1

## 2014-10-23 MED ORDER — WARFARIN - PHARMACIST DOSING INPATIENT
Freq: Every day | Status: DC
  Administered 2014-10-23 – 2014-10-24 (×2)

## 2014-10-23 MED ORDER — WARFARIN VIDEO
Freq: Once | Status: AC
  Administered 2014-10-23: 1

## 2014-10-23 MED ORDER — WARFARIN SODIUM 5 MG PO TABS
5.0000 mg | ORAL_TABLET | Freq: Once | ORAL | Status: AC
  Administered 2014-10-23: 5 mg via ORAL
  Filled 2014-10-23: qty 1

## 2014-10-23 MED ORDER — WARFARIN SODIUM 7.5 MG PO TABS
7.5000 mg | ORAL_TABLET | Freq: Once | ORAL | Status: DC
  Filled 2014-10-23: qty 1

## 2014-10-23 NOTE — Progress Notes (Addendum)
ANTICOAGULATION CONSULT NOTE - Follow Up Consult  Pharmacy Consult:  Heparin and coumadin Indication:  History of recent PE + new acute DVT  No Known Allergies  Patient Measurements: Height: 5\' 5"  (165.1 cm) Weight: 207 lb 12.8 oz (94.257 kg) IBW/kg (Calculated) : 57 Heparin Dosing Weight:  80 kg  Vital Signs: Temp: 98.7 F (37.1 C) (05/04 1900) Temp Source: Oral (05/04 1900) BP: 103/66 mmHg (05/04 1621) Pulse Rate: 104 (05/04 1151)  Labs:  Recent Labs  10/21/14 0313  10/22/14 0343 10/23/14 0738 10/23/14 0849 10/23/14 1932  HGB 7.9*  --  8.6*  --  10.1*  --   HCT 25.9*  --  28.1*  --  33.5*  --   PLT 176  --  199  --  324  --   HEPARINUNFRC 0.28*  < > 0.53 0.84*  --  0.68  CREATININE 0.64  --  0.60  --   --   --   < > = values in this interval not displayed.  Estimated Creatinine Clearance: 72.1 mL/min (by C-G formula based on Cr of 0.6).   Assessment: 82 YOF with history of recent PE on Xarelto but was discontinued due to periorbital ecchymoses, now with new acute DVT. MD, RN, and PharmD are all aware of high bleed risk and monitoring closely. Arm hematomas have "flattened out", have not increased in size. Heparin level 0.68 after rate adjustment this am.  Plan to start coumadin tonight   Goal of Therapy:  Heparin level 0.3-0.7 units/ml, aim low Monitor platelets by anticoagulation protocol: Yes    Plan:  - Decrease heparin to 1350 units/hr - Coumadin 5 mg po tonight - Daily HL / CBC/ Pt/INR - Monitor for resolution/worsening of bleeding/hematoma  Thanks for allowing pharmacy to be a part of this patient's care.  Excell Seltzer, PharmD Clinical Pharmacist, [phone]

## 2014-10-23 NOTE — Progress Notes (Signed)
PT Cancellation Note  Patient Details Name: [name] MRN: [id number] DOB: 27-Mar-1941   Cancelled Treatment:    Reason Eval/Treat Not Completed: Patient declined, no reason specified.  Pt stated she had been to the [facility]: [name] and confirmed that she wasn't getting up with me, to [facility] was enough even when stressed the importance of getting up.  Will try back as able. 10/23/2014  [name], [place] [phone]  (pager)   [name], [name] 10/23/2014, 10:43 AM

## 2014-10-23 NOTE — Progress Notes (Signed)
PROGRESS NOTE  [name] NAT:[id number] DOB: [birth year] DOA: 10/10/2014 PCP: Osborne Casco, MD  Chart reviewed. 74 year old female with PMH as below. She was admitted to [facility] ED 09/27/14 with bilateral PE and received thrombolysis via EKOS on 4/9. Heprain gtt was eventually transitioned to xarelto, however, she developed periorbital ecchymosis. Xarelto was D/c'd and IVF filter was placed. She was discharged to home 4/14. 4/21 she again presented to [facility] c/o orthostasis and syncope. She was admitted and treated initially with IVF, which resulted in massive lower extremity edema. Echocardiogram has normalized since prior admission. Repeat CT showed improved PEs, bilateral LE DVTs. Venogram showed clot extending to IVC. DVTs lysed. Started on heparin gtt. Has had hematoma development in both arms, and has required blood transfusion.  Assessment/Plan:  Bilateral LE DVT and IVC thrombosis - On IV heparin -- Platelet aggregation study and von Willebrand panel pending. Discussed with Dr. Beryle Beams.  He recommends starting warfarin. Will ask pharmacy to dose.  H/h stable will transfer to medsurg  ABLA: transfused 5/1. Hemoglobin continues to improve  Hematoma of b/l arms- appears about the same today.  AKI- resolved  HLD -not on statin  GERD  Hypokalemia: repleted  R breast cancer  Edema: weight decreasing  Code Status: full Family Communication: Husband at bedside Disposition Plan: home once coumadin therapeutic and no further bleeding   Consultants:  Hematology  pulm  IR  Procedures:  Thrombolysis LE DVTs  PICC  venogram  HPI/Subjective: Feels better. No bleeding.   Objective: Filed Vitals:   10/23/14 1151  BP: 124/64  Pulse: 104  Temp:   Resp: 21    Intake/Output Summary (Last 24 hours) at 10/23/14 1552 Last data filed at 10/23/14 1530  Gross per 24 hour  Intake 1376.21 ml  Output   2375 ml  Net -998.79 ml   Filed Weights   10/21/14 1716 10/22/14 0500 10/23/14 1001  Weight: 99.3 kg (218 lb 14.7 oz) 99 kg (218 lb 4.1 oz) 94.257 kg (207 lb 12.8 oz)    Exam:   General:  In chair. Less sarcastic today.  Cardiovascular: rrr without MGR  Respiratory: clear without WRR  Abdomen: +BS, S, NT, ND  ext: arms and legs edematous. Ecchymoses about the same.  Skin: echymoses noted  Data Reviewed: Basic Metabolic Panel:  Recent Labs Lab 10/18/14 0244 10/19/14 0438 10/20/14 0602 10/21/14 0313 10/22/14 0343  NA 135 137 139 139 141  K 4.0 3.6 3.7 3.4* 3.7  CL 100 103 106 105 105  CO2 27 27 27 26 27   GLUCOSE 140* 121* 109* 116* 110*  BUN 10 9 7 7 7   CREATININE 0.78 0.68 0.59 0.64 0.60  CALCIUM 8.5 7.9* 7.8* 8.0* 8.6*  MG 2.2  --  1.9  --   --   PHOS 3.0  --  3.9  --   --    Liver Function Tests:  Recent Labs Lab 10/17/14 0402  AST 42*  ALT 47*  ALKPHOS 97  BILITOT 0.7  PROT 5.4*  ALBUMIN 2.4*   No results for input(s): LIPASE, AMYLASE in the last 168 hours. No results for input(s): AMMONIA in the last 168 hours. CBC:  Recent Labs Lab 10/17/14 0402  10/18/14 0244  10/19/14 0438 10/20/14 0602 10/20/14 1830 10/21/14 0313 10/22/14 0343 10/23/14 0849  WBC 9.5  < > 12.3*  < > 10.5 7.7 8.6 8.0 5.7 7.5  NEUTROABS 6.8  --  10.0*  --  8.0* 5.6  --  5.5  --   --  HGB 9.3*  < > 9.2*  < > 7.1* 6.3* 8.2* 7.9* 8.6* 10.1*  HCT 30.4*  < > 29.6*  < > 23.4* 21.0* 25.9* 25.9* 28.1* 33.5*  MCV 82.4  < > 81.5  < > 82.4 83.7 83.8 83.8 83.9 85.0  PLT 258  < > 181  < > 102* 155 161 176 199 324  < > = values in this interval not displayed. Cardiac Enzymes: No results for input(s): CKTOTAL, CKMB, CKMBINDEX, TROPONINI in the last 168 hours. BNP (last 3 results)  Recent Labs  09/27/14 1046 10/10/14 2052  BNP 379.7* 66.3    ProBNP (last 3 results) No results for input(s): PROBNP in the last 8760 hours.  CBG: No results for input(s): GLUCAP in the last 168 hours.  No results found for this or any  previous visit (from the past 240 hour(s)).   Studies: No results found.  Scheduled Meds: . acidophilus  1 capsule Oral Daily  . calcium carbonate  1,500 mg of elemental calcium Oral Q breakfast  . cholecalciferol  5,000 Units Oral Daily  . famotidine  20 mg Oral Daily  . hydrocortisone cream   Topical BID  . iron polysaccharides  150 mg Oral Daily  . multivitamin with minerals  1 tablet Oral Daily  . omega-3 acid ethyl esters  1 g Oral Daily  . vitamin C  250 mg Oral Daily  . vitamin E  100 Units Oral Daily   Continuous Infusions: . 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 If 7PM-7AM, please contact night-coverage at [email or web address], password [facility] 10/23/2014, 3:52 PM  LOS: 13 days

## 2014-10-23 NOTE — Progress Notes (Signed)
ANTICOAGULATION CONSULT NOTE - Follow Up Consult  Pharmacy Consult:  Heparin Indication:  History of recent PE + new acute DVT  No Known Allergies  Patient Measurements: Height: 5\' 5"  (165.1 cm) Weight: 207 lb 12.8 oz (94.257 kg) IBW/kg (Calculated) : 57 Heparin Dosing Weight:  80 kg  Vital Signs: Temp: 98 F (36.7 C) (05/04 0821) Temp Source: Oral (05/04 0821) BP: 138/63 mmHg (05/04 0821) Pulse Rate: 103 (05/04 0821)  Labs:  Recent Labs  10/21/14 0313  10/21/14 2320 10/22/14 0343 10/23/14 0738 10/23/14 0849  HGB 7.9*  --   --  8.6*  --  10.1*  HCT 25.9*  --   --  28.1*  --  33.5*  PLT 176  --   --  199  --  324  HEPARINUNFRC 0.28*  < > 0.59 0.53 0.84*  --   CREATININE 0.64  --   --  0.60  --   --   < > = values in this interval not displayed.  Estimated Creatinine Clearance: 72.1 mL/min (by C-G formula based on Cr of 0.6).   Assessment: 31 YOF with history of recent PE on Xarelto but was discontinued due to periorbital ecchymoses, now with new acute DVT. MD, RN, and PharmD are all aware of high bleed risk and monitoring closely. Per RN, bilateral arm hematomas seemed to have improved and no other obvious signs of bleeding noted.   Heparin level this morning trended up to 0.84 on 1550 units/hr despite being therapeutic previously. Inquired with RN and patient who both agree that the lab was drawn correctly.    hgb up to 10.1 after transfusion on 5/1, plts trended up and remain WNL.  Goal of Therapy:  Heparin level 0.3-0.7 units/ml, aim low Monitor platelets by anticoagulation protocol: Yes    Plan:  - Hold heparin infusion x 1 hour, then resume at lower rate of 1450 units/hr - F/u 8 hr HL - Daily HL / CBC - Monitor for resolution/worsening of bleeding/hematoma - F/U long-term AC plan and Heme recommendations   Albertina Parr, PharmD., BCPS Clinical Pharmacist Pager [phone]

## 2014-10-24 LAB — BASIC METABOLIC PANEL
ANION GAP: 7 (ref 5–15)
BUN: 6 mg/dL (ref 6–20)
CALCIUM: 8.9 mg/dL (ref 8.9–10.3)
CO2: 29 mmol/L (ref 22–32)
Chloride: 106 mmol/L (ref 101–111)
Creatinine, Ser: 0.73 mg/dL (ref 0.44–1.00)
GFR calc Af Amer: >60 mL/min (ref >60)
Glucose, Bld: 112 mg/dL — ABNORMAL HIGH (ref 70–99)
POTASSIUM: 4 mmol/L (ref 3.5–5.1)
Sodium: 142 mmol/L (ref 135–145)

## 2014-10-24 LAB — HEMOSIDERIN, URINE

## 2014-10-24 LAB — VON WILLEBRAND PANEL
COAGULATION FACTOR VIII: 180 % — AB (ref 50–150)
Ristocetin Co-factor, Plasma: >390 % — ABNORMAL HIGH (ref 50–150)

## 2014-10-24 LAB — CBC
HCT: 29.5 % — ABNORMAL LOW (ref 36.0–46.0)
Hemoglobin: 8.9 g/dL — ABNORMAL LOW (ref 12.0–15.0)
MCH: 26.1 pg (ref 26.0–34.0)
MCHC: 30.2 g/dL (ref 30.0–36.0)
MCV: 86.5 fL (ref 78.0–100.0)
PLATELETS: 291 10*3/uL (ref 150–400)
RBC: 3.41 MIL/uL — ABNORMAL LOW (ref 3.87–5.11)
RDW: 22.8 % — AB (ref 11.5–15.5)
WBC: 6.9 10*3/uL (ref 4.0–10.5)

## 2014-10-24 LAB — COAG STUDIES INTERP REPORT: PDF Image: 0

## 2014-10-24 LAB — PROTIME-INR
INR: 1.07 (ref 0.00–1.49)
Prothrombin Time: 14.1 seconds (ref 11.6–15.2)

## 2014-10-24 LAB — HEPARIN LEVEL (UNFRACTIONATED)
HEPARIN UNFRACTIONATED: 0.52 [IU]/mL (ref 0.30–0.70)
Heparin Unfractionated: 0.63 IU/mL (ref 0.30–0.70)

## 2014-10-24 MED ORDER — WARFARIN SODIUM 5 MG PO TABS
5.0000 mg | ORAL_TABLET | Freq: Once | ORAL | Status: AC
  Administered 2014-10-24: 5 mg via ORAL
  Filled 2014-10-24 (×2): qty 1

## 2014-10-24 NOTE — Progress Notes (Signed)
Patient is transferred from 3S to room 4N25 at this time. Alert and in stable condition.

## 2014-10-24 NOTE — Progress Notes (Signed)
Physical Therapy Treatment Patient Details Name: [name] MRN: [id number] DOB: May 27, 1941 Today's Date: 10/24/2014    History of Present Illness Patient is a 74 yo female admitted 10/10/14 following syncopal episode.  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  PMH:  HTN, HLD, breast CA, IVC filter, fibromyalgia, depression    PT Comments    Progressing slowly due to self limitations and tachycardia when ambulating.  EHR up to 137 bpm when walking in the halls up from RHR of ~100 bpm.  Follow Up Recommendations  No PT follow up;Supervision - Intermittent     Equipment Recommendations       Recommendations for Other Services       Precautions / Restrictions Precautions Precautions: Fall    Mobility  Bed Mobility                  Transfers Overall transfer level: Needs assistance Equipment used: Rolling walker (2 wheeled) Transfers: Sit to/from Stand Sit to Stand: Min guard         General transfer comment: Verbal cues for hand placement.  Supervision for safety only.  Ambulation/Gait Ambulation/Gait assistance: Supervision Ambulation Distance (Feet): 140 Feet Assistive device: Rolling walker (2 wheeled) Gait Pattern/deviations: Step-through pattern Gait velocity: Decreased Gait velocity interpretation: Below normal speed for age/gender General Gait Details: less guarded, generally steady.  Good use of the RW   Stairs            Wheelchair Mobility    Modified Rankin (Stroke Patients Only)       Balance Overall balance assessment: No apparent balance deficits (not formally assessed)   Sitting balance-Leahy Scale: Fair     Standing balance support: No upper extremity supported Standing balance-Leahy Scale: Fair                      Cognition Arousal/Alertness: Awake/alert Behavior During Therapy: WFL for tasks assessed/performed Overall Cognitive Status: Within Functional Limits for tasks assessed                      Exercises      General Comments General comments (skin integrity, edema, etc.): RHR in th 100 bpm at rest; EHR up to 137 and assymptomatic.      Pertinent Vitals/Pain Pain Assessment: Faces Faces Pain Scale: Hurts little more Pain Location: L arm and back of legs Pain Descriptors / Indicators: Aching;Sharp Pain Intervention(s): Monitored during session    Home Living                      Prior Function            PT Goals (current goals can now be found in the care plan section) Acute Rehab PT Goals Time For Goal Achievement: 10/29/14 Potential to Achieve Goals: Good Progress towards PT goals: Progressing toward goals    Frequency  Min 3X/week    PT Plan Current plan remains appropriate    Co-evaluation             End of Session   Activity Tolerance: Patient limited by pain Patient left: in chair;with call bell/phone within reach     Time: 1053-1109 PT Time Calculation (min) (ACUTE ONLY): 16 min  Charges:  $Gait Training: 8-22 mins                    G Codes:      Cicily Bonano, [name] 10/24/2014, 12:19 PM  10/24/2014  [name], [place] [phone]  (pager)

## 2014-10-24 NOTE — Progress Notes (Signed)
Patient ID: [name], female   DOB: 08/02/1940, 74 y.o.   MRN: [id number] Hematology Excellent result with thrombolysis no residual clot on filter or right iliac vein system; minor residual on left but good venous blood flow. Platelet function studies are normal, including assay for ristocetin activity, therefore, I anticipate Von Willebrand profile will also be normal. Warfarin started. Hemoglobin still fluctuating. 8.9 today c/w 10.1 5/4 At this point it appears most likely explanation for soft tissue hematoma formation was related to sensitivity to thrombolytic agents as it occurred coincidentally with both treatments. Increased bleeding noted at time of procedure as fibrinogen fell. I would like to see Hb stable for at least 48 hours before discharge. Early removal of IVC filter within next month or so. She will need long term anticoagulation. I will leave choice of estrogen receptor modulator to Dr Lindi Adie.  Aromatase inhibitors are less thrombogenic than tamoxifen but there is no contraindication to tamoxifen as long as she is anticoagulated. Above reviewed with patient at bedside.

## 2014-10-24 NOTE — Progress Notes (Signed)
ANTICOAGULATION CONSULT NOTE - Follow Up Consult  Pharmacy Consult:  Heparin and coumadin Indication:  History of recent PE + new acute DVT  No Known Allergies  Patient Measurements: Height: 5\' 5"  (165.1 cm) Weight: 207 lb 3.7 oz (94 kg) IBW/kg (Calculated) : 57 Heparin Dosing Weight:  80 kg  Vital Signs: Temp: 98.4 F (36.9 C) (05/05 1648) Temp Source: Oral (05/05 1648) BP: 132/62 mmHg (05/05 1648) Pulse Rate: 102 (05/05 1648)  Labs:  Recent Labs  10/22/14 0343  10/23/14 0849 10/23/14 1932 10/24/14 0305 10/24/14 1647  HGB 8.6*  --  10.1*  --  8.9*  --   HCT 28.1*  --  33.5*  --  29.5*  --   PLT 199  --  324  --  291  --   LABPROT  --   --   --   --  14.1  --   INR  --   --   --   --  1.07  --   HEPARINUNFRC 0.53  < >  --  0.68 0.63 0.52  CREATININE 0.60  --   --   --  0.73  --   < > = values in this interval not displayed.  Estimated Creatinine Clearance: 72 mL/min (by C-G formula based on Cr of 0.73).   Assessment: 76 YOF with history of recent PE on Xarelto but was discontinued due to periorbital ecchymoses, now with new acute DVT. MD, RN, and PharmD are all aware of high bleed risk and monitoring closely. Arm hematomas are stable.   -Heparin level is 0.52 after decrease to 1250 units/hr  Goal of Therapy:  Heparin level 0.3-0.7 units/ml, lower end of range preferred Monitor platelets by anticoagulation protocol: Yes    Plan:  -No heparin changes now - Daily HL / CBC - Monitor for resolution/worsening of bleeding/hematoma  Hildred Laser, Pharm D 10/24/2014 6:14 PM

## 2014-10-24 NOTE — Progress Notes (Signed)
PROGRESS NOTE  [name] CHE:[id number] DOB: 11-09-1940 DOA: 10/10/2014 PCP: Osborne Casco, MD  Chart reviewed. 74 year old female with PMH as below. She was admitted to [facility] ED 09/27/14 with bilateral PE and received thrombolysis via EKOS on 4/9. Heprain gtt was eventually transitioned to xarelto, however, she developed periorbital ecchymosis. Xarelto was D/c'd and IVF filter was placed. She was discharged to home 4/14. 4/21 she again presented to [facility] c/o orthostasis and syncope. She was admitted and treated initially with IVF, which resulted in massive lower extremity edema. Echocardiogram has normalized since prior admission. Repeat CT showed improved PEs, bilateral LE DVTs. Venogram showed clot extending to IVC. DVTs lysed. Started on heparin gtt. Has had hematoma development in both arms, and has required blood transfusion.  Assessment/Plan:  Bilateral LE DVT and IVC thrombosis - On IV heparin -- Platelet aggregation study and von Willebrand panel ok. Continue heparin and coumadin. Monitor h/h  ABLA: transfused 5/1. Hemoglobin continues to improve  Hematoma of b/l arms- appears about the same today.  AKI- resolved  HLD -not on statin  GERD  Hypokalemia: repleted  R breast cancer  Edema: weight decreasing  Code Status: full Family Communication: Husband at bedside Disposition Plan: home once coumadin therapeutic and no further bleeding   Consultants:  Hematology  pulm  IR  Procedures:  Thrombolysis LE DVTs  PICC  venogram  HPI/Subjective: No new complaints  Objective: Filed Vitals:   10/24/14 1648  BP: 132/62  Pulse: 102  Temp: 98.4 F (36.9 C)  Resp: 18    Intake/Output Summary (Last 24 hours) at 10/24/14 1755 Last data filed at 10/24/14 0745  Gross per 24 hour  Intake 1105.6 ml  Output      0 ml  Net 1105.6 ml   Filed Weights   10/22/14 0500 10/23/14 1001 10/24/14 0500  Weight: 99 kg (218 lb 4.1 oz) 94.257 kg (207 lb  12.8 oz) 94 kg (207 lb 3.7 oz)    Exam:  Being transported up to medsurg. A and o.  Data Reviewed: Basic Metabolic Panel:  Recent Labs Lab 10/18/14 0244 10/19/14 0438 10/20/14 0602 10/21/14 0313 10/22/14 0343 10/24/14 0305  NA 135 137 139 139 141 142  K 4.0 3.6 3.7 3.4* 3.7 4.0  CL 100 103 106 105 105 106  CO2 27 27 27 26 27 29   GLUCOSE 140* 121* 109* 116* 110* 112*  BUN 10 9 7 7 7 6   CREATININE 0.78 0.68 0.59 0.64 0.60 0.73  CALCIUM 8.5 7.9* 7.8* 8.0* 8.6* 8.9  MG 2.2  --  1.9  --   --   --   PHOS 3.0  --  3.9  --   --   --    Liver Function Tests: No results for input(s): AST, ALT, ALKPHOS, BILITOT, PROT, ALBUMIN in the last 168 hours. No results for input(s): LIPASE, AMYLASE in the last 168 hours. No results for input(s): AMMONIA in the last 168 hours. CBC:  Recent Labs Lab 10/18/14 0244  10/19/14 0438 10/20/14 0602 10/20/14 1830 10/21/14 0313 10/22/14 0343 10/23/14 0849 10/24/14 0305  WBC 12.3*  < > 10.5 7.7 8.6 8.0 5.7 7.5 6.9  NEUTROABS 10.0*  --  8.0* 5.6  --  5.5  --   --   --   HGB 9.2*  < > 7.1* 6.3* 8.2* 7.9* 8.6* 10.1* 8.9*  HCT 29.6*  < > 23.4* 21.0* 25.9* 25.9* 28.1* 33.5* 29.5*  MCV 81.5  < > 82.4 83.7 83.8 83.8  83.9 85.0 86.5  PLT 181  < > 102* 155 161 176 199 324 291  < > = values in this interval not displayed. Cardiac Enzymes: No results for input(s): CKTOTAL, CKMB, CKMBINDEX, TROPONINI in the last 168 hours. BNP (last 3 results)  Recent Labs  09/27/14 1046 10/10/14 2052  BNP 379.7* 66.3    ProBNP (last 3 results) No results for input(s): PROBNP in the last 8760 hours.  CBG: No results for input(s): GLUCAP in the last 168 hours.  No results found for this or any previous visit (from the past 240 hour(s)).   Studies: No results found.  Scheduled Meds: . acidophilus  1 capsule Oral Daily  . calcium carbonate  1,500 mg of elemental calcium Oral Q breakfast  . cholecalciferol  5,000 Units Oral Daily  . famotidine  20 mg Oral  Daily  . hydrocortisone cream   Topical BID  . iron polysaccharides  150 mg Oral Daily  . multivitamin with minerals  1 tablet Oral Daily  . omega-3 acid ethyl esters  1 g Oral Daily  . vitamin C  250 mg Oral Daily  . vitamin E  100 Units Oral Daily  . warfarin  5 mg Oral ONCE-1800  . Warfarin - Pharmacist Dosing Inpatient   Does not apply q1800   Continuous Infusions: . 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 If 7PM-7AM, please contact night-coverage at [email or web address], password [facility] 10/24/2014, 5:55 PM  LOS: 14 days

## 2014-10-24 NOTE — Progress Notes (Signed)
Patient to transfer to 4N25 report given to receiving nurse all questions answered at this time.  Pt. VSS with no s/s of distress noted.  Patient stable at transfer.

## 2014-10-24 NOTE — Progress Notes (Signed)
ANTICOAGULATION CONSULT NOTE - Follow Up Consult  Pharmacy Consult:  Heparin and coumadin Indication:  History of recent PE + new acute DVT  No Known Allergies  Patient Measurements: Height: 5\' 5"  (165.1 cm) Weight: 207 lb 3.7 oz (94 kg) IBW/kg (Calculated) : 57 Heparin Dosing Weight:  80 kg  Vital Signs: Temp: 97.7 F (36.5 C) (05/05 0700) Temp Source: Oral (05/05 0700) BP: 128/54 mmHg (05/05 0340) Pulse Rate: 86 (05/05 0340)  Labs:  Recent Labs  10/22/14 0343 10/23/14 0738 10/23/14 0849 10/23/14 1932 10/24/14 0305  HGB 8.6*  --  10.1*  --  8.9*  HCT 28.1*  --  33.5*  --  29.5*  PLT 199  --  324  --  291  LABPROT  --   --   --   --  14.1  INR  --   --   --   --  1.07  HEPARINUNFRC 0.53 0.84*  --  0.68 0.63  CREATININE 0.60  --   --   --  0.73    Estimated Creatinine Clearance: 72 mL/min (by C-G formula based on Cr of 0.73).   Assessment: 87 YOF with history of recent PE on Xarelto but was discontinued due to periorbital ecchymoses, now with new acute DVT. MD, RN, and PharmD are all aware of high bleed risk and monitoring closely. Arm hematomas are stable. Heparin level 0.63 this am and INR 1.07. Hg stable.   Goal of Therapy:  Heparin level 0.3-0.7 units/ml, lower end of range preferred Monitor platelets by anticoagulation protocol: Yes    Plan:  - Decrease heparin to 1250 units/hr -Check HL later today - Coumadin 5 mg po tonight - Daily HL / CBC/ Pt/INR - Monitor for resolution/worsening of bleeding/hematoma  Thanks for allowing pharmacy to be a part of this patient's care.  Excell Seltzer, PharmD Clinical Pharmacist, [phone]

## 2014-10-25 LAB — PROTIME-INR
INR: 1.99 — AB (ref 0.00–1.49)
PROTHROMBIN TIME: 22.8 s — AB (ref 11.6–15.2)

## 2014-10-25 LAB — HEPARIN LEVEL (UNFRACTIONATED): Heparin Unfractionated: 0.31 IU/mL (ref 0.30–0.70)

## 2014-10-25 MED ORDER — COUMADIN BOOK
Freq: Once | Status: AC
  Administered 2014-10-25: 10:00:00
  Filled 2014-10-25: qty 1

## 2014-10-25 NOTE — Progress Notes (Signed)
Physical Therapy Treatment Patient Details Name: [name] MRN: [id number] DOB: 1940-11-30 Today's Date: 10/25/2014    History of Present Illness Patient is a 74 yo female admitted 10/10/14 following syncopal episode.  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  PMH:  HTN, HLD, breast CA, IVC filter, fibromyalgia, depression    PT Comments    Continues to make good progress towards physical therapy goals, ambulating 210 feet this afternoon. HR did elevate to 130s, SpO2 on room air ranged from 93-97%, with mild dyspnea noted. States she has been compliant with gentle ROM exercises. Will continue to follow and progress functionally until d/c.  Follow Up Recommendations  No PT follow up;Supervision - Intermittent     Equipment Recommendations  Other (comment) (TBD)    Recommendations for Other Services       Precautions / Restrictions Precautions Precautions: Fall Restrictions Weight Bearing Restrictions: No    Mobility  Bed Mobility                  Transfers Overall transfer level: Needs assistance Equipment used: Rolling walker (2 wheeled) Transfers: Sit to/from Stand Sit to Stand: Supervision         General transfer comment: Supervision for safety. Good stability once holding RW for support.  Ambulation/Gait Ambulation/Gait assistance: Supervision Ambulation Distance (Feet): 210 Feet Assistive device: Rolling walker (2 wheeled) Gait Pattern/deviations: Step-through pattern;Decreased stride length;Trunk flexed Gait velocity: Decreased   General Gait Details: Slightly guarded with good step though pattern. VC for upright posture. No loss of balance, demonstrates good control of RW with turns. No rest break required. does complain of posterior knee pain and LUE pain.   Stairs            Wheelchair Mobility    Modified Rankin (Stroke Patients Only)       Balance                                    Cognition  Arousal/Alertness: Awake/alert Behavior During Therapy: WFL for tasks assessed/performed Overall Cognitive Status: Within Functional Limits for tasks assessed                      Exercises General Exercises - Lower Extremity Ankle Circles/Pumps: AROM;Both;10 reps;Seated Quad Sets: AROM;Both;5 reps;Seated Long Arc Quad: AROM;Both;5 reps;Seated    General Comments General comments (skin integrity, edema, etc.): HR elevated to 130s while ambulating. Slight SOB with SpO2 93-97% on room air during bout.      Pertinent Vitals/Pain Pain Assessment: 0-10 Pain Score: 7  Pain Location: LUE, BIL LEs popliteal space Pain Descriptors / Indicators: Aching (ice pick) Pain Intervention(s): Monitored during session;Repositioned    Home Living                      Prior Function            PT Goals (current goals can now be found in the care plan section) Acute Rehab PT Goals PT Goal Formulation: With patient Time For Goal Achievement: 10/29/14 Potential to Achieve Goals: Good Progress towards PT goals: Progressing toward goals    Frequency  Min 3X/week    PT Plan Current plan remains appropriate    Co-evaluation             End of Session Equipment Utilized During Treatment: Gait belt Activity Tolerance: Patient tolerated treatment well Patient left:  in chair;with call bell/phone within reach;with family/visitor present     Time: 2761-8485 PT Time Calculation (min) (ACUTE ONLY): 15 min  Charges:  $Gait Training: 8-22 mins                    G Codes:      Ellouise Newer 2014-10-31, 3:10 PM Camille Bal [place], [place]

## 2014-10-25 NOTE — Progress Notes (Signed)
ANTICOAGULATION CONSULT NOTE - Follow Up Consult  Pharmacy Consult:  Heparin and coumadin Indication:  History of recent PE + new acute DVT  No Known Allergies  Patient Measurements: Height: 5\' 5"  (165.1 cm) Weight: 204 lb 11.2 oz (92.851 kg) IBW/kg (Calculated) : 57 Heparin Dosing Weight:  80 kg  Vital Signs: Temp: 98.5 F (36.9 C) (05/06 1329) Temp Source: Oral (05/06 1329) BP: 140/59 mmHg (05/06 1329) Pulse Rate: 103 (05/06 1329)  Labs:  Recent Labs  10/23/14 0849  10/24/14 0305 10/24/14 1647 10/25/14 1312  HGB 10.1*  --  8.9*  --   --   HCT 33.5*  --  29.5*  --   --   PLT 324  --  291  --   --   LABPROT  --   --  14.1  --  22.8*  INR  --   --  1.07  --  1.99*  HEPARINUNFRC  --   < > 0.63 0.52 0.31  CREATININE  --   --  0.73  --   --   < > = values in this interval not displayed.  Estimated Creatinine Clearance: 71.6 mL/min (by C-G formula based on Cr of 0.73).   Assessment: 74 YOF with history of recent PE on Xarelto but was discontinued due to periorbital ecchymoses, now with new acute DVT. Pt is also s/p EKOS protocol to lower extremity DVTs. Pt has IVC filter. She has had intermittent hematuria throughout the admission. MD, RN, and PharmD are all aware of high bleed risk and monitoring closely. Arm and lower extremity hematomas are stable. Warfarin was started on 5/4, pt appears to be very sensitive, INR jumped from 1.07 > 1.99 after only two doses. HL is therapeutic on low end of range. Hgb 8.9, plts wnl.   Goal of Therapy:  Heparin level 0.3-0.7 units/ml, lower end of range preferred Monitor platelets by anticoagulation protocol: Yes    Plan:  -Continue heparin at 1250 units/hr -Hold warfarin tonight given large jump in INR -Daily HL, CBC, INR -Monitor bleeding   Hughes Better, PharmD, BCPS Clinical Pharmacist Pager: [phone] 10/25/2014 2:31 PM

## 2014-10-25 NOTE — Progress Notes (Signed)
PROGRESS NOTE  Patrick Sohm KXF:[id number] DOB: 11-17-1940 DOA: 10/10/2014 PCP: Osborne Casco, MD  Chart reviewed. 74 year old female with PMH as below. She was admitted to [facility] ED 09/27/14 with bilateral PE and received thrombolysis via EKOS on 4/9. Heprain gtt was eventually transitioned to xarelto, however, she developed periorbital ecchymosis. Xarelto was D/c'd and IVF filter was placed. She was discharged to home 4/14. 4/21 she again presented to [facility] c/o orthostasis and syncope. She was admitted and treated initially with IVF, which resulted in massive lower extremity edema. Echocardiogram has normalized since prior admission. Repeat CT showed improved PEs, bilateral LE DVTs. Venogram showed clot extending to IVC. DVTs lysed. Started on heparin gtt. Has had hematoma development in both arms, and has required blood transfusion.  Assessment/Plan:  Bilateral LE DVT and IVC thrombosis - On IV heparin -- Platelet aggregation study and von Willebrand panel ok. Continue heparin and coumadin. Monitor h/h. Coumadin nearly therapeutic. D/w Dr. Beryle Beams. He recommends continuing heparin for 1 more day, repeat INR, CBC tomorrow and if stable and INR ok, dishcarge home without further bridging.  He will f/u, but also needs to see PCP next week for INR check  ABLA: transfused 5/1. Check h/h tomorrow  Hematoma of b/l arms- appears about the same today.  AKI- resolved  HLD -not on statin  GERD  Hypokalemia: repleted  R breast cancer  Edema: weight decreasing  Code Status: full Family Communication: Husband at bedside Disposition Plan: possibly home tomorrow   Consultants:  Hematology  pulm  IR  Procedures:  Thrombolysis LE DVTs  PICC  venogram  HPI/Subjective: No new complaints  Objective: Filed Vitals:   10/25/14 1329  BP: 140/59  Pulse: 103  Temp: 98.5 F (36.9 C)  Resp: 20    Intake/Output Summary (Last 24 hours) at 10/25/14 1506 Last data  filed at 10/25/14 1330  Gross per 24 hour  Intake    480 ml  Output      0 ml  Net    480 ml   Filed Weights   10/24/14 0500 10/25/14 0421 10/25/14 1044  Weight: 94 kg (207 lb 3.7 oz) 94.666 kg (208 lb 11.2 oz) 92.851 kg (204 lb 11.2 oz)    Exam: Gen: in chair. More pleasant today Lungs CTA without WRR CV RRR without WRR S, NT, ND No leg edema about the same Skin: ecchymoses about the same  Data Reviewed: Basic Metabolic Panel:  Recent Labs Lab 10/19/14 0438 10/20/14 0602 10/21/14 0313 10/22/14 0343 10/24/14 0305  NA 137 139 139 141 142  K 3.6 3.7 3.4* 3.7 4.0  CL 103 106 105 105 106  CO2 27 27 26 27 29   GLUCOSE 121* 109* 116* 110* 112*  BUN 9 7 7 7 6   CREATININE 0.68 0.59 0.64 0.60 0.73  CALCIUM 7.9* 7.8* 8.0* 8.6* 8.9  MG  --  1.9  --   --   --   PHOS  --  3.9  --   --   --    Liver Function Tests: No results for input(s): AST, ALT, ALKPHOS, BILITOT, PROT, ALBUMIN in the last 168 hours. No results for input(s): LIPASE, AMYLASE in the last 168 hours. No results for input(s): AMMONIA in the last 168 hours. CBC:  Recent Labs Lab 10/19/14 0438 10/20/14 0602 10/20/14 1830 10/21/14 0313 10/22/14 0343 10/23/14 0849 10/24/14 0305  WBC 10.5 7.7 8.6 8.0 5.7 7.5 6.9  NEUTROABS 8.0* 5.6  --  5.5  --   --   --  HGB 7.1* 6.3* 8.2* 7.9* 8.6* 10.1* 8.9*  HCT 23.4* 21.0* 25.9* 25.9* 28.1* 33.5* 29.5*  MCV 82.4 83.7 83.8 83.8 83.9 85.0 86.5  PLT 102* 155 161 176 199 324 291   Cardiac Enzymes: No results for input(s): CKTOTAL, CKMB, CKMBINDEX, TROPONINI in the last 168 hours. BNP (last 3 results)  Recent Labs  09/27/14 1046 10/10/14 2052  BNP 379.7* 66.3    ProBNP (last 3 results) No results for input(s): PROBNP in the last 8760 hours.  CBG: No results for input(s): GLUCAP in the last 168 hours.  No results found for this or any previous visit (from the past 240 hour(s)).   Studies: No results found.  Scheduled Meds: . acidophilus  1 capsule Oral  Daily  . famotidine  20 mg Oral Daily  . hydrocortisone cream   Topical BID  . iron polysaccharides  150 mg Oral Daily  . Warfarin - Pharmacist Dosing Inpatient   Does not apply q1800   Continuous Infusions: . 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 If 7PM-7AM, please contact night-coverage at [email or web address], password [place] 10/25/2014, 3:06 PM  LOS: 15 days

## 2014-10-25 NOTE — Discharge Instructions (Signed)

## 2014-10-26 LAB — CBC
HCT: 30.9 % — ABNORMAL LOW (ref 36.0–46.0)
Hemoglobin: 9.1 g/dL — ABNORMAL LOW (ref 12.0–15.0)
MCH: 25.6 pg — ABNORMAL LOW (ref 26.0–34.0)
MCHC: 29.4 g/dL — ABNORMAL LOW (ref 30.0–36.0)
MCV: 86.8 fL (ref 78.0–100.0)
PLATELETS: 320 10*3/uL (ref 150–400)
RBC: 3.56 MIL/uL — AB (ref 3.87–5.11)
RDW: 22.6 % — AB (ref 11.5–15.5)
WBC: 5.2 10*3/uL (ref 4.0–10.5)

## 2014-10-26 LAB — BASIC METABOLIC PANEL
Anion gap: 8 (ref 5–15)
BUN: 8 mg/dL (ref 6–20)
CO2: 27 mmol/L (ref 22–32)
Calcium: 8.8 mg/dL — ABNORMAL LOW (ref 8.9–10.3)
Chloride: 104 mmol/L (ref 101–111)
Creatinine, Ser: 0.65 mg/dL (ref 0.44–1.00)
Glucose, Bld: 100 mg/dL — ABNORMAL HIGH (ref 70–99)
Potassium: 3.8 mmol/L (ref 3.5–5.1)
Sodium: 139 mmol/L (ref 135–145)

## 2014-10-26 LAB — HEPARIN LEVEL (UNFRACTIONATED): Heparin Unfractionated: 0.31 IU/mL (ref 0.30–0.70)

## 2014-10-26 LAB — PROTIME-INR
INR: 2.22 — AB (ref 0.00–1.49)
PROTHROMBIN TIME: 24.8 s — AB (ref 11.6–15.2)

## 2014-10-26 MED ORDER — ACETAMINOPHEN 325 MG PO TABS: 650.0000 mg | ORAL_TABLET | Freq: Four times a day (QID) | ORAL | Status: DC | PRN

## 2014-10-26 MED ORDER — WARFARIN SODIUM 5 MG PO TABS: 5.0000 mg | ORAL_TABLET | Freq: Every day | ORAL | Status: DC

## 2014-10-26 NOTE — Discharge Summary (Signed)
Physician Discharge Summary  [name] WFU:[id number] DOB: 06/02/1941 DOA: 10/10/2014  PCP: Osborne Casco, MD  Admit date: 10/10/2014 Discharge date: 10/26/2014  Time spent: greater than 30 minutes  Recommendations for Outpatient Follow-up:  Monitor INR, adjust coumadin if needed for INR 2.0 - 3.0 moniotr H/H Will need arrangements to have IVC filter removed 1-3 months per Dr. 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 She was admitted to [facility] ED 09/27/14 with bilateral PE and received thrombolysis via EKOS on 4/9. Heprain gtt was eventually transitioned to xarelto, however, she developed periorbital ecchymosis. Xarelto was D/c'd and IVF filter was placed. She was discharged to home 4/14. 4/21 she again presented to [facility] c/o orthostasis and syncope. She was admitted and treated initially with IVF, which resulted in massive lower extremity edema. Echocardiogram has normalized since prior admission. Repeat CT showed improved PEs, bilateral LE DVTs. Venogram showed clot extending to IVC. DVTs lysed. Started on heparin gtt. Has had hematoma development in both arms, and  has required blood transfusion.  Assessment/Plan: Found to have thrombosisdistal IVC filterwith l EXTENSIVE ILIO-FEMORAL thrombosis - Duplex LE with Acute DVT and CTabd with thrombosis distal to IVC started on heparin gtt. Hematology and pulmonary medicine consulted. IR consulted and Bilateral venous access obtained in the popliteal fossas. Bilateral venograms demonstrate extensive DVT involving both legs and extending into the iliac veins and IVC. The clot extends into the IVC filter. TNK/thrombolysis infusion given via catheter to both legs.   Developed ecchymoses and hematoma in arms where blood pressure cuff and picc line were placed.  hgb dropped to a low of 6.1, and patient was transfused 1 unit prbc. Platelet aggregation testing and von Willebrand panel done and were unremarkable.  Hematology recommends starting coumadin.  And removing IVC filter in a month. Dr. Beryle Beams will f/u as outpatient, but recommends PCP check inr within a week.  H/h stable, edema improved, working with PT at the time of discharge.  PICC line removed  Consultants:  Hematology  Pulmonary medicine  IR  Procedures:  Thrombolysis LE DVTs  PICC  venogram  Discharge Exam: Filed Vitals:   10/26/14 0939  BP: 130/68  Pulse: 101  Temp: 97.9 F (36.6 C)  Resp: 18    General: in chair. comfortable Cardiovascular: RRR Respiratory: CTA Ext: ecchymoses both arms. Legs with trace edema  Discharge Instructions   Discharge Instructions    Diet - low sodium heart healthy    Complete by:  As directed      Discharge instructions    Complete by:  As directed   No ibuprofen, naprosyn or  aspirin products. May take tylenol for pain     Increase activity slowly    Complete by:  As directed      Increase activity slowly    Complete by:  As directed             Medication List    STOP taking these medications        famotidine 10 MG tablet  Commonly known as:  PEPCID       TAKE these medications        acetaminophen 325 MG tablet  Commonly known as:  TYLENOL  Take 2 tablets (650 mg total) by mouth every 6 (six) hours as needed for mild pain, fever or headache.     BIOTIN PO  Take 1 tablet by mouth every morning.     CALCIUM PO  Take 1 capsule by mouth 2 (two) times daily. 1500mg  twice a day, chews.     Fish Oil 1000 MG Caps  Take 1 capsule by mouth 2 (two) times daily.     iron polysaccharides 150 MG capsule  Commonly known as:  NIFEREX  Take 150 mg by mouth daily.     lisinopril 20 MG tablet  Commonly known as:  PRINIVIL,ZESTRIL  Take 20 mg by mouth daily.     multivitamin tablet  Take 1 tablet by mouth daily. gummies     non-metallic deodorant Misc  Commonly known as:  ALRA  Apply 1 application topically daily.     PHILLIPS COLON HEALTH PO  Take 1 capsule by mouth daily.     ranitidine 150 MG tablet  Commonly known as:  ZANTAC  Take 1 tablet (150 mg total) by mouth at bedtime.     vitamin C 100 MG tablet  Take 100 mg by mouth daily.     Vitamin D3 5000 UNITS Tabs  Take 5,000 Units by mouth daily.     vitamin E 100 UNIT capsule  Take 100 Units by mouth daily.     warfarin 5 MG tablet  Commonly known as:  COUMADIN  Take 1 tablet (5 mg total) by mouth daily at 6 PM. Or as directed by primary care provider       No Known Allergies Follow-up Information    Follow up with Osborne Casco, MD. Schedule an appointment as soon as possible for a visit on 11/05/2014.   Specialty:  Family Medicine   Why:  for hospital follow-up.   Contact information:   [address] [place] [address] [phone]       Please follow up.   Why:  Spoke with VICKY(office scheduler)informed Rn would call patient with appt. date.      Follow up with Annia Belt, MD.   Specialty:  Oncology   Why:  his office will call you for follow up   Contact information:   [place] [place]  [id number] [phone]        The results of significant diagnostics from this hospitalization (including imaging, microbiology, ancillary and laboratory) are listed below for reference.    Significant Diagnostic Studies: Dg Chest 2 View  09/27/2014   CLINICAL DATA:  Shortness of breath for a few days.  EXAM: CHEST  2 VIEW  COMPARISON:  PA and lateral chest 07/26/2013.  FINDINGS: The lungs are clear. Heart size is normal. No pneumothorax or pleural effusion. Small hiatal hernia is noted. Calcified breast implants seen on the prior study are no longer visualized.  IMPRESSION: No acute disease.  Small hiatal hernia.   Electronically Signed   By: Inge Rise M.D.   On: 09/27/2014 11:10   Ct Head Wo Contrast  10/10/2014   CLINICAL DATA:  Admitted for multiple blood clots. Syncopal episode today at home.  EXAM: CT HEAD WITHOUT CONTRAST  TECHNIQUE: Contiguous axial images were obtained from the base of the skull through the vertex without intravenous contrast.  COMPARISON:  09/28/2014; 12/27/2007  FINDINGS: The gray-white differentiation is maintained. No CT evidence of acute large territory infarct. No intraparenchymal or extra-axial mass or hemorrhage. Unchanged size and configuration of the ventricles and basilar cisterns. No midline shift. There is scattered polypoid mucosal thickening of the right posterior ethmoidal air cells as well as the left sphenoid sinus. The remaining paranasal sinuses and mastoid air cells appear normally aerated. No air-fluid levels. Old right-sided nasal bone fracture (representative image 14, series 3). No acutely displaced calvarial fracture. Regional soft tissues appear normal.  IMPRESSION: Negative noncontrast head CT.   Electronically Signed   By: Sandi Mariscal M.D.   On: 10/10/2014 22:50   Ct Head Wo Contrast  09/28/2014   CLINICAL DATA:  Acute pulmonary embolism. No history of breast cancer.  EXAM: CT HEAD WITHOUT CONTRAST  TECHNIQUE: Contiguous axial images were  obtained from the base of the skull through the vertex without intravenous contrast.  COMPARISON:  Head CT 12/27/2007  FINDINGS: All No acute intracranial hemorrhage. No focal mass lesion. No CT evidence of acute infarction. No midline shift or mass effect. No hydrocephalus. Basilar cisterns are patent. Paranasal sinuses and mastoid air cells are clear. Mild periventricular white matter hypodensities.  IMPRESSION: No acute intracranial findings.  Mild white matter microvascular change.   Electronically Signed   By: Suzy Bouchard M.D.   On: 09/28/2014 11:09   Ct Angio Chest Pe W/cm &/or Wo Cm  10/14/2014   CLINICAL DATA:  Recent right breast cancer surgery with 3 positive lymph nodes. History of pulmonary embolism, DVT and IVC filter placement.  EXAM: CT ANGIOGRAPHY CHEST  CT ABDOMEN AND PELVIS WITH CONTRAST  TECHNIQUE: Multidetector CT imaging of the chest was performed using the standard protocol during bolus administration of intravenous contrast. Multiplanar CT image reconstructions and MIPs were obtained to evaluate the vascular anatomy. Multidetector CT imaging of the abdomen and pelvis was performed using the standard protocol during bolus administration of intravenous contrast.  CONTRAST:  19mL OMNIPAQUE IOHEXOL 350 MG/ML SOLN  COMPARISON:  Mid chest CTA 09/27/2014. Abdominal pelvic CT 08/09/2013.  FINDINGS: CTA CHEST FINDINGS  Mediastinum: The pulmonary arteries are well opacified with contrast. There has been interval significant lysis of the previously demonstrated acute pulmonary embolism bilaterally. There is a small amount of residual nonocclusive thromboembolic disease within the segmental and subsegmental branches of the pulmonary arteries. The RV to LV ratio has nearly normalized (1.06). Left arm PICC the tip is in the lower SVC.Mild atherosclerosis appears stable. There are no enlarged mediastinal, hilar or axillary lymph nodes. There is a moderate size hiatal hernia.  Lungs/Pleura: There is  no pleural effusion.There is mildly increased dependent atelectasis at both lung bases. No confluent airspace opacity or suspicious nodule.  Musculoskeletal/Chest wall: Bilateral breast implants/tissue expanders noted. No evidence of chest wall mass or suspicious osseous finding.  CT ABDOMEN and PELVIS FINDINGS  Hepatobiliary: Portal phase images through the abdomen are motion degraded. The liver demonstrates mild steatosis, but no focal lesion. No evidence of gallstones, gallbladder wall thickening or biliary dilatation.  Pancreas:  Unremarkable. No pancreatic ductal dilatation or surrounding inflammatory changes.  Spleen: Normal in size without focal abnormality.  Adrenals/Urinary Tract: Both adrenal glands appear normal.There is a stable small low-density lesion posteriorly in the mid left kidney, best seen on image 11 of series 601. No evidence of hydronephrosis. The bladder appears normal.  Stomach/Bowel: No evidence of bowel wall thickening, distention or surrounding inflammatory change.Mild sigmoid diverticulosis.  Vascular/Lymphatic: There are no enlarged abdominal or pelvic lymph nodes. Mild aortoiliac atherosclerosis. Interval IVC filter placement inferior to the renal veins. There is limited contrast opacification of the infrarenal IVC and pelvic veins.  Reproductive: Probable uterine fibroid as before.  No adnexal mass.  Other: Interval development of subcutaneous edema in both flanks and proximal thighs. There is also mild mesenteric edema with a small amount of pelvic ascites.  Musculoskeletal: No acute or significant osseous findings.  Review of the MIP images confirms the above findings.  IMPRESSION: 1. Interval successful near-complete lysis of previously demonstrated bilateral pulmonary emboli. Signs of right heart strain have nearly completely resolved. 2. IVC filter placement with normal opacification of the suprarenal IVC. The infrarenal IVC and pelvic veins are suboptimally opacified. 3.  Anasarca with generalized subcutaneous edema in the flanks and thighs and minimal ascites. 4. No evidence of metastatic breast cancer.   Electronically Signed   By: Richardean Sale M.D.   On: 10/14/2014 14:10   Ct Angio Chest Pe W/cm &/or Wo Cm  09/27/2014   CLINICAL DATA:  73YOF today pt was on the toilet and passed out. Small hematoma to head. [facility] reported that pt was orthostatic and there was a loss of radial pulse while standing. History of right breast cancer.  EXAM: CT ANGIOGRAPHY CHEST WITH CONTRAST  TECHNIQUE: Multidetector CT imaging of the chest was performed using the standard protocol during bolus administration of intravenous contrast. Multiplanar CT image reconstructions and MIPs were obtained to evaluate the vascular anatomy.  CONTRAST:  7mL OMNIPAQUE IOHEXOL 350 MG/ML SOLN  COMPARISON:  CT abdomen 08/09/2013 and chest x-ray today.  FINDINGS: Lungs are clear.  Airways are normal.  Heart is normal in size. Pulmonary arterial system is well opacified and demonstrates moderate burden of emboli over the origin of the upper and lower lobar pulmonary arteries bilaterally as well as the proximal lingular and right middle lobe pulmonary arteries. There is evidence of right heart strain with elevated RV/LV ratio of 50.5 mm/27.2 mm = 1.9 (normal less than 0.9).  There is a moderate size hiatal hernia. Remaining mediastinal structures are unremarkable. Bilateral breast implants present.  Images through the upper abdomen demonstrate a 3 mm calcification over the upper pole left kidney. Mild degenerative change of the spine. No acute fractures.  Review of the MIP images confirms the above findings.  IMPRESSION: Moderate burden of bilateral pulmonary emboli. Positive for acute PE with CT evidence of right heart strain (RV/LV Ratio = 1.9) consistent with at least submassive (intermediate risk) PE. The presence of right heart strain has been associated with an increased risk of morbidity and mortality. Please  activate Code PE by paging [phone].  Moderate size hiatal hernia.  3 mm calcification over the upper pole left renal cortex.  Critical Value/emergent results were called by telephone at the time of interpretation on 09/27/2014 at 1:33 pm to Dr. Blanchie Dessert , who verbally acknowledged these results.   Electronically Signed   By: Marin Olp M.D.   On: 09/27/2014 13:34   Mr Brain Wo Contrast  10/12/2014  CLINICAL DATA:  Episode of feeling clammy, diaphoretic, cold, dizzy, and lightheaded beginning 4 hours ago. History of right lower extremity DVT and pulmonary embolism recently. IVC filter placement. Syncopal episode. Ortho static dizziness.  EXAM: MRI HEAD WITHOUT CONTRAST  TECHNIQUE: Multiplanar, multiecho pulse sequences of the brain and surrounding structures were obtained without intravenous contrast.  COMPARISON:  CT head without contrast 10/10/2014  FINDINGS: No acute infarct, hemorrhage, or mass lesion is present. Mild atrophy is present. Moderate periventricular and scattered subcortical T2 changes are noted bilaterally. The ventricles are proportionate to the degree of atrophy. Mild white matter changes extend into the brainstem.  The globes and orbits are intact. Mild mucosal thickening is present within the ethmoid air cells bilaterally. There are no fluid levels. The remaining paranasal sinuses are clear. There is fluid in the left mastoid air cells. No obstructing nasopharyngeal lesion is present.  IMPRESSION: 1. No acute intracranial abnormality. 2. Moderate periventricular and scattered subcortical T2 changes bilaterally. These are nonspecific, but likely reflects sequela of chronic microvascular ischemia. 3. Left mastoid effusion. No obstructing nasopharyngeal lesion is present.   Electronically Signed   By: San Morelle M.D.   On: 10/12/2014 19:14   Ct Abdomen Pelvis W Contrast  10/15/2014   CLINICAL DATA:  Evaluate IVC filter  EXAM: CT ABDOMEN AND PELVIS WITH CONTRAST   TECHNIQUE: Multidetector CT imaging of the abdomen and pelvis was performed using the standard protocol following bolus administration of intravenous contrast.  CONTRAST:  153mL OMNIPAQUE IOHEXOL 350 MG/ML SOLN  COMPARISON:  10/14/2014  FINDINGS: An IVC filter is stable in position. There is low-density within the filter as well as extending above the filter 12 mm. Bilateral iliac veins are somewhat distended and with heterogeneous density. These findings are worrisome for thrombus within the filter and extending just above the filter. Iliac veins are probably Ing cord sheet and dilated secondary to venous hypertension. Thrombus in the iliac venous system is not excluded.  Mild bibasilar atelectasis.  Hiatal hernia.  Diffuse hepatic steatosis.  Spleen, adrenal gland are within normal limits. Stable appearance of the kidneys.  There is a 21 x 11 mm low-density lesion at the junction of the body and head of the pancreas. This was poorly visualized yesterday secondary to motion artifact.  Stranding and edema within the subcutaneous fat throughout the abdomen and pelvis is stable.  Uterus remains heterogeneous  Bladder is decompressed.  Sigmoid diverticulosis without diverticulitis.  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  IMPRESSION: Findings are worrisome for thrombus within the IVC filter and extending just above the filter. Thrombus in the iliac venous system cannot be excluded. The patient does have bilateral lower extremity DVT diagnosed by Doppler ultrasound. Dedicated venogram can be performed to further delineate.  There is a lesion within the junction of the body and head of the pancreas as described. It is low-density. Malignancy is not excluded. MRI of the pancreas is recommended to further delineate.  Other findings are stable compared with yesterday.   Electronically Signed   By: Marybelle Killings M.D.   On: 10/15/2014 11:07   Ct Abdomen Pelvis W Contrast  10/14/2014   CLINICAL DATA:   Recent right breast cancer surgery with 3 positive lymph nodes. History of pulmonary embolism, DVT and IVC filter placement.  EXAM: CT ANGIOGRAPHY CHEST  CT ABDOMEN AND PELVIS WITH CONTRAST  TECHNIQUE: Multidetector CT imaging of the chest was performed using the standard protocol during bolus administration of intravenous contrast. Multiplanar CT image  reconstructions and MIPs were obtained to evaluate the vascular anatomy. Multidetector CT imaging of the abdomen and pelvis was performed using the standard protocol during bolus administration of intravenous contrast.  CONTRAST:  65mL OMNIPAQUE IOHEXOL 350 MG/ML SOLN  COMPARISON:  Mid chest CTA 09/27/2014. Abdominal pelvic CT 08/09/2013.  FINDINGS: CTA CHEST FINDINGS  Mediastinum: The pulmonary arteries are well opacified with contrast. There has been interval significant lysis of the previously demonstrated acute pulmonary embolism bilaterally. There is a small amount of residual nonocclusive thromboembolic disease within the segmental and subsegmental branches of the pulmonary arteries. The RV to LV ratio has nearly normalized (1.06). Left arm PICC the tip is in the lower SVC.Mild atherosclerosis appears stable. There are no enlarged mediastinal, hilar or axillary lymph nodes. There is a moderate size hiatal hernia.  Lungs/Pleura: There is no pleural effusion.There is mildly increased dependent atelectasis at both lung bases. No confluent airspace opacity or suspicious nodule.  Musculoskeletal/Chest wall: Bilateral breast implants/tissue expanders noted. No evidence of chest wall mass or suspicious osseous finding.  CT ABDOMEN and PELVIS FINDINGS  Hepatobiliary: Portal phase images through the abdomen are motion degraded. The liver demonstrates mild steatosis, but no focal lesion. No evidence of gallstones, gallbladder wall thickening or biliary dilatation.  Pancreas: Unremarkable. No pancreatic ductal dilatation or surrounding inflammatory changes.  Spleen:  Normal in size without focal abnormality.  Adrenals/Urinary Tract: Both adrenal glands appear normal.There is a stable small low-density lesion posteriorly in the mid left kidney, best seen on image 11 of series 601. No evidence of hydronephrosis. The bladder appears normal.  Stomach/Bowel: No evidence of bowel wall thickening, distention or surrounding inflammatory change.Mild sigmoid diverticulosis.  Vascular/Lymphatic: There are no enlarged abdominal or pelvic lymph nodes. Mild aortoiliac atherosclerosis. Interval IVC filter placement inferior to the renal veins. There is limited contrast opacification of the infrarenal IVC and pelvic veins.  Reproductive: Probable uterine fibroid as before.  No adnexal mass.  Other: Interval development of subcutaneous edema in both flanks and proximal thighs. There is also mild mesenteric edema with a small amount of pelvic ascites.  Musculoskeletal: No acute or significant osseous findings.  Review of the MIP images confirms the above findings.  IMPRESSION: 1. Interval successful near-complete lysis of previously demonstrated bilateral pulmonary emboli. Signs of right heart strain have nearly completely resolved. 2. IVC filter placement with normal opacification of the suprarenal IVC. The infrarenal IVC and pelvic veins are suboptimally opacified. 3. Anasarca with generalized subcutaneous edema in the flanks and thighs and minimal ascites. 4. No evidence of metastatic breast cancer.   Electronically Signed   By: Richardean Sale M.D.   On: 10/14/2014 14:10   Ir Angiogram Pulmonary Bilateral Selective  09/29/2014   CLINICAL DATA:  Pulmonary thromboembolism.  Right heart strain.  EXAM: BILATERAL PULMONARY ARTERIOGRAPHY; ADDITIONAL ARTERIOGRAPHY; IR ULTRASOUND GUIDANCE VASC ACCESS RIGHT; IR INFUSION THROMBOL ARTERIAL INITIAL (MS)  FLUOROSCOPY TIME:  6 minutes 24 seconds.  MEDICATIONS AND MEDICAL HISTORY: Versed 1 mg, Fentanyl 50 mcg.  Additional Medications: None.   ANESTHESIA/SEDATION: Moderate sedation time: 30 minutes  CONTRAST:  50 cc Omnipaque 300  PROCEDURE: The procedure, risks, benefits, and alternatives were explained to the patient. Questions regarding the procedure were encouraged and answered. The patient understands and consents to the procedure.  The right groin was prepped with Betadine in a sterile fashion, and a sterile drape was applied covering the operative field. A sterile gown and sterile gloves were used for the procedure.  Under sonographic guidance, a micropuncture  needle was placed into the right common femoral vein and removed over a 018 wire which was up sized to a Bentson. A 7 French sheath was inserted. The identical procedure was performed in the same vein within additional 7 French sheath. A JB 1 catheter was advanced over a Bentson wire into the left pulmonary artery. Pressure and angiography was performed. Left pulmonary artery pressure was measured at 56/23 with a mean of 36 mm Hg. The catheter was removed over a longer Rosen wire. The identical procedure was performed into the right pulmonary artery through the other sheath. Right pulmonary artery pressure was measured at 50/23 with a mean of 31 mm Hg. Right and left 18 and 12 cm EKOS infusion catheters were then advanced into the right and left pulmonary arteries. TPA infusion was instituted.  FINDINGS: Imaging confirms right and left pulmonary artery catheter position in the descending branches. Contrast injected confirms positioning and pulmonary thromboembolism.  COMPLICATIONS: None  IMPRESSION: Successful he close bilateral pulmonary artery thrombolytic infusion.   Electronically Signed   By: Marybelle Killings M.D.   On: 09/29/2014 08:46   Ir Angiogram Selective Each Additional Vessel  09/29/2014   CLINICAL DATA:  Pulmonary thromboembolism.  Right heart strain.  EXAM: BILATERAL PULMONARY ARTERIOGRAPHY; ADDITIONAL ARTERIOGRAPHY; IR ULTRASOUND GUIDANCE VASC ACCESS RIGHT; IR INFUSION THROMBOL  ARTERIAL INITIAL (MS)  FLUOROSCOPY TIME:  6 minutes 24 seconds.  MEDICATIONS AND MEDICAL HISTORY: Versed 1 mg, Fentanyl 50 mcg.  Additional Medications: None.  ANESTHESIA/SEDATION: Moderate sedation time: 30 minutes  CONTRAST:  50 cc Omnipaque 300  PROCEDURE: The procedure, risks, benefits, and alternatives were explained to the patient. Questions regarding the procedure were encouraged and answered. The patient understands and consents to the procedure.  The right groin was prepped with Betadine in a sterile fashion, and a sterile drape was applied covering the operative field. A sterile gown and sterile gloves were used for the procedure.  Under sonographic guidance, a micropuncture needle was placed into the right common femoral vein and removed over a 018 wire which was up sized to a Bentson. A 7 French sheath was inserted. The identical procedure was performed in the same vein within additional 7 French sheath. A JB 1 catheter was advanced over a Bentson wire into the left pulmonary artery. Pressure and angiography was performed. Left pulmonary artery pressure was measured at 56/23 with a mean of 36 mm Hg. The catheter was removed over a longer Rosen wire. The identical procedure was performed into the right pulmonary artery through the other sheath. Right pulmonary artery pressure was measured at 50/23 with a mean of 31 mm Hg. Right and left 18 and 12 cm EKOS infusion catheters were then advanced into the right and left pulmonary arteries. TPA infusion was instituted.  FINDINGS: Imaging confirms right and left pulmonary artery catheter position in the descending branches. Contrast injected confirms positioning and pulmonary thromboembolism.  COMPLICATIONS: None  IMPRESSION: Successful he close bilateral pulmonary artery thrombolytic infusion.   Electronically Signed   By: Marybelle Killings M.D.   On: 09/29/2014 08:46   Ir Angiogram Selective Each Additional Vessel  09/29/2014   CLINICAL DATA:  Pulmonary  thromboembolism.  Right heart strain.  EXAM: BILATERAL PULMONARY ARTERIOGRAPHY; ADDITIONAL ARTERIOGRAPHY; IR ULTRASOUND GUIDANCE VASC ACCESS RIGHT; IR INFUSION THROMBOL ARTERIAL INITIAL (MS)  FLUOROSCOPY TIME:  6 minutes 24 seconds.  MEDICATIONS AND MEDICAL HISTORY: Versed 1 mg, Fentanyl 50 mcg.  Additional Medications: None.  ANESTHESIA/SEDATION: Moderate sedation time: 30 minutes  CONTRAST:  50 cc Omnipaque 300  PROCEDURE: The procedure, risks, benefits, and alternatives were explained to the patient. Questions regarding the procedure were encouraged and answered. The patient understands and consents to the procedure.  The right groin was prepped with Betadine in a sterile fashion, and a sterile drape was applied covering the operative field. A sterile gown and sterile gloves were used for the procedure.  Under sonographic guidance, a micropuncture needle was placed into the right common femoral vein and removed over a 018 wire which was up sized to a Bentson. A 7 French sheath was inserted. The identical procedure was performed in the same vein within additional 7 French sheath. A JB 1 catheter was advanced over a Bentson wire into the left pulmonary artery. Pressure and angiography was performed. Left pulmonary artery pressure was measured at 56/23 with a mean of 36 mm Hg. The catheter was removed over a longer Rosen wire. The identical procedure was performed into the right pulmonary artery through the other sheath. Right pulmonary artery pressure was measured at 50/23 with a mean of 31 mm Hg. Right and left 18 and 12 cm EKOS infusion catheters were then advanced into the right and left pulmonary arteries. TPA infusion was instituted.  FINDINGS: Imaging confirms right and left pulmonary artery catheter position in the descending branches. Contrast injected confirms positioning and pulmonary thromboembolism.  COMPLICATIONS: None  IMPRESSION: Successful he close bilateral pulmonary artery thrombolytic infusion.    Electronically Signed   By: Marybelle Killings M.D.   On: 09/29/2014 08:46   Ir Veno/ext/bi  10/17/2014   CLINICAL DATA:  74 year old with bilateral lower extremity DVT and iliocaval thrombus. The patient has an IVC filter in place. Patient has bilateral lower extremity edema.  EXAM: BILATERAL LOWER EXTREMITY VENOGRAPHY ; IVC VENOGRAM; PLACEMENT OF BILATERAL INFUSION CATHETERS AND INITIATION OF CATHETER DIRECTED THROMBOLYTIC THERAPY; ULTRASOUND GUIDANCE FOR VENOUS ACCESS IN THE RIGHT LEG; ULTRASOUND GUIDANCE FOR VENOUS ACCESS IN THE LEFT LEG  Physician: Stephan Minister. Henn, MD  FLUOROSCOPY TIME:  8 minutes and 42 seconds, 273.9 mGy  MEDICATIONS: 2 mg Versed, 75 mcg fentanyl. A radiology nurse monitored the patient for moderate sedation.  ANESTHESIA/SEDATION: Moderate sedation time: 45 minutes  PROCEDURE: The procedure was explained to the patient. The risks and benefits of the procedure were discussed and the patient's questions were addressed. Informed consent was obtained from the patient.  Patient was placed prone on the interventional table. Both popliteal regions were evaluated with ultrasound. The popliteal regions were prepped and draped in sterile fashion. Maximal barrier sterile technique was utilized including caps, mask, sterile gowns, sterile gloves, sterile drape, hand hygiene and skin antiseptic. Skin was anesthetized with 1% lidocaine. Using ultrasound guidance, a 21 gauge needle was directed into an enlarged and occluded proximal tibial vein near the popliteal vein. Wire was advanced into the deep venous system. Microfracture catheter was placed. A 6 French vascular sheath was placed. Using ultrasound guidance, a 21 gauge needle was directed into an enlarged superficial and thrombosed vein in the left calf. This may represent the left short saphenous vein. Wire was advanced into the popliteal vein and a micropuncture dilator set was placed. A 6 French vascular sheath was placed.  A 5 French, 100 cm catheter  was used to perform left lower extremity venogram and IVC venogram in different segments. Catheter was advanced up the left leg and left pelvis with a Glidewire. The Glidewire would not easily advanced through the filter and filter thrombus. Stiff Amplatz wire was  easily advanced through filter and a catheter was advanced above the filter. IVC venogram confirmed patency of the IVC above the filter. A 90 cm infusion catheter with 50 cm of infusion length was easily advanced over the Amplatz wire. The catheter tip was placed proximal to the IVC filter hook. In a similar fashion, the 5 [place] catheter was advanced up the right leg. Venography was performed in different segments. Catheter easily advanced up the right leg with a Glidewire and advanced into the IVC with an Amplatz wire. A second 90 cm infusion catheter with 50 cm of infusion length was advanced over the Amplatz wire and the tip was positioned proximal to the IVC filter. The wires were placed in both infusion catheters. Catheters were secured to the back of the legs.  Catheter directed thrombolytic therapy was started through each infusion catheter. Tenecteplase at 0.25 mg/hour will be infuse through each catheter for total of 0.5 mg/hour. Patient was transferred to an intensive care unit bed.  FINDINGS: Ultrasound demonstrated thrombus in the common femoral veins bilaterally. There is also thrombus in the popliteal veins bilaterally.  Right lower extremity: Thrombus in an enlarged tibial vein was accessed with ultrasound. Thrombus throughout the right femoral vein and right iliac veins. Thrombus within the infrarenal IVC including the filter. The IVC above the filter is patent.  Left lower extremity: Enlarged superficial vein in the left popliteal region was accessed. This enlarged vessel could represent the short saphenous vein. Thrombus identified in the left femoral vein, left iliac veins and distal IVC.  The infusion catheters were positioned just  above the IVC filter. The distal infusion markers are in the mid/distal femoral veins bilaterally.  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 Thrombus extends into the IVC filter. The IVC above the filter is patent.  Placement of bilateral lower extremity infusion catheters from a popliteal approach. Infusion catheters extend into the IVC. Initiation of catheter directed thrombolytic therapy through both infusion catheters.  PLAN: Plan for catheter directed thrombolysis for approximately 24 hours. Patient will return to Interventional Radiology on 10/18/2014 for follow-up venography.   Electronically Signed   By: Markus Daft M.D.   On: 10/17/2014 18:15   Ir Mariana Arn Ivc  10/17/2014   CLINICAL DATA:  74 year old with bilateral lower extremity DVT and iliocaval thrombus. The patient has an IVC filter in place. Patient has bilateral lower extremity edema.  EXAM: BILATERAL LOWER EXTREMITY VENOGRAPHY ; IVC VENOGRAM; PLACEMENT OF BILATERAL INFUSION CATHETERS AND INITIATION OF CATHETER DIRECTED THROMBOLYTIC THERAPY; ULTRASOUND GUIDANCE FOR VENOUS ACCESS IN THE RIGHT LEG; ULTRASOUND GUIDANCE FOR VENOUS ACCESS IN THE LEFT LEG  Physician: Stephan Minister. Henn, MD  FLUOROSCOPY TIME:  8 minutes and 42 seconds, 273.9 mGy  MEDICATIONS: 2 mg Versed, 75 mcg fentanyl. A radiology nurse monitored the patient for moderate sedation.  ANESTHESIA/SEDATION: Moderate sedation time: 45 minutes  PROCEDURE: The procedure was explained to the patient. The risks and benefits of the procedure were discussed and the patient's questions were addressed. Informed consent was obtained from the patient.  Patient was placed prone on the interventional table. Both popliteal regions were evaluated with ultrasound. The popliteal regions were prepped and draped in sterile fashion. Maximal barrier sterile technique was utilized including caps, mask, sterile  gowns, sterile gloves, sterile drape, hand hygiene and skin antiseptic. Skin was anesthetized with 1% lidocaine. Using ultrasound guidance, a 21 gauge needle was directed into an enlarged and  occluded proximal tibial vein near the popliteal vein. Wire was advanced into the deep venous system. Microfracture catheter was placed. A 6 French vascular sheath was placed. Using ultrasound guidance, a 21 gauge needle was directed into an enlarged superficial and thrombosed vein in the left calf. This may represent the left short saphenous vein. Wire was advanced into the popliteal vein and a micropuncture dilator set was placed. A 6 French vascular sheath was placed.  A 5 French, 100 cm catheter was used to perform left lower extremity venogram and IVC venogram in different segments. Catheter was advanced up the left leg and left pelvis with a Glidewire. The Glidewire would not easily advanced through the filter and filter thrombus. Stiff Amplatz wire was easily advanced through filter and a catheter was advanced above the filter. IVC venogram confirmed patency of the IVC above the filter. A 90 cm infusion catheter with 50 cm of infusion length was easily advanced over the Amplatz wire. The catheter tip was placed proximal to the IVC filter hook. In a similar fashion, the 5 [place] catheter was advanced up the right leg. Venography was performed in different segments. Catheter easily advanced up the right leg with a Glidewire and advanced into the IVC with an Amplatz wire. A second 90 cm infusion catheter with 50 cm of infusion length was advanced over the Amplatz wire and the tip was positioned proximal to the IVC filter. The wires were placed in both infusion catheters. Catheters were secured to the back of the legs.  Catheter directed thrombolytic therapy was started through each infusion catheter. Tenecteplase at 0.25 mg/hour will be infuse through each catheter for total of 0.5 mg/hour. Patient was transferred to an  intensive care unit bed.  FINDINGS: Ultrasound demonstrated thrombus in the common femoral veins bilaterally. There is also thrombus in the popliteal veins bilaterally.  Right lower extremity: Thrombus in an enlarged tibial vein was accessed with ultrasound. Thrombus throughout the right femoral vein and right iliac veins. Thrombus within the infrarenal IVC including the filter. The IVC above the filter is patent.  Left lower extremity: Enlarged superficial vein in the left popliteal region was accessed. This enlarged vessel could represent the short saphenous vein. Thrombus identified in the left femoral vein, left iliac veins and distal IVC.  The infusion catheters were positioned just above the IVC filter. The distal infusion markers are in the mid/distal femoral veins bilaterally.  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 Thrombus extends into the IVC filter. The IVC above the filter is patent.  Placement of bilateral lower extremity infusion catheters from a popliteal approach. Infusion catheters extend into the IVC. Initiation of catheter directed thrombolytic therapy through both infusion catheters.  PLAN: Plan for catheter directed thrombolysis for approximately 24 hours. Patient will return to Interventional Radiology on 10/18/2014 for follow-up venography.   Electronically Signed   By: Markus Daft M.D.   On: 10/17/2014 18:15   Ir Ivc Filter Plmt / S&i /img Guid/mod Sed  10/02/2014   CLINICAL DATA:  Pulmonary thromboembolism. Peri-orbitall hemorrhage after anti coagulation.  EXAM: IVC FILTER,INFERIOR VENA CAVOGRAM  FLUOROSCOPY TIME:  1 minutes and 12 seconds.  MEDICATIONS AND MEDICAL HISTORY: Versed 1 mg, Fentanyl 50 mcg.  Additional Medications: None.  ANESTHESIA/SEDATION: Moderate sedation time: 15 minutes  CONTRAST:  40 cc Omnipaque 300  PROCEDURE: The procedure, risks, benefits, and alternatives  were explained to the patient. Questions regarding the  procedure were encouraged and answered. The patient understands and consents to the procedure.  The right neck was prepped with Betadine in a sterile fashion, and a sterile drape was applied covering the operative field. A sterile gown and sterile gloves were used for the procedure.  The Betadine vein was noted to be patent initially with ultrasound. Under sonographic guidance, a micropuncture needle was inserted into the right internal jugular vein (Ultrasound image documentation was performed). It was removed over an 018 wire which was upsized to a [place]. The sheath was inserted over the wire and into the IVC. IVC venography was performed.  The temporary filter was then deployed in the infrarenal IVC. The sheath was removed and hemostasis was achieved with direct pressure.  FINDINGS: IVC venography confirms renal vein inflow at L1. No IVC thrombus or venous anomaly.  The image demonstrates placement of an IVC filter with its tip at the L1-2 disc.  COMPLICATIONS: None  IMPRESSION: Successful infrarenal IVC filter placement. This is a temporary filter. It can be removed or remain in place to become permanent.   Electronically Signed   By: Marybelle Killings M.D.   On: 10/02/2014 13:21   US Renal  10/11/2014   CLINICAL DATA:  Acute renal insufficiency  EXAM: RENAL/URINARY TRACT ULTRASOUND COMPLETE  COMPARISON:  CT scan 2/ 19/ 15  FINDINGS: Right Kidney:  Length: 9 mm. Echogenicity within normal limits. No mass or hydronephrosis visualized. Mild renal cortical thinning probable due to atrophy.  Left Kidney:  Length: 10.7 cm. Echogenicity within normal limits. No mass or hydronephrosis visualized. Mild renal cortical thinning probable due to atrophy.  Bladder:  Appears normal for degree of bladder distention.  IMPRESSION: 1. No hydronephrosis. No renal calculi. Bilateral mild renal cortical thinning probable due to atrophy. Unremarkable urinary bladder.    Electronically Signed   By: Lahoma Crocker M.D.   On: 10/11/2014 09:46   Ir Pta Venous Left  10/18/2014   CLINICAL DATA:  24 hours venous lytic follow-up. There is significant bleeding from the left upper extremity PICC site. Fibrinogen levels have been dropping. The lytic and heparin has been stopped.  EXAM: IR THROMB F/U EVAL ART/VEN SUBSEQ DAY (MS); PTA VENOUS  FLUOROSCOPY TIME:  7 minutes and 24 seconds  MEDICATIONS AND MEDICAL HISTORY: Versed 2 mg, Fentanyl 100 mcg.  Additional Medications: None.  ANESTHESIA/SEDATION: Moderate sedation time: 30 minutes  CONTRAST:  100 cc Omnipaque 300  PROCEDURE: The procedure, risks, benefits, and alternatives were explained to the patient. Questions regarding the procedure were encouraged and answered. The patient understands and consents to the procedure.  The bilateral popliteal fossas were prepped with Betadine in a sterile fashion, and a sterile drape was applied covering the operative field. A sterile gown and sterile gloves were used for the procedure.  Six [place] sheaths were exchanged for 7 French sheaths in the popliteal fossas bilaterally after 1% lidocaine infiltration.  Bilateral lower extremity and iliac venography was performed. This demonstrates significant improvement with antegrade flow but significant residual thrombus in the femoral and iliac venous system. The IVC filter and IVC were noted to be widely patent.  Bilateral lower extremities as well as the iliac venous systems were then lysed but the Angiojet for nearly 150 seconds. Repeat venography was performed.  The left common iliac vein was dilated to 12 mm. Repeat venography was performed.  The Angiojet was then administered within the left common iliac vein foreign additional 50 seconds. Final venogram was performed.  Sheaths were removed  and hemostasis was achieved with Vpad and direct pressure.  FINDINGS: Initial venography demonstrates wide patency of the IVC. There is significant improvement in the  iliac venous system and femoral venous system with some residual thrombus.  After utilizing the Angiojet, the femoral venous system was noted to be patent bilaterally. The right iliac venous system was patent. There was significant residual thrombus in the left iliac venous system with luminal narrowing  After dilatation to 12 mm, the left common iliac vein was improved  After the final Angiojet administration in the left common iliac vein, final venography demonstrates further improvement but some residual thrombus. Brisk antegrade flow was established bilaterally. The IVC filter remained widely patent.  COMPLICATIONS: None  IMPRESSION: Overall, there has been significant improvement after 24 hours of lysis, Angiojet, and angioplasty. There is some residual thrombus in the left common iliac vein. The patient will be anticoagulated with heparin.   Electronically Signed   By: Marybelle Killings M.D.   On: 10/18/2014 17:16   Ir Thrombect Veno Mech Mod Sed  10/21/2014   CLINICAL DATA:  DVT  EXAM: THROMBOECTOMY MECHANICAL VENOUS  COMPARISON:  None.  FINDINGS: Please refer to accession [id number]  IMPRESSION: See above.   Electronically Signed   By: Marybelle Killings M.D.   On: 10/21/2014 14:42   Ir US Guide Vasc Access Left  10/17/2014   CLINICAL DATA:  74 year old with bilateral lower extremity DVT and iliocaval thrombus. The patient has an IVC filter in place. Patient has bilateral lower extremity edema.  EXAM: BILATERAL LOWER EXTREMITY VENOGRAPHY ; IVC VENOGRAM; PLACEMENT OF BILATERAL INFUSION CATHETERS AND INITIATION OF CATHETER DIRECTED THROMBOLYTIC THERAPY; ULTRASOUND GUIDANCE FOR VENOUS ACCESS IN THE RIGHT LEG; ULTRASOUND GUIDANCE FOR VENOUS ACCESS IN THE LEFT LEG  Physician: Stephan Minister. Henn, MD  FLUOROSCOPY TIME:  8 minutes and 42 seconds, 273.9 mGy  MEDICATIONS: 2 mg Versed, 75 mcg fentanyl. A radiology nurse monitored the patient for moderate sedation.  ANESTHESIA/SEDATION: Moderate sedation time: 45 minutes   PROCEDURE: The procedure was explained to the patient. The risks and benefits of the procedure were discussed and the patient's questions were addressed. Informed consent was obtained from the patient.  Patient was placed prone on the interventional table. Both popliteal regions were evaluated with ultrasound. The popliteal regions were prepped and draped in sterile fashion. Maximal barrier sterile technique was utilized including caps, mask, sterile gowns, sterile gloves, sterile drape, hand hygiene and skin antiseptic. Skin was anesthetized with 1% lidocaine. Using ultrasound guidance, a 21 gauge needle was directed into an enlarged and occluded proximal tibial vein near the popliteal vein. Wire was advanced into the deep venous system. Microfracture catheter was placed. A 6 French vascular sheath was placed. Using ultrasound guidance, a 21 gauge needle was directed into an enlarged superficial and thrombosed vein in the left calf. This may represent the left short saphenous vein. Wire was advanced into the popliteal vein and a micropuncture dilator set was placed. A 6 French vascular sheath was placed.  A 5 French, 100 cm catheter was used to perform left lower extremity venogram and IVC venogram in different segments. Catheter was advanced up the left leg and left pelvis with a Glidewire. The Glidewire would not easily advanced through the filter and filter thrombus. Stiff Amplatz wire was easily advanced through filter and a catheter was advanced above the filter. IVC venogram confirmed patency of the IVC above the filter. A 90 cm infusion catheter with 50 cm of infusion length was easily  advanced over the Amplatz wire. The catheter tip was placed proximal to the IVC filter hook. In a similar fashion, the 5 [place] catheter was advanced up the right leg. Venography was performed in different segments. Catheter easily advanced up the right leg with a Glidewire and advanced into the IVC with an Amplatz wire. A  second 90 cm infusion catheter with 50 cm of infusion length was advanced over the Amplatz wire and the tip was positioned proximal to the IVC filter. The wires were placed in both infusion catheters. Catheters were secured to the back of the legs.  Catheter directed thrombolytic therapy was started through each infusion catheter. Tenecteplase at 0.25 mg/hour will be infuse through each catheter for total of 0.5 mg/hour. Patient was transferred to an intensive care unit bed.  FINDINGS: Ultrasound demonstrated thrombus in the common femoral veins bilaterally. There is also thrombus in the popliteal veins bilaterally.  Right lower extremity: Thrombus in an enlarged tibial vein was accessed with ultrasound. Thrombus throughout the right femoral vein and right iliac veins. Thrombus within the infrarenal IVC including the filter. The IVC above the filter is patent.  Left lower extremity: Enlarged superficial vein in the left popliteal region was accessed. This enlarged vessel could represent the short saphenous vein. Thrombus identified in the left femoral vein, left iliac veins and distal IVC.  The infusion catheters were positioned just above the IVC filter. The distal infusion markers are in the mid/distal femoral veins bilaterally.  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 Thrombus extends into the IVC filter. The IVC above the filter is patent.  Placement of bilateral lower extremity infusion catheters from a popliteal approach. Infusion catheters extend into the IVC. Initiation of catheter directed thrombolytic therapy through both infusion catheters.  PLAN: Plan for catheter directed thrombolysis for approximately 24 hours. Patient will return to Interventional Radiology on 10/18/2014 for follow-up venography.   Electronically Signed   By: Markus Daft M.D.   On: 10/17/2014 18:15   Ir US Guide Vasc Access  Right  10/17/2014   CLINICAL DATA:  75 year old with bilateral lower extremity DVT and iliocaval thrombus. The patient has an IVC filter in place. Patient has bilateral lower extremity edema.  EXAM: BILATERAL LOWER EXTREMITY VENOGRAPHY ; IVC VENOGRAM; PLACEMENT OF BILATERAL INFUSION CATHETERS AND INITIATION OF CATHETER DIRECTED THROMBOLYTIC THERAPY; ULTRASOUND GUIDANCE FOR VENOUS ACCESS IN THE RIGHT LEG; ULTRASOUND GUIDANCE FOR VENOUS ACCESS IN THE LEFT LEG  Physician: Stephan Minister. Henn, MD  FLUOROSCOPY TIME:  8 minutes and 42 seconds, 273.9 mGy  MEDICATIONS: 2 mg Versed, 75 mcg fentanyl. A radiology nurse monitored the patient for moderate sedation.  ANESTHESIA/SEDATION: Moderate sedation time: 45 minutes  PROCEDURE: The procedure was explained to the patient. The risks and benefits of the procedure were discussed and the patient's questions were addressed. Informed consent was obtained from the patient.  Patient was placed prone on the interventional table. Both popliteal regions were evaluated with ultrasound. The popliteal regions were prepped and draped in sterile fashion. Maximal barrier sterile technique was utilized including caps, mask, sterile gowns, sterile gloves, sterile drape, hand hygiene and skin antiseptic. Skin was anesthetized with 1% lidocaine. Using ultrasound guidance, a 21 gauge needle was directed into an enlarged and occluded proximal tibial vein near the popliteal vein. Wire was advanced into the deep venous system. Microfracture catheter was placed. A 6 French vascular sheath was placed. Using ultrasound guidance, a 21  gauge needle was directed into an enlarged superficial and thrombosed vein in the left calf. This may represent the left short saphenous vein. Wire was advanced into the popliteal vein and a micropuncture dilator set was placed. A 6 French vascular sheath was placed.  A 5 French, 100 cm catheter was used to perform left lower extremity venogram and IVC venogram in different  segments. Catheter was advanced up the left leg and left pelvis with a Glidewire. The Glidewire would not easily advanced through the filter and filter thrombus. Stiff Amplatz wire was easily advanced through filter and a catheter was advanced above the filter. IVC venogram confirmed patency of the IVC above the filter. A 90 cm infusion catheter with 50 cm of infusion length was easily advanced over the Amplatz wire. The catheter tip was placed proximal to the IVC filter hook. In a similar fashion, the 5 [place] catheter was advanced up the right leg. Venography was performed in different segments. Catheter easily advanced up the right leg with a Glidewire and advanced into the IVC with an Amplatz wire. A second 90 cm infusion catheter with 50 cm of infusion length was advanced over the Amplatz wire and the tip was positioned proximal to the IVC filter. The wires were placed in both infusion catheters. Catheters were secured to the back of the legs.  Catheter directed thrombolytic therapy was started through each infusion catheter. Tenecteplase at 0.25 mg/hour will be infuse through each catheter for total of 0.5 mg/hour. Patient was transferred to an intensive care unit bed.  FINDINGS: Ultrasound demonstrated thrombus in the common femoral veins bilaterally. There is also thrombus in the popliteal veins bilaterally.  Right lower extremity: Thrombus in an enlarged tibial vein was accessed with ultrasound. Thrombus throughout the right femoral vein and right iliac veins. Thrombus within the infrarenal IVC including the filter. The IVC above the filter is patent.  Left lower extremity: Enlarged superficial vein in the left popliteal region was accessed. This enlarged vessel could represent the short saphenous vein. Thrombus identified in the left femoral vein, left iliac veins and distal IVC.  The infusion catheters were positioned just above the IVC filter. The distal infusion markers are in the mid/distal femoral  veins bilaterally.  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 Thrombus extends into the IVC filter. The IVC above the filter is patent.  Placement of bilateral lower extremity infusion catheters from a popliteal approach. Infusion catheters extend into the IVC. Initiation of catheter directed thrombolytic therapy through both infusion catheters.  PLAN: Plan for catheter directed thrombolysis for approximately 24 hours. Patient will return to Interventional Radiology on 10/18/2014 for follow-up venography.   Electronically Signed   By: Markus Daft M.D.   On: 10/17/2014 18:15   Ir US Guide Vasc Access Right  09/29/2014   CLINICAL DATA:  Pulmonary thromboembolism.  Right heart strain.  EXAM: BILATERAL PULMONARY ARTERIOGRAPHY; ADDITIONAL ARTERIOGRAPHY; IR ULTRASOUND GUIDANCE VASC ACCESS RIGHT; IR INFUSION THROMBOL ARTERIAL INITIAL (MS)  FLUOROSCOPY TIME:  6 minutes 24 seconds.  MEDICATIONS AND MEDICAL HISTORY: Versed 1 mg, Fentanyl 50 mcg.  Additional Medications: None.  ANESTHESIA/SEDATION: Moderate sedation time: 30 minutes  CONTRAST:  50 cc Omnipaque 300  PROCEDURE: The procedure, risks, benefits, and alternatives were explained to the patient. Questions regarding the procedure were encouraged and answered. The patient understands and consents to the procedure.  The right groin was prepped with Betadine in a sterile  fashion, and a sterile drape was applied covering the operative field. A sterile gown and sterile gloves were used for the procedure.  Under sonographic guidance, a micropuncture needle was placed into the right common femoral vein and removed over a 018 wire which was up sized to a Bentson. A 7 French sheath was inserted. The identical procedure was performed in the same vein within additional 7 French sheath. A JB 1 catheter was advanced over a Bentson wire into the left pulmonary artery. Pressure  and angiography was performed. Left pulmonary artery pressure was measured at 56/23 with a mean of 36 mm Hg. The catheter was removed over a longer Rosen wire. The identical procedure was performed into the right pulmonary artery through the other sheath. Right pulmonary artery pressure was measured at 50/23 with a mean of 31 mm Hg. Right and left 18 and 12 cm EKOS infusion catheters were then advanced into the right and left pulmonary arteries. TPA infusion was instituted.  FINDINGS: Imaging confirms right and left pulmonary artery catheter position in the descending branches. Contrast injected confirms positioning and pulmonary thromboembolism.  COMPLICATIONS: None  IMPRESSION: Successful he close bilateral pulmonary artery thrombolytic infusion.   Electronically Signed   By: Marybelle Killings M.D.   On: 09/29/2014 08:46   Ir Infusion Thrombol Arterial Initial (ms)  09/29/2014   CLINICAL DATA:  Pulmonary thromboembolism.  Right heart strain.  EXAM: BILATERAL PULMONARY ARTERIOGRAPHY; ADDITIONAL ARTERIOGRAPHY; IR ULTRASOUND GUIDANCE VASC ACCESS RIGHT; IR INFUSION THROMBOL ARTERIAL INITIAL (MS)  FLUOROSCOPY TIME:  6 minutes 24 seconds.  MEDICATIONS AND MEDICAL HISTORY: Versed 1 mg, Fentanyl 50 mcg.  Additional Medications: None.  ANESTHESIA/SEDATION: Moderate sedation time: 30 minutes  CONTRAST:  50 cc Omnipaque 300  PROCEDURE: The procedure, risks, benefits, and alternatives were explained to the patient. Questions regarding the procedure were encouraged and answered. The patient understands and consents to the procedure.  The right groin was prepped with Betadine in a sterile fashion, and a sterile drape was applied covering the operative field. A sterile gown and sterile gloves were used for the procedure.  Under sonographic guidance, a micropuncture needle was placed into the right common femoral vein and removed over a 018 wire which was up sized to a Bentson. A 7 French sheath was inserted. The identical  procedure was performed in the same vein within additional 7 French sheath. A JB 1 catheter was advanced over a Bentson wire into the left pulmonary artery. Pressure and angiography was performed. Left pulmonary artery pressure was measured at 56/23 with a mean of 36 mm Hg. The catheter was removed over a longer Rosen wire. The identical procedure was performed into the right pulmonary artery through the other sheath. Right pulmonary artery pressure was measured at 50/23 with a mean of 31 mm Hg. Right and left 18 and 12 cm EKOS infusion catheters were then advanced into the right and left pulmonary arteries. TPA infusion was instituted.  FINDINGS: Imaging confirms right and left pulmonary artery catheter position in the descending branches. Contrast injected confirms positioning and pulmonary thromboembolism.  COMPLICATIONS: None  IMPRESSION: Successful he close bilateral pulmonary artery thrombolytic infusion.   Electronically Signed   By: Marybelle Killings M.D.   On: 09/29/2014 08:46   Ir Infusion Thrombol Venous Initial (ms)  10/17/2014   CLINICAL DATA:  74 year old with bilateral lower extremity DVT and iliocaval thrombus. The patient has an IVC filter in place. Patient has bilateral lower extremity edema.  EXAM: BILATERAL LOWER EXTREMITY VENOGRAPHY ;  IVC VENOGRAM; PLACEMENT OF BILATERAL INFUSION CATHETERS AND INITIATION OF CATHETER DIRECTED THROMBOLYTIC THERAPY; ULTRASOUND GUIDANCE FOR VENOUS ACCESS IN THE RIGHT LEG; ULTRASOUND GUIDANCE FOR VENOUS ACCESS IN THE LEFT LEG  Physician: Stephan Minister. Henn, MD  FLUOROSCOPY TIME:  8 minutes and 42 seconds, 273.9 mGy  MEDICATIONS: 2 mg Versed, 75 mcg fentanyl. A radiology nurse monitored the patient for moderate sedation.  ANESTHESIA/SEDATION: Moderate sedation time: 45 minutes  PROCEDURE: The procedure was explained to the patient. The risks and benefits of the procedure were discussed and the patient's questions were addressed. Informed consent was obtained from the patient.   Patient was placed prone on the interventional table. Both popliteal regions were evaluated with ultrasound. The popliteal regions were prepped and draped in sterile fashion. Maximal barrier sterile technique was utilized including caps, mask, sterile gowns, sterile gloves, sterile drape, hand hygiene and skin antiseptic. Skin was anesthetized with 1% lidocaine. Using ultrasound guidance, a 21 gauge needle was directed into an enlarged and occluded proximal tibial vein near the popliteal vein. Wire was advanced into the deep venous system. Microfracture catheter was placed. A 6 French vascular sheath was placed. Using ultrasound guidance, a 21 gauge needle was directed into an enlarged superficial and thrombosed vein in the left calf. This may represent the left short saphenous vein. Wire was advanced into the popliteal vein and a micropuncture dilator set was placed. A 6 French vascular sheath was placed.  A 5 French, 100 cm catheter was used to perform left lower extremity venogram and IVC venogram in different segments. Catheter was advanced up the left leg and left pelvis with a Glidewire. The Glidewire would not easily advanced through the filter and filter thrombus. Stiff Amplatz wire was easily advanced through filter and a catheter was advanced above the filter. IVC venogram confirmed patency of the IVC above the filter. A 90 cm infusion catheter with 50 cm of infusion length was easily advanced over the Amplatz wire. The catheter tip was placed proximal to the IVC filter hook. In a similar fashion, the 5 [place] catheter was advanced up the right leg. Venography was performed in different segments. Catheter easily advanced up the right leg with a Glidewire and advanced into the IVC with an Amplatz wire. A second 90 cm infusion catheter with 50 cm of infusion length was advanced over the Amplatz wire and the tip was positioned proximal to the IVC filter. The wires were placed in both infusion catheters.  Catheters were secured to the back of the legs.  Catheter directed thrombolytic therapy was started through each infusion catheter. Tenecteplase at 0.25 mg/hour will be infuse through each catheter for total of 0.5 mg/hour. Patient was transferred to an intensive care unit bed.  FINDINGS: Ultrasound demonstrated thrombus in the common femoral veins bilaterally. There is also thrombus in the popliteal veins bilaterally.  Right lower extremity: Thrombus in an enlarged tibial vein was accessed with ultrasound. Thrombus throughout the right femoral vein and right iliac veins. Thrombus within the infrarenal IVC including the filter. The IVC above the filter is patent.  Left lower extremity: Enlarged superficial vein in the left popliteal region was accessed. This enlarged vessel could represent the short saphenous vein. Thrombus identified in the left femoral vein, left iliac veins and distal IVC.  The infusion catheters were positioned just above the IVC filter. The distal infusion markers are in the mid/distal femoral veins bilaterally.  Estimated blood loss: Minimal  COMPLICATIONS: None  IMPRESSION: Extensive deep vein thrombosis in both  lower extremities with iliocaval thrombus. Thrombus extends into the IVC filter. The IVC above the filter is patent.  Placement of bilateral lower extremity infusion catheters from a popliteal approach. Infusion catheters extend into the IVC. Initiation of catheter directed thrombolytic therapy through both infusion catheters.  PLAN: Plan for catheter directed thrombolysis for approximately 24 hours. Patient will return to Interventional Radiology on 10/18/2014 for follow-up venography.   Electronically Signed   By: Markus Daft M.D.   On: 10/17/2014 18:15   Ir Jacolyn Reedy F/u Eval Art/ven Final Day (ms)  10/18/2014   CLINICAL DATA:  24 hours venous lytic follow-up. There is significant bleeding from the left upper extremity PICC site. Fibrinogen levels have been dropping. The lytic and  heparin has been stopped.  EXAM: IR THROMB F/U EVAL ART/VEN SUBSEQ DAY (MS); PTA VENOUS  FLUOROSCOPY TIME:  7 minutes and 24 seconds  MEDICATIONS AND MEDICAL HISTORY: Versed 2 mg, Fentanyl 100 mcg.  Additional Medications: None.  ANESTHESIA/SEDATION: Moderate sedation time: 30 minutes  CONTRAST:  100 cc Omnipaque 300  PROCEDURE: The procedure, risks, benefits, and alternatives were explained to the patient. Questions regarding the procedure were encouraged and answered. The patient understands and consents to the procedure.  The bilateral popliteal fossas were prepped with Betadine in a sterile fashion, and a sterile drape was applied covering the operative field. A sterile gown and sterile gloves were used for the procedure.  Six [place] sheaths were exchanged for 7 French sheaths in the popliteal fossas bilaterally after 1% lidocaine infiltration.  Bilateral lower extremity and iliac venography was performed. This demonstrates significant improvement with antegrade flow but significant residual thrombus in the femoral and iliac venous system. The IVC filter and IVC were noted to be widely patent.  Bilateral lower extremities as well as the iliac venous systems were then lysed but the Angiojet for nearly 150 seconds. Repeat venography was performed.  The left common iliac vein was dilated to 12 mm. Repeat venography was performed.  The Angiojet was then administered within the left common iliac vein foreign additional 50 seconds. Final venogram was performed.  Sheaths were removed and hemostasis was achieved with Vpad and direct pressure.  FINDINGS: Initial venography demonstrates wide patency of the IVC. There is significant improvement in the iliac venous system and femoral venous system with some residual thrombus.  After utilizing the Angiojet, the femoral venous system was noted to be patent bilaterally. The right iliac venous system was patent. There was significant residual thrombus in the left iliac venous  system with luminal narrowing  After dilatation to 12 mm, the left common iliac vein was improved  After the final Angiojet administration in the left common iliac vein, final venography demonstrates further improvement but some residual thrombus. Brisk antegrade flow was established bilaterally. The IVC filter remained widely patent.  COMPLICATIONS: None  IMPRESSION: Overall, there has been significant improvement after 24 hours of lysis, Angiojet, and angioplasty. There is some residual thrombus in the left common iliac vein. The patient will be anticoagulated with heparin.   Electronically Signed   By: Marybelle Killings M.D.   On: 10/18/2014 17:16   Ir Jacolyn Reedy F/u Eval Art/ven Final Day (ms)  09/29/2014   CLINICAL DATA:  Follow-up EKOS pulmonary artery thrombolytic therapy.  EXAM: IR THROMB F/U EVAL ART/VEN FINAL DAY  FLUOROSCOPY TIME:  20 seconds.  MEDICATIONS AND MEDICAL HISTORY: None  ANESTHESIA/SEDATION: None  CONTRAST:  None  PROCEDURE: The procedure, risks, benefits, and alternatives were explained to the patient.  Questions regarding the procedure were encouraged and answered. The patient understands and consents to the procedure.  Right and left pulmonary artery pressures were obtained. Right pulmonary artery pressure is 35/27 with a mean of 31 mm Hg. That of the left is 34/29 with a mean of 31 mm Hg. The catheters and sheaths were removed. Hemostasis was achieved with direct pressure and VPAD.  FINDINGS: Fluoroscopic imaging confirms stable position of the right and left pulmonary artery EKOS catheters.  COMPLICATIONS: None  IMPRESSION: Successful conclusion of bilateral pulmonary artery EKOS thrombo lytic therapy. Peak systolic pressures have significantly improved from 56 to 35 mm Hg in the right pulmonary artery and 50 to 34 mm Hg in the left pulmonary artery.   Electronically Signed   By: Marybelle Killings M.D.   On: 09/29/2014 09:21    Microbiology: No results found for this or any previous visit (from  the past 240 hour(s)).   Labs: Basic Metabolic Panel:  Recent Labs Lab 10/20/14 0602 10/21/14 0313 10/22/14 0343 10/24/14 0305 10/26/14 0615  NA 139 139 141 142 139  K 3.7 3.4* 3.7 4.0 3.8  CL 106 105 105 106 104  CO2 27 26 27 29 27   GLUCOSE 109* 116* 110* 112* 100*  BUN 7 7 7 6 8   CREATININE 0.59 0.64 0.60 0.73 0.65  CALCIUM 7.8* 8.0* 8.6* 8.9 8.8*  MG 1.9  --   --   --   --   PHOS 3.9  --   --   --   --    Liver Function Tests: No results for input(s): AST, ALT, ALKPHOS, BILITOT, PROT, ALBUMIN in the last 168 hours. No results for input(s): LIPASE, AMYLASE in the last 168 hours. No results for input(s): AMMONIA in the last 168 hours. CBC:  Recent Labs Lab 10/20/14 0602  10/21/14 0313 10/22/14 0343 10/23/14 0849 10/24/14 0305 10/26/14 0615  WBC 7.7  < > 8.0 5.7 7.5 6.9 5.2  NEUTROABS 5.6  --  5.5  --   --   --   --   HGB 6.3*  < > 7.9* 8.6* 10.1* 8.9* 9.1*  HCT 21.0*  < > 25.9* 28.1* 33.5* 29.5* 30.9*  MCV 83.7  < > 83.8 83.9 85.0 86.5 86.8  PLT 155  < > 176 199 324 291 320  < > = values in this interval not displayed. Cardiac Enzymes: No results for input(s): CKTOTAL, CKMB, CKMBINDEX, TROPONINI in the last 168 hours. BNP: BNP (last 3 results)  Recent Labs  09/27/14 1046 10/10/14 2052  BNP 379.7* 66.3    ProBNP (last 3 results) No results for input(s): PROBNP in the last 8760 hours.  CBG: No results for input(s): GLUCAP in the last 168 hours.     SignedDelfina Redwood  [facility] 10/26/2014, 10:19 AM

## 2014-10-26 NOTE — Progress Notes (Signed)
DC instructions given to patient and her husband. Patient interjected multiple facts appropriate for the use of coumadin. She states" I have a booklet." Husband to transport patient home in private vehicle. Patient in [facility] to be escorted to lobby by staff.

## 2014-11-04 ENCOUNTER — Other Ambulatory Visit: Payer: Self-pay | Admitting: Oncology

## 2014-11-04 DIAGNOSIS — Z86718 Personal history of other venous thrombosis and embolism: Secondary | ICD-10-CM

## 2014-11-04 DIAGNOSIS — Z95828 Presence of other vascular implants and grafts: Secondary | ICD-10-CM

## 2014-11-04 DIAGNOSIS — R58 Hemorrhage, not elsewhere classified: Secondary | ICD-10-CM

## 2014-11-04 DIAGNOSIS — Z86711 Personal history of pulmonary embolism: Secondary | ICD-10-CM

## 2014-11-07 ENCOUNTER — Ambulatory Visit (INDEPENDENT_AMBULATORY_CARE_PROVIDER_SITE_OTHER): Payer: Medicare Other | Admitting: Oncology

## 2014-11-07 ENCOUNTER — Other Ambulatory Visit: Payer: Medicare Other

## 2014-11-07 ENCOUNTER — Encounter: Payer: Self-pay | Admitting: Oncology

## 2014-11-07 VITALS — BP 136/89 | HR 119 | Temp 98.5°F | Ht 65.0 in | Wt 194.9 lb

## 2014-11-07 DIAGNOSIS — C50911 Malignant neoplasm of unspecified site of right female breast: Secondary | ICD-10-CM

## 2014-11-07 DIAGNOSIS — I82401 Acute embolism and thrombosis of unspecified deep veins of right lower extremity: Secondary | ICD-10-CM

## 2014-11-07 DIAGNOSIS — C50219 Malignant neoplasm of upper-inner quadrant of unspecified female breast: Secondary | ICD-10-CM

## 2014-11-07 DIAGNOSIS — Z79810 Long term (current) use of selective estrogen receptor modulators (SERMs): Secondary | ICD-10-CM

## 2014-11-07 DIAGNOSIS — C792 Secondary malignant neoplasm of skin: Secondary | ICD-10-CM

## 2014-11-07 DIAGNOSIS — Z95828 Presence of other vascular implants and grafts: Secondary | ICD-10-CM

## 2014-11-07 DIAGNOSIS — Z7901 Long term (current) use of anticoagulants: Secondary | ICD-10-CM

## 2014-11-07 DIAGNOSIS — Z86718 Personal history of other venous thrombosis and embolism: Secondary | ICD-10-CM

## 2014-11-07 DIAGNOSIS — I871 Compression of vein: Secondary | ICD-10-CM

## 2014-11-07 DIAGNOSIS — Z7902 Long term (current) use of antithrombotics/antiplatelets: Secondary | ICD-10-CM

## 2014-11-07 DIAGNOSIS — I2699 Other pulmonary embolism without acute cor pulmonale: Secondary | ICD-10-CM

## 2014-11-07 DIAGNOSIS — I82403 Acute embolism and thrombosis of unspecified deep veins of lower extremity, bilateral: Secondary | ICD-10-CM

## 2014-11-07 DIAGNOSIS — K869 Disease of pancreas, unspecified: Secondary | ICD-10-CM

## 2014-11-07 DIAGNOSIS — Z86711 Personal history of pulmonary embolism: Secondary | ICD-10-CM

## 2014-11-07 DIAGNOSIS — Z170 Estrogen receptor positive status [ER+]: Secondary | ICD-10-CM

## 2014-11-07 LAB — CBC WITH DIFFERENTIAL/PLATELET
BASOS ABS: 0 10*3/uL (ref 0.0–0.1)
BASOS PCT: 1 % (ref 0–1)
Eosinophils Absolute: 0.3 10*3/uL (ref 0.0–0.7)
Eosinophils Relative: 4 % (ref 0–5)
HCT: 42.2 % (ref 36.0–46.0)
HEMOGLOBIN: 12.6 g/dL (ref 12.0–15.0)
LYMPHS PCT: 28 % (ref 12–46)
Lymphs Abs: 2.1 10*3/uL (ref 0.7–4.0)
MCH: 26 pg (ref 26.0–34.0)
MCHC: 29.9 g/dL — AB (ref 30.0–36.0)
MCV: 87 fL (ref 78.0–100.0)
MONO ABS: 0.4 10*3/uL (ref 0.1–1.0)
MONOS PCT: 6 % (ref 3–12)
NEUTROS ABS: 4.8 10*3/uL (ref 1.7–7.7)
NEUTROS PCT: 63 % (ref 43–77)
Platelets: 285 10*3/uL (ref 150–400)
RBC: 4.85 MIL/uL (ref 3.87–5.11)
RDW: 21 % — ABNORMAL HIGH (ref 11.5–15.5)
WBC: 7.6 10*3/uL (ref 4.0–10.5)

## 2014-11-07 LAB — PROTIME-INR
INR: 1.79 — ABNORMAL HIGH (ref 0.00–1.49)
Prothrombin Time: 20.7 seconds — ABNORMAL HIGH (ref 11.6–15.2)

## 2014-11-07 LAB — APTT: aPTT: 31 seconds (ref 24–37)

## 2014-11-07 MED ORDER — HYDROCODONE-HOMATROPINE 5-1.5 MG PO TABS: ORAL_TABLET | ORAL | Status: DC

## 2014-11-07 MED ORDER — WARFARIN SODIUM 2 MG PO TABS: 2.0000 mg | ORAL_TABLET | Freq: Every day | ORAL | Status: DC

## 2014-11-07 NOTE — Patient Instructions (Signed)
Increase coumadin to 2 mg daily Protime/INR on Monday 5/23 to be drawn by [place] service and fax results to Dr Beryle Beams @ [phone] Return visit with Dr Beryle Beams in 3-4 months We will advise you by phone for any additional adjustments in your coumadin

## 2014-11-07 NOTE — Progress Notes (Signed)
Patient ID: [name], female   DOB: Dec 12, 1940, 74 y.o.   MRN: [id number] Hematology and Oncology Follow Up Visit  [name] [id number] 30-Nov-1940 74 y.o. 11/07/2014 5:07 PM   Principle Diagnosis: Encounter Diagnoses  Name Primary?  . 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 History of pulmonary embolus (PE)   . History of DVT of lower extremity   . S/P IVC filter   . History of PE 09/27/14   . DVT of lower extremity, bilateral   . IVC (inferior vena cava obstruction)   . Pulmonary embolism    Rather complicated 74 year old woman I recently saw in consultation at [facility] on 10/16/2014. Please see hospital consult note for full details. 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 She was put on hormonal therapy with tamoxifen. She presented on 09/27/2014 following a syncopal episode and was found have bilateral pulmonary emboli and a right lower extremity DVT. She was treated with heparin and thrombolytic therapy. She developed bilateral periorbital ecchymoses and a significant fall in her hemoglobin on the day that she was transitioned from heparin to Xarelto. The Xarelto was stopped. A vena cava filter was placed. She was discharged on April 14 off anticoagulants. She presented again on April 21 with another near syncopal episode, progressive weakness, orthostatic dizziness, and bilateral swelling and pain in both of her lower extremities. She was found to have bilateral proximal venous thrombosis and a thrombosis on the caval filter. Once again she received thrombolytic therapy and within 24 hours developed extensive spontaneous ecchymoses especially in areas of minor pressure on her upper extremities where a blood pressure cuff was applied. She did get a good result with the thrombo-lysis with lysis of the  clot on the caval filter and in the right venous system with minimal residual clot on the left.  Prior to all anticoagulation she had normal pro time and PTT. Normal platelets. I checked her for the presence of a concomitant qualitative platelet disorder. Platelet function studies returned normal as did a von Willebrand's profile. Hypercoagulation profile done during the first admission was unremarkable for any obvious congenital or acquired coagulopathy.  When she was otherwise stable and there was no sign of active bleeding, I felt she needed to be on anticoagulation. She was started on Coumadin 5 mg daily prior to discharge on May 7. INR was checked in her primary care physician's office on Thursday 5/12 and returned supratherapeutic at 12. I advised 5 mg of oral vitamin K and daily monitoring of her pro time until further follow-up with me today. Fortunately, she did not experience any bleeding, new ecchymoses, or extension of her other ecchymoses which are resolving rapidly.  Only complaint today is a persistent cough productive of green sputum which she has now had for over one month and for which she is taking hydrocodone containing cough syrup at night. She is still having discomfort in her legs and arms. She is having difficulty getting up from a sitting position but is able to ambulate once she gets up.      Medications:  Current outpatient prescriptions:  .  acetaminophen (TYLENOL) 325 MG tablet, Take 2 tablets (650 mg total) by mouth every 6 (six) hours as needed for mild pain, fever or headache., Disp: , Rfl:  .  CALCIUM PO, Take 1  capsule by mouth 2 (two) times daily. 1572m twice a day, chews., Disp: , Rfl:  .  Cholecalciferol (VITAMIN D3) 5000 UNITS TABS, Take 5,000 Units by mouth daily., Disp: , Rfl:  .  iron polysaccharides (NIFEREX) 150 MG capsule, Take 150 mg by mouth daily. , Disp: , Rfl:  .  Probiotic Product (PHILLIPS COLON HEALTH PO), Take 1 capsule by mouth daily., Disp:  , Rfl:  .  ranitidine (ZANTAC) 150 MG tablet, Take 1 tablet (150 mg total) by mouth at bedtime., Disp: 30 tablet, Rfl: 2 .  warfarin (COUMADIN) 2 MG tablet, Take 1 tablet (2 mg total) by mouth daily at 6 PM. Or as directed by primary care provider, Disp: 30 tablet, Rfl: 4 .  Ascorbic Acid (VITAMIN C) 100 MG tablet, Take 100 mg by mouth daily. , Disp: , Rfl:  .  BIOTIN PO, Take 1 tablet by mouth every morning., Disp: , Rfl:  .  Hydrocodone-Homatropine 5-1.5 MG TABS, 15 ml HS as needed for cough, Disp: , Rfl:  .  lisinopril (PRINIVIL,ZESTRIL) 20 MG tablet, Take 20 mg by mouth daily., Disp: , Rfl:  .  Multiple Vitamin (MULTIVITAMIN) tablet, Take 1 tablet by mouth daily. gummies, Disp: , Rfl:  .  non-metallic deodorant (ALRA) MISC, Apply 1 application topically daily. , Disp: , Rfl:  .  Omega-3 Fatty Acids (FISH OIL) 1000 MG CAPS, Take 1 capsule by mouth 2 (two) times daily., Disp: , Rfl:  .  vitamin E 100 UNIT capsule, Take 100 Units by mouth daily., Disp: , Rfl:   Allergies: No Known Allergies  Review of Systems: See history of present illness  Remaining ROS negative:   Physical Exam: Blood pressure 136/89, pulse 119, temperature 98.5 F (36.9 C), temperature source Oral, height _0  (1.651 m), weight 194 lb 14.4 oz (88.406 kg), SpO2 96 %. Wt Readings from Last 3 Encounters:  11/07/14 194 lb 14.4 oz (88.406 kg)  10/26/14 203 lb (92.08 kg)  10/02/14 210 lb 12.2 oz (95.6 kg)     General appearance: Well-nourished Caucasian woman HENNT: Pharynx no erythema, exudate, mass, or ulcer. No thyromegaly or thyroid nodules Lymph nodes: No cervical, supraclavicular, or axillary lymphadenopathy Breasts: Not examined today Lungs: Clear to auscultation, resonant to percussion throughout. Poor air movement with diminished breath sounds throughout. Heart: Regular rhythm, no murmur, no gallop, no rub, no click, no edema Abdomen: Soft, nontender Extremities: No edema, mild calf tenderness on the right  but not the left Measurements: 42 cm on the right, 42 on the left. Ankle measurements: 23 cm on the right, 23.5 on the left Musculoskeletal: no joint deformities GU:  Vascular: Carotid pulses 2+, no bruits, distal pulses: Dorsalis pedis 1+ symmetric Neurologic: Alert, oriented, PERRLA,  cranial nerves grossly normal,  Skin:  ecchymoses on the arms 90% resolved.  Lab Results: CBC W/Diff    Component Value Date/Time   WBC 7.6 11/07/2014 1449   WBC 8.1 05/08/2014 1417   RBC 4.85 11/07/2014 1449   RBC 4.73 05/08/2014 1417   HGB 12.6 11/07/2014 1449   HGB 14.0 05/08/2014 1417   HCT 42.2 11/07/2014 1449   HCT 43.4 05/08/2014 1417   PLT 285 11/07/2014 1449   PLT 230 05/08/2014 1417   MCV 87.0 11/07/2014 1449   MCV 91.8 05/08/2014 1417   MCH 26.0 11/07/2014 1449   MCH 29.6 05/08/2014 1417   MCHC 29.9* 11/07/2014 1449   MCHC 32.3 05/08/2014 1417   RDW 21.0* 11/07/2014 1449   RDW 13.8 05/08/2014  1417   LYMPHSABS 2.1 11/07/2014 1449   LYMPHSABS 1.7 05/08/2014 1417   MONOABS 0.4 11/07/2014 1449   MONOABS 0.5 05/08/2014 1417   EOSABS 0.3 11/07/2014 1449   EOSABS 0.2 05/08/2014 1417   BASOSABS 0.0 11/07/2014 1449   BASOSABS 0.0 05/08/2014 1417     Chemistry      Component Value Date/Time   NA 139 10/26/2014 0615   NA 141 05/08/2014 1417   K 3.8 10/26/2014 0615   K 3.8 05/08/2014 1417   CL 104 10/26/2014 0615   CO2 27 10/26/2014 0615   CO2 27 05/08/2014 1417   BUN 8 10/26/2014 0615   BUN 13.7 05/08/2014 1417   CREATININE 0.65 10/26/2014 0615   CREATININE 0.8 05/08/2014 1417      Component Value Date/Time   CALCIUM 8.8* 10/26/2014 0615   CALCIUM 9.7 05/08/2014 1417   ALKPHOS 97 10/17/2014 0402   ALKPHOS 57 05/08/2014 1417   AST 42* 10/17/2014 0402   AST 18 05/08/2014 1417   ALT 47* 10/17/2014 0402   ALT 15 05/08/2014 1417   BILITOT 0.7 10/17/2014 0402   BILITOT 0.44 05/08/2014 1417    Pro time: 20.7, INR 1.8 PTT: 31 seconds   Radiological Studies: See  chart  Impression:  #1. Unprovoked, bilateral, large-volume, pulmonary emboli with concomitant right lower extremity DVT with only potential risk factor hormonal therapy with tamoxifen. Given her age, clot volume, short interval recurrent thrombotic events, I believe she needs to be on long-term anticoagulation. I do not think there is any contraindication to using Xarelto or apixiban in the future once she is otherwise stable and after the caval filter has been removed. She could still continue tamoxifen as long she is on full dose anticoagulation although changing to an aromatase inhibitor which is less thrombogenic might be a good alternative.  #2. Extensive bilateral proximal DVT and thrombosis on vena cava filter off anticoagulation for just 10 days.  #3. Thrombolytic therapy 2 at time of first and second thrombotic events.  #4. Exaggerated soft tissue bleeding following both episodes of thrombolysis which I do not feel was in any way related to the one or 2 doses of Xarelto she got initially. No evidence for another coexisting coagulopathy. See discussion above.  #5. Hypersensitivity to warfarin She is currently down to 1 mg of Coumadin daily. INR 1.8. I will increase her to 2 mg daily and check another pro time again on Monday, May 23. Further adjustments based on results.  #6. Status post placement of a vena cava filter for bleeding while on anticoagulation with heparin and thrombolytic therapy with subsequent filter thrombosis. I reinforced to her that the filter needs to be retrieved when it has been in for 3 or less months.  #7. 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 I will defer this to her medical oncologist. I did not discuss this with her today.  Total time with direct face-to-face contact with patient and her husband one hour excluding time for this dictation.  CC: Patient Care Team: Kelton Pillar, MD as PCP - General (Family Medicine) Christene Slates, MD as Physician Assistant (Radiology) Brien Few, MD as Consulting Physician (Obstetrics and Gynecology) Lafayette Dragon, MD as Consulting Physician (Gastroenterology) Consuela Mimes, MD as Consulting Physician (Oncology) Alphonsa Overall, MD as Consulting Physician (General Surgery) Eppie Gibson, MD as Consulting Physician (Radiation Oncology) Alphonsa Overall, MD as Consulting Physician (General Surgery)  Annia Belt, MD 5/19/20165:07 PM

## 2014-11-12 ENCOUNTER — Other Ambulatory Visit: Payer: Medicare Other

## 2014-11-12 ENCOUNTER — Ambulatory Visit: Payer: Medicare Other | Admitting: Hematology and Oncology

## 2014-11-19 ENCOUNTER — Telehealth: Payer: Self-pay | Admitting: *Deleted

## 2014-11-19 ENCOUNTER — Other Ambulatory Visit: Payer: Self-pay | Admitting: Oncology

## 2014-11-19 DIAGNOSIS — I2699 Other pulmonary embolism without acute cor pulmonale: Secondary | ICD-10-CM

## 2014-11-19 DIAGNOSIS — I82403 Acute embolism and thrombosis of unspecified deep veins of lower extremity, bilateral: Secondary | ICD-10-CM

## 2014-11-19 NOTE — Telephone Encounter (Signed)
Let's see if we can get her in today. I will put in order. I though her internist was going to be checking her INRs

## 2014-11-19 NOTE — Telephone Encounter (Signed)
Talked with [facility] and she will draw INR today and fax results to clinic.

## 2014-11-19 NOTE — Telephone Encounter (Signed)
I would like to repeat again in 1 week.  Tell pt to stay on same  2 mg daily for now

## 2014-11-19 NOTE — Telephone Encounter (Signed)
[facility]N informed and will inform pt Also repeat INR in 1 week.

## 2014-11-19 NOTE — Telephone Encounter (Signed)
Call from [place] with [facility] [phone]  Nurse reports that she did a PT/INR on 5/23 and they have not received an order for when the next INR is to be drawn.   I looked through the notes and do not see any report. Please advise.

## 2014-11-19 NOTE — Telephone Encounter (Signed)
Just received report from [facility] for INR Collected 5/31 @ 1230 PT - 37.4 INR - 3.1  Current dose of coumadin is 2 mg daily When would you like this collected again?

## 2014-11-26 ENCOUNTER — Telehealth: Payer: Self-pay | Admitting: *Deleted

## 2014-11-26 NOTE — Telephone Encounter (Signed)
[name] with [facility] [phone] called INR for 11/26/14 - 3.9. Pt is on Coumadin 2mg  daily. Hilda Blades Jasie Meleski RN 11/26/14 3:15PM

## 2014-11-27 NOTE — Telephone Encounter (Signed)
Message from Dr Beryle Beams given to pt and pt wanted a copy mailed to her. Note from Dr Beryle Beams - call her with results. Advise her to skip 1 dose then continue 2 mg daily except on Tuesdays. Check PT again 2 weeks. Message left on [facility] ID recording of Dr Beryle Beams message.

## 2014-12-10 ENCOUNTER — Telehealth: Payer: Self-pay | Admitting: *Deleted

## 2014-12-10 NOTE — Telephone Encounter (Signed)
Call patient: INR still a little higher than I would like @ 3.8. Have her change coumadin to 2 mg daily alternate with 1 mg daily. Do not skip days. Check PT/INR in 2 weeks - can do at [facility] or with her primary care

## 2014-12-10 NOTE — Telephone Encounter (Signed)
PT/INR were faxed to Dr Beryle Beams from [facility] from [facility]. Done 12/10/14 11:30AM INR 3.8 and PT 45.5. Pt is on Coumadin 2mg  daily except nothing on Tuesday. Pt is discharged today States pt will follow up with PCP re:PT/INR. Hilda Blades Dwan Hemmelgarn RN 12/10/14 3:20PM

## 2014-12-11 NOTE — Telephone Encounter (Signed)
Talked with pt this AM and stated she understood. Will call the day before to sch INR [facility].

## 2014-12-24 ENCOUNTER — Telehealth: Payer: Self-pay | Admitting: Hematology and Oncology

## 2014-12-24 ENCOUNTER — Encounter: Payer: Self-pay | Admitting: Hematology and Oncology

## 2014-12-24 ENCOUNTER — Other Ambulatory Visit (HOSPITAL_BASED_OUTPATIENT_CLINIC_OR_DEPARTMENT_OTHER): Payer: Medicare Other

## 2014-12-24 ENCOUNTER — Ambulatory Visit (HOSPITAL_BASED_OUTPATIENT_CLINIC_OR_DEPARTMENT_OTHER): Payer: Medicare Other | Admitting: Hematology and Oncology

## 2014-12-24 VITALS — BP 146/90 | HR 103 | Temp 98.6°F | Resp 18 | Ht 65.0 in | Wt 198.2 lb

## 2014-12-24 DIAGNOSIS — I82403 Acute embolism and thrombosis of unspecified deep veins of lower extremity, bilateral: Secondary | ICD-10-CM

## 2014-12-24 DIAGNOSIS — C4361 Malignant melanoma of right upper limb, including shoulder: Secondary | ICD-10-CM | POA: Diagnosis not present

## 2014-12-24 DIAGNOSIS — I2699 Other pulmonary embolism without acute cor pulmonale: Secondary | ICD-10-CM

## 2014-12-24 DIAGNOSIS — Z862 Personal history of diseases of the blood and blood-forming organs and certain disorders involving the immune mechanism: Secondary | ICD-10-CM | POA: Diagnosis not present

## 2014-12-24 DIAGNOSIS — Z86718 Personal history of other venous thrombosis and embolism: Secondary | ICD-10-CM

## 2014-12-24 DIAGNOSIS — C50911 Malignant neoplasm of unspecified site of right female breast: Secondary | ICD-10-CM

## 2014-12-24 DIAGNOSIS — C50219 Malignant neoplasm of upper-inner quadrant of unspecified female breast: Secondary | ICD-10-CM

## 2014-12-24 DIAGNOSIS — M79606 Pain in leg, unspecified: Secondary | ICD-10-CM | POA: Diagnosis not present

## 2014-12-24 DIAGNOSIS — C50211 Malignant neoplasm of upper-inner quadrant of right female breast: Secondary | ICD-10-CM

## 2014-12-24 LAB — CBC WITH DIFFERENTIAL/PLATELET
BASO%: 0.7 % (ref 0.0–2.0)
BASOS ABS: 0.1 10*3/uL (ref 0.0–0.1)
EOS%: 6.4 % (ref 0.0–7.0)
Eosinophils Absolute: 0.5 10*3/uL (ref 0.0–0.5)
HCT: 36.7 % (ref 34.8–46.6)
HGB: 11.8 g/dL (ref 11.6–15.9)
LYMPH%: 25.2 % (ref 14.0–49.7)
MCH: 26 pg (ref 25.1–34.0)
MCHC: 32.1 g/dL (ref 31.5–36.0)
MCV: 81 fL (ref 79.5–101.0)
MONO#: 0.7 10*3/uL (ref 0.1–0.9)
MONO%: 8.6 % (ref 0.0–14.0)
NEUT#: 5 10*3/uL (ref 1.5–6.5)
NEUT%: 59.1 % (ref 38.4–76.8)
Platelets: 323 10*3/uL (ref 145–400)
RBC: 4.53 10*6/uL (ref 3.70–5.45)
RDW: 17.6 % — ABNORMAL HIGH (ref 11.2–14.5)
WBC: 8.5 10*3/uL (ref 3.9–10.3)
lymph#: 2.1 10*3/uL (ref 0.9–3.3)

## 2014-12-24 LAB — COMPREHENSIVE METABOLIC PANEL (CC13)
ALBUMIN: 3.3 g/dL — AB (ref 3.5–5.0)
ALT: 25 U/L (ref 0–55)
ANION GAP: 11 meq/L (ref 3–11)
AST: 32 U/L (ref 5–34)
Alkaline Phosphatase: 104 U/L (ref 40–150)
BUN: 12.9 mg/dL (ref 7.0–26.0)
CO2: 26 mEq/L (ref 22–29)
Calcium: 9.7 mg/dL (ref 8.4–10.4)
Chloride: 106 mEq/L (ref 98–109)
Creatinine: 0.8 mg/dL (ref 0.6–1.1)
EGFR: 73 mL/min/{1.73_m2} — AB (ref >90)
GLUCOSE: 120 mg/dL (ref 70–140)
POTASSIUM: 3.6 meq/L (ref 3.5–5.1)
Sodium: 143 mEq/L (ref 136–145)
TOTAL PROTEIN: 6.3 g/dL — AB (ref 6.4–8.3)
Total Bilirubin: 0.39 mg/dL (ref 0.20–1.20)

## 2014-12-24 LAB — IRON AND TIBC CHCC
%SAT: 6 % — AB (ref 21–57)
IRON: 20 ug/dL — AB (ref 41–142)
TIBC: 348 ug/dL (ref 236–444)
UIBC: 327 ug/dL (ref 120–384)

## 2014-12-24 LAB — FERRITIN CHCC: FERRITIN: 24 ng/mL (ref 9–269)

## 2014-12-24 LAB — PROTIME-INR
INR: 3 (ref 2.00–3.50)
Protime: 36 Seconds — ABNORMAL HIGH (ref 10.6–13.4)

## 2014-12-24 NOTE — Assessment & Plan Note (Signed)
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   Tamoxifen toxicities: 1. 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 Return to clinic in 6 months for follow-up

## 2014-12-24 NOTE — Progress Notes (Signed)
Patient Care Team: Kelton Pillar, MD as PCP - General (Family Medicine) Christene Slates, MD as Physician Assistant (Radiology) Brien Few, MD as Consulting Physician (Obstetrics and Gynecology) Lafayette Dragon, MD as Consulting Physician (Gastroenterology) Consuela Mimes, MD as Consulting Physician (Oncology) Alphonsa Overall, MD as Consulting Physician (General Surgery) Eppie Gibson, MD as Consulting Physician (Radiation Oncology) Alphonsa Overall, MD as Consulting Physician (General Surgery) Annia Belt, MD as Consulting Physician (Oncology) Nicholas Lose, MD as Consulting Physician (Hematology and Oncology)  DIAGNOSIS: Breast cancer, right breast   Staging form: Breast, AJCC 7th Edition     Clinical: T1, N0 - Unsigned     Pathologic: Stage IA (T1c, N0, cM0) - Signed by Deatra Robinson, MD on 08/26/2013   SUMMARY OF ONCOLOGIC HISTORY:   Breast cancer, right breast   07/31/2013 Surgery Right lumpectomy: Invasive lobular cancer, LVID present, BMI present, margins negative, right arm invasive malignant melanoma, 0/4 lymph nodes negative, grade 2, 1.8 cm, T1c N0 stage IA ER 100%, PR 8%, HER-2 negative ratio 1, Ki-67 10%   09/24/2013 - 10/17/2013 Radiation Therapy Adjuvant radiation therapy 42.56 Gy in 16 fractions   12/03/2013 - 09/24/2014 Anti-estrogen oral therapy Adjuvant tamoxifen 20 mg daily 5 years   10/10/2014 - 10/26/2014 Hospital Admission 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    CHIEF COMPLIANT: stopped tamoxifen therapy due to extensive iliofemoral thrombosis  INTERVAL HISTORY: [name] is a 74 year old with above-mentioned history right breast cancer who was on tamoxifen therapy when she developed extensive iliofemoral thrombosis requiring thrombolysis and IVC filter placement. She is here today to discuss removal of IVC filter and continuation of oral anticoagulation with Coumadin. Previously seen  extensive bruising and subcutaneous hemorrhages related to Coumadin therapy but now she has been on a low dose of Coumadin and she has been doing much better. She stopped tamoxifen therapy.  REVIEW OF SYSTEMS:   Constitutional: Denies fevers, chills or abnormal weight loss Eyes: Denies blurriness of vision Ears, nose, mouth, throat, and face: Denies mucositis or sore throat Respiratory: Denies cough, dyspnea or wheezes Cardiovascular: Denies palpitation, chest discomfort or lower extremity swelling Gastrointestinal:  Denies nausea, heartburn or change in bowel habits Skin: Denies abnormal skin rashes Lymphatics: Denies new lymphadenopathy or easy bruising Neurological:Denies numbness, tingling or new weaknesses Behavioral/Psych: Mood is stable, no new changes  Breast:  denies any pain or lumps or nodules in either breasts All other systems were reviewed with the patient and are negative.  I have reviewed the past medical history, past surgical history, social history and family history with the patient and they are unchanged from previous note.  ALLERGIES:  has No Known Allergies.  MEDICATIONS:  Current Outpatient Prescriptions  Medication Sig Dispense Refill  . acetaminophen (TYLENOL) 325 MG tablet Take 2 tablets (650 mg total) by mouth every 6 (six) hours as needed for mild pain, fever or headache.    Marland Kitchen BIOTIN PO Take 1 tablet by mouth every morning.    Marland Kitchen CALCIUM PO Take 1 capsule by mouth 2 (two) times daily. 155m twice a day, chews.    . Cholecalciferol (VITAMIN D3) 5000 UNITS TABS Take 5,000 Units by mouth daily.    . Hydrocodone-Homatropine 5-1.5 MG TABS 15 ml HS as needed for cough    . iron polysaccharides (NIFEREX) 150 MG capsule Take 150 mg by mouth daily.     . non-metallic deodorant (Jethro Poling MISC Apply 1 application topically daily.     .Marland Kitchen  Probiotic Product (PHILLIPS COLON HEALTH PO) Take 1 capsule by mouth daily.    . ranitidine (ZANTAC) 150 MG tablet Take 1 tablet (150 mg  total) by mouth at bedtime. 30 tablet 2  . warfarin (COUMADIN) 2 MG tablet Take 1 tablet (2 mg total) by mouth daily at 6 PM. Or as directed by primary care provider 30 tablet 4   No current facility-administered medications for this visit.    PHYSICAL EXAMINATION: ECOG PERFORMANCE STATUS: 1 - Symptomatic but completely ambulatory  Filed Vitals:   12/24/14 1331  BP: 146/90  Pulse: 103  Temp: 98.6 F (37 C)  Resp: 18   Filed Weights   12/24/14 1331  Weight: 198 lb 3.2 oz (89.903 kg)    GENERAL:alert, no distress and comfortable SKIN: skin color, texture, turgor are normal, no rashes or significant lesions EYES: normal, Conjunctiva are pink and non-injected, sclera clear OROPHARYNX:no exudate, no erythema and lips, buccal mucosa, and tongue normal  NECK: supple, thyroid normal size, non-tender, without nodularity LYMPH:  no palpable lymphadenopathy in the cervical, axillary or inguinal LUNGS: clear to auscultation and percussion with normal breathing effort HEART: regular rate & rhythm and no murmurs and no lower extremity edema ABDOMEN:abdomen soft, non-tender and normal bowel sounds Musculoskeletal:no cyanosis of digits and no clubbing  NEURO: alert & oriented x 3 with fluent speech, no focal motor/sensory deficits   LABORATORY DATA:  I have reviewed the data as listed   Chemistry      Component Value Date/Time   NA 143 12/24/2014 1301   NA 139 10/26/2014 0615   K 3.6 12/24/2014 1301   K 3.8 10/26/2014 0615   CL 104 10/26/2014 0615   CO2 26 12/24/2014 1301   CO2 27 10/26/2014 0615   BUN 12.9 12/24/2014 1301   BUN 8 10/26/2014 0615   CREATININE 0.8 12/24/2014 1301   CREATININE 0.65 10/26/2014 0615      Component Value Date/Time   CALCIUM 9.7 12/24/2014 1301   CALCIUM 8.8* 10/26/2014 0615   ALKPHOS 104 12/24/2014 1301   ALKPHOS 97 10/17/2014 0402   AST 32 12/24/2014 1301   AST 42* 10/17/2014 0402   ALT 25 12/24/2014 1301   ALT 47* 10/17/2014 0402   BILITOT  0.39 12/24/2014 1301   BILITOT 0.7 10/17/2014 0402       Lab Results  Component Value Date   WBC 8.5 12/24/2014   HGB 11.8 12/24/2014   HCT 36.7 12/24/2014   MCV 81.0 12/24/2014   PLT 323 12/24/2014   NEUTROABS 5.0 12/24/2014    ASSESSMENT & PLAN:  Breast cancer, right breast T1No invasive lobular carcinoma of right breast: post lumpectomy with sentinel nodes, local radiation and is taking Tamoxifen daily that was started 11/01/13.   Tamoxifen toxicities: 1. 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 Leg aches and pains related to prior thrombosis: Continue with anticoagulation We will need to refer her to Coumadin clinic for active management of anticoagulation with Coumadin.  I discussed with her about switching from tamoxifen to anastrozole 1 mg daily to complete the rest of the 5 years. But patient already discontinued tamoxifen therapy and is not interested in pursuing any further antiestrogen treatment.  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 Return to clinic in 6 months for follow-up.   Orders Placed  This Encounter  Procedures  . IR IVC FILTER RETRIEVAL / S&I /IMG GUID/MOD SED    Standing Status: Future     Number of Occurrences:      Standing Expiration Date: 02/24/2016    Order Specific Question:  Reason for exam:    Answer:  Removal of IVC filter, On Coumadin    Order Specific Question:  Preferred Imaging Location?    Answer:  [facility]    Standing Status: Standing     Number of Occurrences: 52     Standing Expiration Date: 12/24/2015  . Oncology referral to [facility]    Referral Priority:  Routine    Referral Type:  Consultation    Referral Reason:  Specialty Services Required    Number of Visits Requested:  1   The  patient has a good understanding of the overall plan. she agrees with it. she will call with any problems that may develop before the next visit here.   Rulon Eisenmenger, MD

## 2014-12-25 ENCOUNTER — Telehealth: Payer: Self-pay | Admitting: Pharmacist

## 2014-12-25 DIAGNOSIS — I2699 Other pulmonary embolism without acute cor pulmonale: Secondary | ICD-10-CM

## 2014-12-25 NOTE — Telephone Encounter (Signed)
LVM on mobile number to call and schedule appmt as a new CC patient She had unprovoked PE and DVT from tamoxifen Supersensitive to coumadin INR was 12 after 5 days of coumadin 5mg  Currently on 1 or 2mg  daily. INR=3.00 on 12/24/14 Goal is 2-3 Has seen Dr. Darnell Level, see his last note for history

## 2014-12-26 ENCOUNTER — Telehealth: Payer: Self-pay

## 2014-12-26 NOTE — Telephone Encounter (Signed)
[name] returned our call from yesterday to schedule a Coumadin Clinic appointment. She states her current dose alternates each day, 1 mg one day then 2 mg the next and so forth.  (Today is a 1 mg day). She was last seen 7/5 at which time her INR was 3. We will see her back in 4 weeks: August 2nd at 2 pm for lab, and 2:15 pm for Coumadin Clinic. She reports no issues with her anticoagulation at this time, and knows to call our office in the meantime if she has any issues with her Coumadin therapy.

## 2015-01-03 ENCOUNTER — Telehealth: Payer: Self-pay | Admitting: *Deleted

## 2015-01-03 NOTE — Telephone Encounter (Signed)
Pt called - no answer; left message to call Radiology dept @ [phone] (here @ [facility]) or if no answer call Dr Geralyn Flash office.

## 2015-01-03 NOTE — Telephone Encounter (Signed)
-----   Message from Annia Belt, MD sent at 01/03/2015  9:30 AM EDT ----- CAll pt: did her primary care MD resume monitoring of her coumadin?  If not, she needs to come in foe PT/INR

## 2015-01-03 NOTE — Progress Notes (Signed)
Please call patient: did her primary care MD (Dr Kelton Pillar), resume monitoring of her coumadin? If not, she needs to come in for testing.

## 2015-01-03 NOTE — Telephone Encounter (Signed)
She should call Radiology dept to remind them and get a date for filter to come out

## 2015-01-03 NOTE — Telephone Encounter (Signed)
Pt called - stated she goes to the Coumadin Clinic at [facility]; arranged by Dr Lindi Adie. Also stated she supposed to have IVC filter removed, it has been 3 months, as ordered by Dr Lindi Adie but has heard back from anyone about when this will be done.

## 2015-01-14 ENCOUNTER — Telehealth (HOSPITAL_COMMUNITY): Payer: Self-pay | Admitting: Interventional Radiology

## 2015-01-14 NOTE — Telephone Encounter (Signed)
Returned pt's call to schedule IVC filter removal. Left VM for her to call back to schedule. JM

## 2015-01-15 ENCOUNTER — Other Ambulatory Visit: Payer: Self-pay | Admitting: Hematology and Oncology

## 2015-01-15 ENCOUNTER — Telehealth (HOSPITAL_COMMUNITY): Payer: Self-pay

## 2015-01-15 DIAGNOSIS — C50911 Malignant neoplasm of unspecified site of right female breast: Secondary | ICD-10-CM

## 2015-01-15 NOTE — Telephone Encounter (Signed)
Scheduled pt for an IVC filter retrieval on 01/16/15... Called pt to cancel procedure per Dr. Earleen Newport. He spoke with Dr. Barbie Banner and he wants to see pt in the clinic first before procedure. Called pt and informed her, gave her appt time for consult. AW

## 2015-01-16 ENCOUNTER — Ambulatory Visit (HOSPITAL_COMMUNITY): Admission: RE | Admit: 2015-01-16 | Payer: Medicare Other | Source: Ambulatory Visit

## 2015-01-21 ENCOUNTER — Other Ambulatory Visit (HOSPITAL_BASED_OUTPATIENT_CLINIC_OR_DEPARTMENT_OTHER): Payer: Medicare Other

## 2015-01-21 ENCOUNTER — Ambulatory Visit (HOSPITAL_BASED_OUTPATIENT_CLINIC_OR_DEPARTMENT_OTHER): Payer: Medicare Other | Admitting: Pharmacist

## 2015-01-21 DIAGNOSIS — I2699 Other pulmonary embolism without acute cor pulmonale: Secondary | ICD-10-CM

## 2015-01-21 DIAGNOSIS — I82429 Acute embolism and thrombosis of unspecified iliac vein: Secondary | ICD-10-CM

## 2015-01-21 LAB — PROTIME-INR
INR: 1.7 — AB (ref 2.00–3.50)
PROTIME: 20.4 s — AB (ref 10.6–13.4)

## 2015-01-21 LAB — POCT INR: INR: 1.7

## 2015-01-21 NOTE — Progress Notes (Signed)
INR = 1.7    Goal 2-3 INR is below goal range. No medication changes. Patient states she ate broccoli and asparagus both a few days ago. INR has not been checked since 12/24/14, but was therapeutic at that time. Mrs. Luff is very sensitive to Coumadin. Her history is very complicated. She was treated for a spontaneous bilateral PE and RLE DVT while taking tamoxifen last March. She was treated with heparin and thrombolytics. On the day she was transitioned from heparin to Xarelto she experienced bilateral periorbital ecchymosis. Xarelto was stopped and an IVC filter was placed. She was readmitted in April off anticoagulation with bilateral proximal venous thrombosis and a thrombosis on the caval filter. She was again treated with thrombolytics and again experienced ecchymosis. She was started on Coumadin. She has since had her Coumadin managed by [facility] and her PCP at [facility]. She states that she is aware that her IVC filter should have come out by now but the physician who doing the procedure cancelled the appt and wants to see her before removing the filter due to the complications she has experienced. She is extremely upset that the filter has not been removed yet. She and her husband are also extremely upset with the care that she received while an inpatient at [facility] and wanted [place] to be aware of this. Since she is very sensitive to Coumadin I will proceed very conservatively. She will take Coumadin 2 mg today and tomorrow, then resume 1 mg and 2 mg alternating. We will recheck INR next Tuesday 8/9. She will call and let us know what time she would like to schedule the appointment.     Theone Murdoch, PharmD

## 2015-01-28 ENCOUNTER — Ambulatory Visit (HOSPITAL_BASED_OUTPATIENT_CLINIC_OR_DEPARTMENT_OTHER): Payer: Medicare Other | Admitting: Pharmacist

## 2015-01-28 ENCOUNTER — Other Ambulatory Visit (HOSPITAL_BASED_OUTPATIENT_CLINIC_OR_DEPARTMENT_OTHER): Payer: Medicare Other

## 2015-01-28 DIAGNOSIS — I82429 Acute embolism and thrombosis of unspecified iliac vein: Secondary | ICD-10-CM

## 2015-01-28 DIAGNOSIS — I2699 Other pulmonary embolism without acute cor pulmonale: Secondary | ICD-10-CM

## 2015-01-28 LAB — POCT INR: INR: 2.1

## 2015-01-28 LAB — PROTIME-INR
INR: 2.1 (ref 2.00–3.50)
Protime: 25.2 Seconds — ABNORMAL HIGH (ref 10.6–13.4)

## 2015-01-28 NOTE — Progress Notes (Signed)
Pt seen in clinic today She is fairly new to our clinic but comes with a complicated history INR today is 2.1, she alternates daily between 1 and 2 mg doses. She has not missed any doses, no other medical changes She had many questions about her care when she spent a month at [facility] She needs to f/u with Dr. Halford Chessman, pulmonology I explained the process of [place] transitioning our clinic care back to the physicians. She will see [place] a few more times as she decides how to transition her care.    Pt will call us back with her exact f/u date for appmt She has to go home and check her schedule on her computer It needs to be the week of Aug 22

## 2015-01-28 NOTE — Patient Instructions (Signed)
Take Coumadin 1 mg and 2 mg alternating days. We will recheck INR in 2 weeks.  Call and let us know what time you would like to schedule an appointment.

## 2015-01-29 ENCOUNTER — Ambulatory Visit
Admission: RE | Admit: 2015-01-29 | Discharge: 2015-01-29 | Disposition: A | Discharge status: Home / Self Care | Payer: Medicare Other | Source: Ambulatory Visit | Attending: Hematology and Oncology | Admitting: Hematology and Oncology

## 2015-01-29 DIAGNOSIS — C50911 Malignant neoplasm of unspecified site of right female breast: Secondary | ICD-10-CM
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
## 2015-02-03 ENCOUNTER — Other Ambulatory Visit: Payer: Self-pay | Admitting: Radiology

## 2015-02-04 ENCOUNTER — Ambulatory Visit (HOSPITAL_COMMUNITY)
Admission: RE | Admit: 2015-02-04 | Discharge: 2015-02-04 | Disposition: A | Discharge status: Home / Self Care | Payer: Medicare Other | Source: Ambulatory Visit | Attending: Hematology and Oncology | Admitting: Hematology and Oncology

## 2015-02-04 ENCOUNTER — Encounter (HOSPITAL_COMMUNITY): Payer: Self-pay

## 2015-02-04 DIAGNOSIS — Z853 Personal history of malignant neoplasm of breast: Secondary | ICD-10-CM | POA: Insufficient documentation

## 2015-02-04 DIAGNOSIS — C50911 Malignant neoplasm of unspecified site of right female breast: Secondary | ICD-10-CM

## 2015-02-04 DIAGNOSIS — Z86718 Personal history of other venous thrombosis and embolism: Secondary | ICD-10-CM | POA: Insufficient documentation

## 2015-02-04 DIAGNOSIS — Z7901 Long term (current) use of anticoagulants: Secondary | ICD-10-CM | POA: Insufficient documentation

## 2015-02-04 DIAGNOSIS — Z86711 Personal history of pulmonary embolism: Secondary | ICD-10-CM | POA: Diagnosis not present

## 2015-02-04 DIAGNOSIS — Z4689 Encounter for fitting and adjustment of other specified devices: Secondary | ICD-10-CM | POA: Insufficient documentation

## 2015-02-04 LAB — POCT I-STAT CREATININE: Creatinine, Ser: 0.6 mg/dL (ref 0.44–1.00)

## 2015-02-04 MED ORDER — FENTANYL CITRATE (PF) 100 MCG/2ML IJ SOLN
INTRAMUSCULAR | Status: AC | PRN
  Administered 2015-02-04: 50 ug via INTRAVENOUS

## 2015-02-04 MED ORDER — FENTANYL CITRATE (PF) 100 MCG/2ML IJ SOLN
INTRAMUSCULAR | Status: AC
  Filled 2015-02-04: qty 2

## 2015-02-04 MED ORDER — MIDAZOLAM HCL 2 MG/2ML IJ SOLN
INTRAMUSCULAR | Status: AC
  Filled 2015-02-04: qty 2

## 2015-02-04 MED ORDER — MIDAZOLAM HCL 2 MG/2ML IJ SOLN
INTRAMUSCULAR | Status: AC | PRN
  Administered 2015-02-04: 1 mg via INTRAVENOUS

## 2015-02-04 NOTE — Sedation Documentation (Signed)
Patient denies pain and is resting comfortably.  

## 2015-02-04 NOTE — H&P (Signed)
HPI: Patient who was recently seen on 01/29/15 by Dr. Barbie Banner and is scheduled today for IVC filter retrieval. She states she is currently on coumadin and tolerating it well without bleeding. She denies any LE swelling, shortness of breath or chest pain.   The patient has had a H&P performed within the last 30 days, all history, medications, and exam have been reviewed. The patient denies any interval changes since the H&P.  Medications: Prior to Admission medications   Medication Sig Start Date End Date Taking? Authorizing Provider  acetaminophen (TYLENOL) 500 MG tablet Take 1,000 mg by mouth 3 (three) times daily as needed (pain).   Yes Historical Provider, MD  Calcium 500 MG CHEW Chew 1,000 mg by mouth 2 (two) times daily.   Yes Historical Provider, MD  cetirizine (ZYRTEC) 10 MG tablet Take 10 mg by mouth at bedtime. Allertec   Yes Historical Provider, MD  Cholecalciferol (VITAMIN D3) 5000 UNITS TABS Take 5,000 Units by mouth at bedtime.    Yes Historical Provider, MD  iron polysaccharides (NIFEREX) 150 MG capsule Take 150 mg by mouth at bedtime. 9pm   Yes Historical Provider, MD  Multiple Vitamins-Minerals (ADULT ONE DAILY GUMMIES) CHEW Chew 2 tablets by mouth at bedtime.   Yes Historical Provider, MD  Probiotic Product (PROBIOTIC PO) Take 1 tablet by mouth at bedtime.   Yes Historical Provider, MD  ranitidine (ZANTAC) 150 MG tablet Take 1 tablet (150 mg total) by mouth at bedtime. 07/05/13  Yes Lafayette Dragon, MD  warfarin (COUMADIN) 2 MG tablet Take 1 tablet (2 mg total) by mouth daily at 6 PM. Or as directed by primary care provider Patient taking differently: Take 1-2 mg by mouth at bedtime. Take 1 tablet (2 mg) 1st night, then take 1/2 tablet (1 mg) 2nd night, then repeat 11/07/14  Yes Annia Belt, MD  acetaminophen (TYLENOL) 325 MG tablet Take 2 tablets (650 mg total) by mouth every 6 (six) hours as needed for mild pain, fever or headache. Patient not taking: Reported on 02/03/2015  10/26/14   Delfina Redwood, MD  Hydrocodone-Homatropine 5-1.5 MG TABS 15 ml HS as needed for cough Patient not taking: Reported on 01/29/2015 10/31/14   Annia Belt, MD    Vital Signs: BP 162/87 mmHg  Pulse 92  Temp(Src) 97.7 F (36.5 C) (Oral)  Resp 18  Ht 5\' 5"  (1.651 m)  Wt 195 lb (88.451 kg)  BMI 32.45 kg/m2  SpO2 100%  Physical Exam  Constitutional: She is oriented to person, place, and time. No distress.  HENT:  Head: Normocephalic and atraumatic.  Cardiovascular: Normal rate and regular rhythm.  Exam reveals no gallop and no friction rub.   No murmur heard. Pulmonary/Chest: Effort normal and breath sounds normal. No respiratory distress. She has no wheezes. She has no rales.  Abdominal: Soft. Bowel sounds are normal. There is no tenderness.  Musculoskeletal: She exhibits no edema.  Neurological: She is alert and oriented to person, place, and time.  Skin: She is not diaphoretic.    Mallampati Score:  MD Evaluation Airway: WNL Heart: WNL Abdomen: WNL Chest/ Lungs: WNL ASA  Classification: 2 Mallampati/Airway Score: Two  Labs:  CBC:  Recent Labs  10/24/14 0305 10/26/14 0615 11/07/14 1449 12/24/14 1301  WBC 6.9 5.2 7.6 8.5  HGB 8.9* 9.1* 12.6 11.8  HCT 29.5* 30.9* 42.2 36.7  PLT 291 320 285 323    COAGS:  Recent Labs  10/02/14 0422  10/11/14 1055 10/17/14 0402  11/07/14 1449  01/21/15 01/21/15 1418 01/28/15 1343 01/28/15 1449  INR 1.13  < >  --  1.05  < > 1.79*  < > 1.7 1.70* 2.10 2.1  APTT 29  --  33 37  --  31  --   --   --   --   --   < > = values in this interval not displayed.  BMP:  Recent Labs  10/21/14 0313 10/22/14 0343 10/24/14 0305 10/26/14 0615 12/24/14 1301  NA 139 141 142 139 143  K 3.4* 3.7 4.0 3.8 3.6  CL 105 105 106 104  --   CO2 26 27 29 27 26   GLUCOSE 116* 110* 112* 100* 120  BUN 7 7 6 8  12.9  CALCIUM 8.0* 8.6* 8.9 8.8* 9.7  CREATININE 0.64 0.60 0.73 0.65 0.8  GFRNONAA >60 >60 >60 >60  --   GFRAA >60  >60 >60 >60  --     LIVER FUNCTION TESTS:  Recent Labs  10/10/14 2052 10/11/14 1055 10/17/14 0402 12/24/14 1301  BILITOT 0.6 0.6 0.7 0.39  AST 30 23 42* 32  ALT 19 15 47* 25  ALKPHOS 68 55 97 104  PROT 6.8 5.5* 5.4* 6.3*  ALBUMIN 3.8 3.1* 2.4* 3.3*    Assessment/Plan:  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 Lindi Adie and Dr. Barbie Banner Scheduled for retrieval of IVC filter today The patient has been NPO, on coumadin last INR 1 week ago 2.1, labs and vitals have been reviewed. Risks and Benefits discussed with the patient including, but not limited to bleeding, infection, contrast induced renal failure, filter fracture or migration which can lead to emergency surgery or even death, strut penetration with damage or irritation to adjacent structures. All of the patient's questions were answered, patient is agreeable to proceed. Consent signed and in chart.   SignedHedy Jacob 02/04/2015, 12:12 PM

## 2015-02-04 NOTE — Discharge Instructions (Signed)
Wound Care Wound care helps prevent pain and infection.  HOME CARE   Only take medicine as told by your doctor.  Clean the wound daily with mild soap and water.  Change any bandages (dressings) as told by your doctor.  Take showers. Do not take baths, swim, or do anything that puts your wound under water.  Keep all doctor visits as told. GET HELP RIGHT AWAY IF:   Yellowish-white fluid (pus) comes from the wound.  Medicine does not lessen your pain.  There is a red streak going away from the wound.  You have a fever. MAKE SURE YOU:   Understand these instructions.  Will watch your condition.  Will get help right away if you are not doing well or get worse. Document Released: 03/16/2008 Document Revised: 08/30/2011 Document Reviewed: 10/11/2010 [facility] Patient Information 2015 [place], [place]. This information is not intended to replace advice given to you by your health care provider. Make sure you discuss any questions you have with your health care provider.

## 2015-02-04 NOTE — Procedures (Signed)
IVC filter retrieval No comp/EBL

## 2015-02-11 ENCOUNTER — Other Ambulatory Visit: Payer: Medicare Other

## 2015-02-11 ENCOUNTER — Ambulatory Visit: Payer: Medicare Other

## 2015-02-13 ENCOUNTER — Ambulatory Visit (HOSPITAL_BASED_OUTPATIENT_CLINIC_OR_DEPARTMENT_OTHER): Payer: Medicare Other | Admitting: Pharmacist

## 2015-02-13 ENCOUNTER — Other Ambulatory Visit (HOSPITAL_BASED_OUTPATIENT_CLINIC_OR_DEPARTMENT_OTHER): Payer: Medicare Other

## 2015-02-13 DIAGNOSIS — I82429 Acute embolism and thrombosis of unspecified iliac vein: Secondary | ICD-10-CM

## 2015-02-13 DIAGNOSIS — I2699 Other pulmonary embolism without acute cor pulmonale: Secondary | ICD-10-CM

## 2015-02-13 LAB — POCT INR: INR: 2.1

## 2015-02-13 LAB — PROTIME-INR
INR: 2.1 (ref 2.00–3.50)
PROTIME: 25.2 s — AB (ref 10.6–13.4)

## 2015-02-13 NOTE — Patient Instructions (Addendum)
Continue taking Coumadin 1mg  alternating with 2mg  daily.  Recheck INR in 3 weeks.   Call and let us know what time you would like to schedule your next appointment.

## 2015-02-13 NOTE — Progress Notes (Signed)
INR within goal today. INR stable. No missed or extra coumadin doses. No changes in diet or medications. Usual bruising. No bleeding noted. No s/s of clotting noted. Continued decreased swelling and redness in her legs. Pt had her IVC filter removed on 02/04/15. Continue taking Coumadin 1mg  alternating with 2mg  daily.  Recheck INR in 3 weeks.   Call and let us know what time you would like to schedule your next appointment.

## 2015-03-04 ENCOUNTER — Telehealth: Payer: Self-pay

## 2015-03-04 NOTE — Telephone Encounter (Signed)
mammogram and ultrasound results rcvd from [facility] 02/19/15.  Reviewed by Dr. Lindi Adie.  Sent to scan.

## 2015-03-06 ENCOUNTER — Other Ambulatory Visit (HOSPITAL_BASED_OUTPATIENT_CLINIC_OR_DEPARTMENT_OTHER): Payer: Medicare Other

## 2015-03-06 ENCOUNTER — Ambulatory Visit (HOSPITAL_BASED_OUTPATIENT_CLINIC_OR_DEPARTMENT_OTHER): Payer: Medicare Other | Admitting: Pharmacist

## 2015-03-06 DIAGNOSIS — I82429 Acute embolism and thrombosis of unspecified iliac vein: Secondary | ICD-10-CM

## 2015-03-06 DIAGNOSIS — I2699 Other pulmonary embolism without acute cor pulmonale: Secondary | ICD-10-CM

## 2015-03-06 LAB — PROTIME-INR
INR: 1.6 — ABNORMAL LOW (ref 2.00–3.50)
PROTIME: 19.2 s — AB (ref 10.6–13.4)

## 2015-03-06 LAB — POCT INR: INR: 1.6

## 2015-03-06 NOTE — Patient Instructions (Signed)
INR below goal Take 2 mg the next 3 days. Then Continue taking Coumadin 1mg  alternating with 2mg  daily. Recheck INR in 2 weeks. On 9/29 at 11:30am for lab and 11:45am for coumadin clinic

## 2015-03-06 NOTE — Progress Notes (Signed)
INR below goal today at 1.6 (goal 2-3) Pt is doing well with no complaints Unsure why INR now below goal pt reports no changes No missed or extra doses No diet or medication changes No unusual bleeding or bruising or signs/symptoms of clotting Plan: Take 2 mg the next 3 days. Then Continue taking Coumadin 1mg  alternating with 2mg  daily. Recheck INR in 2 weeks. On 9/29 at 11:30am for lab and 11:45am for coumadin clinic (9/29 will be [name]' last coumadin clinic visit)

## 2015-03-07 ENCOUNTER — Telehealth: Payer: Self-pay | Admitting: *Deleted

## 2015-03-07 NOTE — Telephone Encounter (Signed)
This RN left a message on patient's home number instructing her to call [facility] and let us know if she has enough Coumadin 2 mg tablets for a week or do I need to call some to her pharmacy. Second, she needs a lab appointment for next Friday, 03/14/15, to recheck her PT/INR. Informed her to call [phone].

## 2015-03-12 ENCOUNTER — Telehealth: Payer: Self-pay | Admitting: Pharmacist

## 2015-03-12 NOTE — Telephone Encounter (Signed)
Pt called [facility] stating she received a call from Dr. Geralyn Flash staff on 03/07/15 instructing her to take Coumadin 2 mg daily and return for lab in 1 week.  This was how she has taken her Coumadin for the past week (not what we instructed her to do in Coumadin clinic). At this point we will sign off her Coumadin management since Dr. Lindi Adie is already managing.  Pt aware. She will come for lab only tomorrow (9/22) at 11:30 am and I have sent msg to sched to have her 9/29 lab & Coumadin clinic appts cancelled. Kennith Center, Pharm.D., CPP 03/12/2015@9 :10 AM

## 2015-03-13 ENCOUNTER — Other Ambulatory Visit (HOSPITAL_BASED_OUTPATIENT_CLINIC_OR_DEPARTMENT_OTHER): Payer: Medicare Other

## 2015-03-13 ENCOUNTER — Telehealth: Payer: Self-pay

## 2015-03-13 DIAGNOSIS — I2699 Other pulmonary embolism without acute cor pulmonale: Secondary | ICD-10-CM | POA: Diagnosis not present

## 2015-03-13 LAB — PROTIME-INR
INR: 2.3 (ref 2.00–3.50)
PROTIME: 27.6 s — AB (ref 10.6–13.4)

## 2015-03-13 NOTE — Telephone Encounter (Signed)
Per Dr. Lindi Adie, let pt know her INR results and to stay on the same dose.  Pt to come in for labs in one month.  Let pt know I would put the request in to scheduling.  Pt voiced understanding.

## 2015-03-14 ENCOUNTER — Telehealth: Payer: Self-pay | Admitting: Hematology and Oncology

## 2015-03-14 NOTE — Telephone Encounter (Signed)
Called and left a message with lab appointment °

## 2015-03-20 ENCOUNTER — Ambulatory Visit: Payer: Medicare Other

## 2015-03-20 ENCOUNTER — Other Ambulatory Visit: Payer: Medicare Other

## 2015-04-10 ENCOUNTER — Inpatient Hospital Stay (HOSPITAL_COMMUNITY): Admission: RE | Admit: 2015-04-10 | Payer: Medicare Other | Source: Ambulatory Visit

## 2015-04-10 ENCOUNTER — Telehealth: Payer: Self-pay | Admitting: Hematology and Oncology

## 2015-04-10 ENCOUNTER — Other Ambulatory Visit (HOSPITAL_BASED_OUTPATIENT_CLINIC_OR_DEPARTMENT_OTHER): Payer: Medicare Other

## 2015-04-10 DIAGNOSIS — I82513 Chronic embolism and thrombosis of femoral vein, bilateral: Secondary | ICD-10-CM

## 2015-04-10 DIAGNOSIS — I2699 Other pulmonary embolism without acute cor pulmonale: Secondary | ICD-10-CM

## 2015-04-10 DIAGNOSIS — C4361 Malignant melanoma of right upper limb, including shoulder: Secondary | ICD-10-CM

## 2015-04-10 DIAGNOSIS — C50911 Malignant neoplasm of unspecified site of right female breast: Secondary | ICD-10-CM

## 2015-04-10 DIAGNOSIS — Z86718 Personal history of other venous thrombosis and embolism: Secondary | ICD-10-CM

## 2015-04-10 LAB — PROTIME-INR
INR: 2.1 (ref 2.00–3.50)
Protime: 25.2 Seconds — ABNORMAL HIGH (ref 10.6–13.4)

## 2015-04-10 NOTE — Progress Notes (Signed)
Pt requesting to talk with Dr. Lindi Adie.  Spoke with patient in lobby - pt needs confirmation that tamoxifen causes clots for her cancer policy.   Writer printed tamoxifen information and left at front desk for pt pickup.   Pt also wants to know when she can come off coumadin.  She is having problems with her feet swelling and painful - thought it was caused by the coumadin.  Let pt know coumadin not causing.  Pt voiced understanding.  Pt INR 2.1.  Pt reports she is taking 2 mg/day.  No change in diet or meds.  No abnormal bruising, bleeding or clotting, no upcoming procedures.  Pt to continue on same dose.  Recheck in one month.  Pt voiced understanding.    POF sent for lab appt in one month.    Per Dr. Lindi Adie - patient needs dopplers to check for clots in her legs.  Doppler set up for 1245 today at [facility].  LMOVM for pt with appt d/t and requested call back to confirm receipt of message.  Called pt again - she states she cannot make the 1245 appt because she is "on line to eat at the [employer]".  Writer Provided pt with phone number for vascular scheduling to reschedule.  Pt voiced understanding.

## 2015-04-10 NOTE — Telephone Encounter (Signed)
Called patient and left a message with her lab appointment

## 2015-04-11 ENCOUNTER — Ambulatory Visit (HOSPITAL_COMMUNITY)
Admission: RE | Admit: 2015-04-11 | Discharge: 2015-04-11 | Disposition: A | Discharge status: Home / Self Care | Payer: Medicare Other | Source: Ambulatory Visit | Attending: Hematology and Oncology | Admitting: Hematology and Oncology

## 2015-04-11 ENCOUNTER — Other Ambulatory Visit: Payer: Self-pay

## 2015-04-11 ENCOUNTER — Telehealth: Payer: Self-pay

## 2015-04-11 DIAGNOSIS — R609 Edema, unspecified: Secondary | ICD-10-CM

## 2015-04-11 DIAGNOSIS — M79604 Pain in right leg: Secondary | ICD-10-CM | POA: Insufficient documentation

## 2015-04-11 NOTE — Progress Notes (Signed)
VASCULAR LAB PRELIMINARY  PRELIMINARY  PRELIMINARY  PRELIMINARY  VASCULAR LAB PRELIMINARY  PRELIMINARY  PRELIMINARY  PRELIMINARY  Bilateral lower extremity venous duplex completed.    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 Also noted chronic DVT of the mid femora, popliteal, and a small section of the posterior tibial veins. Left -  Chronic DVT of the femoral, and popliteal veins. There is an occlusive superfical thrombus of the lesser saphenous vein. No evidence of a Baker's cyst of the right or left lower extremity  [name], RVs 04/11/2015, 12:44 PM

## 2015-04-11 NOTE — Progress Notes (Signed)
Pt notified of doppler results and per Dr. Lindi Adie she needs to remain on the coumadin.  Let ptk now tamoxifen info is at front desk for pick up.  Pt voiced understanding.

## 2015-04-19 NOTE — Telephone Encounter (Signed)
error 

## 2015-05-02 ENCOUNTER — Other Ambulatory Visit: Payer: Self-pay | Admitting: Oncology

## 2015-05-05 NOTE — Telephone Encounter (Signed)
Chart reviewed.

## 2015-05-12 ENCOUNTER — Other Ambulatory Visit (HOSPITAL_BASED_OUTPATIENT_CLINIC_OR_DEPARTMENT_OTHER): Payer: Medicare Other

## 2015-05-12 DIAGNOSIS — I2699 Other pulmonary embolism without acute cor pulmonale: Secondary | ICD-10-CM | POA: Diagnosis not present

## 2015-05-12 LAB — PROTIME-INR
INR: 3.7 — AB (ref 2.00–3.50)
PROTIME: 44.4 s — AB (ref 10.6–13.4)

## 2015-05-13 ENCOUNTER — Other Ambulatory Visit: Payer: Self-pay | Admitting: *Deleted

## 2015-05-13 ENCOUNTER — Telehealth: Payer: Self-pay | Admitting: *Deleted

## 2015-05-13 NOTE — Telephone Encounter (Signed)
INR 11/21 3.7. Reviewed with Dr. Lindi Adie. Patient taking 2mg  Coumadin daily, no change in medications or diet. Patient advised to hold dose today and take 1mg  on M/W/F and 2mg  on T/TH/SAT/SUN and we will recheck in 1 month. Patient verbalized understanding.

## 2015-05-14 ENCOUNTER — Other Ambulatory Visit: Payer: Self-pay | Admitting: *Deleted

## 2015-05-23 ENCOUNTER — Other Ambulatory Visit: Payer: Self-pay

## 2015-05-26 ENCOUNTER — Telehealth: Payer: Self-pay | Admitting: Hematology and Oncology

## 2015-05-26 NOTE — Telephone Encounter (Signed)
Called and left a message with 12/21 appointments

## 2015-06-05 ENCOUNTER — Ambulatory Visit: Payer: Medicare Other | Admitting: Hematology and Oncology

## 2015-06-09 ENCOUNTER — Other Ambulatory Visit: Payer: Self-pay | Admitting: *Deleted

## 2015-06-09 ENCOUNTER — Other Ambulatory Visit: Payer: Self-pay | Admitting: Hematology and Oncology

## 2015-06-09 DIAGNOSIS — Z86718 Personal history of other venous thrombosis and embolism: Secondary | ICD-10-CM

## 2015-06-09 MED ORDER — WARFARIN SODIUM 2 MG PO TABS: ORAL_TABLET | ORAL | Status: DC

## 2015-06-10 ENCOUNTER — Other Ambulatory Visit: Payer: Self-pay | Admitting: *Deleted

## 2015-06-10 NOTE — Assessment & Plan Note (Signed)
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   Tamoxifen toxicities: 1. 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 Leg aches and pains related to prior thrombosis: Continue with anticoagulation We will need to refer her to Coumadin clinic for active management of anticoagulation with Coumadin.  I discussed with her about switching from tamoxifen to anastrozole 1 mg daily to complete the rest of the 5 years. But patient already discontinued tamoxifen therapy and is not interested in pursuing any further antiestrogen treatment.  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 Return to clinic in 1 year for follow-up.

## 2015-06-11 ENCOUNTER — Telehealth: Payer: Self-pay | Admitting: *Deleted

## 2015-06-11 ENCOUNTER — Other Ambulatory Visit: Payer: Self-pay | Admitting: *Deleted

## 2015-06-11 ENCOUNTER — Ambulatory Visit (HOSPITAL_BASED_OUTPATIENT_CLINIC_OR_DEPARTMENT_OTHER): Payer: Medicare Other | Admitting: Hematology and Oncology

## 2015-06-11 ENCOUNTER — Other Ambulatory Visit (HOSPITAL_BASED_OUTPATIENT_CLINIC_OR_DEPARTMENT_OTHER): Payer: Medicare Other

## 2015-06-11 ENCOUNTER — Encounter: Payer: Self-pay | Admitting: Hematology and Oncology

## 2015-06-11 VITALS — BP 149/74 | HR 101 | Temp 98.0°F | Resp 19 | Ht 65.0 in | Wt 209.1 lb

## 2015-06-11 DIAGNOSIS — C4361 Malignant melanoma of right upper limb, including shoulder: Secondary | ICD-10-CM

## 2015-06-11 DIAGNOSIS — Z853 Personal history of malignant neoplasm of breast: Secondary | ICD-10-CM

## 2015-06-11 DIAGNOSIS — Z7901 Long term (current) use of anticoagulants: Secondary | ICD-10-CM

## 2015-06-11 DIAGNOSIS — Z86718 Personal history of other venous thrombosis and embolism: Secondary | ICD-10-CM

## 2015-06-11 DIAGNOSIS — Z85820 Personal history of malignant melanoma of skin: Secondary | ICD-10-CM

## 2015-06-11 DIAGNOSIS — C50511 Malignant neoplasm of lower-outer quadrant of right female breast: Secondary | ICD-10-CM

## 2015-06-11 DIAGNOSIS — I2699 Other pulmonary embolism without acute cor pulmonale: Secondary | ICD-10-CM

## 2015-06-11 DIAGNOSIS — I82523 Chronic embolism and thrombosis of iliac vein, bilateral: Secondary | ICD-10-CM

## 2015-06-11 LAB — PROTIME-INR
INR: 1.7 — ABNORMAL LOW (ref 2.00–3.50)
PROTIME: 20.4 s — AB (ref 10.6–13.4)

## 2015-06-11 MED ORDER — WARFARIN SODIUM 2 MG PO TABS: ORAL_TABLET | ORAL | Status: DC

## 2015-06-11 NOTE — Progress Notes (Signed)
Patient Care Team: Kelton Pillar, MD as PCP - General (Family Medicine) Christene Slates, MD as Physician Assistant (Radiology) Brien Few, MD as Consulting Physician (Obstetrics and Gynecology) Lafayette Dragon, MD as Consulting Physician (Gastroenterology) Consuela Mimes, MD as Consulting Physician (Oncology) Alphonsa Overall, MD as Consulting Physician (General Surgery) Eppie Gibson, MD as Consulting Physician (Radiation Oncology) Alphonsa Overall, MD as Consulting Physician (General Surgery) Annia Belt, MD as Consulting Physician (Oncology) Nicholas Lose, MD as Consulting Physician (Hematology and Oncology)  DIAGNOSIS: Breast cancer, right breast [facility])   Staging form: Breast, AJCC 7th Edition     Clinical: T1, N0 - Unsigned     Pathologic: Stage IA (T1c, N0, cM0) - Signed by Deatra Robinson, MD on 08/26/2013  SUMMARY OF ONCOLOGIC HISTORY:   Breast cancer, right breast ([place])   07/31/2013 Surgery Right lumpectomy: Invasive lobular cancer, LVID present, BMI present, margins negative, right arm invasive malignant melanoma, 0/4 lymph nodes negative, grade 2, 1.8 cm, T1c N0 stage IA ER 100%, PR 8%, HER-2 negative ratio 1, Ki-67 10%   09/24/2013 - 10/17/2013 Radiation Therapy Adjuvant radiation therapy 42.56 Gy in 16 fractions   12/03/2013 - 09/24/2014 Anti-estrogen oral therapy Adjuvant tamoxifen 20 mg daily 5 years   10/10/2014 - 10/26/2014 Hospital Admission 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    CHIEF COMPLIANT: follow-up of breast cancer and history of extensive iliofemoral thrombosis  INTERVAL HISTORY: [name] is a 74 year old with above-mentioned history of right breast cancer who took antiestrogen therapy for 1 year and discontinued tamoxifen when she was diagnosed with extensive iliofemoral thrombosis. She underwent thrombolysis and an IVC filter placement. IVC filter was removed in August 2016. She comes  in for routine follow-up and reports no new problems or concerns other than the chronic leg aches and pains and mild swelling. She is otherwise doing quite well. She had a mammogram at [facility] which is apparently normal.  REVIEW OF SYSTEMS:   Constitutional: Denies fevers, chills or abnormal weight loss Eyes: Denies blurriness of vision Ears, nose, mouth, throat, and face: Denies mucositis or sore throat Respiratory: Denies cough, dyspnea or wheezes Cardiovascular: Denies palpitation, chest discomfort; slight leg swelling Gastrointestinal:  Denies nausea, heartburn or change in bowel habits Skin: Denies abnormal skin rashes Lymphatics: Denies new lymphadenopathy or easy bruising Neurological:Denies numbness, tingling or new weaknesses Behavioral/Psych: Mood is stable, no new changes   All other systems were reviewed with the patient and are negative.  I have reviewed the past medical history, past surgical history, social history and family history with the patient and they are unchanged from previous note.  ALLERGIES:  has No Known Allergies.  MEDICATIONS:  Current Outpatient Prescriptions  Medication Sig Dispense Refill  . acetaminophen (TYLENOL) 325 MG tablet Take 2 tablets (650 mg total) by mouth every 6 (six) hours as needed for mild pain, fever or headache. (Patient not taking: Reported on 02/03/2015)    . acetaminophen (TYLENOL) 500 MG tablet Take 1,000 mg by mouth 3 (three) times daily as needed (pain).    . Calcium 500 MG CHEW Chew 1,000 mg by mouth 2 (two) times daily.    . cetirizine (ZYRTEC) 10 MG tablet Take 10 mg by mouth at bedtime. Allertec    . Cholecalciferol (VITAMIN D3) 5000 UNITS TABS Take 5,000 Units by mouth at bedtime.     Marland Kitchen Hydrocodone-Homatropine 5-1.5 MG TABS 15 ml HS as needed for cough (Patient not taking: Reported on 01/29/2015)    .  iron polysaccharides (NIFEREX) 150 MG capsule Take 150 mg by mouth at bedtime. 9pm    . Multiple Vitamins-Minerals (ADULT ONE  DAILY GUMMIES) CHEW Chew 2 tablets by mouth at bedtime.    . Probiotic Product (PROBIOTIC PO) Take 1 tablet by mouth at bedtime.    . ranitidine (ZANTAC) 150 MG tablet Take 1 tablet (150 mg total) by mouth at bedtime. 30 tablet 2  . warfarin (COUMADIN) 2 MG tablet TAKE ONE TABLET BY MOUTH ONCE DAILY AT 6 PM. OR AS DIRECTED BY PRIMARY CARE PROVIDER 30 tablet 0   No current facility-administered medications for this visit.    PHYSICAL EXAMINATION: ECOG PERFORMANCE STATUS: 1 - Symptomatic but completely ambulatory  Filed Vitals:   06/11/15 1459  BP: 149/74  Pulse: 101  Temp: 98 F (36.7 C)  Resp: 19   Filed Weights   06/11/15 1459  Weight: 209 lb 1.6 oz (94.847 kg)    GENERAL:alert, no distress and comfortable SKIN: skin color, texture, turgor are normal, no rashes or significant lesions EYES: normal, Conjunctiva are pink and non-injected, sclera clear OROPHARYNX:no exudate, no erythema and lips, buccal mucosa, and tongue normal  NECK: supple, thyroid normal size, non-tender, without nodularity LYMPH:  no palpable lymphadenopathy in the cervical, axillary or inguinal LUNGS: clear to auscultation and percussion with normal breathing effort HEART: regular rate & rhythm and no murmurs and no lower extremity edema ABDOMEN:abdomen soft, non-tender and normal bowel sounds Musculoskeletal:no cyanosis of digits and no clubbing  NEURO: alert & oriented x 3 with fluent speech, no focal motor/sensory deficits  LABORATORY DATA:  I have reviewed the data as listed   Chemistry      Component Value Date/Time   NA 143 12/24/2014 1301   NA 139 10/26/2014 0615   K 3.6 12/24/2014 1301   K 3.8 10/26/2014 0615   CL 104 10/26/2014 0615   CO2 26 12/24/2014 1301   CO2 27 10/26/2014 0615   BUN 12.9 12/24/2014 1301   BUN 8 10/26/2014 0615   CREATININE 0.60 02/04/2015 1226   CREATININE 0.8 12/24/2014 1301      Component Value Date/Time   CALCIUM 9.7 12/24/2014 1301   CALCIUM 8.8* 10/26/2014  0615   ALKPHOS 104 12/24/2014 1301   ALKPHOS 97 10/17/2014 0402   AST 32 12/24/2014 1301   AST 42* 10/17/2014 0402   ALT 25 12/24/2014 1301   ALT 47* 10/17/2014 0402   BILITOT 0.39 12/24/2014 1301   BILITOT 0.7 10/17/2014 0402       Lab Results  Component Value Date   WBC 8.5 12/24/2014   HGB 11.8 12/24/2014   HCT 36.7 12/24/2014   MCV 81.0 12/24/2014   PLT 323 12/24/2014   NEUTROABS 5.0 12/24/2014   ASSESSMENT & PLAN:  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 T1No invasive lobular carcinoma of right breast: post lumpectomy with sentinel nodes, local radiation and is taking Tamoxifen daily that was started 11/01/13.   Tamoxifen toxicities: 1. 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 Leg aches and pains related to prior thrombosis: Continue with anticoagulation We will need to refer her to Coumadin clinic for active management of anticoagulation with Coumadin.  I discussed with her about switching from tamoxifen to anastrozole 1 mg daily to complete the rest of the 5 years. But patient already discontinued tamoxifen therapy and is not interested in pursuing any further antiestrogen treatment.  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 Current treatment: Coumadin therapy, INR 1.7, patient alternates 2 mg with 1 mg dose. I renewed her prescription today. She gets monthly blood count checks.  Return to clinic in 1 year for follow-up.  No orders of the defined types were placed in this encounter.   The patient has a good understanding of the overall plan. she agrees with it. she will call with any problems that may develop before the next visit here.   Rulon Eisenmenger, MD 06/11/2015

## 2015-06-11 NOTE — Telephone Encounter (Signed)
INR 1.7, keep same dose and recheck in 1 month.

## 2015-06-12 ENCOUNTER — Telehealth: Payer: Self-pay | Admitting: Hematology and Oncology

## 2015-06-12 NOTE — Telephone Encounter (Signed)
Called with one month lab

## 2015-06-27 ENCOUNTER — Ambulatory Visit: Payer: Medicare Other | Admitting: Hematology and Oncology

## 2015-07-08 ENCOUNTER — Other Ambulatory Visit (HOSPITAL_BASED_OUTPATIENT_CLINIC_OR_DEPARTMENT_OTHER): Payer: Medicare Other

## 2015-07-08 DIAGNOSIS — Z86718 Personal history of other venous thrombosis and embolism: Secondary | ICD-10-CM | POA: Diagnosis not present

## 2015-07-08 LAB — PROTIME-INR
INR: 1.6 — AB (ref 2.00–3.50)
PROTIME: 19.2 s — AB (ref 10.6–13.4)

## 2015-07-09 ENCOUNTER — Other Ambulatory Visit: Payer: Self-pay | Admitting: *Deleted

## 2015-07-09 ENCOUNTER — Telehealth: Payer: Self-pay | Admitting: *Deleted

## 2015-07-09 NOTE — Telephone Encounter (Signed)
INR 1.6  Discussed with Dr. Lindi Adie, patient advised to increase Coumadin to 2 mg daily and recheck labs in 1 month. She verbalized understanding.

## 2015-07-10 ENCOUNTER — Telehealth: Payer: Self-pay | Admitting: Hematology and Oncology

## 2015-07-10 ENCOUNTER — Other Ambulatory Visit: Payer: Medicare Other

## 2015-07-10 NOTE — Telephone Encounter (Signed)
Called and left a message with one month lab  anne

## 2015-08-06 ENCOUNTER — Other Ambulatory Visit: Payer: Self-pay

## 2015-08-06 ENCOUNTER — Other Ambulatory Visit (HOSPITAL_BASED_OUTPATIENT_CLINIC_OR_DEPARTMENT_OTHER): Payer: Medicare Other

## 2015-08-06 DIAGNOSIS — I2699 Other pulmonary embolism without acute cor pulmonale: Secondary | ICD-10-CM

## 2015-08-06 LAB — PROTIME-INR
INR: 2.4 (ref 2.00–3.50)
Protime: 28.8 Seconds — ABNORMAL HIGH (ref 10.6–13.4)

## 2015-08-06 NOTE — Progress Notes (Signed)
LMOVM - pt to remain at current dose, lab check in one month.

## 2015-08-09 ENCOUNTER — Telehealth: Payer: Self-pay | Admitting: Hematology and Oncology

## 2015-08-09 NOTE — Telephone Encounter (Signed)
Left message re appointment for 3/15. Other appointments remain the same. Schedule mailed.

## 2015-09-03 ENCOUNTER — Telehealth: Payer: Self-pay | Admitting: *Deleted

## 2015-09-03 ENCOUNTER — Other Ambulatory Visit: Payer: Self-pay | Admitting: *Deleted

## 2015-09-03 ENCOUNTER — Other Ambulatory Visit (HOSPITAL_BASED_OUTPATIENT_CLINIC_OR_DEPARTMENT_OTHER): Payer: Medicare Other

## 2015-09-03 ENCOUNTER — Telehealth: Payer: Self-pay | Admitting: Hematology and Oncology

## 2015-09-03 DIAGNOSIS — I2699 Other pulmonary embolism without acute cor pulmonale: Secondary | ICD-10-CM | POA: Diagnosis not present

## 2015-09-03 LAB — PROTIME-INR
INR: 2.6 (ref 2.00–3.50)
PROTIME: 31.2 s — AB (ref 10.6–13.4)

## 2015-09-03 NOTE — Telephone Encounter (Signed)
INR 2.6. Reviewed with Dr. Lindi Adie. Left VMM for patient to continue same dose of Coumadin and we will recheck in 2 months.

## 2015-09-03 NOTE — Telephone Encounter (Signed)
lft msg for pt confirming labs added per 03/15 POF, mailed out schedule... KJ

## 2015-11-03 ENCOUNTER — Other Ambulatory Visit (HOSPITAL_BASED_OUTPATIENT_CLINIC_OR_DEPARTMENT_OTHER): Payer: Medicare Other

## 2015-11-03 ENCOUNTER — Telehealth: Payer: Self-pay

## 2015-11-03 ENCOUNTER — Telehealth: Payer: Self-pay | Admitting: Hematology and Oncology

## 2015-11-03 DIAGNOSIS — I2699 Other pulmonary embolism without acute cor pulmonale: Secondary | ICD-10-CM

## 2015-11-03 LAB — PROTIME-INR
INR: 2.6 (ref 2.00–3.50)
Protime: 31.2 Seconds — ABNORMAL HIGH (ref 10.6–13.4)

## 2015-11-03 NOTE — Telephone Encounter (Signed)
s.w. pt and gv new appt....pt ok and aware °

## 2015-11-03 NOTE — Telephone Encounter (Signed)
Called to inform pt of today's INR level of 2.60 which remains in therapeutic range.  Reviewed this with Lindi Adie, MD who wishes for pt to remain on current dose of Coumadin and return for lab work in 2 months.  All information given to patient and informed pt she would receive a call from our scheduler's once this appointment was set.  Pt informed to call us with any further questions or concerns.

## 2015-12-26 ENCOUNTER — Other Ambulatory Visit: Payer: Self-pay

## 2015-12-26 DIAGNOSIS — Z86718 Personal history of other venous thrombosis and embolism: Secondary | ICD-10-CM

## 2015-12-29 ENCOUNTER — Telehealth: Payer: Self-pay

## 2015-12-29 ENCOUNTER — Other Ambulatory Visit (HOSPITAL_BASED_OUTPATIENT_CLINIC_OR_DEPARTMENT_OTHER): Payer: Medicare Other

## 2015-12-29 DIAGNOSIS — I2699 Other pulmonary embolism without acute cor pulmonale: Secondary | ICD-10-CM

## 2015-12-29 DIAGNOSIS — Z86718 Personal history of other venous thrombosis and embolism: Secondary | ICD-10-CM

## 2015-12-29 LAB — PROTIME-INR
INR: 2.2 (ref 2.00–3.50)
Protime: 26.4 Seconds — ABNORMAL HIGH (ref 10.6–13.4)

## 2015-12-29 NOTE — Telephone Encounter (Signed)
Spoke with pt re: INT.  Pt to remain on same dose.  Recheck in 2 months.  POF entered.

## 2015-12-30 ENCOUNTER — Telehealth: Payer: Self-pay | Admitting: Hematology and Oncology

## 2015-12-30 NOTE — Telephone Encounter (Signed)
Spoke with pt to confirm 9/12 labb appt for 1115 am

## 2016-02-24 ENCOUNTER — Telehealth: Payer: Self-pay

## 2016-02-24 NOTE — Telephone Encounter (Signed)
Received VM from [place] with [facility] who is requesting a call back in regards to coumadin management with upcoming core biopsy.  Biopsy scheduled for 9/11 and per Dr. Lindi Adie pt is to hold coumadin 3 days prior to biopsy and resume day after biopsy.  This information reported to [place] with [facility] who verbalized she will inform the pt of this information.  No further questions or concerns at time of call.

## 2016-03-01 ENCOUNTER — Telehealth: Payer: Self-pay | Admitting: Hematology and Oncology

## 2016-03-01 ENCOUNTER — Encounter: Payer: Self-pay | Admitting: Hematology and Oncology

## 2016-03-01 ENCOUNTER — Other Ambulatory Visit: Payer: Medicare Other

## 2016-03-01 ENCOUNTER — Other Ambulatory Visit: Payer: Self-pay | Admitting: *Deleted

## 2016-03-01 ENCOUNTER — Other Ambulatory Visit: Payer: Self-pay | Admitting: Radiology

## 2016-03-01 DIAGNOSIS — Z86718 Personal history of other venous thrombosis and embolism: Secondary | ICD-10-CM

## 2016-03-01 NOTE — Telephone Encounter (Signed)
03/02/2016 Appointment rescheduled to 03/09/2016 per patient request.

## 2016-03-02 ENCOUNTER — Other Ambulatory Visit: Payer: Medicare Other

## 2016-03-09 ENCOUNTER — Other Ambulatory Visit: Payer: Self-pay | Admitting: Hematology and Oncology

## 2016-03-09 ENCOUNTER — Other Ambulatory Visit (HOSPITAL_BASED_OUTPATIENT_CLINIC_OR_DEPARTMENT_OTHER): Payer: Medicare Other

## 2016-03-09 DIAGNOSIS — Z86718 Personal history of other venous thrombosis and embolism: Secondary | ICD-10-CM

## 2016-03-09 DIAGNOSIS — I2699 Other pulmonary embolism without acute cor pulmonale: Secondary | ICD-10-CM | POA: Diagnosis not present

## 2016-03-09 LAB — PROTIME-INR
INR: 1.5 — AB (ref 2.00–3.50)
Protime: 18 Seconds — ABNORMAL HIGH (ref 10.6–13.4)

## 2016-03-09 MED ORDER — LETROZOLE 2.5 MG PO TABS: 2.5000 mg | ORAL_TABLET | Freq: Every day | ORAL | 3 refills | Status: DC

## 2016-03-09 NOTE — Progress Notes (Signed)
Received INR results and pt found to be subtherapeutic at 1.5.  Reviewed with Dr. Lindi Adie who stated pt is to stay on same dose as pt has just recently resumed medication after holding for procedure.  Also, pt is now willing to start letrozole per Dr. Lindi Adie.  Prescription called in and medication discussed with pt.  Pt without further questions or concerns at this time.

## 2016-03-15 MED ORDER — ATROPINE SULFATE 1 MG/ML IJ SOLN
INTRAMUSCULAR | Status: AC
  Filled 2016-03-15: qty 1

## 2016-03-18 ENCOUNTER — Other Ambulatory Visit: Payer: Self-pay | Admitting: Surgery

## 2016-04-06 ENCOUNTER — Other Ambulatory Visit: Payer: Self-pay

## 2016-04-06 ENCOUNTER — Encounter (HOSPITAL_COMMUNITY): Payer: Self-pay

## 2016-04-06 ENCOUNTER — Encounter (HOSPITAL_COMMUNITY)
Admission: RE | Admit: 2016-04-06 | Discharge: 2016-04-06 | Disposition: A | Discharge status: Home / Self Care | Payer: Medicare Other | Source: Ambulatory Visit | Attending: Surgery | Admitting: Surgery

## 2016-04-06 DIAGNOSIS — K449 Diaphragmatic hernia without obstruction or gangrene: Secondary | ICD-10-CM | POA: Diagnosis not present

## 2016-04-06 DIAGNOSIS — D649 Anemia, unspecified: Secondary | ICD-10-CM | POA: Diagnosis not present

## 2016-04-06 DIAGNOSIS — Z823 Family history of stroke: Secondary | ICD-10-CM | POA: Diagnosis not present

## 2016-04-06 DIAGNOSIS — Z86010 Personal history of colonic polyps: Secondary | ICD-10-CM | POA: Diagnosis not present

## 2016-04-06 DIAGNOSIS — Z836 Family history of other diseases of the respiratory system: Secondary | ICD-10-CM | POA: Diagnosis not present

## 2016-04-06 DIAGNOSIS — Z8249 Family history of ischemic heart disease and other diseases of the circulatory system: Secondary | ICD-10-CM | POA: Diagnosis not present

## 2016-04-06 DIAGNOSIS — I10 Essential (primary) hypertension: Secondary | ICD-10-CM | POA: Diagnosis not present

## 2016-04-06 DIAGNOSIS — Z86718 Personal history of other venous thrombosis and embolism: Secondary | ICD-10-CM | POA: Diagnosis not present

## 2016-04-06 DIAGNOSIS — Z79899 Other long term (current) drug therapy: Secondary | ICD-10-CM | POA: Diagnosis not present

## 2016-04-06 DIAGNOSIS — E7800 Pure hypercholesterolemia, unspecified: Secondary | ICD-10-CM | POA: Diagnosis not present

## 2016-04-06 DIAGNOSIS — E669 Obesity, unspecified: Secondary | ICD-10-CM | POA: Diagnosis not present

## 2016-04-06 DIAGNOSIS — F329 Major depressive disorder, single episode, unspecified: Secondary | ICD-10-CM | POA: Diagnosis not present

## 2016-04-06 DIAGNOSIS — G20 Parkinson's disease: Secondary | ICD-10-CM | POA: Diagnosis not present

## 2016-04-06 DIAGNOSIS — E559 Vitamin D deficiency, unspecified: Secondary | ICD-10-CM | POA: Diagnosis not present

## 2016-04-06 DIAGNOSIS — K219 Gastro-esophageal reflux disease without esophagitis: Secondary | ICD-10-CM | POA: Diagnosis not present

## 2016-04-06 DIAGNOSIS — Z9882 Breast implant status: Secondary | ICD-10-CM | POA: Diagnosis not present

## 2016-04-06 DIAGNOSIS — Z7901 Long term (current) use of anticoagulants: Secondary | ICD-10-CM | POA: Diagnosis not present

## 2016-04-06 DIAGNOSIS — L905 Scar conditions and fibrosis of skin: Secondary | ICD-10-CM | POA: Diagnosis present

## 2016-04-06 DIAGNOSIS — Z6832 Body mass index (BMI) 32.0-32.9, adult: Secondary | ICD-10-CM | POA: Diagnosis not present

## 2016-04-06 DIAGNOSIS — Z853 Personal history of malignant neoplasm of breast: Secondary | ICD-10-CM | POA: Diagnosis not present

## 2016-04-06 DIAGNOSIS — M797 Fibromyalgia: Secondary | ICD-10-CM | POA: Diagnosis not present

## 2016-04-06 DIAGNOSIS — Z85820 Personal history of malignant melanoma of skin: Secondary | ICD-10-CM | POA: Diagnosis not present

## 2016-04-06 HISTORY — DX: Essential (primary) hypertension: I10

## 2016-04-06 HISTORY — DX: Dyspnea, unspecified: R06.00

## 2016-04-06 LAB — CBC WITH DIFFERENTIAL/PLATELET
BASOS PCT: 1 %
Basophils Absolute: 0.1 10*3/uL (ref 0.0–0.1)
EOS ABS: 0.6 10*3/uL (ref 0.0–0.7)
Eosinophils Relative: 6 %
HCT: 37.6 % (ref 36.0–46.0)
Hemoglobin: 11.2 g/dL — ABNORMAL LOW (ref 12.0–15.0)
LYMPHS PCT: 25 %
Lymphs Abs: 2.4 10*3/uL (ref 0.7–4.0)
MCH: 19.7 pg — AB (ref 26.0–34.0)
MCHC: 29.8 g/dL — ABNORMAL LOW (ref 30.0–36.0)
MCV: 66.2 fL — AB (ref 78.0–100.0)
MONO ABS: 0.8 10*3/uL (ref 0.1–1.0)
Monocytes Relative: 8 %
NEUTROS ABS: 5.7 10*3/uL (ref 1.7–7.7)
Neutrophils Relative %: 60 %
PLATELETS: 334 10*3/uL (ref 150–400)
RBC: 5.68 MIL/uL — ABNORMAL HIGH (ref 3.87–5.11)
RDW: 21.9 % — ABNORMAL HIGH (ref 11.5–15.5)
WBC: 9.6 10*3/uL (ref 4.0–10.5)

## 2016-04-06 LAB — BASIC METABOLIC PANEL
Anion gap: 7 (ref 5–15)
BUN: 13 mg/dL (ref 6–20)
CALCIUM: 9.5 mg/dL (ref 8.9–10.3)
CHLORIDE: 107 mmol/L (ref 101–111)
CO2: 27 mmol/L (ref 22–32)
CREATININE: 0.72 mg/dL (ref 0.44–1.00)
GFR calc Af Amer: >60 mL/min (ref >60)
GFR calc non Af Amer: >60 mL/min (ref >60)
GLUCOSE: 96 mg/dL (ref 65–99)
Potassium: 4 mmol/L (ref 3.5–5.1)
Sodium: 141 mmol/L (ref 135–145)

## 2016-04-06 LAB — PROTIME-INR
INR: 1.07
PROTHROMBIN TIME: 13.9 s (ref 11.4–15.2)

## 2016-04-06 LAB — APTT: aPTT: 31 seconds (ref 24–36)

## 2016-04-06 NOTE — Progress Notes (Signed)
PCP is Dr. Kelton Pillar  Denies ever seeing a cardiologist. Denies any chest pain. Denies ever having a card cath or stress test. Echo noted from 10-11-14. Reports last coumadin was 03-30-16

## 2016-04-06 NOTE — Progress Notes (Signed)
Anesthesia chart review: Patient is a 75 year old female scheduled for left breast lumpectomy with radioactive seed localization on 04/08/16 by Dr. Alphonsa Overall. She is for seed implant on 04/07/16. DX: left breast lesion. (History right breast cancer and RUE melanoma.)  History includes non-smoker, post-operative N/V, hypercholesterolemia, HTN,  fibromyalgia, anemia, GERD, hiatal hernia, diverticulosis, appendectomy, tonsillectomy, liposuction '95, facial cosmetic surgery '09 with history of nose reconstruction, RUE melanoma s/p wide excision, right breast cancer s/p right breast lumpectomy (and s/p radiation therapy 09/24/13-10/17/13) and removal of ruptured right silicone breast implant 07/31/13, removal of left breast implant and bilateral breast augmentation 06/25/14, post-operative RLE DVT and bilateral PE with right heart strain 09/27/14 (presented with syncope) s/p lytic therapy 09/28/14 and IVC filter 10/02/14 (without anticoagulation therapy as she did not tolerate Xarelto due to significant drop in HGB with periorbital ecchymosis) but with readmission for syncope 10/10/14 with IVC filter thrombosis and required thrombolysis and warfarin thearpy, s/p IVC filter removal 02/04/15 (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   - PCP is Dr. Kelton Pillar. - HEM-ONC is Dr. Lindi Adie, last visit 06/11/15 with one year follow-up recommended. She also saw Dr. Beryle Beams in the past, last 11/07/14. (His note states, "Prior to all anticoagulation she had normal pro time and PTT. Normal platelets. I checked her for the presence of a concomitant qualitative platelet disorder. Platelet function studies returned normal as did a von Willebrand's profile. Hypercoagulation profile done during the first admission was unremarkable for any obvious congenital or acquired coagulopathy.Marland KitchenMarland KitchenMarland KitchenGiven her age, clot volume, short interval recurrent thrombotic events, I believe she needs to be on long-term anticoagulation. I do not think there is  any contraindication to using Xarelto or apixiban in the future once she is otherwise stable and after the caval filter has been removed. She could still continue tamoxifen as long she is on full dose anticoagulation although changing to an aromatase inhibitor which is less thrombogenic might be a good alternative.")  - She is not followed by a cardiologist, but was seen by Dr. Acie Fredrickson during her IVC filter thrombosis admission 09/2014.  Meds include Zyrtec, Niferex, warfarin. Her last warfarin dose was 03/30/16.   BP (!) 159/72   Pulse 71   Temp 36.6 C   Resp 20   Ht 5' 5.5" (1.664 m)   Wt 200 lb 8 oz (90.9 kg)   SpO2 100%   BMI 32.86 kg/m   04/06/16 EKG: NSR, low voltage QRS.  10/11/14 Echo (post PE thrombolysis): Study Conclusions - Left ventricle: The cavity size was normal. Wall thickness was   increased in a pattern of mild LVH. Systolic function was normal.   The estimated ejection fraction was in the range of 60% to 65%.   Wall motion was normal; there were no regional wall motion   abnormalities. Doppler parameters are consistent with abnormal   left ventricular relaxation (grade 1 diastolic dysfunction). Impressions: - Since prior echo, McConnell&'s sign is no longer seen.  10/15/14 CT abd/pelvis: IMPRESSION: 1. Findings are worrisome for thrombus within the IVC filter and extending just above the filter. Thrombus in the iliac venous system cannot be excluded. The patient does have bilateral lower extremity DVT diagnosed by Doppler ultrasound. Dedicated venogram can be performed to further delineate. 2. There is a lesion within the junction of the body and head of the pancreas as described. It is low-density. Malignancy is not excluded. MRI of the pancreas is recommended to further delineate. 3. Other findings are stable compared with  yesterday. (I sent Dr. Lindi Adie a staff message with routed report to inquire about plans for pancreatic lesion follow-up.)   10/14/14 CTA  Chest: IMPRESSION: 1. Interval successful near-complete lysis of previously demonstrated bilateral pulmonary emboli. Signs of right heart strain have nearly completely resolved. 2. IVC filter placement with normal opacification of the suprarenal IVC. The infrarenal IVC and pelvic veins are suboptimally opacified. 3. Anasarca with generalized subcutaneous edema in the flanks and thighs and minimal ascites. 4. No evidence of metastatic breast cancer.  04/11/15 BLE Venous Duplex: Summary: - Findings consistent with chronic deep vein thrombosis involving   the common femoral, femoral, popliteal, and posterior tibial   veins of the right lower extremity. - Findings consistent with chronic deep vein thrombosis involving   the femoral and popliteal veins of the left lower extremity. - Bilateral - No evidence of superficial thrombosis involving the   right and an occlusive superficial thrombus of the left lesser   saphenous vein. - No evidence of Baker&'s cyst on the right or left.  Preoperative labs noted. BMET WNL. H/H 11.2/37.6. PT/INR 13.9/1.07.   She is > 1 year out from her PE. Warfarin temporarily on hold for surgery. Anesthesiologist to evaluate on the day of surgery, but if no acute changes then I would anticipate that she could proceed as planned.  George [facility] Short Stay Center/Anesthesiology Phone [phone] 04/06/2016 5:05 PM

## 2016-04-06 NOTE — Pre-Procedure Instructions (Signed)
Ameri Garciaramirez  04/06/2016      [facility] - [place], [place] [address] Phone: [phone] Fax: [place] [address], [place] - [id number] [place] [place] [address] Phone: [phone] Fax: [phone]    Your procedure is scheduled on Oct 19.  Report to [facility] Admitting at 530 A.M.  Call this number if you have problems the morning of surgery:  [phone]   Remember:  Do not eat food or drink liquids after midnight.  Take these medicines the morning of surgery with A SIP OF WATER Tylenol if needed  Stop taking aspirin, BC's, Goody's, Herbal medications, Fish Oil, Ibuprofen, Advil, motrin, aleve   Do not wear jewelry, make-up or nail polish.  Do not wear lotions, powders, or perfumes, or deoderant.  Do not shave 48 hours prior to surgery.  Men may shave face and neck.  Do not bring valuables to the hospital.  [facility] is not responsible for any belongings or valuables.  Contacts, dentures or bridgework may not be worn into surgery.  Leave your suitcase in the car.  After surgery it may be brought to your room.  For patients admitted to the hospital, discharge time will be determined by your treatment team.  Patients discharged the day of surgery will not be allowed to drive home.    Special instructions:  [facility] - Preparing for Surgery  Before surgery, you can play an important role.  Because skin is not sterile, your skin needs to be as free of germs as possible.  You can reduce the number of germs on you skin by washing with CHG (chlorahexidine gluconate) soap before surgery.  CHG is an antiseptic cleaner which kills germs and bonds with the skin to continue killing germs even after washing.  Please DO NOT use if you have an allergy to CHG or antibacterial soaps.  If your skin becomes reddened/irritated stop  using the CHG and inform your nurse when you arrive at Short Stay.  Do not shave (including legs and underarms) for at least 48 hours prior to the first CHG shower.  You may shave your face.  Please follow these instructions carefully:   1.  Shower with CHG Soap the night before surgery and the   morning of Surgery.  2.  If you choose to wash your hair, wash your hair first as usual with your  normal shampoo.  3.  After you shampoo, rinse your hair and body thoroughly to remove the Shampoo.  4.  Use CHG as you would any other liquid soap.  You can apply chg directly to the skin and wash gently with scrungie or a clean washcloth.  5.  Apply the CHG Soap to your body ONLY FROM THE NECK DOWN.  Do not use on open wounds or open sores.  Avoid contact with your eyes,ears, mouth and genitals (private parts).  Wash genitals (private parts)   with your normal soap.  6.  Wash thoroughly, paying special attention to the area where your surgery  will be performed.  7.  Thoroughly rinse your body with warm water from the neck down.  8.  DO NOT shower/wash with your normal soap after using and rinsing off  the CHG Soap.  9.  Pat yourself dry with a clean towel.            10.  Wear clean pajamas.            11.  Place clean sheets on your bed the night of your first shower and do not  sleep with pets.  Day of Surgery  Do not apply any lotions/deoderants the morning of surgery.  Please wear clean clothes to the hospital/surgery center.     Please read over the following fact sheets that you were given. Pain Booklet, Coughing and Deep Breathing and Surgical Site Infection Prevention

## 2016-04-07 NOTE — Anesthesia Preprocedure Evaluation (Addendum)
Anesthesia Evaluation  Patient identified by MRN, date of birth, ID band Patient awake    Reviewed: Allergy & Precautions, NPO status , Patient's Chart, lab work & pertinent test results  History of Anesthesia Complications (+) PONV and history of anesthetic complications  Airway Mallampati: III  TM Distance: >3 FB Neck ROM: Full    Dental  (+) Teeth Intact, Chipped, Dental Advisory Given,    Pulmonary PE (4/16)   Pulmonary exam normal breath sounds clear to auscultation       Cardiovascular hypertension, Normal cardiovascular exam Rhythm:Regular Rate:Normal  Echo: 10/11/14 Echo (post PE thrombolysis): Study Conclusions - Left ventricle: The cavity size was normal. Wall thickness wasincreased in a pattern of mild LVH. Systolic function was normal.The estimated ejection fraction was in the range of 60% to 65%.Wall motion was normal; there were no regional wall motionabnormalities. Doppler parameters are consistent with abnormalleft ventricular relaxation (grade 1 diastolic dysfunction). Impressions: - Since prior echo, McConnell&'s sign is no longer seen   Neuro/Psych PSYCHIATRIC DISORDERS Depression negative neurological ROS     GI/Hepatic Neg liver ROS, hiatal hernia, GERD  ,  Endo/Other  Obesity   Renal/GU negative Renal ROS     Musculoskeletal  (+) Fibromyalgia -  Abdominal   Peds  Hematology  (+) Blood dyscrasia (warfarin), anemia ,   Anesthesia Other Findings Day of surgery medications reviewed with the patient.  Right breast cancer s/p radiation, lumpectomy  Reproductive/Obstetrics                            Anesthesia Physical  Anesthesia Plan  ASA: II  Anesthesia Plan: General   Post-op Pain Management:    Induction: Intravenous  Airway Management Planned: LMA  Additional Equipment:   Intra-op Plan:   Post-operative Plan: Extubation in OR  Informed Consent: I  have reviewed the patients History and Physical, chart, labs and discussed the procedure including the risks, benefits and alternatives for the proposed anesthesia with the patient or authorized representative who has indicated his/her understanding and acceptance.   Dental advisory given  Plan Discussed with: CRNA  Anesthesia Plan Comments: (Risks/benefits of general anesthesia discussed with patient including risk of damage to teeth, lips, gum, and tongue, nausea/vomiting, allergic reactions to medications, and the possibility of heart attack, stroke and death.  All patient questions answered.  Patient wishes to proceed.  TIVA--propofol)       Anesthesia Quick Evaluation

## 2016-04-07 NOTE — H&P (Signed)
Mikael Spray. Loseke  Location: [facility] Patient #: [id number] DOB: 06-23-1940 Married / Language: English / Race: White Female  History of Present Illness   Patient words: breast.   The patient is a 75 year old female who presents with a complaint of lesion of left breast.   The PCP is Dr. Lady Deutscher  The patient was referred by Dr. Alfonso Patten. Marcelo Baldy.  She comes by herself.  She had mammograms at [facility] on 02/24/2016 which showed 0.5 cm architectural distortion at the 11 o'clock position. Though in my discussions with her, she is convinced that there are two lesions in the left breast. I have spoken to Dr. Marcelo Baldy - who is going to review her mammograms. She had a left breast biopsy on 03/01/2016 that showed a complex sclerosing lesion ([id number]). She knows that the area needs to come out.  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 This presumably was caused by the Tamoxifen. She tried xarelto, but had corneal hemorrhage. She is still on coumadin. And that looks to be on permanent. She has had some trouble regulating this and is on 2 mg per day. She has an anemia of unknown etiolgy. She takes Ferex daily and this seems to work.  Past Medical History: 1. Right breast cancer (T1, N0), 2 o'clock Final path - Lobular Ca, 1.2 cm, 0/4 nodes, Grade 2, ER - 100%, PR - 8%, Ki67-10% Right breast lumpectomy, right axillary SLNBx, removal of right breast implant and capsule, excision of lesion of right upper arm - 08/01/2013 Oncology - Dr. Humphrey Rolls and Dr. Isidore Moos Oncotype 11, recurrence risk 7%.  Receiving radiation tx by Dr. Isidore Moos She took Tamoxifen until thrombosis She had bilateral breast reconstructioin by Dr. Iran Planas in Jan 2016.  2. 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 Plan wider excision of melanoma.  She has a 9 mm lesion on her abdomen and she has a mall, 3 mm, irregular lesion of low back, that I will excise.  2. Benign cyst - 0.6 cm - 12 o'clock left breast 3. History of bilateral breast implants. Both implants are submammary and have ruptured capsules. Right implant removed at lumpectomy - 08/01/2013 4. Hypertension 5. Fibromyalgia 6. History of depression 7. Vitamin D deficiency 8. 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 Hiatal hernia - with reflux  Social History:  Married. He has Parkinson's disease. Dr. Erling Cruz was his doctor. 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 She has 2 children    Other Problems (Ammie Eversole, LPN; 3/81/8299 37:16 AM) Breast Cancer Gastroesophageal Reflux Disease General anesthesia - complications Hemorrhoids Hypercholesterolemia Lump In Breast Pulmonary Embolism / Blood Clot in Legs Transfusion history  Past Surgical History (Ammie Eversole, LPN; 9/67/8938 10:17 AM) Appendectomy Breast Augmentation Bilateral. Breast Biopsy Bilateral. Breast Mass; Local Excision Right. Breast Reconstruction Bilateral. Colon Polyp Removal - Colonoscopy Foot Surgery Right. Mammoplasty; Reduction Left. Oral Surgery Sentinel Lymph Node Biopsy Tonsillectomy  Diagnostic Studies History (Ammie Eversole, LPN; 10/29/2583 27:78 AM) Colonoscopy 1-5 years ago Mammogram within last year Pap Smear >5 years ago  Allergies (Ammie Eversole, LPN; 2/42/3536 14:43 AM) No Known Drug Allergies09/27/2017  Medication History (Ammie Eversole, LPN; 1/54/0086 76:19 AM) Tommas Olp 150 (150MG Capsule, Oral) Active. Aller-Time (2.5-60MG Tablet, Oral) Active. Warfarin Sodium (2MG Tablet, Oral) Active. D3 Maximum Strength (  5000UNIT  Capsule, Oral) Active. Medications Reconciled  Social History (Ammie Eversole, LPN; 6/81/1572 62:03 AM) No alcohol use No caffeine use No drug use Tobacco use Never smoker.  Family History Aleatha Borer, LPN; 5/59/7416 38:45 AM) Cerebrovascular Accident Mother. Hypertension Son. Respiratory Condition Father.  Pregnancy / Birth History Aleatha Borer, LPN; 3/64/6803 21:22 AM) Age at menarche 29 years. Age of menopause 57-60 Gravida 2 Maternal age 35-20 Para 2    Review of Systems (Ammie Eversole LPN; 4/82/5003 70:48 AM) General Present- Fatigue. Not Present- Appetite Loss, Chills, Fever, Night Sweats, Weight Gain and Weight Loss. Skin Present- Dryness. Not Present- Change in Wart/Mole, Hives, Jaundice, New Lesions, Non-Healing Wounds, Rash and Ulcer. HEENT Present- Seasonal Allergies and Wears glasses/contact lenses. Not Present- Earache, Hearing Loss, Hoarseness, Nose Bleed, Oral Ulcers, Ringing in the Ears, Sinus Pain, Sore Throat, Visual Disturbances and Yellow Eyes. Cardiovascular Present- Swelling of Extremities. Not Present- Chest Pain, Difficulty Breathing Lying Down, Leg Cramps, Palpitations, Rapid Heart Rate and Shortness of Breath. Gastrointestinal Present- Hemorrhoids and Indigestion. Not Present- Abdominal Pain, Bloating, Bloody Stool, Change in Bowel Habits, Chronic diarrhea, Constipation, Difficulty Swallowing, Excessive gas, Gets full quickly at meals, Nausea, Rectal Pain and Vomiting. Musculoskeletal Present- Muscle Pain, Muscle Weakness and Swelling of Extremities. Not Present- Back Pain, Joint Pain and Joint Stiffness. Neurological Present- Trouble walking and Weakness. Not Present- Decreased Memory, Fainting, Headaches, Numbness, Seizures, Tingling and Tremor. Hematology Present- Blood Thinners and Easy Bruising. Not Present- Excessive bleeding, Gland problems, HIV and Persistent Infections.  Vitals (Ammie Eversole LPN; 8/89/1694 50:38 AM) 03/17/2016  10:22 AM Weight: 200.4 lb Height: 65.5in Body Surface Area: 1.99 m Body Mass Index: 32.84 kg/m  Temp.: 98.29F(Oral)  Pulse: 88 (Regular)  BP: 128/72 (Sitting, Left Arm, Standard)  Physical Exam  General: Older moderately obese WF alert and generally healthy appearing. Skin: Inspection and palpation of the skin unremarkable.  Eyes: Conjunctivae white, pupils equal. Face, ears, nose, mouth, and throat: Face - normal. Normal ears and nose. Lips and teeth normal.  Neck: Supple. No mass. Trachea midline. No thyroid mass. Lymph Nodes: No supraclavicular or cervical adenopathy.  No axillary adenopathy.  Lungs: Normal respiratory effort. Clear to auscultation and symmetric breath sounds. Cardiovascular: Regular rate and rythm. Normal auscultation of the heart. No murmur or rub.  Normal carotid pulse.  Breasts: Right:  No mass  Left - bruise at 12 o'clock. No palpable mass. She has a reduction mammoplasty.  Abdomen: Soft. No mass. Liver and spleen not palpable. Normal bowel sounds.  Musculoskeletal/extremities: Normal gait.  Good strength and ROM in upper and lower extremities.  She still has some swelling in the LE.  Neurologic: Grossly intact to motor and sensory function.  Psychiatric: Has normal mood and affect. Judgement and insight appear normal.   Assessment & Plan  1.  LEFT BREAST MASS (N63)  Story: Left breast biopsy on 03/01/2016 that showed a complex sclerosing lesion ([id number]) Impression: Plan:  1) Left breast lumpectomy with seed localization   She had a question whether there were one lesion or two. I spoke to Dr. Marcelo Baldy. She has some distortion and a calcification, but these are side by side and will come out together.  2.  HISTORY OF RIGHT BREAST CANCER (Z85.3)  Right breast cancer (T1, N0), 2 o'clock Final path - Lobular Ca, 1.2 cm, 0/4 nodes, Grade 2, ER - 100%, PR - 8%, Ki67-10% Right breast lumpectomy, right axillary  SLNBx, removal of right breast implant and capsule, excision of lesion of right upper  arm - 08/01/2013 Oncology - Dr. Humphrey Rolls and Dr. Isidore Moos Oncotype 11, recurrence risk 7%.   Radiation tx by Dr. Isidore Moos  She took Tamoxifen until thrombosis  She had bilateral breast reconstructioin by Dr. Iran Planas in Jan 2016. 3.  ANTICOAGULATED ON COUMADIN (Z51.81) 4. History of thrombosis of distal IVC filter  IVC filer removed Aug 2016  Hospitalized 4/21 - 10/26/2014 -  On coumadin  5. 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 6. History of bilateral breast implants. Both implants are submammary and have ruptured capsules. Right implant removed at lumpectomy - 08/01/2013 7. Hypertension 8. Fibromyalgia 9. History of depression 10. Vitamin D deficiency 11. Hiatal hernia - with reflux   Alphonsa Overall, MD, [facility] Pager: [phone] Office phone:  [phone]

## 2016-04-08 ENCOUNTER — Encounter (HOSPITAL_COMMUNITY): Payer: Self-pay | Admitting: *Deleted

## 2016-04-08 ENCOUNTER — Other Ambulatory Visit: Payer: Self-pay

## 2016-04-08 ENCOUNTER — Encounter (HOSPITAL_COMMUNITY)
Admission: RE | Disposition: A | Discharge status: Home / Self Care | Payer: Self-pay | Source: Ambulatory Visit | Attending: Surgery

## 2016-04-08 ENCOUNTER — Ambulatory Visit (HOSPITAL_COMMUNITY): Payer: Medicare Other | Admitting: Vascular Surgery

## 2016-04-08 ENCOUNTER — Ambulatory Visit (HOSPITAL_COMMUNITY)
Admission: RE | Admit: 2016-04-08 | Discharge: 2016-04-08 | Disposition: A | Discharge status: Home / Self Care | Payer: Medicare Other | Source: Ambulatory Visit | Attending: Surgery | Admitting: Surgery

## 2016-04-08 ENCOUNTER — Ambulatory Visit (HOSPITAL_COMMUNITY): Payer: Medicare Other | Admitting: Anesthesiology

## 2016-04-08 DIAGNOSIS — Z7901 Long term (current) use of anticoagulants: Secondary | ICD-10-CM | POA: Insufficient documentation

## 2016-04-08 DIAGNOSIS — I10 Essential (primary) hypertension: Secondary | ICD-10-CM | POA: Insufficient documentation

## 2016-04-08 DIAGNOSIS — Z86010 Personal history of colonic polyps: Secondary | ICD-10-CM | POA: Insufficient documentation

## 2016-04-08 DIAGNOSIS — K219 Gastro-esophageal reflux disease without esophagitis: Secondary | ICD-10-CM | POA: Insufficient documentation

## 2016-04-08 DIAGNOSIS — Z85820 Personal history of malignant melanoma of skin: Secondary | ICD-10-CM | POA: Diagnosis not present

## 2016-04-08 DIAGNOSIS — Z8249 Family history of ischemic heart disease and other diseases of the circulatory system: Secondary | ICD-10-CM | POA: Insufficient documentation

## 2016-04-08 DIAGNOSIS — Z853 Personal history of malignant neoplasm of breast: Secondary | ICD-10-CM | POA: Diagnosis not present

## 2016-04-08 DIAGNOSIS — K449 Diaphragmatic hernia without obstruction or gangrene: Secondary | ICD-10-CM | POA: Insufficient documentation

## 2016-04-08 DIAGNOSIS — Z6832 Body mass index (BMI) 32.0-32.9, adult: Secondary | ICD-10-CM | POA: Insufficient documentation

## 2016-04-08 DIAGNOSIS — F329 Major depressive disorder, single episode, unspecified: Secondary | ICD-10-CM | POA: Insufficient documentation

## 2016-04-08 DIAGNOSIS — Z86718 Personal history of other venous thrombosis and embolism: Secondary | ICD-10-CM | POA: Diagnosis not present

## 2016-04-08 DIAGNOSIS — C50511 Malignant neoplasm of lower-outer quadrant of right female breast: Secondary | ICD-10-CM

## 2016-04-08 DIAGNOSIS — L905 Scar conditions and fibrosis of skin: Secondary | ICD-10-CM | POA: Insufficient documentation

## 2016-04-08 DIAGNOSIS — K869 Disease of pancreas, unspecified: Secondary | ICD-10-CM

## 2016-04-08 DIAGNOSIS — Z79899 Other long term (current) drug therapy: Secondary | ICD-10-CM | POA: Insufficient documentation

## 2016-04-08 DIAGNOSIS — M797 Fibromyalgia: Secondary | ICD-10-CM | POA: Insufficient documentation

## 2016-04-08 DIAGNOSIS — D649 Anemia, unspecified: Secondary | ICD-10-CM | POA: Insufficient documentation

## 2016-04-08 DIAGNOSIS — E559 Vitamin D deficiency, unspecified: Secondary | ICD-10-CM | POA: Insufficient documentation

## 2016-04-08 DIAGNOSIS — E7800 Pure hypercholesterolemia, unspecified: Secondary | ICD-10-CM | POA: Insufficient documentation

## 2016-04-08 DIAGNOSIS — G20 Parkinson's disease: Secondary | ICD-10-CM | POA: Insufficient documentation

## 2016-04-08 DIAGNOSIS — Z836 Family history of other diseases of the respiratory system: Secondary | ICD-10-CM | POA: Insufficient documentation

## 2016-04-08 DIAGNOSIS — E669 Obesity, unspecified: Secondary | ICD-10-CM | POA: Insufficient documentation

## 2016-04-08 DIAGNOSIS — Z823 Family history of stroke: Secondary | ICD-10-CM | POA: Insufficient documentation

## 2016-04-08 DIAGNOSIS — Z9882 Breast implant status: Secondary | ICD-10-CM | POA: Insufficient documentation

## 2016-04-08 HISTORY — PX: BREAST LUMPECTOMY WITH RADIOACTIVE SEED LOCALIZATION: SHX6424

## 2016-04-08 SURGERY — BREAST LUMPECTOMY WITH RADIOACTIVE SEED LOCALIZATION
Anesthesia: General | Site: Breast | Laterality: Left

## 2016-04-08 MED ORDER — 0.9 % SODIUM CHLORIDE (POUR BTL) OPTIME
TOPICAL | Status: DC | PRN
  Administered 2016-04-08: 1000 mL

## 2016-04-08 MED ORDER — LIDOCAINE 2% (20 MG/ML) 5 ML SYRINGE
INTRAMUSCULAR | Status: DC | PRN
  Administered 2016-04-08: 100 mg via INTRAVENOUS

## 2016-04-08 MED ORDER — DIPHENHYDRAMINE HCL 50 MG/ML IJ SOLN
INTRAMUSCULAR | Status: DC | PRN
  Administered 2016-04-08: 12.5 mg via INTRAVENOUS

## 2016-04-08 MED ORDER — DEXAMETHASONE SODIUM PHOSPHATE 10 MG/ML IJ SOLN
INTRAMUSCULAR | Status: AC
  Filled 2016-04-08: qty 1

## 2016-04-08 MED ORDER — LACTATED RINGERS IV SOLN
INTRAVENOUS | Status: DC | PRN
  Administered 2016-04-08 (×2): via INTRAVENOUS

## 2016-04-08 MED ORDER — ONDANSETRON HCL 4 MG/2ML IJ SOLN
INTRAMUSCULAR | Status: DC | PRN
  Administered 2016-04-08: 4 mg via INTRAVENOUS

## 2016-04-08 MED ORDER — PHENYLEPHRINE HCL 10 MG/ML IJ SOLN
INTRAMUSCULAR | Status: DC | PRN
  Administered 2016-04-08: 80 ug via INTRAVENOUS
  Administered 2016-04-08: 60 ug via INTRAVENOUS

## 2016-04-08 MED ORDER — DIPHENHYDRAMINE HCL 50 MG/ML IJ SOLN
INTRAMUSCULAR | Status: AC
  Filled 2016-04-08: qty 1

## 2016-04-08 MED ORDER — PROPOFOL 10 MG/ML IV BOLUS
INTRAVENOUS | Status: DC | PRN
  Administered 2016-04-08: 150 mg via INTRAVENOUS

## 2016-04-08 MED ORDER — GABAPENTIN 300 MG PO CAPS: 300.0000 mg | ORAL_CAPSULE | ORAL | Status: DC

## 2016-04-08 MED ORDER — HYDROCODONE-ACETAMINOPHEN 5-325 MG PO TABS
1.0000 | ORAL_TABLET | Freq: Four times a day (QID) | ORAL | 0 refills | Status: DC | PRN
Start: 2016-04-08 — End: 2016-10-05

## 2016-04-08 MED ORDER — BUPIVACAINE-EPINEPHRINE 0.25% -1:200000 IJ SOLN
INTRAMUSCULAR | Status: AC
  Filled 2016-04-08: qty 1

## 2016-04-08 MED ORDER — LIDOCAINE 2% (20 MG/ML) 5 ML SYRINGE
INTRAMUSCULAR | Status: AC
  Filled 2016-04-08: qty 5

## 2016-04-08 MED ORDER — BUPIVACAINE-EPINEPHRINE 0.25% -1:200000 IJ SOLN
INTRAMUSCULAR | Status: DC | PRN
  Administered 2016-04-08: 30 mL

## 2016-04-08 MED ORDER — DEXAMETHASONE SODIUM PHOSPHATE 10 MG/ML IJ SOLN
INTRAMUSCULAR | Status: DC | PRN
  Administered 2016-04-08: 10 mg via INTRAVENOUS

## 2016-04-08 MED ORDER — ACETAMINOPHEN 500 MG PO TABS: 1000.0000 mg | ORAL_TABLET | ORAL | Status: DC

## 2016-04-08 MED ORDER — MIDAZOLAM HCL 2 MG/2ML IJ SOLN
INTRAMUSCULAR | Status: AC
  Filled 2016-04-08: qty 2

## 2016-04-08 MED ORDER — FENTANYL CITRATE (PF) 100 MCG/2ML IJ SOLN
INTRAMUSCULAR | Status: DC | PRN
  Administered 2016-04-08 (×2): 50 ug via INTRAVENOUS

## 2016-04-08 MED ORDER — PROPOFOL 500 MG/50ML IV EMUL
INTRAVENOUS | Status: DC | PRN
  Administered 2016-04-08: 100 ug/kg/min via INTRAVENOUS

## 2016-04-08 MED ORDER — PROPOFOL 10 MG/ML IV BOLUS
INTRAVENOUS | Status: AC
  Filled 2016-04-08: qty 20

## 2016-04-08 MED ORDER — CHLORHEXIDINE GLUCONATE CLOTH 2 % EX PADS: 6.0000 | MEDICATED_PAD | Freq: Once | CUTANEOUS | Status: DC

## 2016-04-08 MED ORDER — FENTANYL CITRATE (PF) 100 MCG/2ML IJ SOLN: 25.0000 ug | INTRAMUSCULAR | Status: DC | PRN

## 2016-04-08 MED ORDER — FENTANYL CITRATE (PF) 100 MCG/2ML IJ SOLN
INTRAMUSCULAR | Status: AC
  Filled 2016-04-08: qty 2

## 2016-04-08 MED ORDER — CEFAZOLIN SODIUM-DEXTROSE 2-4 GM/100ML-% IV SOLN
2.0000 g | INTRAVENOUS | Status: AC
  Administered 2016-04-08: 2 g via INTRAVENOUS

## 2016-04-08 MED ORDER — ONDANSETRON HCL 4 MG/2ML IJ SOLN: 4.0000 mg | Freq: Once | INTRAMUSCULAR | Status: DC | PRN

## 2016-04-08 SURGICAL SUPPLY — 45 items
ADH SKN CLS APL DERMABOND .7 (GAUZE/BANDAGES/DRESSINGS) ×1
APPLIER CLIP 9.375 MED OPEN (MISCELLANEOUS)
APR CLP MED 9.3 20 MLT OPN (MISCELLANEOUS)
BINDER BREAST LRG (GAUZE/BANDAGES/DRESSINGS) ×2 IMPLANT
BINDER BREAST XLRG (GAUZE/BANDAGES/DRESSINGS) IMPLANT
BLADE SURG 15 STRL LF DISP TIS (BLADE) ×1 IMPLANT
BLADE SURG 15 STRL SS (BLADE) ×3
CANISTER SUCTION 2500CC (MISCELLANEOUS) ×3 IMPLANT
CHLORAPREP W/TINT 26ML (MISCELLANEOUS) ×3 IMPLANT
CLIP APPLIE 9.375 MED OPEN (MISCELLANEOUS) IMPLANT
CLIP TI WIDE RED SMALL 6 (CLIP) ×3 IMPLANT
COVER PROBE W GEL 5X96 (DRAPES) ×3 IMPLANT
COVER SURGICAL LIGHT HANDLE (MISCELLANEOUS) ×3 IMPLANT
DERMABOND ADVANCED (GAUZE/BANDAGES/DRESSINGS) ×2
DERMABOND ADVANCED .7 DNX12 (GAUZE/BANDAGES/DRESSINGS) ×1 IMPLANT
DEVICE DUBIN SPECIMEN MAMMOGRA (MISCELLANEOUS) ×3 IMPLANT
DRAPE CHEST BREAST 15X10 FENES (DRAPES) ×3 IMPLANT
DRAPE UTILITY XL STRL (DRAPES) ×3 IMPLANT
ELECT COATED BLADE 2.86 ST (ELECTRODE) ×3 IMPLANT
ELECT REM PT RETURN 9FT ADLT (ELECTROSURGICAL) ×3
ELECTRODE REM PT RTRN 9FT ADLT (ELECTROSURGICAL) ×1 IMPLANT
GAUZE SPONGE 4X4 12PLY STRL (GAUZE/BANDAGES/DRESSINGS) ×3 IMPLANT
GLOVE SURG SIGNA 7.5 PF LTX (GLOVE) ×3 IMPLANT
GOWN STRL REUS W/ TWL LRG LVL3 (GOWN DISPOSABLE) ×1 IMPLANT
GOWN STRL REUS W/ TWL XL LVL3 (GOWN DISPOSABLE) ×1 IMPLANT
GOWN STRL REUS W/TWL LRG LVL3 (GOWN DISPOSABLE) ×3
GOWN STRL REUS W/TWL XL LVL3 (GOWN DISPOSABLE) ×3
KIT BASIN OR (CUSTOM PROCEDURE TRAY) ×3 IMPLANT
KIT MARKER MARGIN INK (KITS) ×3 IMPLANT
NDL HYPO 25GX1X1/2 BEV (NEEDLE) ×1 IMPLANT
NEEDLE HYPO 25GX1X1/2 BEV (NEEDLE) ×3 IMPLANT
NS IRRIG 1000ML POUR BTL (IV SOLUTION) IMPLANT
PACK SURGICAL SETUP 50X90 (CUSTOM PROCEDURE TRAY) ×3 IMPLANT
PENCIL BUTTON HOLSTER BLD 10FT (ELECTRODE) ×3 IMPLANT
SPONGE GAUZE 4X4 12PLY STER LF (GAUZE/BANDAGES/DRESSINGS) ×2 IMPLANT
SPONGE LAP 18X18 X RAY DECT (DISPOSABLE) ×3 IMPLANT
SUT MNCRL AB 4-0 PS2 18 (SUTURE) ×3 IMPLANT
SUT VIC AB 3-0 SH 8-18 (SUTURE) ×3 IMPLANT
SYR BULB 3OZ (MISCELLANEOUS) ×3 IMPLANT
SYR CONTROL 10ML LL (SYRINGE) ×3 IMPLANT
TOWEL OR 17X24 6PK STRL BLUE (TOWEL DISPOSABLE) ×3 IMPLANT
TOWEL OR 17X26 10 PK STRL BLUE (TOWEL DISPOSABLE) ×3 IMPLANT
TUBE CONNECTING 12'X1/4 (SUCTIONS) ×1
TUBE CONNECTING 12X1/4 (SUCTIONS) ×2 IMPLANT
YANKAUER SUCT BULB TIP NO VENT (SUCTIONS) ×3 IMPLANT

## 2016-04-08 NOTE — Anesthesia Postprocedure Evaluation (Signed)
Anesthesia Post Note  Patient: [name]  Procedure(s) Performed: Procedure(s) (LRB): LEFT BREAST LUMPECTOMY WITH RADIOACTIVE SEED LOCALIZATION (Left)  Patient location during evaluation: PACU Anesthesia Type: General Level of consciousness: awake and alert Pain management: pain level controlled Vital Signs Assessment: post-procedure vital signs reviewed and stable Respiratory status: spontaneous breathing, nonlabored ventilation, respiratory function stable and patient connected to nasal cannula oxygen Cardiovascular status: blood pressure returned to baseline and stable Postop Assessment: no signs of nausea or vomiting Anesthetic complications: no    Last Vitals:  Vitals:   04/08/16 0930 04/08/16 0945  BP: (!) 126/57 138/78  Pulse: 80 79  Resp: 13 15  Temp: 36.3 C     Last Pain:  Vitals:   04/08/16 0930  PainSc: 0-No pain                 Catalina Gravel

## 2016-04-08 NOTE — Transfer of Care (Signed)
Immediate Anesthesia Transfer of Care Note  Patient: [name]  Procedure(s) Performed: Procedure(s): LEFT BREAST LUMPECTOMY WITH RADIOACTIVE SEED LOCALIZATION (Left)  Patient Location: PACU  Anesthesia Type:General  Level of Consciousness: awake, alert  and oriented  Airway & Oxygen Therapy: Patient Spontanous Breathing and Patient connected to face mask oxygen  Post-op Assessment: Report given to RN and Post -op Vital signs reviewed and stable  Post vital signs: Reviewed and stable  Last Vitals:  Vitals:   04/08/16 0609  BP: (!) 167/66  Pulse: 69  Resp: 20  Temp: 36.6 C    Last Pain: There were no vitals filed for this visit.       Complications: No apparent anesthesia complications

## 2016-04-08 NOTE — Op Note (Signed)
04/08/2016  8:33 AM  PATIENT:  [name] DOB: 1941/02/05 MRN: [id number]  PREOP DIAGNOSIS:  LEFT BREAST LESION  POSTOP DIAGNOSIS:   Left breast lesion, 11 o'clock position   PROCEDURE:   Procedure(s):  LEFT BREAST LUMPECTOMY WITH RADIOACTIVE SEED LOCALIZATION  SURGEON:   Alphonsa Overall, M.D.  ANESTHESIA:   general  Anesthesiologist: Catalina Gravel, MD CRNA: Babs Bertin, CRNA; Inda Coke, CRNA  General  EBL:  minimal  ml  DRAINS: none   LOCAL MEDICATIONS USED:   30 cc 1/4% marcaine  SPECIMEN:   Left breast specimen (6 color paint)  COUNTS CORRECT:  YES  INDICATIONS FOR PROCEDURE:  [name] is a 75 y.o. (DOB: 1940/08/04) white female whose primary care physician is Osborne Casco, MD and comes for left breast lumpectomy.   Ms. Vardaman had a biopsy of an area of architectural distortion at [facility] on 02/24/2016 which showed a complex sclerosing lesion.  She now comes for open biopsy of that area.   Note she has a history of a T1, N0 right breast cancer treated with right lumpectomy in 08/01/2013.  Drs. Mardene Sayer were her treating oncologist.    The indications and potential complications of surgery were explained to the patient. Potential complications include, but are not limited to, bleeding, infection, the need for further surgery, and nerve injury.     She had a I131 seed placed  in her left breast at [facility].  I confirmed the presence of the I131 seed in the pre op area using the Neoprobe.  The seed is in the 11 o'clock position of the left breast.     OPERATIVE NOTE:   The patient was taken to room # 1 at [place] [facility] where she underwent a general anesthesia  supervised by Anesthesiologist: Catalina Gravel, MD CRNA: Babs Bertin, CRNA; Inda Coke, CRNA. Her left breast and axilla were prepped with  ChloraPrep and sterilely draped.    A time-out and the surgical check list was reviewed.    I turned attention to the lesion which was  about at the 11 o'clock position of the left breast.   I used the Neoprobe to identify the I131 seed.  I tried to excise an area around the tumor of at least 1 cm.    I excised this block of breast tissue approximately 3 cm by 4 cm  in diameter.  I took the excision down to her pectoralis muscle which was over her implant.  I painted the lumpectomy specimen with the 6 color paint kit and did a specimen mammogram which confirmed the mass, clip, and the seed were all in the right position in the specimen.  The specimen was sent to pathology who called back to confirm that they have the seed and the specimen.   I then irrigated the wound with saline. I infiltrated approximately 30 mL of 1/4% Marcaine between the incisions.  I then closed all the wounds in layers using 3-0 Vicryl sutures for the deep layer. At the skin, I closed the incisions with a 4-0 Monocryl suture. The incisions were then painted with LiquiBand.  She had gauze place over the wounds and placed in a breast binder.   The patient tolerated the procedure well, was transported to the recovery room in good condition. Sponge and needle count were correct at the end of the case.   Final pathology is pending.   Alphonsa Overall, MD, [facility] Pager: [phone] Office  phone:  [phone]

## 2016-04-08 NOTE — Interval H&P Note (Signed)
History and Physical Interval Note:  04/08/2016 7:48 AM  [name]  has presented today for surgery, with the diagnosis of LEFT BREAST LESION  The various methods of treatment have been discussed with the patient and family.   Seed in place and husband at bedside.  After consideration of risks, benefits and other options for treatment, the patient has consented to  Procedure(s): LEFT BREAST LUMPECTOMY WITH RADIOACTIVE SEED LOCALIZATION (Left) as a surgical intervention .  The patient's history has been reviewed, patient examined, no change in status, stable for surgery.  I have reviewed the patient's chart and labs.  Questions were answered to the patient's satisfaction.     Anthon Harpole H

## 2016-04-08 NOTE — Anesthesia Procedure Notes (Signed)
Procedure Name: LMA Insertion Date/Time: 04/08/2016 7:37 AM Performed by: Manuela Schwartz B Pre-anesthesia Checklist: Patient identified, Emergency Drugs available, Suction available, Patient being monitored and Timeout performed Patient Re-evaluated:Patient Re-evaluated prior to inductionOxygen Delivery Method: Circle system utilized Preoxygenation: Pre-oxygenation with 100% oxygen Intubation Type: IV induction LMA: LMA inserted LMA Size: 4.0 Number of attempts: 1 Tube secured with: Tape Dental Injury: Teeth and Oropharynx as per pre-operative assessment

## 2016-04-08 NOTE — Discharge Instructions (Signed)
[facility] - DISCHARGE INSTRUCTIONS TO PATIENT  Activity:  Driving - May drive in one or two days, if doings well   Lifting - No lifting more than 15 pounds for 5 days, then no limit  Wound Care:   Leave the incision dry for 2 days, then may shower  Diet:  As tolerated  Follow up appointment:  Call Dr. Pollie Friar office [facility]) at [phone] for an appointment in 2 to 3 weeks.  Medications and dosages:  Resume your home medications.  You have a prescription for:  Vicodin       May resume Coumadin in 2 days, if doing well  Call Dr. Lucia Gaskins or his office  [phone]) if you have:  Temperature greater than 100.4,  Persistent nausea and vomiting,  Severe uncontrolled pain,  Redness, tenderness, or signs of infection (pain, swelling, redness, odor or green/yellow discharge around the site),  Difficulty breathing, headache or visual disturbances,  Any other questions or concerns you may have after discharge.  In an emergency, call 911 or go to an Emergency Department at a nearby hospital.

## 2016-04-09 ENCOUNTER — Encounter (HOSPITAL_COMMUNITY): Payer: Self-pay | Admitting: Surgery

## 2016-05-07 ENCOUNTER — Telehealth: Payer: Self-pay

## 2016-05-07 NOTE — Telephone Encounter (Signed)
Upon chart review, it was noted that MRI Abdomen was never scheduled for further workup of pancreatic lesion brought to Dr. Geralyn Flash attention from [place] scan dated 10/15/14.  Called pt to schedule but upon asking preference on scheduling, pt became frustrated as she does not understand why MRI is being scheduled based on scan from last year.  I explained to pt that this was brought to Dr. Geralyn Flash attention by another physician and Dr. Lindi Adie ordered MRI that day for further work up. MRI to be obtained within 1 week based on order.  I explained to pt I do not know the serious of events leading up to this scan obtained during a hospitalization last year but all I had knowledge of was the series of events once our office became informed of lesion.  I reviewed scenario with Dr. Lindi Adie who wishes to speak to pt one-on-one to better explain and discuss questions and concerns she may have.  Dr. Lindi Adie requesting to see pt next week; however, when I called pt back to schedule, she states she is unable to come in until the first week in December.  I asked her preference and she wishes to come in on December 5 at 2:15.  Scheduling message sent to obtain this appointment for pt and Dr. Lindi Adie notified.  Encouraged pt to call with any further questions or concerns should they arise.  Pt verbalized understanding and is without additional questions or concerns at this time.

## 2016-05-24 NOTE — Assessment & Plan Note (Signed)
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   Tamoxifen toxicities: 1. 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 Leg aches and pains related to prior thrombosis: Continue with anticoagulation We will need to refer her to Coumadin clinic for active management of anticoagulation with Coumadin.  I discussed with her about switching from tamoxifen to anastrozole 1 mg daily to complete the rest of the 5 years. But patient already discontinued tamoxifen therapy and is not interested in pursuing any further antiestrogen treatment.  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 Current treatment: Coumadin therapy, INR 1.7, patient alternates 2 mg with 1 mg dose. I renewed her prescription today. She gets monthly blood count checks.  Return to clinic in 1 year for follow-up

## 2016-05-25 ENCOUNTER — Ambulatory Visit: Payer: Medicare Other

## 2016-05-25 ENCOUNTER — Encounter: Payer: Self-pay | Admitting: Hematology and Oncology

## 2016-05-25 ENCOUNTER — Other Ambulatory Visit (HOSPITAL_COMMUNITY)
Admission: AD | Admit: 2016-05-25 | Discharge: 2016-05-25 | Disposition: A | Discharge status: Home / Self Care | Payer: Medicare Other | Source: Ambulatory Visit | Attending: Hematology and Oncology | Admitting: Hematology and Oncology

## 2016-05-25 ENCOUNTER — Ambulatory Visit (HOSPITAL_BASED_OUTPATIENT_CLINIC_OR_DEPARTMENT_OTHER): Payer: Medicare Other | Admitting: Hematology and Oncology

## 2016-05-25 ENCOUNTER — Other Ambulatory Visit: Payer: Self-pay

## 2016-05-25 DIAGNOSIS — C50511 Malignant neoplasm of lower-outer quadrant of right female breast: Secondary | ICD-10-CM

## 2016-05-25 DIAGNOSIS — Z853 Personal history of malignant neoplasm of breast: Secondary | ICD-10-CM

## 2016-05-25 DIAGNOSIS — Z86718 Personal history of other venous thrombosis and embolism: Secondary | ICD-10-CM

## 2016-05-25 DIAGNOSIS — Z170 Estrogen receptor positive status [ER+]: Secondary | ICD-10-CM | POA: Insufficient documentation

## 2016-05-25 DIAGNOSIS — Z85820 Personal history of malignant melanoma of skin: Secondary | ICD-10-CM

## 2016-05-25 DIAGNOSIS — Z7901 Long term (current) use of anticoagulants: Secondary | ICD-10-CM

## 2016-05-25 LAB — PROTIME-INR
INR: 3.77
Prothrombin Time: 38.1 seconds — ABNORMAL HIGH (ref 11.4–15.2)

## 2016-05-25 MED ORDER — WARFARIN SODIUM 2 MG PO TABS: 2.0000 mg | ORAL_TABLET | Freq: Every day | ORAL | 3 refills | Status: DC

## 2016-05-25 NOTE — Progress Notes (Addendum)
Patient Care Team: Kelton Pillar, MD as PCP - General (Family Medicine) Christene Slates, MD as Physician Assistant (Radiology) Brien Few, MD as Consulting Physician (Obstetrics and Gynecology) Lafayette Dragon, MD (Inactive) as Consulting Physician (Gastroenterology) Marcy Panning, MD as Consulting Physician (Oncology) Alphonsa Overall, MD as Consulting Physician (General Surgery) Eppie Gibson, MD as Consulting Physician (Radiation Oncology) Alphonsa Overall, MD as Consulting Physician (General Surgery) Annia Belt, MD as Consulting Physician (Oncology) Nicholas Lose, MD as Consulting Physician (Hematology and Oncology) Irene Limbo, MD as Consulting Physician (Plastic Surgery)  DIAGNOSIS:  Encounter Diagnosis  Name Primary?  . Malignant neoplasm of lower-outer quadrant of right breast of female, estrogen receptor positive ([place])     SUMMARY OF ONCOLOGIC HISTORY:   Breast cancer of lower-outer quadrant of right female breast ([place])   07/31/2013 Surgery    Right lumpectomy: Invasive lobular cancer, LVID present, BMI present, margins negative, right arm invasive malignant melanoma, 0/4 lymph nodes negative, grade 2, 1.8 cm, T1c N0 stage IA ER 100%, PR 8%, HER-2 negative ratio 1, Ki-67 10%      09/24/2013 - 10/17/2013 Radiation Therapy    Adjuvant radiation therapy 42.56 Gy in 16 fractions      12/03/2013 - 09/24/2014 Anti-estrogen oral therapy    Adjuvant tamoxifen 20 mg daily 5 years      10/10/2014 - 10/26/2014 Hospital Admission    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       CHIEF COMPLIANT: Follow-up on anticoagulation  INTERVAL HISTORY: Taelor Waymire is a 23 year with above-mentioned history of right breast cancer treated with lumpectomy adjuvant radiation. She took antiestrogen therapy for about a year and tamoxifen was discontinued because of thrombosis. patient was offered antiestrogen therapy  with anastrozole. But she was not interested in this. She is currently on surveillance and observation. She is doing much better from the blood clot standpoint. She continues to take Coumadin. IVC filter was removed in August 2016. Patient underwent thrombectomy and IVC filter placement in April 2016. CT scans done at that hospitalization showed a small nodule in the pancreas as well as an abnormality on the kidney. I was informed of these abnormalities through her primary care physician and I requested a MRI of the abdomen. Patient was furious that these findings were not informed to her at the time of the scan and refused to undergo the MRI of the abdomen. She felt that if this was anything serious, she would have had more symptoms by now. She feels that if we think that she needs to do the scan, our hospital should take financial responsibility for this scan. Recently she was noted to have an abnormality in the right breast and underwent surgery on 04/08/2016 which showed radial scar and fibrocystic changes and no malignancy.  REVIEW OF SYSTEMS:   Constitutional: Denies fevers, chills or abnormal weight loss Eyes: Denies blurriness of vision Ears, nose, mouth, throat, and face: Denies mucositis or sore throat Respiratory: Denies cough, dyspnea or wheezes Cardiovascular: Denies palpitation, chest discomfort Gastrointestinal:  Denies nausea, heartburn or change in bowel habits Skin: Denies abnormal skin rashes Lymphatics: Denies new lymphadenopathy or easy bruising Neurological:Denies numbness, tingling or new weaknesses Behavioral/Psych: Mood is stable, no new changes  Extremities: No lower extremity edema Breast:  denies any pain or lumps or nodules in either breasts All other systems were reviewed with the patient and are negative.  I have reviewed the past medical history, past surgical history,  social history and family history with the patient and they are unchanged from previous  note.  ALLERGIES:  is allergic to no known allergies.  MEDICATIONS:  Current Outpatient Prescriptions  Medication Sig Dispense Refill  . acetaminophen (TYLENOL) 325 MG tablet Take 325-650 mg by mouth every 6 (six) hours as needed (for pain.).    Marland Kitchen cetirizine (ZYRTEC) 10 MG tablet Take 10 mg by mouth at bedtime. Allertec    . Cholecalciferol (VITAMIN D3) 5000 UNITS TABS Take 5,000 Units by mouth at bedtime.     Marland Kitchen HYDROcodone-acetaminophen (NORCO/VICODIN) 5-325 MG tablet Take 1-2 tablets by mouth every 6 (six) hours as needed for moderate pain. 30 tablet 0  . iron polysaccharides (NIFEREX) 150 MG capsule Take 150 mg by mouth at bedtime. 9pm    . Probiotic Product (PROBIOTIC PO) Take 1 tablet by mouth at bedtime.    Marland Kitchen warfarin (COUMADIN) 2 MG tablet Take 1 tablet (2 mg total) by mouth daily. 90 tablet 3   No current facility-administered medications for this visit.     PHYSICAL EXAMINATION: ECOG PERFORMANCE STATUS: 1 - Symptomatic but completely ambulatory  Vitals:   05/25/16 1403  BP: (!) 160/75  Pulse: 96  Resp: 19  Temp: 97.9 F (36.6 C)   Filed Weights   05/25/16 1403  Weight: 196 lb 12.8 oz (89.3 kg)    GENERAL:alert, no distress and comfortable SKIN: skin color, texture, turgor are normal, no rashes or significant lesions EYES: normal, Conjunctiva are pink and non-injected, sclera clear OROPHARYNX:no exudate, no erythema and lips, buccal mucosa, and tongue normal  NECK: supple, thyroid normal size, non-tender, without nodularity LYMPH:  no palpable lymphadenopathy in the cervical, axillary or inguinal LUNGS: clear to auscultation and percussion with normal breathing effort HEART: regular rate & rhythm and no murmurs and no lower extremity edema ABDOMEN:abdomen soft, non-tender and normal bowel sounds MUSCULOSKELETAL:no cyanosis of digits and no clubbing  NEURO: alert & oriented x 3 with fluent speech, no focal motor/sensory deficits EXTREMITIES: No lower extremity  edema BREAST: No palpable masses or nodules in either right or left breasts. No palpable axillary supraclavicular or infraclavicular adenopathy no breast tenderness or nipple discharge. (exam performed in the presence of a chaperone)  LABORATORY DATA:  I have reviewed the data as listed   Chemistry      Component Value Date/Time   NA 141 04/06/2016 1448   NA 143 12/24/2014 1301   K 4.0 04/06/2016 1448   K 3.6 12/24/2014 1301   CL 107 04/06/2016 1448   CO2 27 04/06/2016 1448   CO2 26 12/24/2014 1301   BUN 13 04/06/2016 1448   BUN 12.9 12/24/2014 1301   CREATININE 0.72 04/06/2016 1448   CREATININE 0.8 12/24/2014 1301      Component Value Date/Time   CALCIUM 9.5 04/06/2016 1448   CALCIUM 9.7 12/24/2014 1301   ALKPHOS 104 12/24/2014 1301   AST 32 12/24/2014 1301   ALT 25 12/24/2014 1301   BILITOT 0.39 12/24/2014 1301       Lab Results  Component Value Date   WBC 9.6 04/06/2016   HGB 11.2 (L) 04/06/2016   HCT 37.6 04/06/2016   MCV 66.2 (L) 04/06/2016   PLT 334 04/06/2016   NEUTROABS 5.7 04/06/2016    ASSESSMENT & PLAN:  Breast cancer of lower-outer quadrant of right female breast ([place]) T1No invasive lobular carcinoma of right breast: post lumpectomy with sentinel nodes, local radiation and is taking Tamoxifen daily that was started 11/01/13 discontinued April 2016.  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 The IVC filter was removed in August 2016.   I previously discussed with her about switching from tamoxifen to anastrozole 1 mg daily to complete the rest of the 5 years. But patient refused further antiestrogen treatments.  CT abdomen and pelvis April 2016: Revealed a 2.1 cm lesion between the head and the body of the pancreas. Abdominal MRI was recommended. Patient is extremely angry that this information was not conveyed to her at the time of the scan in April 2016. She wants to speak to  risk management of the hospital. I provided risk management with all of her information on her complaints. Patient states that the only way she will do the MRI of the abdomen is the hospital assumes of financial costs of the abdominal MRI. Our hospital risk management group will be chart to the patient to address her concerns. I acknowledged her frustration and went through all of the scan reports in detail. She had many questions about why only one of the CT scan showed the kidney lesion. I explained to her that when the radiologist states "stable findings" that indicates that there is no change in the lesion in the kidney.  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 I renewed her prescription today.  Return to clinic in 1 year for follow-up  Orders Placed This Encounter  Procedures  . Protime-INR    Standing Status:   Standing    Number of Occurrences:   20    Standing Expiration Date:   08/23/2016   The patient has a good understanding of the overall plan. she agrees with it. she will call with any problems that may develop before the next visit here.   Rulon Eisenmenger, MD 05/25/16   Addendum: Correction of my statement in the assessment and plan I received information regarding pancreatic nodule from Jacinta Shoe, PA-C, anesthesia (not from PCP) on 04/06/16

## 2016-05-28 ENCOUNTER — Other Ambulatory Visit: Payer: Self-pay

## 2016-05-28 ENCOUNTER — Telehealth: Payer: Self-pay | Admitting: Hematology and Oncology

## 2016-05-28 NOTE — Progress Notes (Signed)
Called pt back to let her know that her MRI appt is scheduled for 3pm on Dec 15th (friday). Pt is be NPO for 4 hrs before the exam. Directions given on MRI location and sign in. Told pt that someone from MRI dept will call the day before her exam to go over a checklist of questions before her scheduled MRI. Pt voiced understanding and has no further concerns regarding her test. Pt also questioning why she could not see any of her appt on mychart website. Gave pt information (via vm) to get a hold of technical support [phone] to assist her further with the website issue.

## 2016-05-28 NOTE — Telephone Encounter (Signed)
I called Ms. [name] to inform her that the MRI of the abdomen will be covered by the hospital system. Once the patient gets the call from the hospital, I will request to schedule this abdominal MRI. Further management of the incidentally found nodule in the pancreas will be determined by the findings of the abdominal MRI. Patient is in agreement with this plan.

## 2016-06-04 ENCOUNTER — Ambulatory Visit (HOSPITAL_COMMUNITY)
Admission: RE | Admit: 2016-06-04 | Discharge: 2016-06-04 | Disposition: A | Discharge status: Home / Self Care | Payer: Medicare Other | Source: Ambulatory Visit | Attending: Hematology and Oncology | Admitting: Hematology and Oncology

## 2016-06-04 DIAGNOSIS — C50511 Malignant neoplasm of lower-outer quadrant of right female breast: Secondary | ICD-10-CM | POA: Diagnosis not present

## 2016-06-04 DIAGNOSIS — M488X5 Other specified spondylopathies, thoracolumbar region: Secondary | ICD-10-CM | POA: Diagnosis not present

## 2016-06-04 DIAGNOSIS — K760 Fatty (change of) liver, not elsewhere classified: Secondary | ICD-10-CM | POA: Diagnosis not present

## 2016-06-04 DIAGNOSIS — K449 Diaphragmatic hernia without obstruction or gangrene: Secondary | ICD-10-CM | POA: Diagnosis not present

## 2016-06-04 DIAGNOSIS — K869 Disease of pancreas, unspecified: Secondary | ICD-10-CM | POA: Diagnosis present

## 2016-06-04 LAB — POCT I-STAT CREATININE: Creatinine, Ser: 0.7 mg/dL (ref 0.44–1.00)

## 2016-06-04 MED ORDER — GADOBENATE DIMEGLUMINE 529 MG/ML IV SOLN: 18.0000 mL | Freq: Once | INTRAVENOUS | Status: DC | PRN

## 2016-06-09 ENCOUNTER — Ambulatory Visit (HOSPITAL_COMMUNITY)
Admission: RE | Admit: 2016-06-09 | Discharge: 2016-06-09 | Disposition: A | Discharge status: Home / Self Care | Payer: Medicare Other | Source: Ambulatory Visit | Attending: Hematology and Oncology | Admitting: Hematology and Oncology

## 2016-06-09 DIAGNOSIS — K869 Disease of pancreas, unspecified: Secondary | ICD-10-CM | POA: Insufficient documentation

## 2016-06-09 DIAGNOSIS — M899 Disorder of bone, unspecified: Secondary | ICD-10-CM | POA: Insufficient documentation

## 2016-06-09 DIAGNOSIS — K449 Diaphragmatic hernia without obstruction or gangrene: Secondary | ICD-10-CM | POA: Insufficient documentation

## 2016-06-09 DIAGNOSIS — K760 Fatty (change of) liver, not elsewhere classified: Secondary | ICD-10-CM | POA: Diagnosis not present

## 2016-06-09 DIAGNOSIS — C50511 Malignant neoplasm of lower-outer quadrant of right female breast: Secondary | ICD-10-CM | POA: Insufficient documentation

## 2016-06-09 MED ORDER — GADOBENATE DIMEGLUMINE 529 MG/ML IV SOLN
18.0000 mL | Freq: Once | INTRAVENOUS | Status: AC | PRN
  Administered 2016-06-09: 18 mL via INTRAVENOUS

## 2016-06-10 ENCOUNTER — Telehealth: Payer: Self-pay | Admitting: Hematology and Oncology

## 2016-06-10 ENCOUNTER — Telehealth: Payer: Self-pay

## 2016-06-10 NOTE — Telephone Encounter (Signed)
Attempted to contact pt to have pt come in to discuss MRI results with Dr.Gudena. No answer x2. Did not lvm.

## 2016-06-10 NOTE — Telephone Encounter (Signed)
I reviewed the MRI scan which revealed no evidence of pancreatic lesion. It however showed bone lesions suspicious for metastatic disease. I called the patient at her mobile number and her home number and left messages to call us back. I would like to see the patient today so that I can review the scans and discussed the plan with her. I plan to obtain a PET/CT scan for further evaluation. It is important to note that patient has refused antiestrogen therapy previously. I also discussed with Dr.Ehinger (covering for Dr. Laurann Montana ) who called to inform me that he saw the results. There may also be calling the patient to get her back in touch with [place].

## 2016-06-11 ENCOUNTER — Telehealth: Payer: Self-pay

## 2016-06-11 ENCOUNTER — Other Ambulatory Visit: Payer: Self-pay

## 2016-06-11 DIAGNOSIS — C50511 Malignant neoplasm of lower-outer quadrant of right female breast: Secondary | ICD-10-CM

## 2016-06-11 DIAGNOSIS — Z170 Estrogen receptor positive status [ER+]: Principal | ICD-10-CM

## 2016-06-11 NOTE — Progress Notes (Signed)
Dr.Gudena called pt to discuss MRI results with pt and will need to have PET scan for further eval. Pt prefers afternoon testing if possible and as soon as possible. Will call pt back to confirm schedule and time.

## 2016-06-11 NOTE — Telephone Encounter (Signed)
Called pt to confirm appt for 06/28/16 (monday @ 2pm) Pt to be NPO for 6hrs prior to test. Testing will be done at [facility]. Also scheduled pt for f/u appt with Dr.Gudena 06/30/15 at 2:15pm. Pt read back appointment confirmation and has no further questions at this time.

## 2016-06-28 ENCOUNTER — Encounter (HOSPITAL_COMMUNITY)
Admission: RE | Admit: 2016-06-28 | Discharge: 2016-06-28 | Disposition: A | Discharge status: Home / Self Care | Payer: Medicare Other | Source: Ambulatory Visit | Attending: Hematology and Oncology | Admitting: Hematology and Oncology

## 2016-06-28 DIAGNOSIS — Z170 Estrogen receptor positive status [ER+]: Secondary | ICD-10-CM | POA: Diagnosis present

## 2016-06-28 DIAGNOSIS — C50911 Malignant neoplasm of unspecified site of right female breast: Secondary | ICD-10-CM | POA: Insufficient documentation

## 2016-06-28 DIAGNOSIS — C50511 Malignant neoplasm of lower-outer quadrant of right female breast: Secondary | ICD-10-CM | POA: Diagnosis present

## 2016-06-28 LAB — GLUCOSE, CAPILLARY: Glucose-Capillary: 97 mg/dL (ref 65–99)

## 2016-06-28 MED ORDER — FLUDEOXYGLUCOSE F - 18 (FDG) INJECTION: 9.7000 | Freq: Once | INTRAVENOUS | Status: DC | PRN

## 2016-06-28 NOTE — Assessment & Plan Note (Signed)
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   Tamoxifen toxicities: 1. IVC filter thrombosis as well as the aorta to iliac thrombi:  2. Leg aches and pains related to prior thrombosis: Continue with anticoagulation  Patient discontinued tamoxifen therapy and was not interested in pursuing any further antiestrogen treatment.  MRI abd: no pancreatic lesion. Bone lesions susp for met disease.  PET-CT scan: T12 and L2 Vert bodies are hypermetabolic sugg of metastatic disease. L4 vert body doesnot have hypermet activitiy. Two other faint activity along Ant sup iliac crest and Rt scapular tip (?inflammatory).  Counseling: I discussed with the patient that the PET scan findings suggest metastatic disease I recommended starting aromatase inhibitor therapy.   Return to clinic in 3 months for follow-up 

## 2016-06-29 ENCOUNTER — Ambulatory Visit (HOSPITAL_BASED_OUTPATIENT_CLINIC_OR_DEPARTMENT_OTHER): Payer: Medicare Other | Admitting: Hematology and Oncology

## 2016-06-29 VITALS — BP 150/64 | HR 111 | Temp 97.6°F | Resp 17 | Ht 65.5 in | Wt 192.8 lb

## 2016-06-29 DIAGNOSIS — Z7901 Long term (current) use of anticoagulants: Secondary | ICD-10-CM | POA: Diagnosis not present

## 2016-06-29 DIAGNOSIS — C50511 Malignant neoplasm of lower-outer quadrant of right female breast: Secondary | ICD-10-CM | POA: Diagnosis not present

## 2016-06-29 DIAGNOSIS — Z780 Asymptomatic menopausal state: Secondary | ICD-10-CM

## 2016-06-29 DIAGNOSIS — C7951 Secondary malignant neoplasm of bone: Secondary | ICD-10-CM | POA: Diagnosis not present

## 2016-06-29 DIAGNOSIS — Z170 Estrogen receptor positive status [ER+]: Secondary | ICD-10-CM

## 2016-06-29 DIAGNOSIS — Z86718 Personal history of other venous thrombosis and embolism: Secondary | ICD-10-CM | POA: Diagnosis not present

## 2016-06-29 NOTE — Progress Notes (Signed)
Patient Care Team: Kelton Pillar, MD as PCP - General (Family Medicine) Christene Slates, MD as Physician Assistant (Radiology) Brien Few, MD as Consulting Physician (Obstetrics and Gynecology) Lafayette Dragon, MD (Inactive) as Consulting Physician (Gastroenterology) Marcy Panning, MD as Consulting Physician (Oncology) Alphonsa Overall, MD as Consulting Physician (General Surgery) Eppie Gibson, MD as Consulting Physician (Radiation Oncology) Alphonsa Overall, MD as Consulting Physician (General Surgery) Annia Belt, MD as Consulting Physician (Oncology) Nicholas Lose, MD as Consulting Physician (Hematology and Oncology) Irene Limbo, MD as Consulting Physician (Plastic Surgery)  DIAGNOSIS:  Encounter Diagnoses  Name Primary?  . Malignant neoplasm of lower-outer quadrant of right breast of female, estrogen receptor positive ([place])   . Post-menopausal Yes    SUMMARY OF ONCOLOGIC HISTORY:   Breast cancer of lower-outer quadrant of right female breast ([place])   07/31/2013 Surgery    Right lumpectomy: Invasive lobular cancer, LVID present, BMI present, margins negative, right arm invasive malignant melanoma, 0/4 lymph nodes negative, grade 2, 1.8 cm, T1c N0 stage IA ER 100%, PR 8%, HER-2 negative ratio 1, Ki-67 10%      09/24/2013 - 10/17/2013 Radiation Therapy    Adjuvant radiation therapy 42.56 Gy in 16 fractions      12/03/2013 - 09/24/2014 Anti-estrogen oral therapy    Adjuvant tamoxifen 20 mg daily 5 years      10/10/2014 - 10/26/2014 Hospital Admission    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      06/09/2016 Imaging    MRI Abdomen: No pancreatic mass; enhancement of T 12 vertebral body, enh lesions L2 and L4 vert bodies, susp of bone mets, diffuse hep steatosis      06/28/2016 PET scan    T12 and L2 Vert bodies are hypermetabolic sugg of metastatic disease. L4 vert body doesnot have hypermet activitiy.  Two other faint activity along Ant sup iliac crest and Rt scapular tip (?inflammatory).       CHIEF COMPLIANT: Follow-up to discuss a PET/CT scan  INTERVAL HISTORY: [name] is a 76 year old with above-mentioned history of regional stage I right breast cancer who underwent lumpectomy followed by radiation and was on tamoxifen for about a year when she presented with extensive blood clots and tamoxifen was discontinued. We added that time discuss starting antiestrogen therapy with aromatase inhibitors. She did not want to receive it at that time.  She had a abdominal MRI on 06/09/2016 to evaluate the pancreatic lesion noted on an earlier CT scan. There was no evidence of any pancreatic lesion. The MRI suggested that they might be bone metastases at T12 L2 and L4 vertebral bodies. We performed a PET/CT scan on 06/28/2016 and it does reveal hypermetabolic activity at F42 and L2 vertebral bodies. The rest of activity was not very clear and is not indicating metastatic disease. Patient is here to review the scans and to discuss a treatment plan. She is complaining of her left flank plain related to take 2-3 mm kidney stone. She has had this for the past couple of months.   REVIEW OF SYSTEMS:   Constitutional: Denies fevers, chills or abnormal weight loss Eyes: Denies blurriness of vision Ears, nose, mouth, throat, and face: Denies mucositis or sore throat Respiratory: Denies cough, dyspnea or wheezes Cardiovascular: Denies palpitation, chest discomfort Gastrointestinal: Left flank pain  Skin: Denies abnormal skin rashes Lymphatics: Denies new lymphadenopathy or easy bruising Neurological:Denies numbness, tingling or new weaknesses Behavioral/Psych: Mood is stable, no new changes  Extremities: No lower extremity edema All other systems were reviewed with the patient and are negative.  I have reviewed the past medical history, past surgical history, social history and family history with the  patient and they are unchanged from previous note.  ALLERGIES:  is allergic to no known allergies.  MEDICATIONS:  Current Outpatient Prescriptions  Medication Sig Dispense Refill  . acetaminophen (TYLENOL) 325 MG tablet Take 325-650 mg by mouth every 6 (six) hours as needed (for pain.).    Marland Kitchen cetirizine (ZYRTEC) 10 MG tablet Take 10 mg by mouth at bedtime. Allertec    . Cholecalciferol (VITAMIN D3) 5000 UNITS TABS Take 5,000 Units by mouth at bedtime.     Marland Kitchen HYDROcodone-acetaminophen (NORCO/VICODIN) 5-325 MG tablet Take 1-2 tablets by mouth every 6 (six) hours as needed for moderate pain. 30 tablet 0  . iron polysaccharides (NIFEREX) 150 MG capsule Take 150 mg by mouth at bedtime. 9pm    . Probiotic Product (PROBIOTIC PO) Take 1 tablet by mouth at bedtime.    Marland Kitchen warfarin (COUMADIN) 2 MG tablet Take 1 tablet (2 mg total) by mouth daily. 90 tablet 3   No current facility-administered medications for this visit.    Facility-Administered Medications Ordered in Other Visits  Medication Dose Route Frequency Provider Last Rate Last Dose  . fludeoxyglucose F - 18 (FDG) injection 9.7 millicurie  9.7 millicurie Intravenous Once PRN Rolm Baptise, MD        PHYSICAL EXAMINATION: ECOG PERFORMANCE STATUS: 1 - Symptomatic but completely ambulatory  Vitals:   06/29/16 1356  BP: (!) 150/64  Pulse: (!) 111  Resp: 17  Temp: 97.6 F (36.4 C)   Filed Weights   06/29/16 1356  Weight: 192 lb 12.8 oz (87.5 kg)    GENERAL:alert, no distress and comfortable SKIN: skin color, texture, turgor are normal, no rashes or significant lesions EYES: normal, Conjunctiva are pink and non-injected, sclera clear OROPHARYNX:no exudate, no erythema and lips, buccal mucosa, and tongue normal  NECK: supple, thyroid normal size, non-tender, without nodularity LYMPH:  no palpable lymphadenopathy in the cervical, axillary or inguinal LUNGS: clear to auscultation and percussion with normal breathing effort HEART: regular  rate & rhythm and no murmurs and no lower extremity edema ABDOMEN:abdomen soft, non-tender and normal bowel sounds MUSCULOSKELETAL:no cyanosis of digits and no clubbing  NEURO: alert & oriented x 3 with fluent speech, no focal motor/sensory deficits EXTREMITIES: No lower extremity edema  LABORATORY DATA:  I have reviewed the data as listed   Chemistry      Component Value Date/Time   NA 141 04/06/2016 1448   NA 143 12/24/2014 1301   K 4.0 04/06/2016 1448   K 3.6 12/24/2014 1301   CL 107 04/06/2016 1448   CO2 27 04/06/2016 1448   CO2 26 12/24/2014 1301   BUN 13 04/06/2016 1448   BUN 12.9 12/24/2014 1301   CREATININE 0.70 06/04/2016 1524   CREATININE 0.8 12/24/2014 1301      Component Value Date/Time   CALCIUM 9.5 04/06/2016 1448   CALCIUM 9.7 12/24/2014 1301   ALKPHOS 104 12/24/2014 1301   AST 32 12/24/2014 1301   ALT 25 12/24/2014 1301   BILITOT 0.39 12/24/2014 1301       Lab Results  Component Value Date   WBC 9.6 04/06/2016   HGB 11.2 (L) 04/06/2016   HCT 37.6 04/06/2016   MCV 66.2 (L) 04/06/2016   PLT 334 04/06/2016   NEUTROABS 5.7 04/06/2016    ASSESSMENT & PLAN:  Breast cancer of lower-outer quadrant of right female breast ([place]) T1No invasive lobular carcinoma of right breast: post lumpectomy with sentinel nodes, local radiation and is taking Tamoxifen daily that was started 11/01/13.   Tamoxifen toxicities: 1. IVC filter thrombosis as well as the aorta to iliac thrombi:  2. Leg aches and pains related to prior thrombosis: Continue with anticoagulation  Patient discontinued tamoxifen therapy and was not interested in pursuing any further antiestrogen treatment. MRI abd: no pancreatic lesion. Bone lesions susp for met disease.  PET-CT scan: T12 and L2 Vert bodies are hypermetabolic sugg of metastatic disease. L4 vert body doesnot have hypermet activitiy. Two other faint activity along Ant sup iliac crest and Rt scapular tip  (?inflammatory).  Counseling: I discussed with the patient that the PET scan findings suggest metastatic disease I recommended starting aromatase inhibitor therapy. I sent a prescription for omeprazole previously. She will start taking it. I discussed some other new or agents for metastatic breast cancer which include elbow cycle of Ribociclib. Because she has such scan disease, I did not recommend initiating those treatments at this time.  Patient understands that the antiestrogen therapy is not meant to cure her disease. The goal of treatment is to control her disease and to prolong her life.  Bone metastases: I recommended Xgeva once every 3 months. I instructed her to take calcium and vitamin D.   Return to clinic in 3 month for follow-up and repeat Xgeva.    I spent 25 minutes talking to the patient of which more than half was spent in counseling and coordination of care.  Orders Placed This Encounter  Procedures  . DG Bone Density    Standing Status:   Future    Standing Expiration Date:   06/29/2017    Scheduling Instructions:     PAtient prefers Tuesdays at 2 PM    Order Specific Question:   Reason for Exam (SYMPTOM  OR DIAGNOSIS REQUIRED)    Answer:   Post menopausal    Order Specific Question:   Preferred imaging location?    Answer:   External    Comments:   [facility]   The patient has a good understanding of the overall plan. she agrees with it. she will call with any problems that may develop before the next visit here.   Rulon Eisenmenger, MD 06/29/16

## 2016-06-30 ENCOUNTER — Encounter: Payer: Self-pay | Admitting: Hematology and Oncology

## 2016-07-07 ENCOUNTER — Telehealth: Payer: Self-pay

## 2016-07-07 NOTE — Telephone Encounter (Signed)
Called this pt to confirm appt for tomorrow at 3pm. Pt states that her appt tomorrow was already rescheduled for April. Pt states that she would like to speak with Dr.Gudena regarding her MRI that was performed dec 20th. Pt states that she received a bill in the mail. She had spoken to someone prior to MRI and assured her that she was not going to get any bills in the mail. Pt wanted to make sure that she will not have to do anything else after relaying this message to [place]. Pt did say that she came in for her MRI at a previous date but the MRI was broken at the time, and had to reschedule to 06/09/16. Will notify Dr.Gudena and get this issue resolved for pt. Pt thankful and will wait to hear for updates.

## 2016-07-08 ENCOUNTER — Ambulatory Visit: Payer: Medicare Other | Admitting: Hematology and Oncology

## 2016-07-13 NOTE — Progress Notes (Signed)
Received [facility] bone density report. F/u appt with Dr.Gudena on 10/05/16

## 2016-07-14 ENCOUNTER — Telehealth: Payer: Self-pay | Admitting: Hematology and Oncology

## 2016-07-14 NOTE — Telephone Encounter (Signed)
Telephone Call: I called the patient to give the results of bone density test. Bone density revealed that she has a T score of -1.7 which suggests that she has osteopenia. Compared to 2014 the T score had changed from -1.3 to -1.7. There is no indication at this time for any bisphosphonate therapy. She cannot take calcium because of kidney stones. She will continue with vitamin D for the time being. She will have another bone density in 2 years. All of her questions have been answered.

## 2016-08-24 ENCOUNTER — Other Ambulatory Visit: Payer: Medicare Other

## 2016-09-14 ENCOUNTER — Telehealth: Payer: Self-pay | Admitting: Emergency Medicine

## 2016-09-14 NOTE — Telephone Encounter (Signed)
Dr Lindi Adie called patient to advise her that we are canceling her appointment on 4/10 and we will reschedule that while she is here on 4/17.

## 2016-09-27 IMAGING — CT CT ANGIO CHEST
2 of 9 series · 18 of 46 positions shown · IV contrast (APPLIED)
Comparison: CT abdomen 08/09/2013 and chest x-ray today.

CLINICAL DATA: [facility] today pt was on the toilet and passed out.
Small hematoma to head. [REDACTED] reported that pt was orthostatic and
there was a loss of radial pulse while standing. History of right
breast cancer.

EXAM:
CT ANGIOGRAPHY CHEST WITH CONTRAST
TECHNIQUE: Multidetector CT imaging of the chest was performed using the
standard protocol during bolus administration of intravenous
contrast. Multiplanar CT image reconstructions and MIPs were
obtained to evaluate the vascular anatomy.
CONTRAST:  80mL OMNIPAQUE IOHEXOL 350 MG/ML SOLN

[Series 5: thins · axial · 0.66mm/px · z∈[+1228,+1460]mm · 15 of 262 slices shown]
[im 15/262  lung]
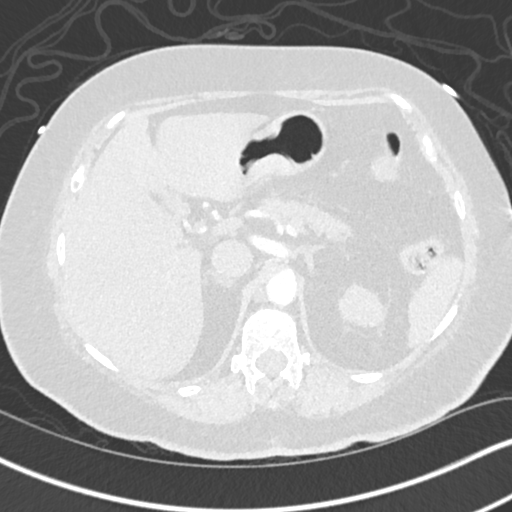
[im 30/262  soft-tissue]
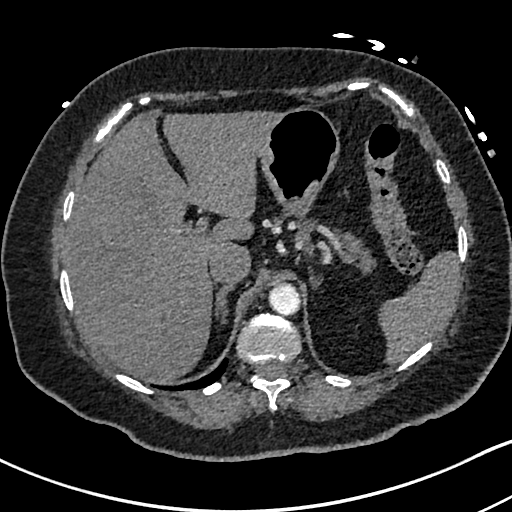
[im 44/262  lung]
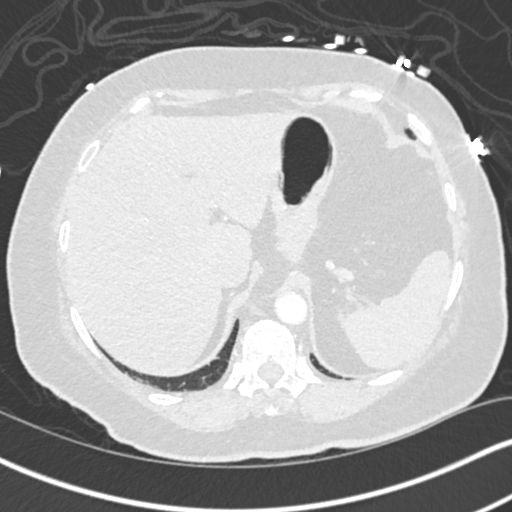
[im 59/262  soft-tissue]
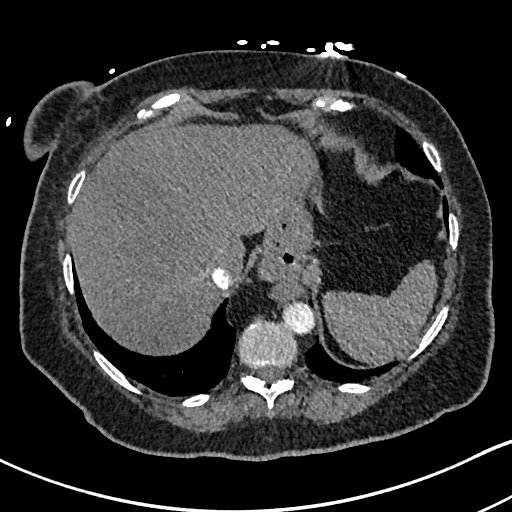
[im 88/262  lung]
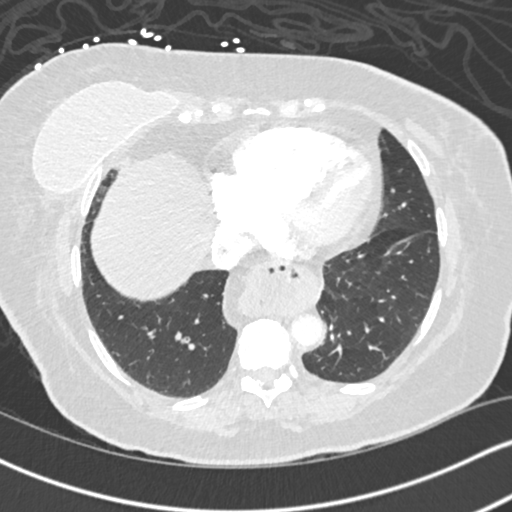
[im 102/262  soft-tissue]
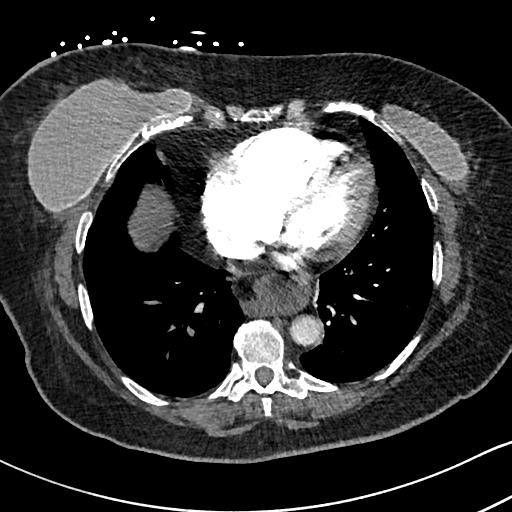
[im 117/262  lung]
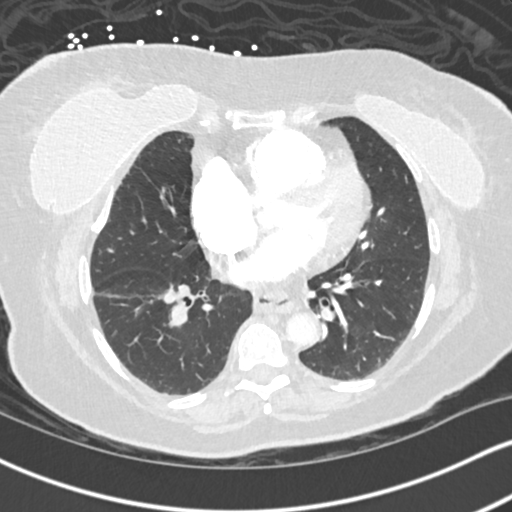
[im 131/262  soft-tissue]
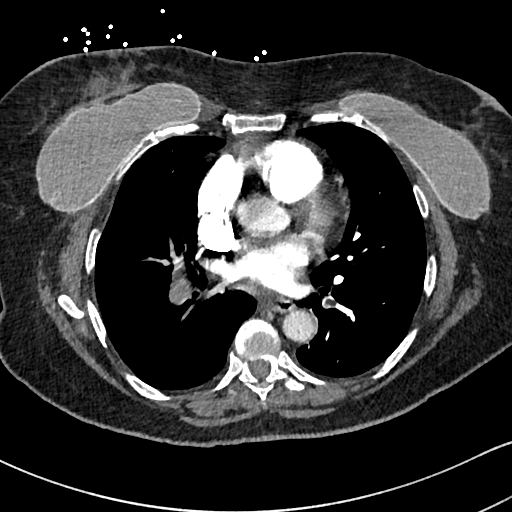
[im 146/262  lung]
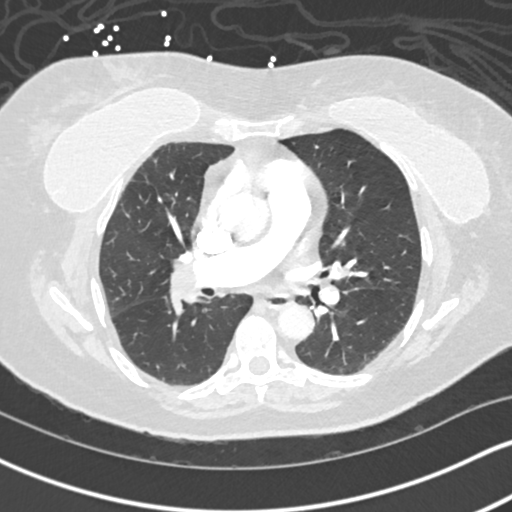
[im 160/262  soft-tissue]
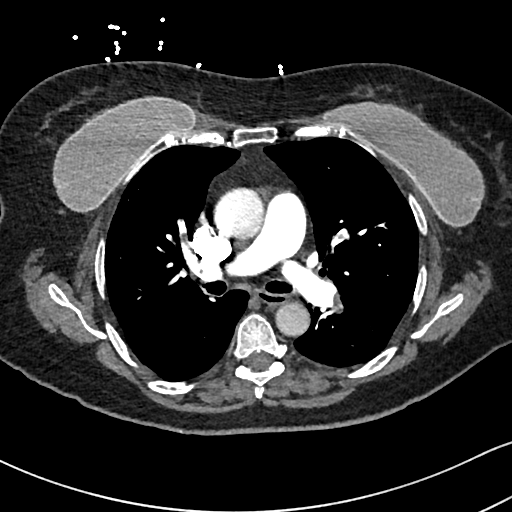
[im 175/262  lung]
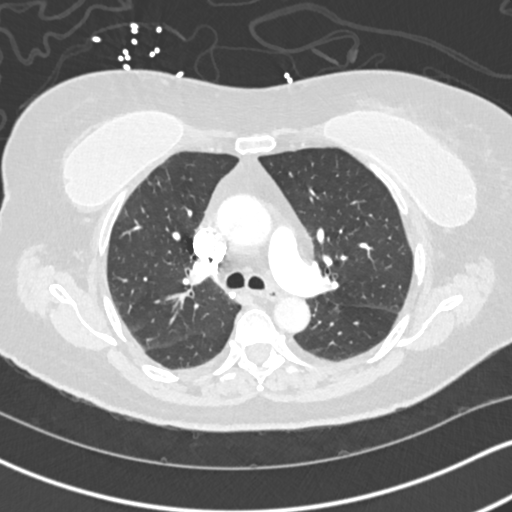
[im 204/262  soft-tissue]
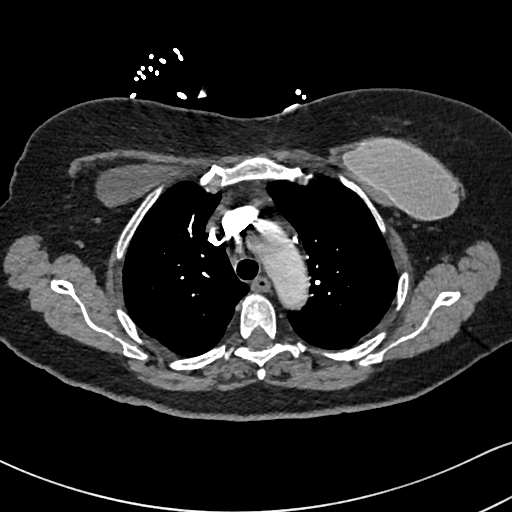
[im 218/262  lung]
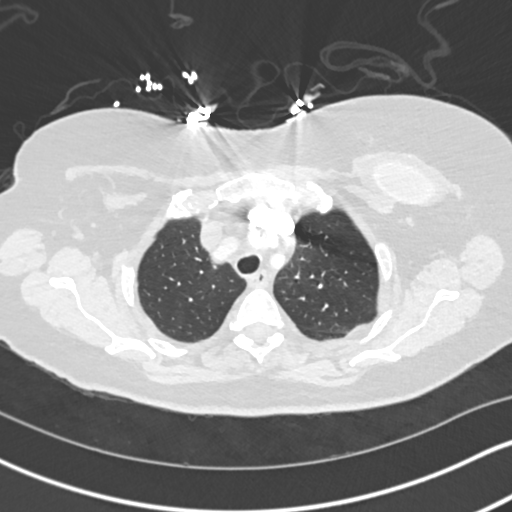
[im 233/262  soft-tissue]
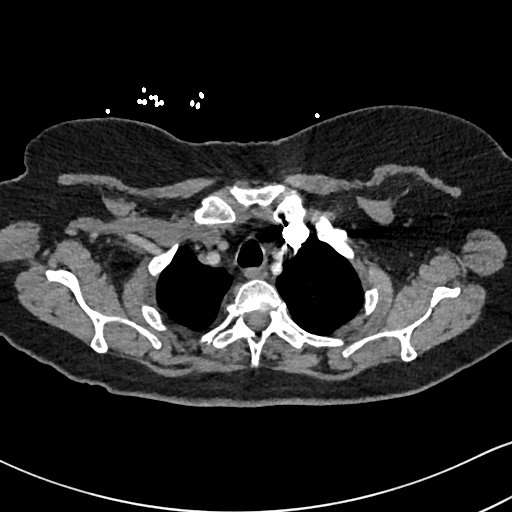
[im 247/262  lung]
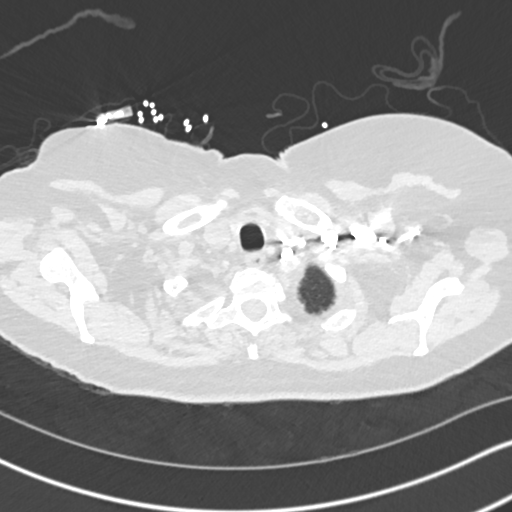

[Series 7: coronal mpr · coronal · 0.53mm/px · 3 of 134 slices shown]
[im 34/134  soft-tissue]
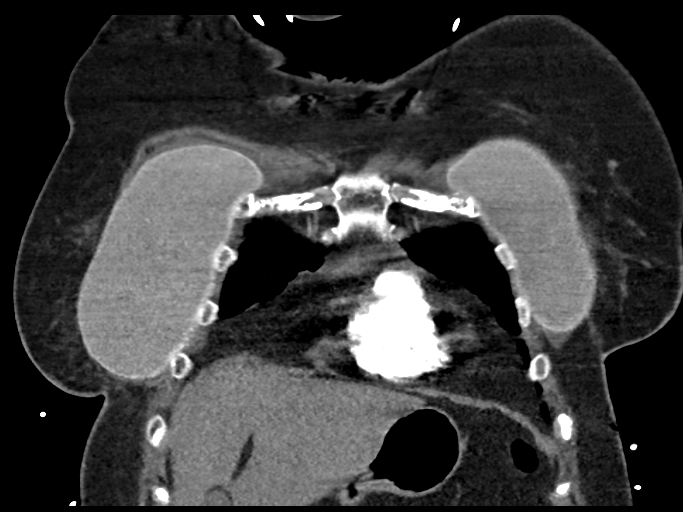
[im 67/134  soft-tissue]
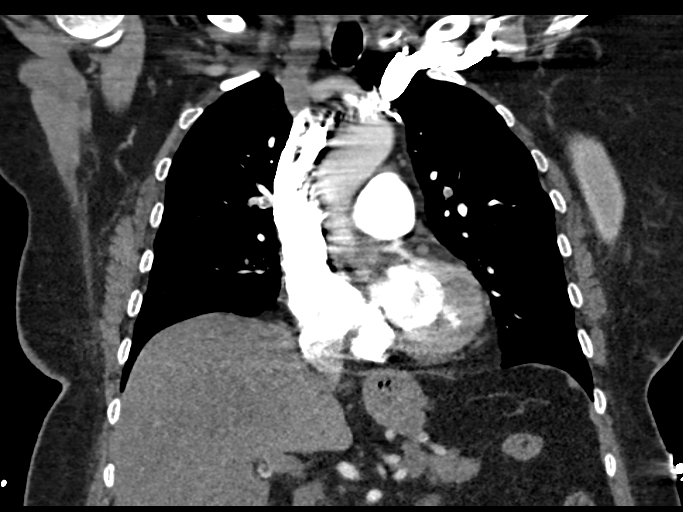
[im 100/134  soft-tissue]
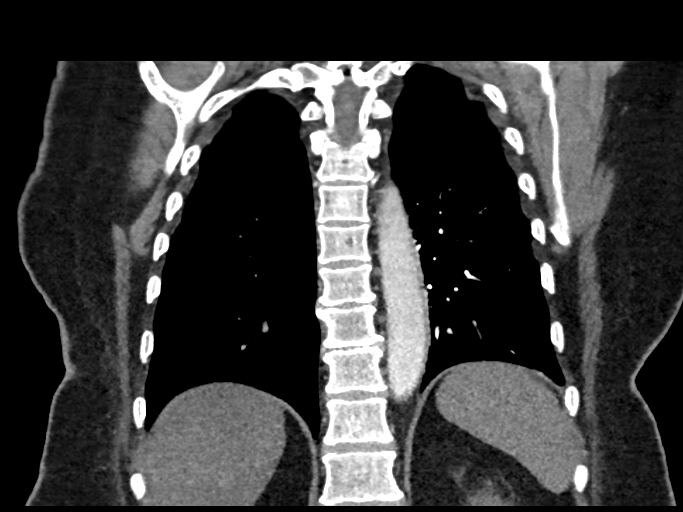

[18 of 46 positions shown; findings below may reference images not displayed]

FINDINGS: Lungs are clear.  Airways are normal.

Heart is normal in size. Pulmonary arterial system is well opacified
and demonstrates moderate burden of emboli over the origin of the
upper and lower lobar pulmonary arteries bilaterally as well as the
proximal lingular and right middle lobe pulmonary arteries. There is
evidence of right heart strain with elevated RV/LV ratio of
mm/27.2 mm = 1.9 (normal less than 0.9).

There is a moderate size hiatal hernia. Remaining mediastinal
structures are unremarkable. Bilateral breast implants present.

Images through the upper abdomen demonstrate a 3 mm calcification
over the upper pole left kidney. Mild degenerative change of the
spine. No acute fractures.

Review of the MIP images confirms the above findings.
IMPRESSION: Moderate burden of bilateral pulmonary emboli. Positive for acute PE
with CT evidence of right heart strain (RV/LV Ratio = 1.9)
consistent with at least submassive (intermediate risk) PE. The
presence of right heart strain has been associated with an increased
risk of morbidity and mortality. Please activate Code PE by paging
[phone].

Moderate size hiatal hernia.

3 mm calcification over the upper pole left renal cortex.

Critical Value/emergent results were called by telephone at the time
of interpretation on 09/27/2014 at [DATE] to Dr. KLEVER JUMPER ,
who verbally acknowledged these results.

## 2016-09-28 ENCOUNTER — Ambulatory Visit: Payer: Medicare Other

## 2016-09-29 ENCOUNTER — Telehealth: Payer: Self-pay | Admitting: Hematology and Oncology

## 2016-09-29 NOTE — Telephone Encounter (Signed)
Call day - moved 4/17 PM appointments to AM. Left message for patient and mailed schedule.

## 2016-10-05 ENCOUNTER — Ambulatory Visit (HOSPITAL_BASED_OUTPATIENT_CLINIC_OR_DEPARTMENT_OTHER): Payer: Medicare Other | Admitting: Hematology and Oncology

## 2016-10-05 ENCOUNTER — Other Ambulatory Visit (HOSPITAL_BASED_OUTPATIENT_CLINIC_OR_DEPARTMENT_OTHER): Payer: Medicare Other

## 2016-10-05 ENCOUNTER — Other Ambulatory Visit: Payer: Self-pay | Admitting: Emergency Medicine

## 2016-10-05 ENCOUNTER — Ambulatory Visit (HOSPITAL_BASED_OUTPATIENT_CLINIC_OR_DEPARTMENT_OTHER): Payer: Medicare Other

## 2016-10-05 ENCOUNTER — Encounter: Payer: Self-pay | Admitting: Hematology and Oncology

## 2016-10-05 DIAGNOSIS — Z170 Estrogen receptor positive status [ER+]: Secondary | ICD-10-CM | POA: Diagnosis not present

## 2016-10-05 DIAGNOSIS — Z79811 Long term (current) use of aromatase inhibitors: Secondary | ICD-10-CM

## 2016-10-05 DIAGNOSIS — C50511 Malignant neoplasm of lower-outer quadrant of right female breast: Secondary | ICD-10-CM

## 2016-10-05 DIAGNOSIS — C7951 Secondary malignant neoplasm of bone: Secondary | ICD-10-CM

## 2016-10-05 DIAGNOSIS — M2550 Pain in unspecified joint: Secondary | ICD-10-CM

## 2016-10-05 LAB — CBC WITH DIFFERENTIAL/PLATELET
BASO%: 1.5 % (ref 0.0–2.0)
Basophils Absolute: 0.1 10*3/uL (ref 0.0–0.1)
EOS%: 4.7 % (ref 0.0–7.0)
Eosinophils Absolute: 0.3 10*3/uL (ref 0.0–0.5)
HCT: 39.9 % (ref 34.8–46.6)
HGB: 12.8 g/dL (ref 11.6–15.9)
LYMPH%: 24.3 % (ref 14.0–49.7)
MCH: 25.1 pg (ref 25.1–34.0)
MCHC: 32 g/dL (ref 31.5–36.0)
MCV: 78.2 fL — ABNORMAL LOW (ref 79.5–101.0)
MONO#: 0.7 10*3/uL (ref 0.1–0.9)
MONO%: 9.3 % (ref 0.0–14.0)
NEUT%: 60.2 % (ref 38.4–76.8)
NEUTROS ABS: 4.3 10*3/uL (ref 1.5–6.5)
Platelets: 274 10*3/uL (ref 145–400)
RBC: 5.1 10*6/uL (ref 3.70–5.45)
RDW: 19 % — ABNORMAL HIGH (ref 11.2–14.5)
WBC: 7.1 10*3/uL (ref 3.9–10.3)
lymph#: 1.7 10*3/uL (ref 0.9–3.3)

## 2016-10-05 LAB — COMPREHENSIVE METABOLIC PANEL
ALT: 25 U/L (ref 0–55)
AST: 28 U/L (ref 5–34)
Albumin: 3.9 g/dL (ref 3.5–5.0)
Alkaline Phosphatase: 103 U/L (ref 40–150)
Anion Gap: 11 mEq/L (ref 3–11)
BILIRUBIN TOTAL: 0.55 mg/dL (ref 0.20–1.20)
BUN: 13.5 mg/dL (ref 7.0–26.0)
CO2: 26 meq/L (ref 22–29)
CREATININE: 0.8 mg/dL (ref 0.6–1.1)
Calcium: 10.5 mg/dL — ABNORMAL HIGH (ref 8.4–10.4)
Chloride: 105 mEq/L (ref 98–109)
EGFR: 77 mL/min/{1.73_m2} — AB (ref >90)
Glucose: 97 mg/dl (ref 70–140)
Potassium: 4.2 mEq/L (ref 3.5–5.1)
SODIUM: 141 meq/L (ref 136–145)
TOTAL PROTEIN: 7 g/dL (ref 6.4–8.3)

## 2016-10-05 MED ORDER — DENOSUMAB 120 MG/1.7ML ~~LOC~~ SOLN
120.0000 mg | Freq: Once | SUBCUTANEOUS | Status: AC
  Administered 2016-10-05: 120 mg via SUBCUTANEOUS
  Filled 2016-10-05: qty 1.7

## 2016-10-05 NOTE — Assessment & Plan Note (Signed)
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  Current treatment:  Anastrozole 1 mg daily Anastrozole toxicities:  Bone metastasis: Xgeva And calcium and vitamin D  Return to clinic in 3 months with scans and follow-up

## 2016-10-05 NOTE — Progress Notes (Signed)
Patient Care Team: Kelton Pillar, MD as PCP - General (Family Medicine) Christene Slates, MD as Physician Assistant (Radiology) Brien Few, MD as Consulting Physician (Obstetrics and Gynecology) Lafayette Dragon, MD (Inactive) as Consulting Physician (Gastroenterology) Marcy Panning, MD as Consulting Physician (Oncology) Alphonsa Overall, MD as Consulting Physician (General Surgery) Eppie Gibson, MD as Consulting Physician (Radiation Oncology) Alphonsa Overall, MD as Consulting Physician (General Surgery) Annia Belt, MD as Consulting Physician (Oncology) Nicholas Lose, MD as Consulting Physician (Hematology and Oncology) Irene Limbo, MD as Consulting Physician (Plastic Surgery)  DIAGNOSIS:  Encounter Diagnosis  Name Primary?  . Malignant neoplasm of lower-outer quadrant of right breast of female, estrogen receptor positive ([place])     SUMMARY OF ONCOLOGIC HISTORY:   Breast cancer of lower-outer quadrant of right female breast ([place])   07/31/2013 Surgery    Right lumpectomy: Invasive lobular cancer, LVID present, BMI present, margins negative, right arm invasive malignant melanoma, 0/4 lymph nodes negative, grade 2, 1.8 cm, T1c N0 stage IA ER 100%, PR 8%, HER-2 negative ratio 1, Ki-67 10%      09/24/2013 - 10/17/2013 Radiation Therapy    Adjuvant radiation therapy 42.56 Gy in 16 fractions      12/03/2013 - 09/24/2014 Anti-estrogen oral therapy    Adjuvant tamoxifen 20 mg daily 5 years      10/10/2014 - 10/26/2014 Hospital Admission    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      06/09/2016 Imaging    MRI Abdomen: No pancreatic mass; enhancement of T 12 vertebral body, enh lesions L2 and L4 vert bodies, susp of bone mets, diffuse hep steatosis      06/28/2016 PET scan    T12 and L2 Vert bodies are hypermetabolic sugg of metastatic disease. L4 vert body doesnot have hypermet activitiy. Two other faint  activity along Ant sup iliac crest and Rt scapular tip (?inflammatory).      07/22/2016 -  Anti-estrogen oral therapy    Letrozole 2.5 mg daily       CHIEF COMPLIANT: Follow-up on letrozole therapy  INTERVAL HISTORY: [name] is a 76 year old with above-mentioned symptoms metastatic breast cancer who is currently on letrozole therapy and is here today to discuss a treatment plan. She is accompanied by her husband who has Parkinson's disease. Her husband requires help getting around because he has broken a few bones in the back and has Parkinson's disease. Patient reports that she is tolerating the letrozole fairly well except for occasional hot flashes as well as a muscle aches and pains especially in the right hip. She denies any pain in the back or scapula or any other location.  REVIEW OF SYSTEMS:   Constitutional: Denies fevers, chills or abnormal weight loss Eyes: Denies blurriness of vision Ears, nose, mouth, throat, and face: Denies mucositis or sore throat Respiratory: Denies cough, dyspnea or wheezes Cardiovascular: Denies palpitation, chest discomfort Gastrointestinal:  Denies nausea, heartburn or change in bowel habits Skin: Denies abnormal skin rashes Lymphatics: Denies new lymphadenopathy or easy bruising Neurological:Denies numbness, tingling or new weaknesses Behavioral/Psych: Mood is stable, no new changes  Extremities: No lower extremity edema  All other systems were reviewed with the patient and are negative.  I have reviewed the past medical history, past surgical history, social history and family history with the patient and they are unchanged from previous note.  ALLERGIES:  is allergic to no known allergies.  MEDICATIONS:  Current Outpatient Prescriptions  Medication Sig Dispense  Refill  . acetaminophen (TYLENOL) 325 MG tablet Take 325-650 mg by mouth every 6 (six) hours as needed (for pain.).    Marland Kitchen cetirizine (ZYRTEC) 10 MG tablet Take 10 mg by mouth  at bedtime. Allertec    . Cholecalciferol (VITAMIN D3) 5000 UNITS TABS Take 5,000 Units by mouth at bedtime.     . iron polysaccharides (NIFEREX) 150 MG capsule Take 150 mg by mouth at bedtime. 9pm    . Probiotic Product (PROBIOTIC PO) Take 1 tablet by mouth at bedtime.    Marland Kitchen warfarin (COUMADIN) 2 MG tablet Take 1 tablet (2 mg total) by mouth daily. (Patient taking differently: Take 2 mg by mouth daily. Alternating 66m and 225m 90 tablet 3   No current facility-administered medications for this visit.     PHYSICAL EXAMINATION: ECOG PERFORMANCE STATUS: 1 - Symptomatic but completely ambulatory  Vitals:   10/05/16 1307  BP: (!) 177/83  Pulse: 70  Resp: 20  Temp: 97.7 F (36.5 C)   Filed Weights   10/05/16 1307  Weight: 202 lb 8 oz (91.9 kg)    GENERAL:alert, no distress and comfortable SKIN: skin color, texture, turgor are normal, no rashes or significant lesions EYES: normal, Conjunctiva are pink and non-injected, sclera clear OROPHARYNX:no exudate, no erythema and lips, buccal mucosa, and tongue normal  NECK: supple, thyroid normal size, non-tender, without nodularity LYMPH:  no palpable lymphadenopathy in the cervical, axillary or inguinal LUNGS: clear to auscultation and percussion with normal breathing effort HEART: regular rate & rhythm and no murmurs and no lower extremity edema ABDOMEN:abdomen soft, non-tender and normal bowel sounds MUSCULOSKELETAL:no cyanosis of digits and no clubbing  NEURO: alert & oriented x 3 with fluent speech, no focal motor/sensory deficits EXTREMITIES: No lower extremity edema  LABORATORY DATA:  I have reviewed the data as listed   Chemistry      Component Value Date/Time   NA 141 10/05/2016 1215   K 4.2 10/05/2016 1215   CL 107 04/06/2016 1448   CO2 26 10/05/2016 1215   BUN 13.5 10/05/2016 1215   CREATININE 0.8 10/05/2016 1215      Component Value Date/Time   CALCIUM 10.5 (H) 10/05/2016 1215   ALKPHOS 103 10/05/2016 1215   AST 28  10/05/2016 1215   ALT 25 10/05/2016 1215   BILITOT 0.55 10/05/2016 1215       Lab Results  Component Value Date   WBC 7.1 10/05/2016   HGB 12.8 10/05/2016   HCT 39.9 10/05/2016   MCV 78.2 (L) 10/05/2016   PLT 274 10/05/2016   NEUTROABS 4.3 10/05/2016    ASSESSMENT & PLAN:  Breast cancer of lower-outer quadrant of right female breast ([place]ast [place] invasive lobular carcinoma of right breast: post lumpectomy with sentinel nodes, local radiation and tookTamoxifen daily that was started 11/01/13.  Current treatment:  Anastrozole 1 mg daily Anastrozole toxicities: 1. Occasional hot flashes 2. arthralgias Bone metastasis: Xgeva And calcium and vitamin D once every 3 months started 10/05/2016 I recommended obtaining a bone scan and follow-up in July. If the scans show progression of disease, then we will consider adding Palbociclib.   I spent 25 minutes talking to the patient of which more than half was spent in counseling and coordination of care.  Orders Placed This Encounter  Procedures  . NM Bone Scan Whole Body    Standing Status:   Future    Standing Expiration Date:   10/05/2017    Order Specific Question:   Reason for Exam (SYMPTOM  OR DIAGNOSIS REQUIRED)    Answer:   Metastatic breast cancer restaging bones    Order Specific Question:   If indicated for the ordered procedure, I authorize the administration of a radiopharmaceutical per Radiology protocol    Answer:   Yes    Order Specific Question:   Preferred imaging location?    Answer:   [facility]    Order Specific Question:   Radiology Contrast Protocol - do NOT remove file path    Answer:   \\[email or web address]   The patient has a good understanding of the overall plan. she agrees with it. she will call with any problems that may develop before the next visit here.   Rulon Eisenmenger, MD 10/05/16

## 2016-10-15 ENCOUNTER — Encounter: Payer: Self-pay | Admitting: Hematology and Oncology

## 2016-12-23 ENCOUNTER — Ambulatory Visit (HOSPITAL_COMMUNITY)
Admission: RE | Admit: 2016-12-23 | Discharge: 2016-12-23 | Disposition: A | Discharge status: Home / Self Care | Payer: Medicare Other | Source: Ambulatory Visit | Attending: Hematology and Oncology | Admitting: Hematology and Oncology

## 2016-12-23 ENCOUNTER — Encounter (HOSPITAL_COMMUNITY): Payer: Medicare Other

## 2016-12-23 DIAGNOSIS — C50511 Malignant neoplasm of lower-outer quadrant of right female breast: Secondary | ICD-10-CM | POA: Diagnosis present

## 2016-12-23 DIAGNOSIS — Z170 Estrogen receptor positive status [ER+]: Secondary | ICD-10-CM | POA: Insufficient documentation

## 2016-12-23 MED ORDER — TECHNETIUM TC 99M MEDRONATE IV KIT
20.0000 | PACK | Freq: Once | INTRAVENOUS | Status: AC | PRN
  Administered 2016-12-23: 20 via INTRAVENOUS

## 2016-12-27 ENCOUNTER — Other Ambulatory Visit: Payer: Self-pay

## 2016-12-27 DIAGNOSIS — Z170 Estrogen receptor positive status [ER+]: Principal | ICD-10-CM

## 2016-12-27 DIAGNOSIS — C50511 Malignant neoplasm of lower-outer quadrant of right female breast: Secondary | ICD-10-CM

## 2016-12-28 ENCOUNTER — Ambulatory Visit (HOSPITAL_BASED_OUTPATIENT_CLINIC_OR_DEPARTMENT_OTHER): Payer: Medicare Other

## 2016-12-28 ENCOUNTER — Other Ambulatory Visit (HOSPITAL_COMMUNITY)
Admission: RE | Admit: 2016-12-28 | Discharge: 2016-12-28 | Disposition: A | Discharge status: Home / Self Care | Payer: Medicare Other | Source: Other Acute Inpatient Hospital | Attending: Hematology and Oncology | Admitting: Hematology and Oncology

## 2016-12-28 ENCOUNTER — Telehealth: Payer: Self-pay

## 2016-12-28 ENCOUNTER — Other Ambulatory Visit (HOSPITAL_BASED_OUTPATIENT_CLINIC_OR_DEPARTMENT_OTHER): Payer: Medicare Other

## 2016-12-28 ENCOUNTER — Ambulatory Visit (HOSPITAL_BASED_OUTPATIENT_CLINIC_OR_DEPARTMENT_OTHER): Payer: Medicare Other | Admitting: Hematology and Oncology

## 2016-12-28 ENCOUNTER — Ambulatory Visit: Payer: Medicare Other

## 2016-12-28 ENCOUNTER — Encounter: Payer: Self-pay | Admitting: Hematology and Oncology

## 2016-12-28 VITALS — BP 148/82 | HR 98 | Temp 98.1°F | Resp 18 | Ht 65.5 in | Wt 209.7 lb

## 2016-12-28 DIAGNOSIS — Z86718 Personal history of other venous thrombosis and embolism: Secondary | ICD-10-CM

## 2016-12-28 DIAGNOSIS — Z79811 Long term (current) use of aromatase inhibitors: Secondary | ICD-10-CM | POA: Diagnosis not present

## 2016-12-28 DIAGNOSIS — M2550 Pain in unspecified joint: Secondary | ICD-10-CM | POA: Diagnosis not present

## 2016-12-28 DIAGNOSIS — Z170 Estrogen receptor positive status [ER+]: Principal | ICD-10-CM

## 2016-12-28 DIAGNOSIS — M899 Disorder of bone, unspecified: Secondary | ICD-10-CM | POA: Diagnosis not present

## 2016-12-28 DIAGNOSIS — C7951 Secondary malignant neoplasm of bone: Secondary | ICD-10-CM

## 2016-12-28 DIAGNOSIS — C50511 Malignant neoplasm of lower-outer quadrant of right female breast: Secondary | ICD-10-CM

## 2016-12-28 LAB — COMPREHENSIVE METABOLIC PANEL
ALT: 13 U/L — AB (ref 14–54)
AST: 18 U/L (ref 15–41)
Albumin: 4 g/dL (ref 3.5–5.0)
Alkaline Phosphatase: 68 U/L (ref 38–126)
Anion gap: 10 (ref 5–15)
BILIRUBIN TOTAL: 0.6 mg/dL (ref 0.3–1.2)
BUN: 12 mg/dL (ref 6–20)
CHLORIDE: 104 mmol/L (ref 101–111)
CO2: 28 mmol/L (ref 22–32)
CREATININE: 0.76 mg/dL (ref 0.44–1.00)
Calcium: 9.1 mg/dL (ref 8.9–10.3)
GFR calc Af Amer: >60 mL/min (ref >60)
GLUCOSE: 108 mg/dL — AB (ref 65–99)
Potassium: 3.5 mmol/L (ref 3.5–5.1)
Sodium: 142 mmol/L (ref 135–145)
Total Protein: 6.9 g/dL (ref 6.5–8.1)

## 2016-12-28 LAB — CBC WITH DIFFERENTIAL/PLATELET
BASO%: 0.3 % (ref 0.0–2.0)
Basophils Absolute: 0 10*3/uL (ref 0.0–0.1)
EOS%: 5.8 % (ref 0.0–7.0)
Eosinophils Absolute: 0.4 10*3/uL (ref 0.0–0.5)
HCT: 36.3 % (ref 34.8–46.6)
HGB: 10.8 g/dL — ABNORMAL LOW (ref 11.6–15.9)
LYMPH#: 1.5 10*3/uL (ref 0.9–3.3)
LYMPH%: 21.5 % (ref 14.0–49.7)
MCH: 21.7 pg — ABNORMAL LOW (ref 25.1–34.0)
MCHC: 29.8 g/dL — ABNORMAL LOW (ref 31.5–36.0)
MCV: 72.9 fL — ABNORMAL LOW (ref 79.5–101.0)
MONO#: 0.7 10*3/uL (ref 0.1–0.9)
MONO%: 10.1 % (ref 0.0–14.0)
NEUT#: 4.2 10*3/uL (ref 1.5–6.5)
NEUT%: 62.3 % (ref 38.4–76.8)
Platelets: 297 10*3/uL (ref 145–400)
RBC: 4.98 10*6/uL (ref 3.70–5.45)
RDW: 17 % — ABNORMAL HIGH (ref 11.2–14.5)
WBC: 6.7 10*3/uL (ref 3.9–10.3)

## 2016-12-28 LAB — PROTIME-INR
INR: 1.8 — ABNORMAL LOW (ref 2.00–3.50)
Protime: 21.6 Seconds — ABNORMAL HIGH (ref 10.6–13.4)

## 2016-12-28 NOTE — Assessment & Plan Note (Signed)
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  Current treatment:  Anastrozole 1 mg daily started 07/22/2016 Anastrozole toxicities: 1. Occasional hot flashes 2. Arthralgias  Bone metastasis: Xgeva And calcium and vitamin D once every 3 months started 10/05/2016  Bone scan 12/23/2016: No evidence of skeletal metastases. It may be because these lesions are below the inability of bone scan to detect them previously that were noted on PET scan and MRI.  Continue with current therapy and return in 3 months for Xgeva and in 6 months for follow-up with me with the bone scan.

## 2016-12-28 NOTE — Telephone Encounter (Signed)
Called and LVM for pt to let her know that her INR was 1.8. Continue coumadine dose 2mg , per Dr.Gudena and recheck inr lab in 3 months.

## 2016-12-28 NOTE — Progress Notes (Signed)
Patient Care Team: Kelton Pillar, MD as PCP - General (Family Medicine) Christene Slates, MD as Physician Assistant (Radiology) Brien Few, MD as Consulting Physician (Obstetrics and Gynecology) Lafayette Dragon, MD (Inactive) as Consulting Physician (Gastroenterology) Marcy Panning, MD as Consulting Physician (Oncology) Alphonsa Overall, MD as Consulting Physician (General Surgery) Eppie Gibson, MD as Consulting Physician (Radiation Oncology) Alphonsa Overall, MD as Consulting Physician (General Surgery) Annia Belt, MD as Consulting Physician (Oncology) Nicholas Lose, MD as Consulting Physician (Hematology and Oncology) Irene Limbo, MD as Consulting Physician (Plastic Surgery)  DIAGNOSIS:  Encounter Diagnoses  Name Primary?  . Malignant neoplasm of lower-outer quadrant of right breast of female, estrogen receptor positive ([place])   . Bone metastasis ([place]) Yes  . Hx of DVT of lower extremity 09/28/14     SUMMARY OF ONCOLOGIC HISTORY:   Breast cancer of lower-outer quadrant of right female breast ([place])   07/31/2013 Surgery    Right lumpectomy: Invasive lobular cancer, LVID present, BMI present, margins negative, right arm invasive malignant melanoma, 0/4 lymph nodes negative, grade 2, 1.8 cm, T1c N0 stage IA ER 100%, PR 8%, HER-2 negative ratio 1, Ki-67 10%      09/24/2013 - 10/17/2013 Radiation Therapy    Adjuvant radiation therapy 42.56 Gy in 16 fractions      12/03/2013 - 09/24/2014 Anti-estrogen oral therapy    Adjuvant tamoxifen 20 mg daily 5 years      10/10/2014 - 10/26/2014 Hospital Admission    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      06/09/2016 Imaging    MRI Abdomen: No pancreatic mass; enhancement of T 12 vertebral body, enh lesions L2 and L4 vert bodies, susp of bone mets, diffuse hep steatosis      06/28/2016 PET scan    T12 and L2 Vert bodies are hypermetabolic sugg of  metastatic disease. L4 vert body doesnot have hypermet activitiy. Two other faint activity along Ant sup iliac crest and Rt scapular tip (?inflammatory).      07/22/2016 -  Anti-estrogen oral therapy    Letrozole 2.5 mg daily      12/23/2016 Imaging    Bone scan: No evidence of skeletal metastases. It may be because these lesions are below the inability of bone scan to detect them previously that were noted on PET scan and MRI       CHIEF COMPLIANT: Follow-up after recent bone scan  INTERVAL HISTORY: [name] is a 76 year old with above-mentioned history of metastatic breast cancer who is here for a follow-up visit after undergoing bone scan. Previously she had T12 and L2 vertebral body hypermetabolic disease on a PET/CT scan in January and MRI of the spine also revealed that these were metastatic sites. She has been on oral letrozole therapy since February 2018 and has tolerated it extremely well. She underwent a recent bone scan is here today to discuss the results.  REVIEW OF SYSTEMS:   Constitutional: Denies fevers, chills or abnormal weight loss Eyes: Denies blurriness of vision Ears, nose, mouth, throat, and face: Denies mucositis or sore throat Respiratory: Denies cough, dyspnea or wheezes Cardiovascular: Denies palpitation, chest discomfort Gastrointestinal:  Denies nausea, heartburn or change in bowel habits Skin: Denies abnormal skin rashes Lymphatics: Denies new lymphadenopathy or easy bruising Neurological:Denies numbness, tingling or new weaknesses Behavioral/Psych: Mood is stable, no new changes  Extremities: No lower extremity edema  All other systems were reviewed with the patient and are negative.  I have  reviewed the past medical history, past surgical history, social history and family history with the patient and they are unchanged from previous note.  ALLERGIES:  is allergic to no known allergies.  MEDICATIONS:  Current Outpatient Prescriptions    Medication Sig Dispense Refill  . acetaminophen (TYLENOL) 325 MG tablet Take 325-650 mg by mouth every 6 (six) hours as needed (for pain.).    Marland Kitchen cetirizine (ZYRTEC) 10 MG tablet Take 10 mg by mouth at bedtime. Allertec    . Cholecalciferol (VITAMIN D3) 5000 UNITS TABS Take 5,000 Units by mouth at bedtime.     . iron polysaccharides (NIFEREX) 150 MG capsule Take 150 mg by mouth at bedtime. 9pm    . Probiotic Product (PROBIOTIC PO) Take 1 tablet by mouth at bedtime.    Marland Kitchen warfarin (COUMADIN) 2 MG tablet Take 1 tablet (2 mg total) by mouth daily. (Patient taking differently: Take 2 mg by mouth daily. Alternating 78m and 229m 90 tablet 3   No current facility-administered medications for this visit.     PHYSICAL EXAMINATION: ECOG PERFORMANCE STATUS: 1 - Symptomatic but completely ambulatory  Vitals:   12/28/16 1432  BP: (!) 148/82  Pulse: 98  Resp: 18  Temp: 98.1 F (36.7 C)   Filed Weights   12/28/16 1432  Weight: 209 lb 11.2 oz (95.1 kg)    GENERAL:alert, no distress and comfortable SKIN: skin color, texture, turgor are normal, no rashes or significant lesions EYES: normal, Conjunctiva are pink and non-injected, sclera clear OROPHARYNX:no exudate, no erythema and lips, buccal mucosa, and tongue normal  NECK: supple, thyroid normal size, non-tender, without nodularity LYMPH:  no palpable lymphadenopathy in the cervical, axillary or inguinal LUNGS: clear to auscultation and percussion with normal breathing effort HEART: regular rate & rhythm and no murmurs and no lower extremity edema ABDOMEN:abdomen soft, non-tender and normal bowel sounds MUSCULOSKELETAL:no cyanosis of digits and no clubbing  NEURO: alert & oriented x 3 with fluent speech, no focal motor/sensory deficits EXTREMITIES: No lower extremity edema  LABORATORY DATA:  I have reviewed the data as listed   Chemistry      Component Value Date/Time   NA 141 10/05/2016 1215   K 4.2 10/05/2016 1215   CL 107 04/06/2016  1448   CO2 26 10/05/2016 1215   BUN 13.5 10/05/2016 1215   CREATININE 0.8 10/05/2016 1215      Component Value Date/Time   CALCIUM 10.5 (H) 10/05/2016 1215   ALKPHOS 103 10/05/2016 1215   AST 28 10/05/2016 1215   ALT 25 10/05/2016 1215   BILITOT 0.55 10/05/2016 1215       Lab Results  Component Value Date   WBC 6.7 12/28/2016   HGB 10.8 (L) 12/28/2016   HCT 36.3 12/28/2016   MCV 72.9 (L) 12/28/2016   PLT 297 12/28/2016   NEUTROABS 4.2 12/28/2016    ASSESSMENT & PLAN:  Breast cancer of lower-outer quadrant of right female breast ([place]1N0 invasive lobular carcinoma of right breast: post lumpectomy with sentinel nodes, local radiation and took Tamoxifen 12/03/2013- 09/24/2014.  Current treatment:  Anastrozole 1 mg daily started 07/22/2016  Anastrozole toxicities: 1. Occasional hot flashes 2. Arthralgias  Bone metastasis: Xgeva And calcium and vitamin D once every 3 months started 10/05/2016 Discontinued when bone scan suggested no evidence of metastases  Bone scan 12/23/2016: No evidence of skeletal metastases. It may be because these lesions are below the inability of bone scan to detect them previously that were noted on PET scan and MRI.  Based on negative bone scan results, after lengthy discussion we decided to discontinue Xgeva at this time.  Continue with current therapy and return in 3 months for INR check and in 6 months for follow-up with me with the bone scan.  I spent 25 minutes talking to the patient of which more than half was spent in counseling and coordination of care.  Orders Placed This Encounter  Procedures  . MM DIAG BREAST TOMO BILATERAL    Standing Status:   Future    Standing Expiration Date:   12/28/2017    Order Specific Question:   Reason for Exam (SYMPTOM  OR DIAGNOSIS REQUIRED)    Answer:   Annual mammograms with breast cancer history (has implants)    Order Specific Question:   Preferred imaging location?    Answer:   External     Comments:   [facility]  . NM Bone Scan Multiple    Standing Status:   Future    Standing Expiration Date:   12/28/2017    Order Specific Question:   Reason for Exam (SYMPTOM  OR DIAGNOSIS REQUIRED)    Answer:   Bone mets on PET scan evaluation    Order Specific Question:   If indicated for the ordered procedure, I authorize the administration of a radiopharmaceutical per Radiology protocol    Answer:   Yes    Order Specific Question:   Preferred imaging location?    Answer:   [facility]    Order Specific Question:   Radiology Contrast Protocol - do NOT remove file path    Answer:   \\[email or web address]  . Protime-INR    Standing Status:   Standing    Number of Occurrences:   20    Standing Expiration Date:   06/30/2018   The patient has a good understanding of the overall plan. she agrees with it. she will call with any problems that may develop before the next visit here.   Rulon Eisenmenger, MD 12/28/16

## 2016-12-30 ENCOUNTER — Other Ambulatory Visit: Payer: Self-pay | Admitting: Hematology and Oncology

## 2016-12-31 ENCOUNTER — Other Ambulatory Visit: Payer: Self-pay

## 2016-12-31 DIAGNOSIS — C50511 Malignant neoplasm of lower-outer quadrant of right female breast: Secondary | ICD-10-CM

## 2016-12-31 DIAGNOSIS — Z170 Estrogen receptor positive status [ER+]: Principal | ICD-10-CM

## 2017-02-16 DIAGNOSIS — C4361 Malignant melanoma of right upper limb, including shoulder: Secondary | ICD-10-CM | POA: Insufficient documentation

## 2017-03-21 ENCOUNTER — Other Ambulatory Visit: Payer: Self-pay | Admitting: Hematology and Oncology

## 2017-03-23 ENCOUNTER — Telehealth: Payer: Self-pay

## 2017-03-23 NOTE — Telephone Encounter (Signed)
Pt calling to cancel bone scan appt for December 2018. Pt states that she recently saw an oncologist at [facility] for a second opinion. Pt had PET scan performed which suggest that she didn't need the bone scan for December 2018. Pt would like to come in to discuss Xgeva injections the next time she comes in next week for lab appt. Pt will bring in paperwork from her oncologist at [facility], along with her recent scans done. Notified Dr.Gudena and will cancel bone scans for December 2018. MD appt added to 03/30/17 lab appt. Pt confirmed this time/date. No further questions or concerns at this time.

## 2017-03-30 ENCOUNTER — Other Ambulatory Visit (HOSPITAL_BASED_OUTPATIENT_CLINIC_OR_DEPARTMENT_OTHER): Payer: Medicare Other

## 2017-03-30 ENCOUNTER — Telehealth: Payer: Self-pay | Admitting: Hematology and Oncology

## 2017-03-30 ENCOUNTER — Ambulatory Visit (HOSPITAL_BASED_OUTPATIENT_CLINIC_OR_DEPARTMENT_OTHER): Payer: Medicare Other | Admitting: Hematology and Oncology

## 2017-03-30 VITALS — BP 151/82 | HR 69 | Temp 97.4°F | Resp 17 | Ht 65.5 in | Wt 201.0 lb

## 2017-03-30 DIAGNOSIS — Z79811 Long term (current) use of aromatase inhibitors: Secondary | ICD-10-CM | POA: Diagnosis not present

## 2017-03-30 DIAGNOSIS — C7951 Secondary malignant neoplasm of bone: Secondary | ICD-10-CM

## 2017-03-30 DIAGNOSIS — Z170 Estrogen receptor positive status [ER+]: Secondary | ICD-10-CM | POA: Diagnosis not present

## 2017-03-30 DIAGNOSIS — C50511 Malignant neoplasm of lower-outer quadrant of right female breast: Secondary | ICD-10-CM | POA: Diagnosis not present

## 2017-03-30 DIAGNOSIS — Z86718 Personal history of other venous thrombosis and embolism: Secondary | ICD-10-CM

## 2017-03-30 DIAGNOSIS — Z85820 Personal history of malignant melanoma of skin: Secondary | ICD-10-CM

## 2017-03-30 LAB — PROTIME-INR
INR: 2 (ref 2.00–3.50)
Protime: 24 Seconds — ABNORMAL HIGH (ref 10.6–13.4)

## 2017-03-30 MED ORDER — DENOSUMAB 120 MG/1.7ML ~~LOC~~ SOLN
120.0000 mg | Freq: Once | SUBCUTANEOUS | Status: AC
  Administered 2017-03-30: 120 mg via SUBCUTANEOUS
  Filled 2017-03-30: qty 1.7

## 2017-03-30 NOTE — Assessment & Plan Note (Signed)
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  Current treatment:  Anastrozole 1 mg daily started 07/22/2016 Anastrozole toxicities: 1. Occasional hot flashes 2. Arthralgias  Bone metastasis: Xgeva And calcium and vitamin D once every 3 months started 10/05/2016  Patient had a second opinion at [facility] with Dr. Carin Hock: There are recommended continuation of the same treatment that she is on currently. They performed a PET/CT scan that showed bone metastases. They also performed circulating tumor cells (0) as well as CA-27-29. I discussed with the patient that neither of the above tests are useful in determining her treatment plan.  We will continue the current therapy with antiestrogen letrozole and she'll come back once every 3 months for Xgeva injections.  We'll plan on doing a PET/CT scan in 6 months.  Continue with current therapy and return in 3 months for Xgeva and in 6 months for follow-up with me with the bone scan.

## 2017-03-30 NOTE — Telephone Encounter (Signed)
Scheduled appts per 10/10 los. Patient did not want avs or calendar.

## 2017-03-30 NOTE — Progress Notes (Signed)
Patient Care Team: Kelton Pillar, MD as PCP - General (Family Medicine) Christene Slates, MD as Physician Assistant (Radiology) Brien Few, MD as Consulting Physician (Obstetrics and Gynecology) Lafayette Dragon, MD (Inactive) as Consulting Physician (Gastroenterology) Marcy Panning, MD as Consulting Physician (Oncology) Alphonsa Overall, MD as Consulting Physician (General Surgery) Eppie Gibson, MD as Consulting Physician (Radiation Oncology) Alphonsa Overall, MD as Consulting Physician (General Surgery) Annia Belt, MD as Consulting Physician (Oncology) Nicholas Lose, MD as Consulting Physician (Hematology and Oncology) Irene Limbo, MD as Consulting Physician (Plastic Surgery)  DIAGNOSIS:  Encounter Diagnoses  Name Primary?  . Malignant neoplasm of lower-outer quadrant of right breast of female, estrogen receptor positive ([place]) Yes  . Bone metastasis ([place])     SUMMARY OF ONCOLOGIC HISTORY:   Breast cancer of lower-outer quadrant of right female breast ([place])   07/31/2013 Surgery    Right lumpectomy: Invasive lobular cancer, LVID present, BMI present, margins negative, right arm invasive malignant melanoma, 0/4 lymph nodes negative, grade 2, 1.8 cm, T1c N0 stage IA ER 100%, PR 8%, HER-2 negative ratio 1, Ki-67 10%      09/24/2013 - 10/17/2013 Radiation Therapy    Adjuvant radiation therapy 42.56 Gy in 16 fractions      12/03/2013 - 09/24/2014 Anti-estrogen oral therapy    Adjuvant tamoxifen 20 mg daily 5 years      10/10/2014 - 10/26/2014 Hospital Admission    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      06/09/2016 Imaging    MRI Abdomen: No pancreatic mass; enhancement of T 12 vertebral body, enh lesions L2 and L4 vert bodies, susp of bone mets, diffuse hep steatosis      06/28/2016 PET scan    T12 and L2 Vert bodies are hypermetabolic sugg of metastatic disease. L4 vert body doesnot have  hypermet activitiy. Two other faint activity along Ant sup iliac crest and Rt scapular tip (?inflammatory).      07/22/2016 -  Anti-estrogen oral therapy    Letrozole 2.5 mg daily      12/23/2016 Imaging    Bone scan: No evidence of skeletal metastases. It may be because these lesions are below the inability of bone scan to detect them previously that were noted on PET scan and MRI       CHIEF COMPLIANT: Follow-up after recent second opinion consultation at [facility] with Dr. Westley Foots  INTERVAL HISTORY: [name] is a 76 year old with above-mentioned history of metastatic breast cancer with bone metastases is currently on letrozole therapy since February 2018. She went to [facility] at the insistence of her health insurance company [employer]. They performed a PET/CT scan as well as multiple other tests including circulating tumor cells and CA-27-29 and informed her to continue with the same treatment. They also recommended Xgeva for her bone metastases. She is tolerating the letrozole fairly well except for intermittent muscle aches especially in her thumb  REVIEW OF SYSTEMS:   Constitutional: Denies fevers, chills or abnormal weight loss Eyes: Denies blurriness of vision Ears, nose, mouth, throat, and face: Denies mucositis or sore throat Respiratory: Denies cough, dyspnea or wheezes Cardiovascular: Denies palpitation, chest discomfort Gastrointestinal:  Denies nausea, heartburn or change in bowel habits Skin: Denies abnormal skin rashes Lymphatics: Denies new lymphadenopathy or easy bruising Neurological:Denies numbness, tingling or new weaknesses Behavioral/Psych: Mood is stable, no new changes  Extremities: No lower extremity edema  All other systems were reviewed with the patient  and are negative.  I have reviewed the past medical history, past surgical history, social history and family history with the patient and they are unchanged from previous  note.  ALLERGIES:  is allergic to no known allergies.  MEDICATIONS:  Current Outpatient Prescriptions  Medication Sig Dispense Refill  . Ascorbic Acid (VITAMIN C PO) Take by mouth.    Marland Kitchen CALCIUM ASCORBATE PO Take by mouth.    . Multiple Vitamins-Minerals (MULTIVITAMIN ADULTS PO) Take by mouth.    Marland Kitchen acetaminophen (TYLENOL) 325 MG tablet Take 325-650 mg by mouth every 6 (six) hours as needed (for pain.).    Marland Kitchen cetirizine (ZYRTEC) 10 MG tablet Take 10 mg by mouth at bedtime. Allertec    . Cholecalciferol (VITAMIN D3) 5000 UNITS TABS Take 5,000 Units by mouth at bedtime.     . iron polysaccharides (NIFEREX) 150 MG capsule Take 150 mg by mouth at bedtime. 9pm    . letrozole (FEMARA) 2.5 MG tablet TAKE 1 TABLET (2.5 MG TOTAL) DAILY 90 tablet 3  . Probiotic Product (PROBIOTIC PO) Take 1 tablet by mouth at bedtime.    Marland Kitchen warfarin (COUMADIN) 2 MG tablet TAKE 1 TABLET DAILY 90 tablet 3   No current facility-administered medications for this visit.     PHYSICAL EXAMINATION: ECOG PERFORMANCE STATUS: 1 - Symptomatic but completely ambulatory  Vitals:   03/30/17 1209  BP: (!) 151/82  Pulse: 69  Resp: 17  Temp: (!) 97.4 F (36.3 C)  SpO2: 98%   Filed Weights   03/30/17 1209  Weight: 201 lb (91.2 kg)    GENERAL:alert, no distress and comfortable SKIN: skin color, texture, turgor are normal, no rashes or significant lesions EYES: normal, Conjunctiva are pink and non-injected, sclera clear OROPHARYNX:no exudate, no erythema and lips, buccal mucosa, and tongue normal  NECK: supple, thyroid normal size, non-tender, without nodularity LYMPH:  no palpable lymphadenopathy in the cervical, axillary or inguinal LUNGS: clear to auscultation and percussion with normal breathing effort HEART: regular rate & rhythm and no murmurs and no lower extremity edema ABDOMEN:abdomen soft, non-tender and normal bowel sounds MUSCULOSKELETAL:no cyanosis of digits and no clubbing  NEURO: alert & oriented x 3 with  fluent speech, no focal motor/sensory deficits EXTREMITIES: No lower extremity edema  LABORATORY DATA:  I have reviewed the data as listed   Chemistry      Component Value Date/Time   NA 142 12/28/2016 1407   NA 141 10/05/2016 1215   K 3.5 12/28/2016 1407   K 4.2 10/05/2016 1215   CL 104 12/28/2016 1407   CO2 28 12/28/2016 1407   CO2 26 10/05/2016 1215   BUN 12 12/28/2016 1407   BUN 13.5 10/05/2016 1215   CREATININE 0.76 12/28/2016 1407   CREATININE 0.8 10/05/2016 1215      Component Value Date/Time   CALCIUM 9.1 12/28/2016 1407   CALCIUM 10.5 (H) 10/05/2016 1215   ALKPHOS 68 12/28/2016 1407   ALKPHOS 103 10/05/2016 1215   AST 18 12/28/2016 1407   AST 28 10/05/2016 1215   ALT 13 (L) 12/28/2016 1407   ALT 25 10/05/2016 1215   BILITOT 0.6 12/28/2016 1407   BILITOT 0.55 10/05/2016 1215       Lab Results  Component Value Date   WBC 6.7 12/28/2016   HGB 10.8 (L) 12/28/2016   HCT 36.3 12/28/2016   MCV 72.9 (L) 12/28/2016   PLT 297 12/28/2016   NEUTROABS 4.2 12/28/2016    ASSESSMENT & PLAN:  Breast cancer of lower-outer quadrant  of right female breast ([place]) 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  Current treatment:  Anastrozole 1 mg daily started 07/22/2016 Anastrozole toxicities: 1. Occasional hot flashes 2. Arthralgias  Bone metastasis: Xgeva And calcium and vitamin D once every 3 months started 10/05/2016  Patient had a second opinion at [facility] with Dr. Carin Hock: There are recommended continuation of the same treatment that she is on currently. They performed a PET/CT scan that showed bone metastases. They also performed circulating tumor cells (0) as well as CA-27-29. I discussed with the patient that neither of the above tests are useful in determining her treatment plan.  We will continue the current therapy with antiestrogen letrozole and she'll  come back once every 3 months for Xgeva injections.  We'll plan on doing a PET/CT scan in 6 months.  Continue with current therapy and return in 3 months for Xgeva and in 6 months for follow-up with me with the bone scan.    I spent 25 minutes talking to the patient of which more than half was spent in counseling and coordination of care.  Orders Placed This Encounter  Procedures  . CBC with Differential    Standing Status:   Standing    Number of Occurrences:   10    Standing Expiration Date:   03/30/2018  . Comprehensive metabolic panel    Standing Status:   Standing    Number of Occurrences:   10    Standing Expiration Date:   03/30/2018  . Protime-INR    Standing Status:   Standing    Number of Occurrences:   20    Standing Expiration Date:   03/30/2018   The patient has a good understanding of the overall plan. she agrees with it. she will call with any problems that may develop before the next visit here.   Rulon Eisenmenger, MD 03/30/17

## 2017-03-30 NOTE — Patient Instructions (Signed)

## 2017-04-05 ENCOUNTER — Encounter: Payer: Self-pay | Admitting: Hematology and Oncology

## 2017-06-02 ENCOUNTER — Other Ambulatory Visit (HOSPITAL_COMMUNITY): Payer: Medicare Other

## 2017-06-02 ENCOUNTER — Ambulatory Visit (HOSPITAL_COMMUNITY): Payer: Medicare Other

## 2017-06-29 NOTE — Assessment & Plan Note (Signed)
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  Current treatment: Anastrozole 1 mg daily started 07/22/2016 Anastrozole toxicities: 1. Occasional hot flashes 2. Arthralgias  Bone metastasis: Xgeva And calcium and vitamin D once every 3 months started 10/05/2016  We will continue the current therapy with antiestrogen letrozole and she'll come back once every 3 months for Xgeva injections.  We'll plan on doing a PET/CT scan in 3 months.  Continue with current therapy and return in 3 months for Xgeva and in 6 months for follow-up with me with the bone scan.

## 2017-06-30 ENCOUNTER — Inpatient Hospital Stay (HOSPITAL_BASED_OUTPATIENT_CLINIC_OR_DEPARTMENT_OTHER): Payer: Medicare Other | Admitting: Hematology and Oncology

## 2017-06-30 ENCOUNTER — Inpatient Hospital Stay: Payer: Medicare Other

## 2017-06-30 ENCOUNTER — Telehealth: Payer: Self-pay | Admitting: Hematology and Oncology

## 2017-06-30 ENCOUNTER — Ambulatory Visit: Payer: Medicare Other | Admitting: Hematology and Oncology

## 2017-06-30 ENCOUNTER — Inpatient Hospital Stay: Payer: Medicare Other | Attending: Hematology and Oncology

## 2017-06-30 ENCOUNTER — Other Ambulatory Visit: Payer: Medicare Other

## 2017-06-30 DIAGNOSIS — C7951 Secondary malignant neoplasm of bone: Secondary | ICD-10-CM

## 2017-06-30 DIAGNOSIS — Z86718 Personal history of other venous thrombosis and embolism: Secondary | ICD-10-CM | POA: Diagnosis not present

## 2017-06-30 DIAGNOSIS — C50511 Malignant neoplasm of lower-outer quadrant of right female breast: Secondary | ICD-10-CM

## 2017-06-30 DIAGNOSIS — Z170 Estrogen receptor positive status [ER+]: Secondary | ICD-10-CM | POA: Diagnosis not present

## 2017-06-30 DIAGNOSIS — Z79811 Long term (current) use of aromatase inhibitors: Secondary | ICD-10-CM | POA: Diagnosis not present

## 2017-06-30 LAB — CBC WITH DIFFERENTIAL/PLATELET
BASOS ABS: 0 10*3/uL (ref 0.0–0.1)
Basophils Relative: 0 %
EOS PCT: 3 %
Eosinophils Absolute: 0.2 10*3/uL (ref 0.0–0.5)
HEMATOCRIT: 46 % (ref 34.8–46.6)
Hemoglobin: 14.8 g/dL (ref 11.6–15.9)
LYMPHS ABS: 1.4 10*3/uL (ref 0.9–3.3)
LYMPHS PCT: 20 %
MCH: 29.5 pg (ref 25.1–34.0)
MCHC: 32.2 g/dL (ref 31.5–36.0)
MCV: 91.6 fL (ref 79.5–101.0)
MONO ABS: 0.4 10*3/uL (ref 0.1–0.9)
MONOS PCT: 6 %
NEUTROS ABS: 5.1 10*3/uL (ref 1.5–6.5)
Neutrophils Relative %: 71 %
PLATELETS: 198 10*3/uL (ref 145–400)
RBC: 5.02 MIL/uL (ref 3.70–5.45)
RDW: 14.3 % (ref 11.2–16.1)
WBC: 7.1 10*3/uL (ref 3.9–10.3)

## 2017-06-30 LAB — COMPREHENSIVE METABOLIC PANEL
ALT: 23 U/L (ref 0–55)
AST: 21 U/L (ref 5–34)
Albumin: 4 g/dL (ref 3.5–5.0)
Alkaline Phosphatase: 86 U/L (ref 40–150)
Anion gap: 11 (ref 3–11)
BILIRUBIN TOTAL: 0.5 mg/dL (ref 0.2–1.2)
BUN: 16 mg/dL (ref 7–26)
CO2: 27 mmol/L (ref 22–29)
CREATININE: 0.87 mg/dL (ref 0.60–1.10)
Calcium: 10 mg/dL (ref 8.4–10.4)
Chloride: 105 mmol/L (ref 98–109)
GFR calc Af Amer: >60 mL/min (ref >60)
Glucose, Bld: 136 mg/dL (ref 70–140)
POTASSIUM: 4 mmol/L (ref 3.3–4.7)
Sodium: 143 mmol/L (ref 136–145)
TOTAL PROTEIN: 7.2 g/dL (ref 6.4–8.3)

## 2017-06-30 LAB — PROTIME-INR
INR: 1.95
PROTHROMBIN TIME: 22.1 s — AB (ref 11.4–15.2)

## 2017-06-30 MED ORDER — DENOSUMAB 120 MG/1.7ML ~~LOC~~ SOLN
120.0000 mg | Freq: Once | SUBCUTANEOUS | Status: AC
  Administered 2017-06-30: 120 mg via SUBCUTANEOUS

## 2017-06-30 NOTE — Progress Notes (Signed)
Patient Care Team: Kelton Pillar, MD as PCP - General (Family Medicine) Christene Slates, MD as Physician Assistant (Radiology) Brien Few, MD as Consulting Physician (Obstetrics and Gynecology) Lafayette Dragon, MD (Inactive) as Consulting Physician (Gastroenterology) Marcy Panning, MD as Consulting Physician (Oncology) Alphonsa Overall, MD as Consulting Physician (General Surgery) Eppie Gibson, MD as Consulting Physician (Radiation Oncology) Alphonsa Overall, MD as Consulting Physician (General Surgery) Annia Belt, MD as Consulting Physician (Oncology) Nicholas Lose, MD as Consulting Physician (Hematology and Oncology) Irene Limbo, MD as Consulting Physician (Plastic Surgery)  DIAGNOSIS:  Encounter Diagnosis  Name Primary?  . Malignant neoplasm of lower-outer quadrant of right breast of female, estrogen receptor positive ([place])     SUMMARY OF ONCOLOGIC HISTORY:   Breast cancer of lower-outer quadrant of right female breast ([place])   07/31/2013 Surgery    Right lumpectomy: Invasive lobular cancer, LVID present, BMI present, margins negative, right arm invasive malignant melanoma, 0/4 lymph nodes negative, grade 2, 1.8 cm, T1c N0 stage IA ER 100%, PR 8%, HER-2 negative ratio 1, Ki-67 10%      09/24/2013 - 10/17/2013 Radiation Therapy    Adjuvant radiation therapy 42.56 Gy in 16 fractions      12/03/2013 - 09/24/2014 Anti-estrogen oral therapy    Adjuvant tamoxifen 20 mg daily 5 years      10/10/2014 - 10/26/2014 Hospital Admission    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      06/09/2016 Imaging    MRI Abdomen: No pancreatic mass; enhancement of T 12 vertebral body, enh lesions L2 and L4 vert bodies, susp of bone mets, diffuse hep steatosis      06/28/2016 PET scan    T12 and L2 Vert bodies are hypermetabolic sugg of metastatic disease. L4 vert body doesnot have hypermet activitiy. Two other  faint activity along Ant sup iliac crest and Rt scapular tip (?inflammatory).      07/22/2016 -  Anti-estrogen oral therapy    Letrozole 2.5 mg daily      12/23/2016 Imaging    Bone scan: No evidence of skeletal metastases. It may be because these lesions are below the inability of bone scan to detect them previously that were noted on PET scan and MRI       CHIEF COMPLIANT: Follow-up on letrozole therapy  INTERVAL HISTORY: [name] is a 77 year old with above-mentioned history of right breast cancer treated with lumpectomy followed by radiation and is currently on antiestrogen therapy with letrozole.  She was diagnosed with bone metastases.  She receives [place] every 3 months.  She appears to be tolerating letrozole very well.    REVIEW OF SYSTEMS:   Constitutional: Denies fevers, chills or abnormal weight loss Eyes: Denies blurriness of vision Ears, nose, mouth, throat, and face: Denies mucositis or sore throat Respiratory: Denies cough, dyspnea or wheezes Cardiovascular: Denies palpitation, chest discomfort Gastrointestinal:  Denies nausea, heartburn or change in bowel habits Skin: Denies abnormal skin rashes Lymphatics: Denies new lymphadenopathy or easy bruising Neurological:Denies numbness, tingling or new weaknesses Behavioral/Psych: Mood is stable, no new changes  Extremities: No lower extremity edema  All other systems were reviewed with the patient and are negative.  I have reviewed the past medical history, past surgical history, social history and family history with the patient and they are unchanged from previous note.  ALLERGIES:  is allergic to no known allergies.  MEDICATIONS:  Current Outpatient Medications  Medication Sig Dispense Refill  . acetaminophen (  TYLENOL) 325 MG tablet Take 325-650 mg by mouth every 6 (six) hours as needed (for pain.).    Marland Kitchen Ascorbic Acid (VITAMIN C PO) Take by mouth.    Marland Kitchen CALCIUM ASCORBATE PO Take by mouth.    . cetirizine  (ZYRTEC) 10 MG tablet Take 10 mg by mouth at bedtime. Allertec    . Cholecalciferol (VITAMIN D3) 5000 UNITS TABS Take 5,000 Units by mouth at bedtime.     . iron polysaccharides (NIFEREX) 150 MG capsule Take 150 mg by mouth at bedtime. 9pm    . letrozole (FEMARA) 2.5 MG tablet TAKE 1 TABLET (2.5 MG TOTAL) DAILY 90 tablet 3  . Multiple Vitamins-Minerals (MULTIVITAMIN ADULTS PO) Take by mouth.    . Probiotic Product (PROBIOTIC PO) Take 1 tablet by mouth at bedtime.    Marland Kitchen warfarin (COUMADIN) 2 MG tablet TAKE 1 TABLET DAILY 90 tablet 3   No current facility-administered medications for this visit.     PHYSICAL EXAMINATION: ECOG PERFORMANCE STATUS: 1 - Symptomatic but completely ambulatory  Vitals:   06/30/17 1438  BP: (!) 145/89  Pulse: 97  Resp: 18  Temp: 97.9 F (36.6 C)  SpO2: 97%   Filed Weights   06/30/17 1438  Weight: 202 lb 9.6 oz (91.9 kg)    GENERAL:alert, no distress and comfortable SKIN: skin color, texture, turgor are normal, no rashes or significant lesions EYES: normal, Conjunctiva are pink and non-injected, sclera clear OROPHARYNX:no exudate, no erythema and lips, buccal mucosa, and tongue normal  NECK: supple, thyroid normal size, non-tender, without nodularity LYMPH:  no palpable lymphadenopathy in the cervical, axillary or inguinal LUNGS: clear to auscultation and percussion with normal breathing effort HEART: regular rate & rhythm and no murmurs and no lower extremity edema ABDOMEN:abdomen soft, non-tender and normal bowel sounds MUSCULOSKELETAL:no cyanosis of digits and no clubbing  NEURO: alert & oriented x 3 with fluent speech, no focal motor/sensory deficits EXTREMITIES: No lower extremity edema  LABORATORY DATA:  I have reviewed the data as listed CMP Latest Ref Rng & Units 12/28/2016 10/05/2016 06/04/2016  Glucose 65 - 99 mg/dL 108(H) 97 -  BUN 6 - 20 mg/dL 12 13.5 -  Creatinine 0.44 - 1.00 mg/dL 0.76 0.8 0.70  Sodium 135 - 145 mmol/L 142 141 -    Potassium 3.5 - 5.1 mmol/L 3.5 4.2 -  Chloride 101 - 111 mmol/L 104 - -  CO2 22 - 32 mmol/L 28 26 -  Calcium 8.9 - 10.3 mg/dL 9.1 10.5(H) -  Total Protein 6.5 - 8.1 g/dL 6.9 7.0 -  Total Bilirubin 0.3 - 1.2 mg/dL 0.6 0.55 -  Alkaline Phos 38 - 126 U/L 68 103 -  AST 15 - 41 U/L 18 28 -  ALT 14 - 54 U/L 13(L) 25 -    Lab Results  Component Value Date   WBC 7.1 06/30/2017   HGB 14.8 06/30/2017   HCT 46.0 06/30/2017   MCV 91.6 06/30/2017   PLT 198 06/30/2017   NEUTROABS 5.1 06/30/2017    ASSESSMENT & PLAN:  Breast cancer of lower-outer quadrant of right female breast ([place]) 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  Current treatment: Anastrozole 1 mg daily started 07/22/2016 Anastrozole toxicities: 1. Occasional hot flashes 2. Arthralgias  Bone metastasis: Xgeva And calcium and vitamin D once every 3 months started 10/05/2016  We will continue the current therapy with antiestrogen letrozole and she'll come back once every 3  months for Xgeva injections.  We'll plan on doing a PET/CT scan in 3 months.  Continue with current therapy and return in 3 months for Xgeva.  I spent 25 minutes talking to the patient of which more than half was spent in counseling and coordination of care.  Orders Placed This Encounter  Procedures  . NM PET Image Restag (PS) Skull Base To Thigh    Please obtain the PET/CT scan from below the knee    Standing Status:   Future    Standing Expiration Date:   06/30/2018    Order Specific Question:   If indicated for the ordered procedure, I authorize the administration of a radiopharmaceutical per Radiology protocol    Answer:   Yes    Order Specific Question:   Preferred imaging location?    Answer:   [facility] [place]    Order Specific Question:   Radiology Contrast Protocol - do NOT remove file path    Answer:    [email or web address]    Order Specific Question:   Reason for Exam additional comments    Answer:   Metastatic breast cancer restaging   The patient has a good understanding of the overall plan. she agrees with it. she will call with any problems that may develop before the next visit here.   Harriette Ohara, MD 06/30/17

## 2017-06-30 NOTE — Telephone Encounter (Signed)
Scheduled appt per 1/10 los - gave patient avs and calendar with appts.

## 2017-06-30 NOTE — Patient Instructions (Signed)

## 2017-09-26 ENCOUNTER — Telehealth: Payer: Self-pay

## 2017-09-26 NOTE — Telephone Encounter (Signed)
Left VM returning patients call.  Cyndia Bent RN

## 2017-09-27 ENCOUNTER — Telehealth: Payer: Self-pay | Admitting: Adult Health

## 2017-09-27 NOTE — Telephone Encounter (Signed)
Called [facility] for peer to peer, however after 6 minutes on the phone with both Beyonca then Broadus John, it was determined that the authorization went through.  Authorization # [id number]  Wilber Bihari, NP

## 2017-09-28 ENCOUNTER — Ambulatory Visit: Payer: Medicare Other

## 2017-09-28 ENCOUNTER — Ambulatory Visit: Payer: Medicare Other | Admitting: Hematology and Oncology

## 2017-09-28 ENCOUNTER — Other Ambulatory Visit: Payer: Medicare Other

## 2017-09-29 ENCOUNTER — Encounter (HOSPITAL_COMMUNITY)
Admission: RE | Admit: 2017-09-29 | Discharge: 2017-09-29 | Disposition: A | Discharge status: Home / Self Care | Payer: Medicare Other | Source: Ambulatory Visit | Attending: Hematology and Oncology | Admitting: Hematology and Oncology

## 2017-09-29 DIAGNOSIS — Z170 Estrogen receptor positive status [ER+]: Secondary | ICD-10-CM | POA: Insufficient documentation

## 2017-09-29 DIAGNOSIS — C50511 Malignant neoplasm of lower-outer quadrant of right female breast: Secondary | ICD-10-CM | POA: Insufficient documentation

## 2017-09-29 LAB — GLUCOSE, CAPILLARY: GLUCOSE-CAPILLARY: 99 mg/dL (ref 65–99)

## 2017-09-29 MED ORDER — FLUDEOXYGLUCOSE F - 18 (FDG) INJECTION
10.5000 | Freq: Once | INTRAVENOUS | Status: AC | PRN
  Administered 2017-09-29: 10.5 via INTRAVENOUS

## 2017-10-04 ENCOUNTER — Telehealth: Payer: Self-pay | Admitting: Hematology and Oncology

## 2017-10-04 ENCOUNTER — Inpatient Hospital Stay: Payer: Medicare Other | Attending: Hematology and Oncology

## 2017-10-04 ENCOUNTER — Inpatient Hospital Stay: Payer: Medicare Other

## 2017-10-04 ENCOUNTER — Inpatient Hospital Stay (HOSPITAL_BASED_OUTPATIENT_CLINIC_OR_DEPARTMENT_OTHER): Payer: Medicare Other | Admitting: Hematology and Oncology

## 2017-10-04 VITALS — BP 140/92 | HR 97 | Temp 97.9°F | Resp 18 | Ht 65.5 in | Wt 209.3 lb

## 2017-10-04 DIAGNOSIS — Z7901 Long term (current) use of anticoagulants: Secondary | ICD-10-CM | POA: Diagnosis not present

## 2017-10-04 DIAGNOSIS — Z170 Estrogen receptor positive status [ER+]: Secondary | ICD-10-CM | POA: Insufficient documentation

## 2017-10-04 DIAGNOSIS — C7951 Secondary malignant neoplasm of bone: Secondary | ICD-10-CM

## 2017-10-04 DIAGNOSIS — C50511 Malignant neoplasm of lower-outer quadrant of right female breast: Secondary | ICD-10-CM | POA: Diagnosis not present

## 2017-10-04 DIAGNOSIS — Z79811 Long term (current) use of aromatase inhibitors: Secondary | ICD-10-CM | POA: Diagnosis not present

## 2017-10-04 LAB — CBC WITH DIFFERENTIAL/PLATELET
BASOS PCT: 1 %
Basophils Absolute: 0.1 10*3/uL (ref 0.0–0.1)
EOS ABS: 0.4 10*3/uL (ref 0.0–0.5)
EOS PCT: 6 %
HCT: 39 % (ref 34.8–46.6)
Hemoglobin: 12.4 g/dL (ref 11.6–15.9)
Lymphocytes Relative: 20 %
Lymphs Abs: 1.4 10*3/uL (ref 0.9–3.3)
MCH: 26.1 pg (ref 25.1–34.0)
MCHC: 31.7 g/dL (ref 31.5–36.0)
MCV: 82.2 fL (ref 79.5–101.0)
MONO ABS: 0.7 10*3/uL (ref 0.1–0.9)
Monocytes Relative: 9 %
Neutro Abs: 4.6 10*3/uL (ref 1.5–6.5)
Neutrophils Relative %: 64 %
Platelets: 268 10*3/uL (ref 145–400)
RBC: 4.75 MIL/uL (ref 3.70–5.45)
RDW: 14.9 % — AB (ref 11.2–14.5)
WBC: 7.1 10*3/uL (ref 3.9–10.3)

## 2017-10-04 LAB — COMPREHENSIVE METABOLIC PANEL
ALK PHOS: 68 U/L (ref 38–126)
ALT: 15 U/L (ref 14–54)
AST: 20 U/L (ref 15–41)
Albumin: 4 g/dL (ref 3.5–5.0)
Anion gap: 10 (ref 5–15)
BILIRUBIN TOTAL: 0.4 mg/dL (ref 0.3–1.2)
BUN: 13 mg/dL (ref 6–20)
CALCIUM: 9.9 mg/dL (ref 8.9–10.3)
CO2: 29 mmol/L (ref 22–32)
Chloride: 105 mmol/L (ref 101–111)
Creatinine, Ser: 0.98 mg/dL (ref 0.44–1.00)
GFR calc Af Amer: >60 mL/min (ref >60)
GFR, EST NON AFRICAN AMERICAN: 55 mL/min — AB (ref >60)
GLUCOSE: 107 mg/dL — AB (ref 65–99)
Potassium: 4.7 mmol/L (ref 3.5–5.1)
Sodium: 144 mmol/L (ref 135–145)
TOTAL PROTEIN: 6.9 g/dL (ref 6.5–8.1)

## 2017-10-04 LAB — PROTIME-INR
INR: 1.49
Prothrombin Time: 17.8 seconds — ABNORMAL HIGH (ref 11.4–15.2)

## 2017-10-04 NOTE — Assessment & Plan Note (Signed)
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  Current treatment: Anastrozole 1 mg daily started 07/22/2016  Anastrozole toxicities: 1. Occasional hot flashes 2. Arthralgias  Bone metastasis: Xgeva And calcium and vitamin D once every 3 months started 10/05/2016  PET/CT scan 12/17/6379: No hypermetabolic activity suggestive of bone metastases.  We will continue the current therapy with antiestrogen letrozole and she'll come back once every 3 months for Xgeva injections.

## 2017-10-04 NOTE — Progress Notes (Signed)
Patient Care Team: Kelton Pillar, MD as PCP - General (Family Medicine) Christene Slates, MD as Physician Assistant (Radiology) Brien Few, MD as Consulting Physician (Obstetrics and Gynecology) Lafayette Dragon, MD (Inactive) as Consulting Physician (Gastroenterology) Marcy Panning, MD as Consulting Physician (Oncology) Alphonsa Overall, MD as Consulting Physician (General Surgery) Eppie Gibson, MD as Consulting Physician (Radiation Oncology) Alphonsa Overall, MD as Consulting Physician (General Surgery) Annia Belt, MD as Consulting Physician (Oncology) Nicholas Lose, MD as Consulting Physician (Hematology and Oncology) Irene Limbo, MD as Consulting Physician (Plastic Surgery)  DIAGNOSIS:  Encounter Diagnoses  Name Primary?  . Bone metastasis ([place]) Yes  . Malignant neoplasm of lower-outer quadrant of right breast of female, estrogen receptor positive ([place])     SUMMARY OF ONCOLOGIC HISTORY:   Breast cancer of lower-outer quadrant of right female breast ([place])   07/31/2013 Surgery    Right lumpectomy: Invasive lobular cancer, LVID present, BMI present, margins negative, right arm invasive malignant melanoma, 0/4 lymph nodes negative, grade 2, 1.8 cm, T1c N0 stage IA ER 100%, PR 8%, HER-2 negative ratio 1, Ki-67 10%      09/24/2013 - 10/17/2013 Radiation Therapy    Adjuvant radiation therapy 42.56 Gy in 16 fractions      12/03/2013 - 09/24/2014 Anti-estrogen oral therapy    Adjuvant tamoxifen 20 mg daily 5 years      10/10/2014 - 10/26/2014 Hospital Admission    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      06/09/2016 Imaging    MRI Abdomen: No pancreatic mass; enhancement of T 12 vertebral body, enh lesions L2 and L4 vert bodies, susp of bone mets, diffuse hep steatosis      06/28/2016 PET scan    T12 and L2 Vert bodies are hypermetabolic sugg of metastatic disease. L4 vert body doesnot have  hypermet activitiy. Two other faint activity along Ant sup iliac crest and Rt scapular tip (?inflammatory).      07/22/2016 -  Anti-estrogen oral therapy    Letrozole 2.5 mg daily      12/23/2016 Imaging    Bone scan: No evidence of skeletal metastases. It may be because these lesions are below the inability of bone scan to detect them previously that were noted on PET scan and MRI      09/29/2017 PET scan    No evidence of any hypermetabolic activity.       CHIEF COMPLIANT: Follow-up to discuss PET/CT scan done for metastatic disease  INTERVAL HISTORY: [name] is a 77 year old with above-mentioned history of bone metastases at T12 and L2 vertebral bodies which had hypermetabolic activity along with other areas in the iliac crest and the right scapular tip.  She was never biopsied for these lesions.  These were detected on MRI of the abdomen initially in 2017.  She is currently on letrozole therapy and appears to be tolerating it very well.  This was started in February 2018.  She is also been on Xgeva every 3 months.  In July 2018 she had a bone scan which did not show any evidence of skeletal metastases.  In September 29, 2017 she underwent a PET/CT scan which once again did not show any evidence of skeletal metastases.  She is here today to discuss the results of the scans and to discuss her treatment plan.  REVIEW OF SYSTEMS:   Constitutional: Denies fevers, chills or abnormal weight loss Eyes: Denies blurriness of vision Ears, nose, mouth, throat,  and face: Denies mucositis or sore throat Respiratory: Denies cough, dyspnea or wheezes Cardiovascular: Denies palpitation, chest discomfort Gastrointestinal:  Denies nausea, heartburn or change in bowel habits Skin: Denies abnormal skin rashes Lymphatics: Denies new lymphadenopathy or easy bruising Neurological:Denies numbness, tingling or new weaknesses Behavioral/Psych: Mood is stable, no new changes  Extremities: No lower extremity  edema Breast:  denies any pain or lumps or nodules in either breasts  All other systems were reviewed with the patient and are negative.  I have reviewed the past medical history, past surgical history, social history and family history with the patient and they are unchanged from previous note.  ALLERGIES:  is allergic to no known allergies.  MEDICATIONS:  Current Outpatient Medications  Medication Sig Dispense Refill  . acetaminophen (TYLENOL) 325 MG tablet Take 325-650 mg by mouth every 6 (six) hours as needed (for pain.).    Marland Kitchen Ascorbic Acid (VITAMIN C PO) Take by mouth.    Marland Kitchen CALCIUM ASCORBATE PO Take by mouth.    . cetirizine (ZYRTEC) 10 MG tablet Take 10 mg by mouth at bedtime. Allertec    . Cholecalciferol (VITAMIN D3) 5000 UNITS TABS Take 5,000 Units by mouth at bedtime.     . iron polysaccharides (NIFEREX) 150 MG capsule Take 150 mg by mouth at bedtime. 9pm    . letrozole (FEMARA) 2.5 MG tablet TAKE 1 TABLET (2.5 MG TOTAL) DAILY 90 tablet 3  . Multiple Vitamins-Minerals (MULTIVITAMIN ADULTS PO) Take by mouth.    . Probiotic Product (PROBIOTIC PO) Take 1 tablet by mouth at bedtime.    Marland Kitchen warfarin (COUMADIN) 2 MG tablet TAKE 1 TABLET DAILY 90 tablet 3   No current facility-administered medications for this visit.     PHYSICAL EXAMINATION: ECOG PERFORMANCE STATUS: 1 - Symptomatic but completely ambulatory  Vitals:   10/04/17 1402  BP: (!) 140/92  Pulse: 97  Resp: 18  Temp: 97.9 F (36.6 C)  SpO2: 97%   Filed Weights   10/04/17 1402  Weight: 209 lb 4.8 oz (94.9 kg)    GENERAL:alert, no distress and comfortable SKIN: skin color, texture, turgor are normal, no rashes or significant lesions EYES: normal, Conjunctiva are pink and non-injected, sclera clear OROPHARYNX:no exudate, no erythema and lips, buccal mucosa, and tongue normal  NECK: supple, thyroid normal size, non-tender, without nodularity LYMPH:  no palpable lymphadenopathy in the cervical, axillary or  inguinal LUNGS: clear to auscultation and percussion with normal breathing effort HEART: regular rate & rhythm and no murmurs and no lower extremity edema ABDOMEN:abdomen soft, non-tender and normal bowel sounds MUSCULOSKELETAL:no cyanosis of digits and no clubbing  NEURO: alert & oriented x 3 with fluent speech, no focal motor/sensory deficits EXTREMITIES: No lower extremity edema BREAST: No palpable masses or nodules in either right or left breasts. No palpable axillary supraclavicular or infraclavicular adenopathy no breast tenderness or nipple discharge. (exam performed in the presence of a chaperone)  LABORATORY DATA:  I have reviewed the data as listed CMP Latest Ref Rng & Units 06/30/2017 12/28/2016 10/05/2016  Glucose 70 - 140 mg/dL 136 108(H) 97  BUN 7 - 26 mg/dL 16 12 13.5  Creatinine 0.60 - 1.10 mg/dL 0.87 0.76 0.8  Sodium 136 - 145 mmol/L 143 142 141  Potassium 3.3 - 4.7 mmol/L 4.0 3.5 4.2  Chloride 98 - 109 mmol/L 105 104 -  CO2 22 - 29 mmol/L 27 28 26   Calcium 8.4 - 10.4 mg/dL 10.0 9.1 10.5(H)  Total Protein 6.4 - 8.3 g/dL 7.2  6.9 7.0  Total Bilirubin 0.2 - 1.2 mg/dL 0.5 0.6 0.55  Alkaline Phos 40 - 150 U/L 86 68 103  AST 5 - 34 U/L 21 18 28   ALT 0 - 55 U/L 23 13(L) 25    Lab Results  Component Value Date   WBC 7.1 10/04/2017   HGB 12.4 10/04/2017   HCT 39.0 10/04/2017   MCV 82.2 10/04/2017   PLT 268 10/04/2017   NEUTROABS 4.6 10/04/2017    ASSESSMENT & PLAN:  Breast cancer of lower-outer quadrant of right female breast ([place]) 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  Current treatment: Anastrozole 1 mg daily started 07/22/2016  Anastrozole toxicities: 1. Occasional hot flashes 2. Arthralgias  Bone metastasis: Xgeva And calcium and vitamin D once every 3 months started 10/05/2016.  Patient I discussed the role of Xgeva without any PET/CT evidence of bone  metastases.  After discussing the pros and cons we decided to discontinue it at this time.  PET/CT scan 7/54/4920: No hypermetabolic activity suggestive of bone metastases. We may repeat a PET scan once a year.  Return to clinic in 6 months for follow-up.  Orders Placed This Encounter  Procedures  . CBC with Differential (Cancer Center Only)    Standing Status:   Future    Standing Expiration Date:   10/05/2018  . CMP ([facility] only)    Standing Status:   Future    Standing Expiration Date:   10/05/2018  . Protime-INR    Standing Status:   Future    Standing Expiration Date:   10/05/2018   The patient has a good understanding of the overall plan. she agrees with it. she will call with any problems that may develop before the next visit here.   Harriette Ohara, MD 10/04/17

## 2017-10-04 NOTE — Telephone Encounter (Signed)
Gave patient AVs and calendar of upcoming October appointments.  °

## 2018-03-21 ENCOUNTER — Observation Stay (HOSPITAL_COMMUNITY)
Admission: EM | Admit: 2018-03-21 | Discharge: 2018-03-22 | Disposition: A | Discharge status: Home / Self Care | Payer: Medicare Other | Attending: Internal Medicine | Admitting: Internal Medicine

## 2018-03-21 ENCOUNTER — Encounter (HOSPITAL_COMMUNITY): Payer: Self-pay | Admitting: Emergency Medicine

## 2018-03-21 ENCOUNTER — Other Ambulatory Visit: Payer: Self-pay

## 2018-03-21 DIAGNOSIS — E7800 Pure hypercholesterolemia, unspecified: Secondary | ICD-10-CM | POA: Insufficient documentation

## 2018-03-21 DIAGNOSIS — K449 Diaphragmatic hernia without obstruction or gangrene: Secondary | ICD-10-CM | POA: Insufficient documentation

## 2018-03-21 DIAGNOSIS — D509 Iron deficiency anemia, unspecified: Secondary | ICD-10-CM | POA: Diagnosis not present

## 2018-03-21 DIAGNOSIS — M797 Fibromyalgia: Secondary | ICD-10-CM | POA: Diagnosis not present

## 2018-03-21 DIAGNOSIS — Z79811 Long term (current) use of aromatase inhibitors: Secondary | ICD-10-CM | POA: Insufficient documentation

## 2018-03-21 DIAGNOSIS — Z86711 Personal history of pulmonary embolism: Secondary | ICD-10-CM | POA: Diagnosis not present

## 2018-03-21 DIAGNOSIS — E559 Vitamin D deficiency, unspecified: Secondary | ICD-10-CM | POA: Insufficient documentation

## 2018-03-21 DIAGNOSIS — D649 Anemia, unspecified: Secondary | ICD-10-CM | POA: Diagnosis present

## 2018-03-21 DIAGNOSIS — Z923 Personal history of irradiation: Secondary | ICD-10-CM | POA: Insufficient documentation

## 2018-03-21 DIAGNOSIS — I10 Essential (primary) hypertension: Secondary | ICD-10-CM | POA: Diagnosis not present

## 2018-03-21 DIAGNOSIS — Z79899 Other long term (current) drug therapy: Secondary | ICD-10-CM | POA: Diagnosis not present

## 2018-03-21 DIAGNOSIS — Z853 Personal history of malignant neoplasm of breast: Secondary | ICD-10-CM | POA: Diagnosis not present

## 2018-03-21 DIAGNOSIS — Z7901 Long term (current) use of anticoagulants: Secondary | ICD-10-CM | POA: Diagnosis not present

## 2018-03-21 LAB — CBC
HCT: 19 % — ABNORMAL LOW (ref 36.0–46.0)
Hemoglobin: 4.6 g/dL — CL (ref 12.0–15.0)
MCH: 14.6 pg — AB (ref 26.0–34.0)
MCHC: 24.2 g/dL — AB (ref 30.0–36.0)
MCV: 60.1 fL — AB (ref 78.0–100.0)
PLATELETS: 368 10*3/uL (ref 150–400)
RBC: 3.16 MIL/uL — ABNORMAL LOW (ref 3.87–5.11)
RDW: 21.6 % — AB (ref 11.5–15.5)
WBC: 5.3 10*3/uL (ref 4.0–10.5)

## 2018-03-21 LAB — COMPREHENSIVE METABOLIC PANEL
ALT: 13 U/L (ref 0–44)
AST: 16 U/L (ref 15–41)
Albumin: 3.6 g/dL (ref 3.5–5.0)
Alkaline Phosphatase: 62 U/L (ref 38–126)
Anion gap: 11 (ref 5–15)
BUN: 9 mg/dL (ref 8–23)
CHLORIDE: 104 mmol/L (ref 98–111)
CO2: 27 mmol/L (ref 22–32)
CREATININE: 0.78 mg/dL (ref 0.44–1.00)
Calcium: 9.4 mg/dL (ref 8.9–10.3)
GFR calc Af Amer: >60 mL/min (ref >60)
GFR calc non Af Amer: >60 mL/min (ref >60)
Glucose, Bld: 122 mg/dL — ABNORMAL HIGH (ref 70–99)
Potassium: 3.3 mmol/L — ABNORMAL LOW (ref 3.5–5.1)
Sodium: 142 mmol/L (ref 135–145)
Total Bilirubin: 1.3 mg/dL — ABNORMAL HIGH (ref 0.3–1.2)
Total Protein: 6.2 g/dL — ABNORMAL LOW (ref 6.5–8.1)

## 2018-03-21 LAB — PREPARE RBC (CROSSMATCH)

## 2018-03-21 LAB — PROTIME-INR
INR: 1.52
Prothrombin Time: 18.1 seconds — ABNORMAL HIGH (ref 11.4–15.2)

## 2018-03-21 MED ORDER — WARFARIN SODIUM 3 MG PO TABS
3.0000 mg | ORAL_TABLET | Freq: Once | ORAL | Status: AC
  Administered 2018-03-21: 1.5 mg via ORAL
  Filled 2018-03-21 (×2): qty 1

## 2018-03-21 MED ORDER — POLYSACCHARIDE IRON COMPLEX 150 MG PO CAPS
150.0000 mg | ORAL_CAPSULE | Freq: Every day | ORAL | Status: DC
  Administered 2018-03-21: 150 mg via ORAL
  Filled 2018-03-21: qty 1

## 2018-03-21 MED ORDER — SODIUM CHLORIDE 0.9 % IV SOLN
10.0000 mL/h | Freq: Once | INTRAVENOUS | Status: AC
  Administered 2018-03-21: 10 mL/h via INTRAVENOUS

## 2018-03-21 MED ORDER — LORATADINE 10 MG PO TABS
10.0000 mg | ORAL_TABLET | Freq: Every day | ORAL | Status: DC
  Administered 2018-03-21: 10 mg via ORAL
  Filled 2018-03-21: qty 1

## 2018-03-21 MED ORDER — LETROZOLE 2.5 MG PO TABS
2.5000 mg | ORAL_TABLET | Freq: Every day | ORAL | Status: DC
  Administered 2018-03-22: 2.5 mg via ORAL
  Filled 2018-03-21: qty 1

## 2018-03-21 MED ORDER — ACETAMINOPHEN 650 MG RE SUPP: 650.0000 mg | Freq: Four times a day (QID) | RECTAL | Status: DC | PRN

## 2018-03-21 MED ORDER — WARFARIN - PHARMACIST DOSING INPATIENT: Freq: Every day | Status: DC

## 2018-03-21 MED ORDER — ONDANSETRON HCL 4 MG PO TABS: 4.0000 mg | ORAL_TABLET | Freq: Four times a day (QID) | ORAL | Status: DC | PRN

## 2018-03-21 MED ORDER — ACETAMINOPHEN 325 MG PO TABS: 650.0000 mg | ORAL_TABLET | Freq: Four times a day (QID) | ORAL | Status: DC | PRN

## 2018-03-21 MED ORDER — ONDANSETRON HCL 4 MG/2ML IJ SOLN: 4.0000 mg | Freq: Four times a day (QID) | INTRAMUSCULAR | Status: DC | PRN

## 2018-03-21 NOTE — Progress Notes (Signed)
ANTICOAGULATION CONSULT NOTE - Initial Consult  Pharmacy Consult for warfarin Indication: pulmonary embolus  Allergies  Allergen Reactions  . No Known Allergies     Patient Measurements: Height: 5\' 5"  (165.1 cm) Weight: 202 lb (91.6 kg) IBW/kg (Calculated) : 57   Vital Signs: Temp: 98.7 F (37.1 C) (10/01 1516) Temp Source: Oral (10/01 1516) BP: 154/63 (10/01 1515) Pulse Rate: 103 (10/01 1515)  Labs: Recent Labs    03/21/18 1307  HGB 4.6*  HCT 19.0*  PLT 368  CREATININE 0.78    Estimated Creatinine Clearance: 66.9 mL/min (by C-G formula based on SCr of 0.78 mg/dL).   Medical History: Past Medical History:  Diagnosis Date  . Anemia   . Breast cancer ([place])    Right Breast -invasive mammary carcinoma consistent with a lobular phenotype/ Upper Inner Quadrant    . Complication of anesthesia 2009   nausea  . Diverticulosis   . Dyspnea   . Fibromyalgia   . GERD (gastroesophageal reflux disease)   . Hiatal hernia   . History of pulmonary embolism 09/27/14  . Hypercholesterolemia   . Hypertension    no longer takes meds for this  . Iron deficiency anemia   . PONV (postoperative nausea and vomiting)    put scope patch on-still got sick last surgery  . S/P radiation therapy 09/24/2013-10/17/2013   Right breast / 42.56 Gy in 16 fractions  . Use of tamoxifen (Nolvadex)    ????  . Vitamin D deficiency   . Wears contact lenses     Assessment: Pt is 52 YOF presenting with Hgb of 4.6 with h/o metastatic breast cancer and PE on daily warfarin. Pt has no signs of bleeding, found to have anemia, verified with MD safe to continue warfarin. Pt transfused with 3 units PRBC, INR is 1.5.  Goal of Therapy:  INR 2-3 Monitor platelets by anticoagulation protocol: Yes   Plan:  Warfarin 3 mg x1 Monitor daily INR Monitor for s/sx of bleeding  Madgeline Rayo J Rahcel Shutes 03/21/2018,3:30 PM

## 2018-03-21 NOTE — ED Triage Notes (Signed)
Onset 3 weeks ago develop general weakness seen doctor yesterday blood work resulted today Hgb low. Patient alert answering and following commands appropriate.

## 2018-03-21 NOTE — Plan of Care (Signed)
Will cont to mon 
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
## 2018-03-22 ENCOUNTER — Telehealth: Payer: Self-pay | Admitting: Hematology and Oncology

## 2018-03-22 DIAGNOSIS — D649 Anemia, unspecified: Secondary | ICD-10-CM

## 2018-03-22 LAB — TYPE AND SCREEN
ABO/RH(D): O POS
Antibody Screen: NEGATIVE
UNIT DIVISION: 0
UNIT DIVISION: 0
Unit division: 0

## 2018-03-22 LAB — BASIC METABOLIC PANEL
ANION GAP: 9 (ref 5–15)
BUN: 9 mg/dL (ref 8–23)
CALCIUM: 8.9 mg/dL (ref 8.9–10.3)
CO2: 25 mmol/L (ref 22–32)
Chloride: 106 mmol/L (ref 98–111)
Creatinine, Ser: 0.76 mg/dL (ref 0.44–1.00)
GFR calc Af Amer: >60 mL/min (ref >60)
GFR calc non Af Amer: >60 mL/min (ref >60)
Glucose, Bld: 98 mg/dL (ref 70–99)
Potassium: 3.1 mmol/L — ABNORMAL LOW (ref 3.5–5.1)
SODIUM: 140 mmol/L (ref 135–145)

## 2018-03-22 LAB — FIBRINOGEN: Fibrinogen: 433 mg/dL (ref 210–475)

## 2018-03-22 LAB — VITAMIN B12: Vitamin B-12: 432 pg/mL (ref 180–914)

## 2018-03-22 LAB — BPAM RBC
Blood Product Expiration Date: 201910262359
Blood Product Expiration Date: 201910262359
Blood Product Expiration Date: 201910262359
ISSUE DATE / TIME: 201910011450
ISSUE DATE / TIME: 201910011745
ISSUE DATE / TIME: 201910012035
UNIT TYPE AND RH: 5100
UNIT TYPE AND RH: 5100
Unit Type and Rh: 5100

## 2018-03-22 LAB — CBC
HCT: 28.3 % — ABNORMAL LOW (ref 36.0–46.0)
HEMATOCRIT: 29.6 % — AB (ref 36.0–46.0)
HEMOGLOBIN: 8.5 g/dL — AB (ref 12.0–15.0)
Hemoglobin: 8.8 g/dL — ABNORMAL LOW (ref 12.0–15.0)
MCH: 20.8 pg — ABNORMAL LOW (ref 26.0–34.0)
MCH: 20.9 pg — AB (ref 26.0–34.0)
MCHC: 30 g/dL (ref 30.0–36.0)
MCHC: 29.7 g/dL — AB (ref 30.0–36.0)
MCV: 69.4 fL — ABNORMAL LOW (ref 78.0–100.0)
MCV: 70.1 fL — AB (ref 78.0–100.0)
Platelets: 275 10*3/uL (ref 150–400)
Platelets: 288 10*3/uL (ref 150–400)
RBC: 4.08 MIL/uL (ref 3.87–5.11)
RBC: 4.22 MIL/uL (ref 3.87–5.11)
RDW: 28 % — AB (ref 11.5–15.5)
RDW: 28.1 % — AB (ref 11.5–15.5)
WBC: 5.6 10*3/uL (ref 4.0–10.5)
WBC: 6.6 10*3/uL (ref 4.0–10.5)

## 2018-03-22 LAB — IRON AND TIBC
Iron: 21 ug/dL — ABNORMAL LOW (ref 28–170)
Saturation Ratios: 5 % — ABNORMAL LOW (ref 10.4–31.8)
TIBC: 452 ug/dL — ABNORMAL HIGH (ref 250–450)
UIBC: 431 ug/dL

## 2018-03-22 LAB — FERRITIN: Ferritin: 6 ng/mL — ABNORMAL LOW (ref 11–307)

## 2018-03-22 LAB — PROTIME-INR
INR: 1.58
Prothrombin Time: 18.7 seconds — ABNORMAL HIGH (ref 11.4–15.2)

## 2018-03-22 LAB — FOLATE: FOLATE: 19.4 ng/mL (ref >5.9)

## 2018-03-22 MED ORDER — WARFARIN SODIUM 2 MG PO TABS
2.0000 mg | ORAL_TABLET | Freq: Once | ORAL | Status: DC
  Filled 2018-03-22: qty 1

## 2018-03-22 MED ORDER — POTASSIUM CHLORIDE CRYS ER 20 MEQ PO TBCR
40.0000 meq | EXTENDED_RELEASE_TABLET | Freq: Once | ORAL | Status: AC
  Administered 2018-03-22: 40 meq via ORAL
  Filled 2018-03-22: qty 2

## 2018-03-22 NOTE — Progress Notes (Signed)
ANTICOAGULATION CONSULT NOTE - Initial Consult  Pharmacy Consult for warfarin Indication: h/o  pulmonary embolus  Allergies  Allergen Reactions  . No Known Allergies     Patient Measurements: Height: 5\' 5"  (165.1 cm) Weight: 202 lb (91.6 kg) IBW/kg (Calculated) : 57   Vital Signs: Temp: 97.7 F (36.5 C) (10/02 0613) Temp Source: Oral (10/02 0613) BP: 149/76 (10/02 5732) Pulse Rate: 88 (10/02 0613)  Labs: Recent Labs    03/21/18 1307 03/21/18 1500 03/22/18 0602  HGB 4.6*  --  8.5*  HCT 19.0*  --  28.3*  PLT 368  --  275  LABPROT  --  18.1* 18.7*  INR  --  1.52 1.58  CREATININE 0.78  --   --     Estimated Creatinine Clearance: 66.9 mL/min (by C-G formula based on SCr of 0.78 mg/dL).   Medical History: Past Medical History:  Diagnosis Date  . Anemia   . 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 Complication of anesthesia 2009   nausea  . Diverticulosis   . Dyspnea   . Fibromyalgia   . GERD (gastroesophageal reflux disease)   . Hiatal hernia   . History of pulmonary embolism 09/27/14  . Hypercholesterolemia   . Hypertension    no longer takes meds for this  . Iron deficiency anemia   . PONV (postoperative nausea and vomiting)    put scope patch on-still got sick last surgery  . S/P radiation therapy 09/24/2013-10/17/2013   Right breast / 42.56 Gy in 16 fractions  . Use of tamoxifen (Nolvadex)    ????  . Vitamin D deficiency   . Wears contact lenses     Assessment: Pt is 36 YOF presenting with Hgb of 4.6 with h/o metastatic breast cancer and PE on daily warfarin. Pt has no signs of bleeding, found to have anemia, verified with MD safe to continue warfarin. Pt transfused with 3 units PRBC, INR is 1.5. PTA dose of warfarin 1mg  alternating with 2mg  daily.   Patient ordered 3mg  of warfarin due to subtherapeutic INR. Patient refused whole dose and agreed to take 1/2 that  amount. INR 1.58 this AM.   Goal of Therapy:  INR 2-3 Monitor platelets by anticoagulation protocol: Yes   Plan:  Warfarin 2 mg x1 Monitor daily INR Monitor for s/sx of bleeding  Aydian Dimmick A. Levada Dy, PharmD, [place] Pager: [phone] Please utilize Amion for appropriate phone number to reach the unit pharmacist ([place])   03/22/2018,8:24 AM

## 2018-03-22 NOTE — Telephone Encounter (Signed)
Scheduled appt per 10/2 sch message - pt is aware of apts

## 2018-03-22 NOTE — Discharge Summary (Signed)
[facility] Discharge Summary   Patient: [name] ZOX:[id number]   PCP: Kelton Pillar, MD DOB: 05-12-1941   Date of admission: 03/21/2018   Date of discharge: 03/22/2018     Discharge Diagnoses:  Principal Problem:   Symptomatic anemia Active Problems:   History of PE 09/27/14   Hypercholesterolemia   Essential hypertension   Admitted From: home Disposition:  home  Recommendations for Outpatient Follow-up:  1. Please follow-up with PCP in 1 week, hematology in 1 week.  Please establish care with GI.  Follow-up Information    Kelton Pillar, MD. Schedule an appointment as soon as possible for a visit in 1 week(s).   Specialty:  Family Medicine Contact information: [address] [place] [address] [phone]        Nicholas Lose, MD Follow up.   Specialty:  Hematology and Oncology Contact information: [place] [address] [facility]. Schedule an appointment as soon as possible for a visit in 1 month(s).   Specialty:  Gastroenterology Contact information: [address] [place] [address] [phone]         Diet recommendation: cardiac diet  Activity: The patient is advised to gradually reintroduce usual activities.  Discharge Condition: good  Code Status: full code  History of present illness: As per the H and P dictated on admission, "[name] is a 77 y.o. female with medical history significant of breast cancer s/p radiation therapy (2015); HTN; HLD; PE (2016); and fibromyalgia presenting with fatigue. She went to her PCP yesterday, thought she was having problems with her Coumadin but also high blood pressure.  They found that her anemia is back.  Her PCP called her today and sent her here.  She was diagnosed with anemia >5 years.  She has iron infusions and was transfused during her last hospital stay.  +DOE - h/o PE twice.   +fatigue "forever", worse x 4-5 weeks.  Her husband has been having in-home PT due to Parkinson's and the therapist checked her pulse at rest and it was 99.  She has "pus" in her stools for the last few weeks - thinks this is related to Activia.  She occasionally has hemorrhoidal bleeding but otherwise denies blood in stools.  No vaginal bleeding.  She was previously diagnosed with iron deficiency anemia. "  Hospital Course:  Summary of her active problems in the hospital is as following. Principal Problem:   Symptomatic anemia History of iron deficiency anemia. Patient denies any active bleeding no melena no black-colored bowel movement, no maroon or no bright red blood per back rectum as well. No vomiting.  No back pain. Her a repeat H&H x2 remained stable. With this I feel that the patient can safely be discharged home. Recommend outpatient GI work-up. Recommend follow-up with hematology in 1 week for further treatment for iron deficiency anemia including IV iron. Continue oral iron on discharge for now. Already follows up with hematology for breast cancer.  Active Problems:   History of PE 09/27/14 On Coumadin at home. INR is subtherapeutic. For now we will resume home regimen.    Hypercholesterolemia Continue home regimen.    Essential hypertension Not on any medication continue home regimen.  Recent diarrhea. Patient had recent diarrhea.  Denies any black-colored bowel movement or bleeding. Recommend outpatient GI follow-up for further work-up for iron deficiency anemia.   All other chronic medical condition were stable during the hospitalization.  Patient was ambulatory without any assistance. On the day of the discharge the patient's vitals were stable , and no other acute medical condition were reported by patient. the patient was felt safe to be discharge at home with family.  Consultants: none Procedures: none  DISCHARGE MEDICATION: Allergies as of 03/22/2018       Reactions   No Known Allergies       Medication List    TAKE these medications   acetaminophen 325 MG tablet Commonly known as:  TYLENOL Take 325-650 mg by mouth every 6 (six) hours as needed (for pain.).   CALCIUM ASCORBATE PO Take by mouth.   cetirizine 10 MG tablet Commonly known as:  ZYRTEC Take 10 mg by mouth at bedtime. Allertec   iron polysaccharides 150 MG capsule Commonly known as:  NIFEREX Take 150 mg by mouth at bedtime. 9pm   letrozole 2.5 MG tablet Commonly known as:  FEMARA TAKE 1 TABLET (2.5 MG TOTAL) DAILY What changed:  See the new instructions.   MULTIVITAMIN ADULTS PO Take by mouth.   PROBIOTIC PO Take 1 tablet by mouth at bedtime.   VITAMIN C PO Take by mouth.   Vitamin D3 5000 units Tabs Take 5,000 Units by mouth at bedtime.   warfarin 2 MG tablet Commonly known as:  COUMADIN Take as directed. If you are unsure how to take this medication, talk to your nurse or doctor. Original instructions:  TAKE 1 TABLET DAILY      Allergies  Allergen Reactions  . No Known Allergies    Discharge Instructions    Ambulatory referral to Gastroenterology   Complete by:  As directed    Pt with iron deficiency anemia, known to Dr Olevia Perches in past.   What is the reason for referral?:  Other   Diet - low sodium heart healthy   Complete by:  As directed    Discharge instructions   Complete by:  As directed    It is important that you read following instructions as well as go over your medication list with RN to help you understand your care after this hospitalization.  Discharge Instructions: Please follow-up with PCP in one week  Please request your primary care physician to go over all Hospital Tests and Procedure/Radiological results at the follow up,  Please get all Hospital records sent to your PCP by signing hospital release before you go home.   Do not take more than prescribed Pain, Sleep and Anxiety Medications. You were cared for by a  hospitalist during your hospital stay. If you have any questions about your discharge medications or the care you received while you were in the hospital after you are discharged, you can call the unit and ask to speak with the hospitalist on call if the hospitalist that took care of you is not available.  Once you are discharged, your primary care physician will handle any further medical issues. Please note that NO REFILLS for any discharge medications will be authorized once you are discharged, as it is imperative that you return to your primary care physician (or establish a relationship with a primary care physician if you do not have one) for your aftercare needs so that they can reassess your need for medications and monitor your lab values. You Must read complete instructions/literature along with all the possible adverse reactions/side effects for all the Medicines you take and that have been prescribed to you. Take any new Medicines after you have completely understood and accept  all the possible adverse reactions/side effects. Wear Seat belts while driving. If you have smoked or chewed Tobacco in the last 2 yrs please stop smoking and/or stop any Recreational drug use.   Increase activity slowly   Complete by:  As directed      Discharge Exam: Filed Weights   03/21/18 1239  Weight: 91.6 kg   Vitals:   03/22/18 1240 03/22/18 1408  BP: 135/61 (!) 145/60  Pulse: 81 89  Resp: 14 15  Temp: 98.5 F (36.9 C) 98.1 F (36.7 C)  SpO2:     General: Appear in no distress, no Rash; Oral Mucosa moist. Cardiovascular: S1 and S2 Present, no Murmur, no JVD Respiratory: Bilateral Air entry present and Clear to Auscultation, no Crackles, no wheezes Abdomen: Bowel Sound prensent, Soft and on tenderness Extremities: ono Pedal edema, no calf tenderness Neurology: Grossly no focal neuro deficit.  The results of significant diagnostics from this hospitalization (including imaging, microbiology,  ancillary and laboratory) are listed below for reference.    Significant Diagnostic Studies: No results found.  Microbiology: No results found for this or any previous visit (from the past 240 hour(s)).   Labs: CBC: Recent Labs  Lab 03/21/18 1307 03/22/18 0602 03/22/18 1130  WBC 5.3 5.6 6.6  HGB 4.6* 8.5* 8.8*  HCT 19.0* 28.3* 29.6*  MCV 60.1* 69.4* 70.1*  PLT 368 275 865   Basic Metabolic Panel: Recent Labs  Lab 03/21/18 1307 03/22/18 0602  NA 142 140  K 3.3* 3.1*  CL 104 106  CO2 27 25  GLUCOSE 122* 98  BUN 9 9  CREATININE 0.78 0.76  CALCIUM 9.4 8.9   Liver Function Tests: Recent Labs  Lab 03/21/18 1307  AST 16  ALT 13  ALKPHOS 62  BILITOT 1.3*  PROT 6.2*  ALBUMIN 3.6   No results for input(s): LIPASE, AMYLASE in the last 168 hours. No results for input(s): AMMONIA in the last 168 hours. Cardiac Enzymes: No results for input(s): CKTOTAL, CKMB, CKMBINDEX, TROPONINI in the last 168 hours. BNP (last 3 results) No results for input(s): BNP in the last 8760 hours. CBG: No results for input(s): GLUCAP in the last 168 hours. Time spent: 35 minutes  Signed:  Berle Mull  [facility] 03/22/2018 , 5:57 PM

## 2018-04-03 ENCOUNTER — Inpatient Hospital Stay: Payer: Medicare Other | Attending: Hematology and Oncology

## 2018-04-03 DIAGNOSIS — C7951 Secondary malignant neoplasm of bone: Secondary | ICD-10-CM

## 2018-04-03 DIAGNOSIS — Z170 Estrogen receptor positive status [ER+]: Secondary | ICD-10-CM

## 2018-04-03 DIAGNOSIS — D509 Iron deficiency anemia, unspecified: Secondary | ICD-10-CM | POA: Insufficient documentation

## 2018-04-03 DIAGNOSIS — C50511 Malignant neoplasm of lower-outer quadrant of right female breast: Secondary | ICD-10-CM

## 2018-04-03 LAB — CBC WITH DIFFERENTIAL (CANCER CENTER ONLY)
Abs Immature Granulocytes: 0.02 10*3/uL (ref 0.00–0.07)
BASOS ABS: 0 10*3/uL (ref 0.0–0.1)
BASOS PCT: 1 %
Eosinophils Absolute: 0.2 10*3/uL (ref 0.0–0.5)
Eosinophils Relative: 3 %
HEMATOCRIT: 35.5 % — AB (ref 36.0–46.0)
Hemoglobin: 10 g/dL — ABNORMAL LOW (ref 12.0–15.0)
IMMATURE GRANULOCYTES: 0 %
LYMPHS ABS: 1 10*3/uL (ref 0.7–4.0)
Lymphocytes Relative: 14 %
MCH: 21.1 pg — ABNORMAL LOW (ref 26.0–34.0)
MCHC: 28.2 g/dL — ABNORMAL LOW (ref 30.0–36.0)
MCV: 74.7 fL — AB (ref 80.0–100.0)
MONOS PCT: 7 %
Monocytes Absolute: 0.5 10*3/uL (ref 0.1–1.0)
NEUTROS PCT: 75 %
NRBC: 0 % (ref 0.0–0.2)
Neutro Abs: 5.4 10*3/uL (ref 1.7–7.7)
PLATELETS: 382 10*3/uL (ref 150–400)
RBC: 4.75 MIL/uL (ref 3.87–5.11)
WBC Count: 7.2 10*3/uL (ref 4.0–10.5)

## 2018-04-03 LAB — CMP (CANCER CENTER ONLY)
ALBUMIN: 3.9 g/dL (ref 3.5–5.0)
ALT: 13 U/L (ref 0–44)
ANION GAP: 9 (ref 5–15)
AST: 19 U/L (ref 15–41)
Alkaline Phosphatase: 79 U/L (ref 38–126)
BUN: 10 mg/dL (ref 8–23)
CO2: 31 mmol/L (ref 22–32)
Calcium: 10 mg/dL (ref 8.9–10.3)
Chloride: 105 mmol/L (ref 98–111)
Creatinine: 0.94 mg/dL (ref 0.44–1.00)
GFR, Est AFR Am: >60 mL/min (ref >60)
GFR, Estimated: 57 mL/min — ABNORMAL LOW (ref >60)
GLUCOSE: 120 mg/dL — AB (ref 70–99)
POTASSIUM: 3.9 mmol/L (ref 3.5–5.1)
SODIUM: 145 mmol/L (ref 135–145)
Total Bilirubin: 0.7 mg/dL (ref 0.3–1.2)
Total Protein: 6.7 g/dL (ref 6.5–8.1)

## 2018-04-03 LAB — PROTIME-INR
INR: 1.36
PROTHROMBIN TIME: 16.6 s — AB (ref 11.4–15.2)

## 2018-04-04 ENCOUNTER — Inpatient Hospital Stay: Payer: Medicare Other

## 2018-04-04 ENCOUNTER — Other Ambulatory Visit: Payer: Medicare Other

## 2018-04-04 ENCOUNTER — Telehealth: Payer: Self-pay | Admitting: Hematology and Oncology

## 2018-04-04 ENCOUNTER — Inpatient Hospital Stay (HOSPITAL_BASED_OUTPATIENT_CLINIC_OR_DEPARTMENT_OTHER): Payer: Medicare Other | Admitting: Hematology and Oncology

## 2018-04-04 VITALS — BP 156/63 | HR 101 | Temp 98.2°F | Resp 17 | Ht 65.0 in | Wt 195.7 lb

## 2018-04-04 DIAGNOSIS — D62 Acute posthemorrhagic anemia: Secondary | ICD-10-CM

## 2018-04-04 DIAGNOSIS — Z170 Estrogen receptor positive status [ER+]: Secondary | ICD-10-CM | POA: Diagnosis not present

## 2018-04-04 DIAGNOSIS — K909 Intestinal malabsorption, unspecified: Secondary | ICD-10-CM

## 2018-04-04 DIAGNOSIS — D509 Iron deficiency anemia, unspecified: Secondary | ICD-10-CM

## 2018-04-04 DIAGNOSIS — D649 Anemia, unspecified: Secondary | ICD-10-CM

## 2018-04-04 DIAGNOSIS — Z0000 Encounter for general adult medical examination without abnormal findings: Secondary | ICD-10-CM

## 2018-04-04 DIAGNOSIS — C50511 Malignant neoplasm of lower-outer quadrant of right female breast: Secondary | ICD-10-CM | POA: Diagnosis not present

## 2018-04-04 MED ORDER — SODIUM CHLORIDE 0.9 % IV SOLN
Freq: Once | INTRAVENOUS | Status: AC
  Administered 2018-04-04: 16:00:00 via INTRAVENOUS
  Filled 2018-04-04: qty 250

## 2018-04-04 MED ORDER — SODIUM CHLORIDE 0.9 % IV SOLN
510.0000 mg | Freq: Once | INTRAVENOUS | Status: AC
  Administered 2018-04-04: 510 mg via INTRAVENOUS
  Filled 2018-04-04: qty 17

## 2018-04-04 NOTE — Assessment & Plan Note (Signed)
Patient was hospitalized with a hemoglobin of 4.6 and received 3 units of packed red cells.  She was sent home on oral iron therapy.  Patient has not been taking it because she tells me that she has known diagnosis of iron malabsorption. Previous episode of iron deficiency was in 2016.  Current treatment: IV iron therapy with Feraheme today and in 1 week  Return to clinic in 3 months with recheck of labs and follow-up.

## 2018-04-04 NOTE — Progress Notes (Signed)
Patient Care Team: Kelton Pillar, MD as PCP - General (Family Medicine) Christene Slates, MD as Physician Assistant (Radiology) Brien Few, MD as Consulting Physician (Obstetrics and Gynecology) Lafayette Dragon, MD (Inactive) as Consulting Physician (Gastroenterology) Marcy Panning, MD as Consulting Physician (Oncology) Alphonsa Overall, MD as Consulting Physician (General Surgery) Eppie Gibson, MD as Consulting Physician (Radiation Oncology) Alphonsa Overall, MD as Consulting Physician (General Surgery) Annia Belt, MD as Consulting Physician (Oncology) Nicholas Lose, MD as Consulting Physician (Hematology and Oncology) Irene Limbo, MD as Consulting Physician (Plastic Surgery)  DIAGNOSIS:  Encounter Diagnoses  Name Primary?  . Chronic anemia   . Malignant neoplasm of lower-outer quadrant of right breast of female, estrogen receptor positive ([place])   . Annual physical exam Yes  . Iron deficiency anemia, unspecified iron deficiency anemia type     SUMMARY OF ONCOLOGIC HISTORY:   Breast cancer of lower-outer quadrant of right female breast ([place])   07/31/2013 Surgery    Right lumpectomy: Invasive lobular cancer, LVID present, BMI present, margins negative, right arm invasive malignant melanoma, 0/4 lymph nodes negative, grade 2, 1.8 cm, T1c N0 stage IA ER 100%, PR 8%, HER-2 negative ratio 1, Ki-67 10%    09/24/2013 - 10/17/2013 Radiation Therapy    Adjuvant radiation therapy 42.56 Gy in 16 fractions    12/03/2013 - 09/24/2014 Anti-estrogen oral therapy    Adjuvant tamoxifen 20 mg daily 5 years    10/10/2014 - 10/26/2014 Hospital Admission    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    06/09/2016 Imaging    MRI Abdomen: No pancreatic mass; enhancement of T 12 vertebral body, enh lesions L2 and L4 vert bodies, susp of bone mets, diffuse hep steatosis    06/28/2016 PET scan    T12 and L2 Vert bodies  are hypermetabolic sugg of metastatic disease. L4 vert body doesnot have hypermet activitiy. Two other faint activity along Ant sup iliac crest and Rt scapular tip (?inflammatory).    07/22/2016 -  Anti-estrogen oral therapy    Letrozole 2.5 mg daily    12/23/2016 Imaging    Bone scan: No evidence of skeletal metastases. It may be because these lesions are below the inability of bone scan to detect them previously that were noted on PET scan and MRI    09/29/2017 PET scan    No evidence of any hypermetabolic activity.     CHIEF COMPLIANT: Follow-up to discuss recent hospitalization for iron deficiency anemia required 3 units of blood transfusion  INTERVAL HISTORY: [name] is a 31-year with above-mentioned history of severe iron deficiency anemia required hospitalization and blood transfusions who is here to receive her first iron infusion.  She tells me that she has had similar episode 3 years ago that her hemoglobin went down to 6 g.  A complete endoscopy upper lower evaluation did not reveal any particular bleeding etiology.  She does have hemorrhoids but she has not noticed any increased bleeding.  She was told previously that she has malabsorption.  She was tested for celiac disease and was negative.  REVIEW OF SYSTEMS:   Constitutional: Denies fevers, chills or abnormal weight loss Eyes: Denies blurriness of vision Ears, nose, mouth, throat, and face: Denies mucositis or sore throat Respiratory: Denies cough, dyspnea or wheezes Cardiovascular: Denies palpitation, chest discomfort Gastrointestinal:  Denies nausea, heartburn or change in bowel habits Skin: Denies abnormal skin rashes Lymphatics: Denies new lymphadenopathy or easy bruising Neurological:Denies numbness, tingling or new  weaknesses Behavioral/Psych: Mood is stable, no new changes  Extremities: No lower extremity edema   All other systems were reviewed with the patient and are negative.  I have reviewed the past  medical history, past surgical history, social history and family history with the patient and they are unchanged from previous note.  ALLERGIES:  is allergic to no known allergies.  MEDICATIONS:  Current Outpatient Medications  Medication Sig Dispense Refill  . acetaminophen (TYLENOL) 325 MG tablet Take 325-650 mg by mouth every 6 (six) hours as needed (for pain.).    Marland Kitchen Ascorbic Acid (VITAMIN C PO) Take by mouth.    Marland Kitchen CALCIUM ASCORBATE PO Take by mouth.    . cetirizine (ZYRTEC) 10 MG tablet Take 10 mg by mouth at bedtime. Allertec    . Cholecalciferol (VITAMIN D3) 5000 UNITS TABS Take 5,000 Units by mouth at bedtime.     . iron polysaccharides (NIFEREX) 150 MG capsule Take 150 mg by mouth at bedtime. 9pm    . letrozole (FEMARA) 2.5 MG tablet TAKE 1 TABLET (2.5 MG TOTAL) DAILY (Patient taking differently: Take 2.5 mg by mouth daily. ) 90 tablet 3  . Multiple Vitamins-Minerals (MULTIVITAMIN ADULTS PO) Take by mouth.    . Probiotic Product (PROBIOTIC PO) Take 1 tablet by mouth at bedtime.    Marland Kitchen warfarin (COUMADIN) 2 MG tablet TAKE 1 TABLET DAILY 90 tablet 3   No current facility-administered medications for this visit.    Facility-Administered Medications Ordered in Other Visits  Medication Dose Route Frequency Provider Last Rate Last Dose  . ferumoxytol (FERAHEME) 510 mg in sodium chloride 0.9 % 100 mL IVPB  510 mg Intravenous Once Nicholas Lose, MD        PHYSICAL EXAMINATION: ECOG PERFORMANCE STATUS: 1 - Symptomatic but completely ambulatory  Vitals:   04/04/18 1359  BP: (!) 156/63  Pulse: (!) 101  Resp: 17  Temp: 98.2 F (36.8 C)  SpO2: 99%   Filed Weights   04/04/18 1359  Weight: 195 lb 11.2 oz (88.8 kg)    GENERAL:alert, no distress and comfortable SKIN: skin color, texture, turgor are normal, no rashes or significant lesions EYES: normal, Conjunctiva are pink and non-injected, sclera clear OROPHARYNX:no exudate, no erythema and lips, buccal mucosa, and tongue normal    NECK: supple, thyroid normal size, non-tender, without nodularity LYMPH:  no palpable lymphadenopathy in the cervical, axillary or inguinal LUNGS: clear to auscultation and percussion with normal breathing effort HEART: regular rate & rhythm and no murmurs and no lower extremity edema ABDOMEN:abdomen soft, non-tender and normal bowel sounds MUSCULOSKELETAL:no cyanosis of digits and no clubbing  NEURO: alert & oriented x 3 with fluent speech, no focal motor/sensory deficits EXTREMITIES: No lower extremity edema   LABORATORY DATA:  I have reviewed the data as listed CMP Latest Ref Rng & Units 04/03/2018 03/22/2018 03/21/2018  Glucose 70 - 99 mg/dL 120(H) 98 122(H)  BUN 8 - 23 mg/dL 10 9 9   Creatinine 0.44 - 1.00 mg/dL 0.94 0.76 0.78  Sodium 135 - 145 mmol/L 145 140 142  Potassium 3.5 - 5.1 mmol/L 3.9 3.1(L) 3.3(L)  Chloride 98 - 111 mmol/L 105 106 104  CO2 22 - 32 mmol/L 31 25 27   Calcium 8.9 - 10.3 mg/dL 10.0 8.9 9.4  Total Protein 6.5 - 8.1 g/dL 6.7 - 6.2(L)  Total Bilirubin 0.3 - 1.2 mg/dL 0.7 - 1.3(H)  Alkaline Phos 38 - 126 U/L 79 - 62  AST 15 - 41 U/L 19 - 16  ALT 0 - 44 U/L 13 - 13    Lab Results  Component Value Date   WBC 7.2 04/03/2018   HGB 10.0 (L) 04/03/2018   HCT 35.5 (L) 04/03/2018   MCV 74.7 (L) 04/03/2018   PLT 382 04/03/2018   NEUTROABS 5.4 04/03/2018    ASSESSMENT & PLAN:  Chronic anemia Hospitalization 03/21/2018 for symptomatic anemia required blood transfusion and iron infusion Hemoglobin went down to 4.6 with ferritin of 6 and iron saturation of 5% Yesterday's hemoglobin was 10 Severe microcytic anemia suggestive of iron deficiency.  Recommendation: Recheck iron levels to see if she needs more IV iron infusion. Patient will need GI work-up for the severe anemia of iron deficiency.  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  Current treatment: Anastrozole 1 mg daily started 07/22/2016  Anastrozole toxicities: 1. Occasional hot flashes 2. Arthralgias  Bone metastasis: I recommended Xgeva and calcium and vitamin D.  Patient decided that she does not want to take Xgeva.  PET/CT scan 09/27/8117: No hypermetabolic activity suggestive of bone metastases. We may repeat a PET scan once a year.     Iron deficiency anemia Patient was hospitalized with a hemoglobin of 4.6 and received 3 units of packed red cells.  She was sent home on oral iron therapy.  Patient has not been taking it because she tells me that she has known diagnosis of iron malabsorption. Previous episode of iron deficiency was in 2016.  Current treatment: IV iron therapy with Feraheme today and in 1 week  Return to clinic in 3 months with recheck of labs and follow-up.    Orders Placed This Encounter  Procedures  . Ferritin    Standing Status:   Future    Standing Expiration Date:   04/04/2019  . Iron and TIBC    Standing Status:   Future    Standing Expiration Date:   04/04/2019  . CBC with Differential (Cancer Center Only)    Standing Status:   Future    Standing Expiration Date:   04/05/2019  . Hemoglobin A1c    Please add this to blood drawn yesterday    Standing Status:   Future    Standing Expiration Date:   04/04/2019  . VITAMIN D 25 Hydroxy (Vit-D Deficiency, Fractures)    Please add this to blood drawn yesterday    Standing Status:   Future    Standing Expiration Date:   05/09/2019  . Ferritin    Standing Status:   Future    Standing Expiration Date:   04/04/2019  . Iron and TIBC    Standing Status:   Future    Standing Expiration Date:   04/04/2019  . CBC with Differential (Cancer Center Only)    Standing Status:   Future    Standing Expiration Date:   04/05/2019   The patient has a good understanding of the overall plan. she agrees with it. she will call with any  problems that may develop before the next visit here.   Harriette Ohara, MD 04/04/18

## 2018-04-04 NOTE — Telephone Encounter (Signed)
Gave avs and calendar ° °

## 2018-04-04 NOTE — Assessment & Plan Note (Signed)
Hospitalization 03/21/2018 for symptomatic anemia required blood transfusion and iron infusion Hemoglobin went down to 4.6 with ferritin of 6 and iron saturation of 5% Yesterday's hemoglobin was 10 Severe microcytic anemia suggestive of iron deficiency.  Recommendation: Recheck iron levels to see if she needs more IV iron infusion. Patient will need GI work-up for the severe anemia of iron deficiency.

## 2018-04-04 NOTE — Assessment & Plan Note (Signed)
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  Current treatment: Anastrozole 1 mg daily started 07/22/2016  Anastrozole toxicities: 1. Occasional hot flashes 2. Arthralgias  Bone metastasis: I recommended Xgeva and calcium and vitamin D.  Patient decided that she does not want to take Xgeva.  PET/CT scan 6/55/3748: No hypermetabolic activity suggestive of bone metastases. We may repeat a PET scan once a year.  Return to clinic in 6 months with scans and follow-up

## 2018-04-04 NOTE — Patient Instructions (Signed)

## 2018-04-12 ENCOUNTER — Inpatient Hospital Stay: Payer: Medicare Other

## 2018-04-12 VITALS — BP 131/81 | HR 74 | Temp 97.6°F | Resp 17

## 2018-04-12 DIAGNOSIS — D62 Acute posthemorrhagic anemia: Secondary | ICD-10-CM

## 2018-04-12 DIAGNOSIS — D509 Iron deficiency anemia, unspecified: Secondary | ICD-10-CM | POA: Diagnosis not present

## 2018-04-12 MED ORDER — SODIUM CHLORIDE 0.9 % IV SOLN
510.0000 mg | Freq: Once | INTRAVENOUS | Status: AC
  Administered 2018-04-12: 510 mg via INTRAVENOUS
  Filled 2018-04-12: qty 17

## 2018-04-12 MED ORDER — SODIUM CHLORIDE 0.9 % IV SOLN
Freq: Once | INTRAVENOUS | Status: AC
  Administered 2018-04-12: 16:00:00 via INTRAVENOUS
  Filled 2018-04-12: qty 250

## 2018-04-12 NOTE — Patient Instructions (Signed)

## 2018-04-12 NOTE — Progress Notes (Signed)
Patient declined to stay for post-observation period. Patient denies concerns or complaints on exit.

## 2018-04-27 ENCOUNTER — Encounter: Payer: Self-pay | Admitting: Internal Medicine

## 2018-04-27 ENCOUNTER — Ambulatory Visit (INDEPENDENT_AMBULATORY_CARE_PROVIDER_SITE_OTHER): Payer: Medicare Other | Admitting: Internal Medicine

## 2018-04-27 VITALS — BP 140/88 | HR 96 | Ht 63.5 in | Wt 189.4 lb

## 2018-04-27 DIAGNOSIS — D508 Other iron deficiency anemias: Secondary | ICD-10-CM

## 2018-04-27 DIAGNOSIS — K449 Diaphragmatic hernia without obstruction or gangrene: Secondary | ICD-10-CM

## 2018-04-27 NOTE — Patient Instructions (Signed)
  Follow up with Dr Lindi Adie and come back and see [place] as needed.    I appreciate the opportunity to care for you[place], [id number] [facility]
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
## 2018-04-30 ENCOUNTER — Encounter: Payer: Self-pay | Admitting: Internal Medicine

## 2018-06-06 ENCOUNTER — Other Ambulatory Visit: Payer: Self-pay | Admitting: Hematology and Oncology

## 2018-06-07 ENCOUNTER — Other Ambulatory Visit: Payer: Self-pay

## 2018-06-07 MED ORDER — WARFARIN SODIUM 2 MG PO TABS: 2.0000 mg | ORAL_TABLET | Freq: Every day | ORAL | 3 refills | Status: DC

## 2018-06-07 MED ORDER — LETROZOLE 2.5 MG PO TABS: 2.5000 mg | ORAL_TABLET | Freq: Every day | ORAL | 3 refills | Status: DC

## 2018-07-05 ENCOUNTER — Inpatient Hospital Stay: Payer: Medicare Other | Attending: Hematology and Oncology

## 2018-07-05 DIAGNOSIS — K909 Intestinal malabsorption, unspecified: Secondary | ICD-10-CM | POA: Diagnosis not present

## 2018-07-05 DIAGNOSIS — D508 Other iron deficiency anemias: Secondary | ICD-10-CM | POA: Insufficient documentation

## 2018-07-05 DIAGNOSIS — Z170 Estrogen receptor positive status [ER+]: Secondary | ICD-10-CM | POA: Diagnosis not present

## 2018-07-05 DIAGNOSIS — Z79811 Long term (current) use of aromatase inhibitors: Secondary | ICD-10-CM | POA: Diagnosis not present

## 2018-07-05 DIAGNOSIS — Z79899 Other long term (current) drug therapy: Secondary | ICD-10-CM | POA: Diagnosis not present

## 2018-07-05 DIAGNOSIS — C7951 Secondary malignant neoplasm of bone: Secondary | ICD-10-CM | POA: Diagnosis not present

## 2018-07-05 DIAGNOSIS — C50511 Malignant neoplasm of lower-outer quadrant of right female breast: Secondary | ICD-10-CM | POA: Insufficient documentation

## 2018-07-05 DIAGNOSIS — Z923 Personal history of irradiation: Secondary | ICD-10-CM | POA: Diagnosis not present

## 2018-07-05 DIAGNOSIS — D649 Anemia, unspecified: Secondary | ICD-10-CM

## 2018-07-05 DIAGNOSIS — Z0000 Encounter for general adult medical examination without abnormal findings: Secondary | ICD-10-CM

## 2018-07-05 LAB — CBC WITH DIFFERENTIAL (CANCER CENTER ONLY)
ABS IMMATURE GRANULOCYTES: 0.03 10*3/uL (ref 0.00–0.07)
Basophils Absolute: 0 10*3/uL (ref 0.0–0.1)
Basophils Relative: 0 %
EOS PCT: 3 %
Eosinophils Absolute: 0.2 10*3/uL (ref 0.0–0.5)
HCT: 35.6 % — ABNORMAL LOW (ref 36.0–46.0)
Hemoglobin: 11.4 g/dL — ABNORMAL LOW (ref 12.0–15.0)
Immature Granulocytes: 0 %
Lymphocytes Relative: 14 %
Lymphs Abs: 1.1 10*3/uL (ref 0.7–4.0)
MCH: 29.4 pg (ref 26.0–34.0)
MCHC: 32 g/dL (ref 30.0–36.0)
MCV: 91.8 fL (ref 80.0–100.0)
MONO ABS: 0.6 10*3/uL (ref 0.1–1.0)
MONOS PCT: 7 %
NEUTROS ABS: 6 10*3/uL (ref 1.7–7.7)
Neutrophils Relative %: 76 %
PLATELETS: 259 10*3/uL (ref 150–400)
RBC: 3.88 MIL/uL (ref 3.87–5.11)
RDW: 15.3 % (ref 11.5–15.5)
WBC Count: 8 10*3/uL (ref 4.0–10.5)
nRBC: 0 % (ref 0.0–0.2)

## 2018-07-05 LAB — HEMOGLOBIN A1C
Hgb A1c MFr Bld: 5.2 % (ref 4.8–5.6)
Mean Plasma Glucose: 102.54 mg/dL

## 2018-07-05 NOTE — Progress Notes (Signed)
Patient Care Team: Kelton Pillar, MD as PCP - General (Family Medicine) Christene Slates, MD as Physician Assistant (Radiology) Brien Few, MD as Consulting Physician (Obstetrics and Gynecology) Lafayette Dragon, MD (Inactive) as Consulting Physician (Gastroenterology) Marcy Panning, MD as Consulting Physician (Oncology) Alphonsa Overall, MD as Consulting Physician (General Surgery) Eppie Gibson, MD as Consulting Physician (Radiation Oncology) Alphonsa Overall, MD as Consulting Physician (General Surgery) Annia Belt, MD as Consulting Physician (Oncology) Nicholas Lose, MD as Consulting Physician (Hematology and Oncology) Irene Limbo, MD as Consulting Physician (Plastic Surgery)  DIAGNOSIS:    ICD-10-CM   1. Iron deficiency anemia, unspecified iron deficiency anemia type D50.9 Ferritin    Iron and TIBC    CBC with Differential ([place])    Protime-INR  2. Malignant neoplasm of lower-outer quadrant of right breast of female, estrogen receptor positive ([place]) C50.511    Z17.0   3. Bone metastasis ([place]) C79.51     SUMMARY OF ONCOLOGIC HISTORY:   Breast cancer of lower-outer quadrant of right female breast ([place])   07/31/2013 Surgery    Right lumpectomy: Invasive lobular cancer, LVID present, BMI present, margins negative, right arm invasive malignant melanoma, 0/4 lymph nodes negative, grade 2, 1.8 cm, T1c N0 stage IA ER 100%, PR 8%, HER-2 negative ratio 1, Ki-67 10%    09/24/2013 - 10/17/2013 Radiation Therapy    Adjuvant radiation therapy 42.56 Gy in 16 fractions    12/03/2013 - 09/24/2014 Anti-estrogen oral therapy    Adjuvant tamoxifen 20 mg daily 5 years    10/10/2014 - 10/26/2014 Hospital Admission    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    06/09/2016 Imaging    MRI Abdomen: No pancreatic mass; enhancement of T 12 vertebral body, enh lesions L2 and L4 vert bodies, susp of bone  mets, diffuse hep steatosis    06/28/2016 PET scan    T12 and L2 Vert bodies are hypermetabolic sugg of metastatic disease. L4 vert body doesnot have hypermet activitiy. Two other faint activity along Ant sup iliac crest and Rt scapular tip (?inflammatory).    07/22/2016 -  Anti-estrogen oral therapy    Letrozole 2.5 mg daily    12/23/2016 Imaging    Bone scan: No evidence of skeletal metastases. It may be because these lesions are below the inability of bone scan to detect them previously that were noted on PET scan and MRI    09/29/2017 PET scan    No evidence of any hypermetabolic activity.     CHIEF COMPLIANT: Follow-up of iron deficiency anemia and right breast cancer  INTERVAL HISTORY: Alexandr Oehler is a 78 y.o. with above-mentioned history of right breast cancer and severe iron deficiency anemia that required hospitalization and blood transfusions who received her first iron infusion on 04/04/18. She presents to the clinic today alone and is feeling well after most recent iron infusion. She reports pain near her left breast that radiates to her upper back that began when she was lifting her husband from his wheelchair. She has been taking Advil every 3-4 hours for the pain. He has Parkinson's disease and she is stressed as his primary caregiver as his condition has worsened. She has had diarrhea for 3-4 days and hemorrhoids bleeding as a result. She has been fatigued recently which she attributes to not sleeping much. She reports she has been taking half a tablet of Coumadin daily but will increase it two tablets per day. She reviewed her  medication list with me. She notes her sister is also anemic.  REVIEW OF SYSTEMS:   Constitutional: Denies fevers, chills or abnormal weight loss (+) fatigue Eyes: Denies blurriness of vision Ears, nose, mouth, throat, and face: Denies mucositis or sore throat Respiratory: Denies cough, dyspnea or wheezes Cardiovascular: Denies palpitation, chest  discomfort Gastrointestinal:  Denies nausea, heartburn (+) diarrhea, 3-4 days (+) hemorrhoids  Skin: Denies abnormal skin rashes MSK: (+) pain near right breast, radiating to upper back Lymphatics: Denies new lymphadenopathy or easy bruising Neurological: Denies numbness, tingling or new weaknesses Behavioral/Psych: Mood is stable, no new changes  Extremities: No lower extremity edema Breast: denies any lumps or nodules in either breasts  All other systems were reviewed with the patient and are negative.  I have reviewed the past medical history, past surgical history, social history and family history with the patient and they are unchanged from previous note.  ALLERGIES:  is allergic to no known allergies.  MEDICATIONS:  Current Outpatient Medications  Medication Sig Dispense Refill  . acetaminophen (TYLENOL) 325 MG tablet Take 325-650 mg by mouth every 6 (six) hours as needed (for pain.).    Marland Kitchen Ascorbic Acid (VITAMIN C PO) Take by mouth.    Marland Kitchen CALCIUM ASCORBATE PO Take by mouth.    . cetirizine (ZYRTEC) 10 MG tablet Take 10 mg by mouth at bedtime. Allertec    . Cholecalciferol (VITAMIN D3) 5000 UNITS TABS Take 5,000 Units by mouth at bedtime.     . ferrous sulfate 325 (65 FE) MG tablet Take 325 mg by mouth daily with breakfast.    . letrozole (FEMARA) 2.5 MG tablet Take 1 tablet (2.5 mg total) by mouth daily. 90 tablet 3  . Multiple Vitamins-Minerals (MULTIVITAMIN ADULTS PO) Take by mouth.    . warfarin (COUMADIN) 2 MG tablet Take 1 tablet (2 mg total) by mouth daily. 90 tablet 3   No current facility-administered medications for this visit.     PHYSICAL EXAMINATION: ECOG PERFORMANCE STATUS: 1 - Symptomatic but completely ambulatory  Vitals:   07/06/18 1526  BP: 133/70  Pulse: 88  Resp: 17  Temp: 97.8 F (36.6 C)  SpO2: 96%   Filed Weights   07/06/18 1526  Weight: 190 lb 6.4 oz (86.4 kg)    GENERAL: alert, no distress and comfortable SKIN: skin color, texture,  turgor are normal, no rashes or significant lesions EYES: normal, Conjunctiva are pink and non-injected, sclera clear OROPHARYNX: no exudate, no erythema and lips, buccal mucosa, and tongue normal  NECK: supple, thyroid normal size, non-tender, without nodularity LYMPH: no palpable lymphadenopathy in the cervical, axillary or inguinal LUNGS: clear to auscultation and percussion with normal breathing effort HEART: regular rate & rhythm and no murmurs and no lower extremity edema ABDOMEN: abdomen soft, non-tender and normal bowel sounds MUSCULOSKELETAL: no cyanosis of digits and no clubbing  NEURO: alert & oriented x 3 with fluent speech, no focal motor/sensory deficits EXTREMITIES: No lower extremity edema  LABORATORY DATA:  I have reviewed the data as listed CMP Latest Ref Rng & Units 04/03/2018 03/22/2018 03/21/2018  Glucose 70 - 99 mg/dL 120(H) 98 122(H)  BUN 8 - 23 mg/dL 10 9 9   Creatinine 0.44 - 1.00 mg/dL 0.94 0.76 0.78  Sodium 135 - 145 mmol/L 145 140 142  Potassium 3.5 - 5.1 mmol/L 3.9 3.1(L) 3.3(L)  Chloride 98 - 111 mmol/L 105 106 104  CO2 22 - 32 mmol/L 31 25 27   Calcium 8.9 - 10.3 mg/dL 10.0 8.9 9.4  Total Protein 6.5 - 8.1 g/dL 6.7 - 6.2(L)  Total Bilirubin 0.3 - 1.2 mg/dL 0.7 - 1.3(H)  Alkaline Phos 38 - 126 U/L 79 - 62  AST 15 - 41 U/L 19 - 16  ALT 0 - 44 U/L 13 - 13    Lab Results  Component Value Date   WBC 8.0 07/05/2018   HGB 11.4 (L) 07/05/2018   HCT 35.6 (L) 07/05/2018   MCV 91.8 07/05/2018   PLT 259 07/05/2018   NEUTROABS 6.0 07/05/2018    ASSESSMENT & PLAN:  Breast cancer of lower-outer quadrant of right female breast ([place]) 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  Current treatment: Anastrozole 1 mg daily started 07/22/2016  Anastrozole toxicities: 1. Occasional hot flashes 2. Arthralgias  Bone metastasis: I recommended Xgeva and calcium  and vitamin D.  Patient decided that she does not want to take Xgeva.  PET/CT scan 9/39/6886: No hypermetabolic activity suggestive of bone metastases. We may repeat a PET scan once a year.  This will be done in June 2020   Iron deficiency anemia Patient was hospitalized with a hemoglobin of 4.6 and received 3 units of packed red cells.  she has known diagnosis of iron malabsorption. Previous episode of iron deficiency was in 2016.  Current treatment: IV iron therapy with [facility] October 2019 Lab review: Hemoglobin 11.5, ferritin 46, iron saturation 5%  Return to clinic in 3 months with recheck of labs and follow-up.    Orders Placed This Encounter  Procedures  . Ferritin    Standing Status:   Future    Standing Expiration Date:   07/06/2019  . Iron and TIBC    Standing Status:   Future    Standing Expiration Date:   07/06/2019  . CBC with Differential (Cancer Center Only)    Standing Status:   Future    Standing Expiration Date:   07/07/2019  . Protime-INR    Standing Status:   Standing    Number of Occurrences:   10    Standing Expiration Date:   07/07/2019   The patient has a good understanding of the overall plan. she agrees with it. she will call with any problems that may develop before the next visit here.  Nicholas Lose, MD 07/06/2018  Julious Oka Dorshimer am acting as scribe for Dr. Nicholas Lose.  I have reviewed the above documentation for accuracy and completeness, and I agree with the above.

## 2018-07-06 ENCOUNTER — Inpatient Hospital Stay (HOSPITAL_BASED_OUTPATIENT_CLINIC_OR_DEPARTMENT_OTHER): Payer: Medicare Other | Admitting: Hematology and Oncology

## 2018-07-06 ENCOUNTER — Telehealth: Payer: Self-pay | Admitting: Hematology and Oncology

## 2018-07-06 VITALS — BP 133/70 | HR 88 | Temp 97.8°F | Resp 17 | Ht 63.5 in | Wt 190.4 lb

## 2018-07-06 DIAGNOSIS — C50511 Malignant neoplasm of lower-outer quadrant of right female breast: Secondary | ICD-10-CM | POA: Diagnosis not present

## 2018-07-06 DIAGNOSIS — M2550 Pain in unspecified joint: Secondary | ICD-10-CM | POA: Diagnosis not present

## 2018-07-06 DIAGNOSIS — C7951 Secondary malignant neoplasm of bone: Secondary | ICD-10-CM

## 2018-07-06 DIAGNOSIS — D509 Iron deficiency anemia, unspecified: Secondary | ICD-10-CM | POA: Diagnosis not present

## 2018-07-06 DIAGNOSIS — Z170 Estrogen receptor positive status [ER+]: Secondary | ICD-10-CM

## 2018-07-06 LAB — IRON AND TIBC
IRON: 12 ug/dL — AB (ref 41–142)
Saturation Ratios: 4 % — ABNORMAL LOW (ref 21–57)
TIBC: 270 ug/dL (ref 236–444)
UIBC: 258 ug/dL (ref 120–384)

## 2018-07-06 LAB — FERRITIN: Ferritin: 42 ng/mL (ref 11–307)

## 2018-07-06 LAB — VITAMIN D 25 HYDROXY (VIT D DEFICIENCY, FRACTURES): Vit D, 25-Hydroxy: 70.8 ng/mL (ref 30.0–100.0)

## 2018-07-06 NOTE — Assessment & Plan Note (Addendum)
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  Current treatment: Anastrozole 1 mg daily started 07/22/2016  Anastrozole toxicities: 1. Occasional hot flashes 2. Arthralgias  Bone metastasis: I recommended Xgeva and calcium and vitamin D.  Patient decided that she does not want to take Xgeva.  PET/CT scan4/05/2018: No hypermetabolic activity suggestive of bone metastases. We may repeat a PET scan once a year.  This will be done in June 2020 

## 2018-07-06 NOTE — Assessment & Plan Note (Signed)
Patient was hospitalized with a hemoglobin of 4.6 and received 3 units of packed red cells.  she has known diagnosis of iron malabsorption. Previous episode of iron deficiency was in 2016.  Current treatment: IV iron therapy with [facility] October 2019 Lab review: Hemoglobin 11.5, ferritin 46, iron saturation 5%  Return to clinic in 3 months with recheck of labs and follow-up.

## 2018-07-06 NOTE — Telephone Encounter (Signed)
Gave avs and calendar ° °

## 2018-07-10 ENCOUNTER — Telehealth: Payer: Self-pay

## 2018-07-10 ENCOUNTER — Other Ambulatory Visit: Payer: Self-pay

## 2018-07-10 ENCOUNTER — Other Ambulatory Visit: Payer: Self-pay | Admitting: Hematology and Oncology

## 2018-07-10 DIAGNOSIS — C50511 Malignant neoplasm of lower-outer quadrant of right female breast: Secondary | ICD-10-CM

## 2018-07-10 DIAGNOSIS — C7951 Secondary malignant neoplasm of bone: Secondary | ICD-10-CM

## 2018-07-10 DIAGNOSIS — Z170 Estrogen receptor positive status [ER+]: Principal | ICD-10-CM

## 2018-07-10 MED ORDER — TRAMADOL HCL 50 MG PO TABS: 50.0000 mg | ORAL_TABLET | Freq: Four times a day (QID) | ORAL | 0 refills | Status: DC | PRN

## 2018-07-10 NOTE — Telephone Encounter (Signed)
Pt called to request a pain prescription that Dr.Gudena had offered her during her last appt. Pt's severe back pain is normally relieved by advil, however, pt developed bleeding due to coumadin use. Therefore, pt is now unable to take any nsaids. Pt wants to try prescription pain medication and have it sent to her pharmacy today. Will notify Dr.Gudena.

## 2018-07-10 NOTE — Progress Notes (Signed)
Per Dr.Gudena, ok to send pt tramadol for back pain and will order bone scan for evaluation of back pain. Called and notified pt of updates. Pt verbalized understanding.

## 2018-07-25 ENCOUNTER — Other Ambulatory Visit: Payer: Self-pay

## 2018-07-25 MED ORDER — TRAMADOL HCL 50 MG PO TABS: 50.0000 mg | ORAL_TABLET | Freq: Four times a day (QID) | ORAL | 0 refills | Status: DC | PRN

## 2018-07-25 NOTE — Telephone Encounter (Signed)
Pt called to report that her appt for bone scan won't be until tomorrow. Pt still having a lot of back pain and would like to get another refill. Told pt that Dr.Gudena won't be in the office until afternoon today. Will get her pain med refilled this afternoon. Pt verbalized understanding.

## 2018-07-26 ENCOUNTER — Ambulatory Visit (HOSPITAL_COMMUNITY)
Admission: RE | Admit: 2018-07-26 | Discharge: 2018-07-26 | Disposition: A | Discharge status: Home / Self Care | Payer: Medicare Other | Source: Ambulatory Visit | Attending: Hematology and Oncology | Admitting: Hematology and Oncology

## 2018-07-26 ENCOUNTER — Telehealth: Payer: Self-pay

## 2018-07-26 ENCOUNTER — Other Ambulatory Visit: Payer: Self-pay

## 2018-07-26 DIAGNOSIS — C50511 Malignant neoplasm of lower-outer quadrant of right female breast: Secondary | ICD-10-CM | POA: Insufficient documentation

## 2018-07-26 DIAGNOSIS — Z170 Estrogen receptor positive status [ER+]: Secondary | ICD-10-CM | POA: Insufficient documentation

## 2018-07-26 MED ORDER — OXYCODONE-ACETAMINOPHEN 5-325 MG PO TABS: 1.0000 | ORAL_TABLET | ORAL | 0 refills | Status: DC | PRN

## 2018-07-26 MED ORDER — TECHNETIUM TC 99M MEDRONATE IV KIT
20.7000 | PACK | Freq: Once | INTRAVENOUS | Status: AC | PRN
  Administered 2018-07-26: 20.7 via INTRAVENOUS

## 2018-07-26 NOTE — Telephone Encounter (Signed)
Pt called to report that her pain has not been relieved with tramadol. Pt taking every 6hrs as needed. Pt stated that she would like to request something stronger if Dr.Gudena is able to prescribe her something. Told pt that will discuss with MD and give pt update. Pt appreciative of call.

## 2018-07-26 NOTE — Progress Notes (Signed)
Per Dr.Gudena, okay to prescribe pt Percocet q4h po as needed for pain. Will cancel tramadol refill. Pt notified and will have her pick up prescription tomorrow. Dr.Gudena unable to send narcotic prescription electronically due to electronic error during refill. Printed script will be given to patient at this time.

## 2018-07-27 ENCOUNTER — Telehealth: Payer: Self-pay | Admitting: Hematology and Oncology

## 2018-07-27 NOTE — Telephone Encounter (Signed)
I informed the patient that the bone scan showed a single focus of uptake in the right sixth rib posterior lateral area corresponding to the site of her discomfort. I offered her palliative radiation therapy versus watchful monitoring for the next 2 weeks on narcotic pain medication and then making a determination regarding radiation.  Patient will call us in 2 weeks to inform us if the pain is getting better.

## 2018-09-29 ENCOUNTER — Telehealth: Payer: Self-pay | Admitting: Internal Medicine

## 2018-09-29 ENCOUNTER — Telehealth: Payer: Self-pay | Admitting: Hematology and Oncology

## 2018-09-29 NOTE — Telephone Encounter (Signed)
Per patient's request, upcoming appointments in April have been cancelled. Patient will call back to reschedule when ready.  Message to provider.

## 2018-09-29 NOTE — Telephone Encounter (Signed)
Opened in error, please disregard this encounter.

## 2018-09-29 NOTE — Telephone Encounter (Signed)
Called patient per 4/10 sch message - left message for patient to call back to r/s

## 2018-10-03 ENCOUNTER — Other Ambulatory Visit: Payer: Medicare Other

## 2018-10-05 ENCOUNTER — Ambulatory Visit: Payer: Medicare Other | Admitting: Hematology and Oncology

## 2019-01-05 NOTE — Assessment & Plan Note (Signed)
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  Current treatment: Anastrozole 1 mg daily started 07/22/2016  Anastrozole toxicities: 1. Occasional hot flashes 2. Arthralgias  Bone metastasis: I recommended Xgeva and calcium and vitamin D.  Patient decided that she does not want to take Xgeva.  PET/CT FYTW4/46/2863: No hypermetabolic activity suggestive of bone metastases. We may repeat a PET scan once a year.  This will be done in June 2020

## 2019-01-05 NOTE — Assessment & Plan Note (Signed)
Patient was hospitalized with a hemoglobin of 4.6 and received 3 units of packed red cells.  she has known diagnosis of iron malabsorption. Previous episode of iron deficiency was in 2016.  Current treatment: IV iron therapy with [facility] October 2019 Lab review:

## 2019-01-08 ENCOUNTER — Other Ambulatory Visit: Payer: Self-pay

## 2019-01-08 ENCOUNTER — Inpatient Hospital Stay: Payer: Medicare Other | Attending: Hematology and Oncology

## 2019-01-08 DIAGNOSIS — Z170 Estrogen receptor positive status [ER+]: Secondary | ICD-10-CM | POA: Insufficient documentation

## 2019-01-08 DIAGNOSIS — K909 Intestinal malabsorption, unspecified: Secondary | ICD-10-CM | POA: Insufficient documentation

## 2019-01-08 DIAGNOSIS — R109 Unspecified abdominal pain: Secondary | ICD-10-CM | POA: Diagnosis not present

## 2019-01-08 DIAGNOSIS — D509 Iron deficiency anemia, unspecified: Secondary | ICD-10-CM

## 2019-01-08 DIAGNOSIS — C7951 Secondary malignant neoplasm of bone: Secondary | ICD-10-CM | POA: Insufficient documentation

## 2019-01-08 DIAGNOSIS — C50511 Malignant neoplasm of lower-outer quadrant of right female breast: Secondary | ICD-10-CM | POA: Insufficient documentation

## 2019-01-08 DIAGNOSIS — Z79811 Long term (current) use of aromatase inhibitors: Secondary | ICD-10-CM | POA: Insufficient documentation

## 2019-01-08 DIAGNOSIS — M549 Dorsalgia, unspecified: Secondary | ICD-10-CM | POA: Insufficient documentation

## 2019-01-08 DIAGNOSIS — Z7901 Long term (current) use of anticoagulants: Secondary | ICD-10-CM | POA: Diagnosis not present

## 2019-01-08 DIAGNOSIS — M8580 Other specified disorders of bone density and structure, unspecified site: Secondary | ICD-10-CM | POA: Diagnosis not present

## 2019-01-08 DIAGNOSIS — D508 Other iron deficiency anemias: Secondary | ICD-10-CM | POA: Diagnosis not present

## 2019-01-08 LAB — PROTIME-INR
INR: 1.9 — ABNORMAL HIGH (ref 0.8–1.2)
Prothrombin Time: 21.3 seconds — ABNORMAL HIGH (ref 11.4–15.2)

## 2019-01-08 LAB — CBC WITH DIFFERENTIAL (CANCER CENTER ONLY)
Abs Immature Granulocytes: 0.03 10*3/uL (ref 0.00–0.07)
Basophils Absolute: 0.1 10*3/uL (ref 0.0–0.1)
Basophils Relative: 1 %
Eosinophils Absolute: 0.2 10*3/uL (ref 0.0–0.5)
Eosinophils Relative: 3 %
HCT: 40.1 % (ref 36.0–46.0)
Hemoglobin: 12.6 g/dL (ref 12.0–15.0)
Immature Granulocytes: 0 %
Lymphocytes Relative: 21 %
Lymphs Abs: 1.6 10*3/uL (ref 0.7–4.0)
MCH: 28.6 pg (ref 26.0–34.0)
MCHC: 31.4 g/dL (ref 30.0–36.0)
MCV: 90.9 fL (ref 80.0–100.0)
Monocytes Absolute: 0.6 10*3/uL (ref 0.1–1.0)
Monocytes Relative: 7 %
Neutro Abs: 5.3 10*3/uL (ref 1.7–7.7)
Neutrophils Relative %: 68 %
Platelet Count: 259 10*3/uL (ref 150–400)
RBC: 4.41 MIL/uL (ref 3.87–5.11)
RDW: 15.7 % — ABNORMAL HIGH (ref 11.5–15.5)
WBC Count: 7.7 10*3/uL (ref 4.0–10.5)
nRBC: 0 % (ref 0.0–0.2)

## 2019-01-08 LAB — FERRITIN: Ferritin: 44 ng/mL (ref 11–307)

## 2019-01-08 LAB — IRON AND TIBC
Iron: 25 ug/dL — ABNORMAL LOW (ref 41–142)
Saturation Ratios: 7 % — ABNORMAL LOW (ref 21–57)
TIBC: 340 ug/dL (ref 236–444)
UIBC: 315 ug/dL (ref 120–384)

## 2019-01-10 ENCOUNTER — Encounter: Payer: Self-pay | Admitting: Hematology and Oncology

## 2019-01-10 NOTE — Progress Notes (Signed)
Patient Care Team: Kelton Pillar, MD as PCP - General (Family Medicine) Christene Slates, MD as Physician Assistant (Radiology) Brien Few, MD as Consulting Physician (Obstetrics and Gynecology) Lafayette Dragon, MD (Inactive) as Consulting Physician (Gastroenterology) Marcy Panning, MD as Consulting Physician (Oncology) Alphonsa Overall, MD as Consulting Physician (General Surgery) Eppie Gibson, MD as Consulting Physician (Radiation Oncology) Alphonsa Overall, MD as Consulting Physician (General Surgery) Annia Belt, MD as Consulting Physician (Oncology) Nicholas Lose, MD as Consulting Physician (Hematology and Oncology) Irene Limbo, MD as Consulting Physician (Plastic Surgery)  DIAGNOSIS:    ICD-10-CM   1. Bone metastasis ([facility])  C79.51 NM PET Image Restag (PS) Skull Base To Thigh  2. Malignant neoplasm of lower-outer quadrant of right breast of female, estrogen receptor positive ([facility])  C50.511 NM PET Image Restag ([facility]) Skull Base To Thigh   Z17.0   3. Iron deficiency anemia, unspecified iron deficiency anemia type  D50.9     SUMMARY OF ONCOLOGIC HISTORY: Oncology History  Breast cancer of lower-outer quadrant of right female breast ([place])  07/31/2013 Surgery   Right lumpectomy: Invasive lobular cancer, LVID present, BMI present, margins negative, right arm invasive malignant melanoma, 0/4 lymph nodes negative, grade 2, 1.8 cm, T1c N0 stage IA ER 100%, PR 8%, HER-2 negative ratio 1, Ki-67 10%   09/24/2013 - 10/17/2013 Radiation Therapy   Adjuvant radiation therapy 42.56 Gy in 16 fractions   12/03/2013 - 09/24/2014 Anti-estrogen oral therapy   Adjuvant tamoxifen 20 mg daily 5 years   10/10/2014 - 10/26/2014 Hospital Admission   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   06/09/2016 Imaging   MRI Abdomen: No pancreatic mass; enhancement of T 12 vertebral body, enh lesions L2 and L4 vert bodies, susp of  bone mets, diffuse hep steatosis   06/28/2016 PET scan   T12 and L2 Vert bodies are hypermetabolic sugg of metastatic disease. L4 vert body doesnot have hypermet activitiy. Two other faint activity along Ant sup iliac crest and Rt scapular tip (?inflammatory).   07/22/2016 -  Anti-estrogen oral therapy   Letrozole 2.5 mg daily   12/23/2016 Imaging   Bone scan: No evidence of skeletal metastases. It may be because these lesions are below the inability of bone scan to detect them previously that were noted on PET scan and MRI   09/29/2017 PET scan   No evidence of any hypermetabolic activity.     CHIEF COMPLIANT: Follow-up of iron deficiency anemia and right breast cancer  INTERVAL HISTORY: [name] is a 78 y.o. with above-mentioned history of right breast cancer and severe iron deficiency anemia previously treated with blood transfusions and IV iron. Bone scan on 07/26/18 showed a single focus of abnormal radiotracer localization in the right sixth rib with no other evidence of metastatic disease. Labs on 01/08/19 showed: Hg 12.6 HCT 40.1, MCV 90.9, iron saturation 7%, ferritin 44, PT 21.3, INR 1.9. She presents to the clinic today for follow-up.  She complains of severe pain throughout the middle of the abdomen and into the back.  She is unable to sleep on her back.  She has a lot of stressors in life including that her husband has advanced cancer and is in hospice care at home.  REVIEW OF SYSTEMS:   Constitutional: Denies fevers, chills or abnormal weight loss Eyes: Denies blurriness of vision Ears, nose, mouth, throat, and face: Denies mucositis or sore throat Respiratory: Denies cough, dyspnea or wheezes Cardiovascular: Denies palpitation, chest  discomfort Gastrointestinal: Denies nausea, heartburn or change in bowel habits Skin: Denies abnormal skin rashes Lymphatics: Denies new lymphadenopathy or easy bruising Neurological: Severe pain in the back and across the middle of the  abdomen.  She describes this as a burning sensation.  She is unable to sleep on her back. Behavioral/Psych: Mood is stable, no new changes multiple stressors Extremities: No lower extremity edema Breast: denies any pain or lumps or nodules in either breasts All other systems were reviewed with the patient and are negative.  I have reviewed the past medical history, past surgical history, social history and family history with the patient and they are unchanged from previous note.  ALLERGIES:  is allergic to no known allergies.  MEDICATIONS:  Current Outpatient Medications  Medication Sig Dispense Refill  . acetaminophen (TYLENOL) 325 MG tablet Take 325-650 mg by mouth every 6 (six) hours as needed (for pain.).    Marland Kitchen Ascorbic Acid (VITAMIN C PO) Take by mouth.    Marland Kitchen CALCIUM ASCORBATE PO Take by mouth.    . cetirizine (ZYRTEC) 10 MG tablet Take 10 mg by mouth at bedtime. Allertec    . Cholecalciferol (VITAMIN D3) 5000 UNITS TABS Take 5,000 Units by mouth at bedtime.     . ferrous sulfate 325 (65 FE) MG tablet Take 325 mg by mouth daily with breakfast.    . Multiple Vitamins-Minerals (MULTIVITAMIN ADULTS PO) Take by mouth.    . tamoxifen (NOLVADEX) 20 MG tablet Take 1 tablet (20 mg total) by mouth daily. 90 tablet 3  . warfarin (COUMADIN) 2 MG tablet Take 1 tablet (2 mg total) by mouth daily. 90 tablet 3   No current facility-administered medications for this visit.     PHYSICAL EXAMINATION: ECOG PERFORMANCE STATUS: 1 - Symptomatic but completely ambulatory  Vitals:   01/11/19 1249  BP: 135/67  Pulse: (!) 103  Resp: 17  Temp: 98.9 F (37.2 C)  SpO2: 95%   Filed Weights   01/11/19 1249  Weight: 178 lb 12.8 oz (81.1 kg)    GENERAL: alert, no distress and comfortable SKIN: skin color, texture, turgor are normal, no rashes or significant lesions EYES: normal, Conjunctiva are pink and non-injected, sclera clear OROPHARYNX: no exudate, no erythema and lips, buccal mucosa, and  tongue normal  NECK: supple, thyroid normal size, non-tender, without nodularity LYMPH: no palpable lymphadenopathy in the cervical, axillary or inguinal LUNGS: clear to auscultation and percussion with normal breathing effort HEART: regular rate & rhythm and no murmurs and no lower extremity edema ABDOMEN: abdomen soft, non-tender and normal bowel sounds MUSCULOSKELETAL: no cyanosis of digits and no clubbing  NEURO: alert & oriented x 3 with fluent speech, no focal motor/sensory deficits EXTREMITIES: No lower extremity edema  LABORATORY DATA:  I have reviewed the data as listed CMP Latest Ref Rng & Units 04/03/2018 03/22/2018 03/21/2018  Glucose 70 - 99 mg/dL 120(H) 98 122(H)  BUN 8 - 23 mg/dL 10 9 9   Creatinine 0.44 - 1.00 mg/dL 0.94 0.76 0.78  Sodium 135 - 145 mmol/L 145 140 142  Potassium 3.5 - 5.1 mmol/L 3.9 3.1(L) 3.3(L)  Chloride 98 - 111 mmol/L 105 106 104  CO2 22 - 32 mmol/L 31 25 27   Calcium 8.9 - 10.3 mg/dL 10.0 8.9 9.4  Total Protein 6.5 - 8.1 g/dL 6.7 - 6.2(L)  Total Bilirubin 0.3 - 1.2 mg/dL 0.7 - 1.3(H)  Alkaline Phos 38 - 126 U/L 79 - 62  AST 15 - 41 U/L 19 - 16  ALT 0 - 44 U/L 13 - 13    Lab Results  Component Value Date   WBC 7.7 01/08/2019   HGB 12.6 01/08/2019   HCT 40.1 01/08/2019   MCV 90.9 01/08/2019   PLT 259 01/08/2019   NEUTROABS 5.3 01/08/2019    ASSESSMENT & PLAN:  Breast cancer of lower-outer quadrant of right female breast (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  Current treatment: Anastrozole 1 mg daily started 07/22/2016 switch to tamoxifen 01/11/2020 bone density showed a T score of -2 osteopenia Previously she was on tamoxifen and was switched to anastrozole because of blood clots.  However since she is on Coumadin she should be protected from blood clots and therefore we felt safe to initiate tamoxifen therapy.  Osteopenia:  Bone density showed a T score of -2  Bone metastasis: I recommended Xgeva and calcium and vitamin D.  Patient decided that she does not want to take Xgeva.  PET/CT YFVC9/44/9675: No hypermetabolic activity suggestive of bone metastases. Patient has severe pain across the middle of the abdomen and into the back area.  It is extremely tender to touch.  She is unable to sleep on her back.  We will obtain a PET CT scan in a week and follow-up after that. She has a history of fibromyalgia but she does not think this is related to fibromyalgia. She does not want to take any narcotics. If the PET CT scan is negative then we will consider putting her on gabapentin.  Iron deficiency anemia Patient was hospitalized with a hemoglobin of 4.6 and received 3 units of packed red cells.  she has known diagnosis of iron malabsorption. Previous episode of iron deficiency was in 2016.  Current treatment: IV iron therapy with [facility] October 2019 Lab review: Hemoglobin 12.6, MCV 90.9, ferritin 44, iron saturation 7% I do not recommend intravenous iron therapy at this time. Since it is been 9 months since her last iron infusion, we can watch and monitor this with recheck of iron studies in 6 months and follow-up after that.     Orders Placed This Encounter  Procedures  . NM PET Image Restag (PS) Skull Base To Thigh    Standing Status:   Future    Standing Expiration Date:   01/11/2020    Order Specific Question:   ** REASON FOR EXAM (FREE TEXT)    Answer:   Restaging met breast cancer    Order Specific Question:   If indicated for the ordered procedure, I authorize the administration of a radiopharmaceutical per Radiology protocol    Answer:   Yes    Order Specific Question:   Preferred imaging location?    Answer:   [facility]    Order Specific Question:   Radiology Contrast Protocol - do NOT remove file path    Answer:   [email or web address]   The patient has a good  understanding of the overall plan. she agrees with it. she will call with any problems that may develop before the next visit here.  Nicholas Lose, MD 01/11/2019  [name] am acting as scribe for Dr. Nicholas Lose.  I have reviewed the above documentation for accuracy and completeness, and I agree with the above.

## 2019-01-11 ENCOUNTER — Inpatient Hospital Stay (HOSPITAL_BASED_OUTPATIENT_CLINIC_OR_DEPARTMENT_OTHER): Payer: Medicare Other | Admitting: Hematology and Oncology

## 2019-01-11 ENCOUNTER — Other Ambulatory Visit: Payer: Self-pay

## 2019-01-11 VITALS — BP 135/67 | HR 103 | Temp 98.9°F | Resp 17 | Wt 178.8 lb

## 2019-01-11 DIAGNOSIS — M8580 Other specified disorders of bone density and structure, unspecified site: Secondary | ICD-10-CM

## 2019-01-11 DIAGNOSIS — K909 Intestinal malabsorption, unspecified: Secondary | ICD-10-CM

## 2019-01-11 DIAGNOSIS — Z79811 Long term (current) use of aromatase inhibitors: Secondary | ICD-10-CM

## 2019-01-11 DIAGNOSIS — M549 Dorsalgia, unspecified: Secondary | ICD-10-CM

## 2019-01-11 DIAGNOSIS — D508 Other iron deficiency anemias: Secondary | ICD-10-CM

## 2019-01-11 DIAGNOSIS — Z170 Estrogen receptor positive status [ER+]: Secondary | ICD-10-CM

## 2019-01-11 DIAGNOSIS — Z7901 Long term (current) use of anticoagulants: Secondary | ICD-10-CM

## 2019-01-11 DIAGNOSIS — R109 Unspecified abdominal pain: Secondary | ICD-10-CM

## 2019-01-11 DIAGNOSIS — C50511 Malignant neoplasm of lower-outer quadrant of right female breast: Secondary | ICD-10-CM

## 2019-01-11 DIAGNOSIS — C7951 Secondary malignant neoplasm of bone: Secondary | ICD-10-CM | POA: Diagnosis not present

## 2019-01-11 DIAGNOSIS — D509 Iron deficiency anemia, unspecified: Secondary | ICD-10-CM

## 2019-01-11 MED ORDER — TAMOXIFEN CITRATE 20 MG PO TABS: 20.0000 mg | ORAL_TABLET | Freq: Every day | ORAL | 3 refills | Status: DC

## 2019-01-15 ENCOUNTER — Telehealth: Payer: Self-pay | Admitting: Hematology and Oncology

## 2019-01-15 NOTE — Assessment & Plan Note (Deleted)
Patient was hospitalized 03/21/2018 with a hemoglobin of 4.6 and received 3 units of packed red cells.  she has known diagnosis of iron malabsorption. Previous episode of iron deficiency was in 2016.  Current treatment: IV iron therapy with FerahemeOctober 2019 Lab review: Hemoglobin 12.6, MCV 90.9, ferritin 44, iron saturation 7%

## 2019-01-15 NOTE — Telephone Encounter (Signed)
I talk with patient regarding schedule  

## 2019-01-15 NOTE — Assessment & Plan Note (Deleted)
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  Current treatment: Anastrozole 1 mg daily started 07/22/2016 switch to tamoxifen 01/11/2020 bone density showed a T score of -2 osteopenia Previously she was on tamoxifen and was switched to anastrozole because of blood clots.  However since she is on Coumadin she should be protected from blood clots and therefore we felt safe to initiate tamoxifen therapy.  Osteopenia: Bone density showed a T score of -2  Bone metastasis: I recommended Xgeva and calcium and vitamin D. Patient decided that she does not want to take Xgeva.  PET/CT OPFY9/24/4628: No hypermetabolic activity suggestive of bone metastases. Patient has severe pain across the middle of the abdomen and into the back area.  It is extremely tender to touch.  She is unable to sleep on her back.  PET CT scan:

## 2019-01-18 NOTE — Assessment & Plan Note (Signed)
Patient was hospitalized with a hemoglobin of 4.6 and received 3 units of packed red cells.  she has known diagnosis of iron malabsorption. Previous episode of iron deficiency was in 2016.  Prior treatment: IV iron therapy with FerahemeOctober 2019

## 2019-01-18 NOTE — Assessment & Plan Note (Signed)
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  Current treatment: Anastrozole 1 mg daily started 07/22/2016 switch to tamoxifen 01/11/2020 bone density showed a T score of -2 osteopenia Previously she was on tamoxifen and was switched to anastrozole because of blood clots.  However since she is on Coumadin she should be protected from blood clots and therefore we felt safe to initiate tamoxifen therapy.  Osteopenia: Bone density showed a T score of -2  Bone metastasis: I recommended Xgeva and calcium and vitamin D. Patient decided that she does not want to take Xgeva.  PET/CT UDJS9/70/2637: No hypermetabolic activity suggestive of bone metastases. Patient has severe pain across the middle of the abdomen and into the back area.  It is extremely tender to touch.  She is unable to sleep on her back.  PET CT scan 01/23/2019:  If the PET scan is negative then we will start her on gabapentin. History of fibromyalgia.

## 2019-01-22 ENCOUNTER — Ambulatory Visit: Payer: Medicare Other | Admitting: Hematology and Oncology

## 2019-01-23 ENCOUNTER — Other Ambulatory Visit: Payer: Self-pay

## 2019-01-23 ENCOUNTER — Ambulatory Visit (HOSPITAL_COMMUNITY): Payer: Medicare Other

## 2019-01-23 ENCOUNTER — Encounter (HOSPITAL_COMMUNITY)
Admission: RE | Admit: 2019-01-23 | Discharge: 2019-01-23 | Disposition: A | Discharge status: Home / Self Care | Payer: Medicare Other | Source: Ambulatory Visit | Attending: Hematology and Oncology | Admitting: Hematology and Oncology

## 2019-01-23 DIAGNOSIS — D509 Iron deficiency anemia, unspecified: Secondary | ICD-10-CM | POA: Diagnosis not present

## 2019-01-23 DIAGNOSIS — Z170 Estrogen receptor positive status [ER+]: Secondary | ICD-10-CM | POA: Insufficient documentation

## 2019-01-23 DIAGNOSIS — Z7901 Long term (current) use of anticoagulants: Secondary | ICD-10-CM | POA: Insufficient documentation

## 2019-01-23 DIAGNOSIS — C50511 Malignant neoplasm of lower-outer quadrant of right female breast: Secondary | ICD-10-CM

## 2019-01-23 DIAGNOSIS — Z79899 Other long term (current) drug therapy: Secondary | ICD-10-CM | POA: Diagnosis not present

## 2019-01-23 DIAGNOSIS — C7951 Secondary malignant neoplasm of bone: Secondary | ICD-10-CM | POA: Diagnosis present

## 2019-01-23 LAB — GLUCOSE, CAPILLARY: Glucose-Capillary: 95 mg/dL (ref 70–99)

## 2019-01-23 MED ORDER — FLUDEOXYGLUCOSE F - 18 (FDG) INJECTION
9.3000 | Freq: Once | INTRAVENOUS | Status: AC | PRN
  Administered 2019-01-23: 14:00:00 9.3 via INTRAVENOUS

## 2019-01-24 ENCOUNTER — Ambulatory Visit (INDEPENDENT_AMBULATORY_CARE_PROVIDER_SITE_OTHER): Payer: Medicare Other | Admitting: Podiatry

## 2019-01-24 ENCOUNTER — Encounter: Payer: Self-pay | Admitting: Podiatry

## 2019-01-24 DIAGNOSIS — B351 Tinea unguium: Secondary | ICD-10-CM | POA: Diagnosis not present

## 2019-01-24 DIAGNOSIS — L84 Corns and callosities: Secondary | ICD-10-CM | POA: Insufficient documentation

## 2019-01-24 DIAGNOSIS — M79674 Pain in right toe(s): Secondary | ICD-10-CM | POA: Diagnosis not present

## 2019-01-24 DIAGNOSIS — D689 Coagulation defect, unspecified: Secondary | ICD-10-CM

## 2019-01-24 NOTE — Progress Notes (Signed)
This patient presents to the office with chief complaint of thick painful nails on her right foot and multiple calluses on both feet.  She says the nails and the callus are painful walking and wearing her shoes.  Patient is unable to self treat.  Patient has previous history of pulmonary embolus IBS and cancer.  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  She presents the office today for preventative foot care services.  She presents the office today in a wheelchair.    Vascular  Dorsalis pedis and posterior tibial pulses are palpable  B/L.  Capillary return  WNL.  Temperature gradient is  WNL.  Skin turgor  WNL  Sensorium  Senn Weinstein monofilament wire  Diminished . Diminished  tactile sensation.  Nail Exam  Patient has normal nails with no evidence of bacterial or fungal infection except her 4th and 5th toes right foot.  Orthopedic  Exam  Muscle tone and muscle strength  WNL.  No limitations of motion feet  B/L.  No crepitus or joint effusion noted.  Adducto-varus 5th digits  B/L.  Bony prominences are unremarkable.  Skin  No open lesions.  Normal skin texture and turgor. Lister corns 5th toe  B/l.  Onychomycosis  X 2.   Corn 5th  B/l.    IE.  Debride corn 5th B/L.  Debride nails  X 2.  RTC 3 months.   Gardiner Barefoot DPM

## 2019-01-24 NOTE — Progress Notes (Signed)
Patient Care Team: Kelton Pillar, MD as PCP - General (Family Medicine) Christene Slates, MD as Physician Assistant (Radiology) Brien Few, MD as Consulting Physician (Obstetrics and Gynecology) Lafayette Dragon, MD (Inactive) as Consulting Physician (Gastroenterology) Marcy Panning, MD as Consulting Physician (Oncology) Alphonsa Overall, MD as Consulting Physician (General Surgery) Eppie Gibson, MD as Consulting Physician (Radiation Oncology) Alphonsa Overall, MD as Consulting Physician (General Surgery) Annia Belt, MD as Consulting Physician (Oncology) Nicholas Lose, MD as Consulting Physician (Hematology and Oncology) Irene Limbo, MD as Consulting Physician (Plastic Surgery)  DIAGNOSIS:    ICD-10-CM   1. Malignant neoplasm of lower-outer quadrant of right breast of female, estrogen receptor positive ([place])  C50.511 morphine 4 MG/ML injection 2 mg   Z17.0   2. Iron deficiency anemia, unspecified iron deficiency anemia type  D50.9     SUMMARY OF ONCOLOGIC HISTORY: Oncology History  Breast cancer of lower-outer quadrant of right female breast ([place])  07/31/2013 Surgery   Right lumpectomy: Invasive lobular cancer, LVID present, BMI present, margins negative, right arm invasive malignant melanoma, 0/4 lymph nodes negative, grade 2, 1.8 cm, T1c N0 stage IA ER 100%, PR 8%, HER-2 negative ratio 1, Ki-67 10%   09/24/2013 - 10/17/2013 Radiation Therapy   Adjuvant radiation therapy 42.56 Gy in 16 fractions   12/03/2013 - 09/24/2014 Anti-estrogen oral therapy   Adjuvant tamoxifen 20 mg daily 5 years   10/10/2014 - 10/26/2014 Hospital Admission   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   06/09/2016 Imaging   MRI Abdomen: No pancreatic mass; enhancement of T 12 vertebral body, enh lesions L2 and L4 vert bodies, susp of bone mets, diffuse hep steatosis   06/28/2016 PET scan   T12 and L2 Vert bodies are  hypermetabolic sugg of metastatic disease. L4 vert body doesnot have hypermet activitiy. Two other faint activity along Ant sup iliac crest and Rt scapular tip (?inflammatory).   07/22/2016 -  Anti-estrogen oral therapy   Letrozole 2.5 mg daily   12/23/2016 Imaging   Bone scan: No evidence of skeletal metastases. It may be because these lesions are below the inability of bone scan to detect them previously that were noted on PET scan and MRI   09/29/2017 PET scan   No evidence of any hypermetabolic activity.   01/23/2019 PET scan   New widespread hypermetabolic skeletal metastasis involving the femurs, pelvis, spine, ribs and shoulder girdles. No evidence soft tissue metastasis.     CHIEF COMPLIANT: Follow-up of iron deficiency anemia and metastatic breast cancer to review PET scan   INTERVAL HISTORY: [name] is a 78 y.o. with above-mentioned history of metastatic breast cancer. She also has a history of severe iron deficiency anemia previously treated with blood transfusions and IV iron. PET scan on 01/23/19 showed widespread skeletal metastasis and no evidence of soft tissue metastasis. She presents to the clinic today to review her scan and discuss treatment options.  Patient was in intractable mid back pain today.  She is in a wheelchair.  She has very little social support because her husband needs full-time care and is now living with their son.  She has to drive herself in spite of all of this pain.  She also had small amounts of the body but her back is the worst today.  She is in tears as well.  REVIEW OF SYSTEMS:   Constitutional: Denies fevers, chills or abnormal weight loss, complains of severe mid back pain Eyes: Denies  blurriness of vision Ears, nose, mouth, throat, and face: Denies mucositis or sore throat Respiratory: Denies cough, dyspnea or wheezes Cardiovascular: Denies palpitation, chest discomfort Gastrointestinal: Denies nausea, heartburn or change in bowel habits  Skin: Denies abnormal skin rashes Lymphatics: Denies new lymphadenopathy or easy bruising Neurological: Denies numbness, tingling or new weaknesses Behavioral/Psych: Mood is stable, no new changes  Extremities: No lower extremity edema Breast: denies any pain or lumps or nodules in either breasts All other systems were reviewed with the patient and are negative.  I have reviewed the past medical history, past surgical history, social history and family history with the patient and they are unchanged from previous note.  ALLERGIES:  is allergic to no known allergies.  MEDICATIONS:  Current Outpatient Medications  Medication Sig Dispense Refill  . acetaminophen (TYLENOL) 325 MG tablet Take 325-650 mg by mouth every 6 (six) hours as needed (for pain.).    Marland Kitchen CALCIUM ASCORBATE PO Take by mouth.    . cetirizine (ZYRTEC) 10 MG tablet Take 10 mg by mouth at bedtime. Allertec    . Cholecalciferol (VITAMIN D3) 5000 UNITS TABS Take 5,000 Units by mouth at bedtime.     . fentaNYL (DURAGESIC) 25 MCG/HR Place 1 patch onto the skin every 3 (three) days. 10 patch 0  . ferrous sulfate 325 (65 FE) MG tablet Take 325 mg by mouth daily with breakfast.    . oxyCODONE-acetaminophen (PERCOCET/ROXICET) 5-325 MG tablet Take 1 tablet by mouth every 4 (four) hours as needed for severe pain. 60 tablet 0  . palbociclib (IBRANCE) 100 MG tablet Take 1 tablet (100 mg total) by mouth daily. Take for 21 days on, 7 days off, repeat every 28 days. 21 tablet 3  . tamoxifen (NOLVADEX) 20 MG tablet Take 1 tablet (20 mg total) by mouth daily. 90 tablet 3  . warfarin (COUMADIN) 2 MG tablet Take 1 tablet (2 mg total) by mouth daily. 90 tablet 3   No current facility-administered medications for this visit.     PHYSICAL EXAMINATION: ECOG PERFORMANCE STATUS: 1 - Symptomatic but completely ambulatory  Vitals:   01/25/19 1323  BP: (!) 144/81  Pulse: 95  Resp: 17  Temp: 98.7 F (37.1 C)  SpO2: 96%   Filed Weights     GENERAL: alert, no distress and comfortable SKIN: skin color, texture, turgor are normal, no rashes or significant lesions EYES: normal, Conjunctiva are pink and non-injected, sclera clear OROPHARYNX: no exudate, no erythema and lips, buccal mucosa, and tongue normal  NECK: supple, thyroid normal size, non-tender, without nodularity LYMPH: no palpable lymphadenopathy in the cervical, axillary or inguinal LUNGS: clear to auscultation and percussion with normal breathing effort HEART: regular rate & rhythm and no murmurs and no lower extremity edema ABDOMEN: abdomen soft, non-tender and normal bowel sounds MUSCULOSKELETAL: no cyanosis of digits and no clubbing  NEURO: alert & oriented x 3 with fluent speech, no focal motor/sensory deficits EXTREMITIES: No lower extremity edema  LABORATORY DATA:  I have reviewed the data as listed CMP Latest Ref Rng & Units 04/03/2018 03/22/2018 03/21/2018  Glucose 70 - 99 mg/dL 120(H) 98 122(H)  BUN 8 - 23 mg/dL _0 Creatinine 0.44 - 1.00 mg/dL 0.94 0.76 0.78  Sodium 135 - 145 mmol/L 145 140 142  Potassium 3.5 - 5.1 mmol/L 3.9 3.1(L) 3.3(L)  Chloride 98 - 111 mmol/L 105 106 104  CO2 22 - 32 mmol/L _1 Calcium 8.9 - 10.3 mg/dL 10.0 8.9 9.4  Total  Protein 6.5 - 8.1 g/dL 6.7 - 6.2(L)  Total Bilirubin 0.3 - 1.2 mg/dL 0.7 - 1.3(H)  Alkaline Phos 38 - 126 U/L 79 - 62  AST 15 - 41 U/L 19 - 16  ALT 0 - 44 U/L 13 - 13    Lab Results  Component Value Date   WBC 7.7 01/08/2019   HGB 12.6 01/08/2019   HCT 40.1 01/08/2019   MCV 90.9 01/08/2019   PLT 259 01/08/2019   NEUTROABS 5.3 01/08/2019    ASSESSMENT & PLAN:  Breast cancer of lower-outer quadrant of right female breast ([place]) 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  Current treatment: Anastrozole 1 mg daily started 07/22/2016 switch to tamoxifen 01/11/2020  Based on progression of  disease on the PET CT scan, I recommended addition of Ibrance to her treatment. Patient immediately told me that she cannot afford Ibrance.  We will try to get her [name] with the help of grant money or [employer] support.    Osteopenia: Bone density showed a T score of -2  Bone metastasis: I recommended Xgeva and calcium and vitamin D. Patient decided that she does not want to take Xgeva.  PET CT scan 01/23/2019: New widespread hypermetabolic bone metastases involving femurs, pelvis, spine, ribs and shoulder girdles, new focal kyphosis T8 concerning for pathologic compression fracture.  No soft tissue metastases.  Recommendation: 1.  Palliative radiation therapy to the back for pain control: I requested Dr. Lisbeth Renshaw to help and he was graciously willing to take her down for simulation and potentially start radiation on Monday.  She will need assistance with transportation. 2.  Addition of Ibrance to antiestrogen therapy 3.  For immediate pain relief we gave her an injection of morphine 2 mg intramuscularly today.  Ibrance: I discussed the risks and benefits of Ibrance including myelosuppression especially neutropenia and with that risk of infection, there is risk of pulmonary embolism and mild peripheral neuropathy as well. Fatigue, nausea, diarrhea, decreased appetite as well as alopecia and thrombocytopenia are also potential side effects of Ibrance  History of fibromyalgia. Return to clinic 2 weeks after starting Ibrance.  Iron deficiency anemia Patient was hospitalized with a hemoglobin of 4.6 and received 3 units of packed red cells.  she has known diagnosis of iron malabsorption. Previous episode of iron deficiency was in 2016.  Prior treatment: IV iron therapy with FerahemeOctober 2019    No orders of the defined types were placed in this encounter.  The patient has a good understanding of the overall plan. she agrees with it. she will call with any problems that may develop before  the next visit here.  Nicholas Lose, MD 01/25/2019  Julious Oka Dorshimer am acting as scribe for Dr. Nicholas Lose.  I have reviewed the above documentation for accuracy and completeness, and I agree with the above.

## 2019-01-25 ENCOUNTER — Ambulatory Visit
Admission: RE | Admit: 2019-01-25 | Discharge: 2019-01-25 | Disposition: A | Discharge status: Home / Self Care | Payer: Medicare Other | Source: Ambulatory Visit | Attending: Radiation Oncology | Admitting: Radiation Oncology

## 2019-01-25 ENCOUNTER — Inpatient Hospital Stay: Payer: Medicare Other | Attending: Hematology and Oncology | Admitting: Hematology and Oncology

## 2019-01-25 ENCOUNTER — Other Ambulatory Visit: Payer: Self-pay

## 2019-01-25 DIAGNOSIS — Z510 Encounter for antineoplastic radiation therapy: Secondary | ICD-10-CM | POA: Insufficient documentation

## 2019-01-25 DIAGNOSIS — C50511 Malignant neoplasm of lower-outer quadrant of right female breast: Secondary | ICD-10-CM

## 2019-01-25 DIAGNOSIS — I10 Essential (primary) hypertension: Secondary | ICD-10-CM | POA: Diagnosis not present

## 2019-01-25 DIAGNOSIS — Z7901 Long term (current) use of anticoagulants: Secondary | ICD-10-CM | POA: Insufficient documentation

## 2019-01-25 DIAGNOSIS — Z170 Estrogen receptor positive status [ER+]: Secondary | ICD-10-CM | POA: Insufficient documentation

## 2019-01-25 DIAGNOSIS — E7800 Pure hypercholesterolemia, unspecified: Secondary | ICD-10-CM | POA: Diagnosis not present

## 2019-01-25 DIAGNOSIS — G893 Neoplasm related pain (acute) (chronic): Secondary | ICD-10-CM | POA: Insufficient documentation

## 2019-01-25 DIAGNOSIS — K219 Gastro-esophageal reflux disease without esophagitis: Secondary | ICD-10-CM | POA: Insufficient documentation

## 2019-01-25 DIAGNOSIS — D509 Iron deficiency anemia, unspecified: Secondary | ICD-10-CM | POA: Diagnosis not present

## 2019-01-25 DIAGNOSIS — C7951 Secondary malignant neoplasm of bone: Secondary | ICD-10-CM | POA: Insufficient documentation

## 2019-01-25 DIAGNOSIS — Z79899 Other long term (current) drug therapy: Secondary | ICD-10-CM | POA: Insufficient documentation

## 2019-01-25 DIAGNOSIS — C50211 Malignant neoplasm of upper-inner quadrant of right female breast: Secondary | ICD-10-CM | POA: Diagnosis not present

## 2019-01-25 DIAGNOSIS — M797 Fibromyalgia: Secondary | ICD-10-CM | POA: Diagnosis not present

## 2019-01-25 DIAGNOSIS — K449 Diaphragmatic hernia without obstruction or gangrene: Secondary | ICD-10-CM | POA: Insufficient documentation

## 2019-01-25 DIAGNOSIS — Z923 Personal history of irradiation: Secondary | ICD-10-CM | POA: Insufficient documentation

## 2019-01-25 DIAGNOSIS — E559 Vitamin D deficiency, unspecified: Secondary | ICD-10-CM | POA: Insufficient documentation

## 2019-01-25 DIAGNOSIS — Z86711 Personal history of pulmonary embolism: Secondary | ICD-10-CM | POA: Insufficient documentation

## 2019-01-25 MED ORDER — MORPHINE SULFATE (PF) 4 MG/ML IV SOLN
INTRAVENOUS | Status: AC
  Filled 2019-01-25: qty 1

## 2019-01-25 MED ORDER — FENTANYL 25 MCG/HR TD PT72: 1.0000 | MEDICATED_PATCH | TRANSDERMAL | 0 refills | Status: DC

## 2019-01-25 MED ORDER — OXYCODONE-ACETAMINOPHEN 5-325 MG PO TABS: 1.0000 | ORAL_TABLET | ORAL | 0 refills | Status: DC | PRN

## 2019-01-25 MED ORDER — MORPHINE SULFATE (PF) 4 MG/ML IV SOLN
2.0000 mg | Freq: Once | INTRAVENOUS | Status: AC
  Administered 2019-01-25: 2 mg via INTRAMUSCULAR

## 2019-01-25 MED ORDER — PALBOCICLIB 100 MG PO TABS: 100.0000 mg | ORAL_TABLET | Freq: Every day | ORAL | 3 refills | Status: DC

## 2019-01-26 ENCOUNTER — Ambulatory Visit: Payer: Medicare Other | Admitting: Radiation Oncology

## 2019-01-26 ENCOUNTER — Telehealth: Payer: Self-pay | Admitting: Pharmacist

## 2019-01-26 ENCOUNTER — Telehealth: Payer: Self-pay | Admitting: Hematology and Oncology

## 2019-01-26 NOTE — Telephone Encounter (Signed)
I could not reach patient regarding schedule  °

## 2019-01-29 ENCOUNTER — Other Ambulatory Visit: Payer: Self-pay

## 2019-01-29 ENCOUNTER — Telehealth: Payer: Self-pay

## 2019-01-29 ENCOUNTER — Ambulatory Visit
Admission: RE | Admit: 2019-01-29 | Discharge: 2019-01-29 | Disposition: A | Discharge status: Home / Self Care | Payer: Medicare Other | Source: Ambulatory Visit | Attending: Radiation Oncology | Admitting: Radiation Oncology

## 2019-01-29 DIAGNOSIS — C7951 Secondary malignant neoplasm of bone: Secondary | ICD-10-CM | POA: Diagnosis not present

## 2019-01-29 NOTE — Telephone Encounter (Signed)
Oral Oncology Patient Advocate Encounter  Received notification from [employer] that prior authorization for [name] is required.  PA submitted on CoverMyMeds Key [id number] Status is pending  Oral Oncology Clinic will continue to follow.  [name] Patient [place] Phone [phone] Fax [phone] 01/29/2019    1:53 PM

## 2019-01-29 NOTE — Telephone Encounter (Signed)
Oral Oncology Pharmacist Encounter  Received new prescription for Ibrance (palbociclib) for the treatment of hormone receptor positive, HER-2 negative metastatic breast cancer in conjunction with tamoxifen, planned duration until disease progression or unacceptable toxicity.  Patient was initially diagnosed with breast cancer in 2015 and underwent a right lumpectomy, radiation therapy, and treatment with adjuvant tamoxifen.   Patient will be initiated on Ibrance (palbociclib) 100 mg tablet by mouth daily for 21 days on, 7 days off, repeated every 28 days.   Noted MD will be starting patient on a dose reduction of Ibrance at initiation  Labs from [place] assessed, [place] for treatment initiation with Ibrance.  Current medication list in Epic reviewed, DDIs with Ibrance identified:  [name] is weak CYP3A4 inhibitor and may increase serum concentrations of CYP3A4 substrates, including fentanyl, oxycodone-acetaminophen, and tamoxifen. Will counsel patient on monitoring for increased side effects such as feeling over sedated, fatigued. If patient experiences side effects, may need to consider dose reducing fentanyl and oxycodone-acetaminophen doses. Will counsel patient on monitoring for increased side effects from tamoxifen. Given that patient is being started on a lower Ibrance dose, and that it is a weak CYP3A4 inhibitor, there are no dose adjustments necessary at this time.     Prescription has been e-scribed to the [facility] for benefits analysis and approval.  Oral Oncology Clinic will continue to follow for insurance authorization, copayment issues, initial counseling and start date.  Leron Croak, PharmD PGY2 Oncology/Hematology Pharmacy Resident 01/29/2019 9:47 AM Oral Oncology Clinic [phone]
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
## 2019-01-30 ENCOUNTER — Telehealth: Payer: Self-pay

## 2019-01-30 ENCOUNTER — Other Ambulatory Visit: Payer: Self-pay

## 2019-01-30 ENCOUNTER — Ambulatory Visit
Admission: RE | Admit: 2019-01-30 | Discharge: 2019-01-30 | Disposition: A | Discharge status: Home / Self Care | Payer: Medicare Other | Source: Ambulatory Visit | Attending: Radiation Oncology | Admitting: Radiation Oncology

## 2019-01-30 DIAGNOSIS — C7951 Secondary malignant neoplasm of bone: Secondary | ICD-10-CM | POA: Diagnosis not present

## 2019-01-30 NOTE — Telephone Encounter (Signed)
Oral Oncology Patient Advocate [name] copay is [phone]. This is not affordable for the patient.  There are no grants open right now for the patients disease state but I will watch for one to open.  I can help the patient apply for patient assistance with [employer] in an attempt to make the patients out of pocket cost $0.  I called the patient and went over this information with her, she agreed to apply for manufacturer assistance. I have filled out the application for her and emailed her portion to sign per her request. She will fax that back to me. She will also send me her income documentation by fax.  The patient verbalized understanding and great appreciation.  This encounter will be updated until final determination.  [place] Patient [place] Phone [phone] Fax [phone] 01/30/2019   11:28 AM

## 2019-01-30 NOTE — Telephone Encounter (Signed)
Oral Oncology Patient Advocate Encounter  I faxed the completed application today, 2/54.  This encounter will be updated until final determination.  [place] Patient [place] Phone [phone] Fax [phone] 01/30/2019   2:41 PM

## 2019-01-30 NOTE — Telephone Encounter (Signed)
Oral Oncology Patient Advocate Encounter  Prior Authorization for [name] has been approved.    PA# [id number] Effective dates: 12/30/18 through 01/28/22  Oral Oncology Clinic will continue to follow.   [place] Patient [place] Phone [phone] Fax [phone] 01/30/2019    8:27 AM

## 2019-01-31 ENCOUNTER — Other Ambulatory Visit: Payer: Self-pay

## 2019-01-31 ENCOUNTER — Ambulatory Visit
Admission: RE | Admit: 2019-01-31 | Discharge: 2019-01-31 | Disposition: A | Discharge status: Home / Self Care | Payer: Medicare Other | Source: Ambulatory Visit | Attending: Radiation Oncology | Admitting: Radiation Oncology

## 2019-01-31 DIAGNOSIS — C7951 Secondary malignant neoplasm of bone: Secondary | ICD-10-CM | POA: Diagnosis not present

## 2019-02-01 ENCOUNTER — Ambulatory Visit
Admission: RE | Admit: 2019-02-01 | Discharge: 2019-02-01 | Disposition: A | Discharge status: Home / Self Care | Payer: Medicare Other | Source: Ambulatory Visit | Attending: Radiation Oncology | Admitting: Radiation Oncology

## 2019-02-01 ENCOUNTER — Other Ambulatory Visit: Payer: Self-pay

## 2019-02-01 DIAGNOSIS — C7951 Secondary malignant neoplasm of bone: Secondary | ICD-10-CM | POA: Diagnosis not present

## 2019-02-02 ENCOUNTER — Other Ambulatory Visit: Payer: Self-pay

## 2019-02-02 ENCOUNTER — Ambulatory Visit
Admission: RE | Admit: 2019-02-02 | Discharge: 2019-02-02 | Disposition: A | Discharge status: Home / Self Care | Payer: Medicare Other | Source: Ambulatory Visit | Attending: Radiation Oncology | Admitting: Radiation Oncology

## 2019-02-02 DIAGNOSIS — C7951 Secondary malignant neoplasm of bone: Secondary | ICD-10-CM | POA: Diagnosis not present

## 2019-02-02 NOTE — Telephone Encounter (Signed)
Oral Oncology Patient Advocate Encounter  I followed up with [employer] on the [place] application today. They have everything they need from [place] and the application is in processing.  I called the patient and left a detailed voicemail.  This encounter will be updated until final determination.  [place] Patient [place] Phone [phone] Fax [phone] 02/02/2019   12:06 PM

## 2019-02-02 NOTE — Telephone Encounter (Signed)
Oral Oncology Patient Advocate Encounter  Received notification from [place] Patient Assistance program that patient has been successfully enrolled into their program to receive Ibrance from the manufacturer at $0 out of pocket until 06/21/19.    I called and spoke with patient.  She knows we will have to re-apply.   Patient knows to call the office with questions or concerns.   Oral Oncology Clinic will continue to follow.  [place] Patient [place] Phone [phone] Fax [phone] 02/02/2019    1:47 PM

## 2019-02-05 ENCOUNTER — Other Ambulatory Visit: Payer: Self-pay

## 2019-02-05 ENCOUNTER — Ambulatory Visit
Admission: RE | Admit: 2019-02-05 | Discharge: 2019-02-05 | Disposition: A | Discharge status: Home / Self Care | Payer: Medicare Other | Source: Ambulatory Visit | Attending: Radiation Oncology | Admitting: Radiation Oncology

## 2019-02-05 DIAGNOSIS — C7951 Secondary malignant neoplasm of bone: Secondary | ICD-10-CM | POA: Diagnosis not present

## 2019-02-06 ENCOUNTER — Other Ambulatory Visit: Payer: Self-pay | Admitting: Radiation Oncology

## 2019-02-06 ENCOUNTER — Other Ambulatory Visit: Payer: Self-pay

## 2019-02-06 ENCOUNTER — Ambulatory Visit
Admission: RE | Admit: 2019-02-06 | Discharge: 2019-02-06 | Disposition: A | Discharge status: Home / Self Care | Payer: Medicare Other | Source: Ambulatory Visit | Attending: Radiation Oncology | Admitting: Radiation Oncology

## 2019-02-06 DIAGNOSIS — C7951 Secondary malignant neoplasm of bone: Secondary | ICD-10-CM | POA: Diagnosis not present

## 2019-02-06 MED ORDER — OXYCODONE-ACETAMINOPHEN 5-325 MG PO TABS: 1.0000 | ORAL_TABLET | Freq: Four times a day (QID) | ORAL | 0 refills | Status: DC | PRN

## 2019-02-07 ENCOUNTER — Ambulatory Visit
Admission: RE | Admit: 2019-02-07 | Discharge: 2019-02-07 | Disposition: A | Discharge status: Home / Self Care | Payer: Medicare Other | Source: Ambulatory Visit | Attending: Radiation Oncology | Admitting: Radiation Oncology

## 2019-02-07 ENCOUNTER — Other Ambulatory Visit: Payer: Self-pay

## 2019-02-07 ENCOUNTER — Telehealth: Payer: Self-pay

## 2019-02-07 DIAGNOSIS — C7951 Secondary malignant neoplasm of bone: Secondary | ICD-10-CM

## 2019-02-07 NOTE — Telephone Encounter (Signed)
Oral Oncology Patient Advocate Encounter  I followed up with the patient to see if she had received the Ibrance from [employer] yet. She has not received the Ibrance. I gave her the number to call and get her Ibrance shipped to her, [phone].  Oral oncology will continue to follow.  [place] Patient [place] Phone [phone] Fax [phone] 02/07/2019   12:02 PM

## 2019-02-07 NOTE — Telephone Encounter (Signed)
RN returned call.  Left voicemail for patient to return call.

## 2019-02-08 ENCOUNTER — Telehealth: Payer: Self-pay | Admitting: *Deleted

## 2019-02-08 ENCOUNTER — Other Ambulatory Visit: Payer: Self-pay

## 2019-02-08 ENCOUNTER — Encounter: Payer: Self-pay | Admitting: Radiation Oncology

## 2019-02-08 ENCOUNTER — Ambulatory Visit
Admission: RE | Admit: 2019-02-08 | Discharge: 2019-02-08 | Disposition: A | Discharge status: Home / Self Care | Payer: Medicare Other | Source: Ambulatory Visit | Attending: Radiation Oncology | Admitting: Radiation Oncology

## 2019-02-08 DIAGNOSIS — C7951 Secondary malignant neoplasm of bone: Secondary | ICD-10-CM | POA: Diagnosis not present

## 2019-02-08 NOTE — Telephone Encounter (Signed)
Received call from pt stating she received her Ibrance in the mail yesterday and started taking it yesterday as well.  Pt requestingRN to notify [name] with oral oncology.

## 2019-02-08 NOTE — Telephone Encounter (Signed)
Oral Oncology Patient Advocate Encounter  Patient called to confirm that she received her [name] and took her first dose yesterday, 02/07/19.  [place] Patient [place] Phone [phone] Fax [phone] 02/08/2019   10:01 AM

## 2019-02-09 ENCOUNTER — Ambulatory Visit: Payer: Medicare Other

## 2019-02-09 ENCOUNTER — Telehealth: Payer: Self-pay | Admitting: Pharmacist

## 2019-02-09 NOTE — Telephone Encounter (Signed)
Oral Chemotherapy Pharmacist Encounter  I spoke with patient for overview of: Ibrance for the treatment of metastatic, hormone-receptor positive, HER2 receptor negative breast cancer, in combination with tamoxifen, planned duration until disease progression or unacceptable toxicity.   Counseled patient on administration, dosing, side effects, monitoring, drug-food interactions, safe handling, storage, and disposal.  Patient will take Ibrance 169m tablets, 1 tablet by mouth once daily, with or without food, taken for 3 weeks on, 1 week off, and repeated.  Patient knows to avoid grapefruit and grapefruit juice while on treatment with Ibrance.  Patient is taking tamoxifen 21monce daily with Ibrance.  Ibrance start date: 02/07/19  Adverse effects include but are not limited to: fatigue, hair loss, GI upset, nausea, decreased blood counts, and increased upper respiratory infections. Severe, life-threatening, and/or fatal interstitial lung disease (ILD) and/or pneumonitis may occur with CDK 4/6 inhibitors.  Patient will obtain anti diarrheal and alert the office of 4 or more loose stools above baseline.  Patient reminded of WBC check on Cycle 1 Day 14 for dose and ANC assessment.  Reviewed with patient importance of keeping a medication schedule and plan for any missed doses.  Medication reconciliation performed and medication/allergy list updated.  Patient receiving Ibrance through [employer] Assistance.   All questions answered.  Ms. HaEduardooiced understanding and appreciation.   Patient knows to call the office with questions or concerns.  ReLeron CroakPharmD PGY2 Hematology/Oncology Pharmacy Resident 02/09/2019 12:32 PM Oral Oncology Clinic [phone]

## 2019-02-12 ENCOUNTER — Ambulatory Visit: Payer: Medicare Other

## 2019-02-13 ENCOUNTER — Ambulatory Visit: Payer: Medicare Other

## 2019-02-14 ENCOUNTER — Ambulatory Visit: Payer: Medicare Other

## 2019-02-14 NOTE — Assessment & Plan Note (Deleted)
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  Current treatment: Anastrozole 1 mg daily started 02/01/2018switch to tamoxifen 01/11/2020  Based on progression of disease on the PET CT scan, I recommended addition of Ibrance to her treatment. Patient immediately told me that she cannot afford Ibrance.  We will try to get her [name] with the help of grant money or [employer] support.    Osteopenia: Bone density showed a T score of -2  Bone metastasis: I recommended Xgeva and calcium and vitamin D. Patient decided that she does not want to take Xgeva.  PET CT scan 01/23/2019: New widespread hypermetabolic bone metastases involving femurs, pelvis, spine, ribs and shoulder girdles, new focal kyphosis T8 concerning for pathologic compression fracture.  No soft tissue metastases.  Recommendation: 1.  Palliative radiation therapy to the back for pain control 2.  Addition of Ibrance to antiestrogen therapy  Ibrance toxicities:  Prior history of iron deficiency anemia: Feraheme 2016, October 2019 Return to clinic in 2 weeks for labs and follow-up.

## 2019-02-15 ENCOUNTER — Ambulatory Visit: Payer: Medicare Other

## 2019-02-16 ENCOUNTER — Ambulatory Visit: Payer: Medicare Other | Admitting: Hematology and Oncology

## 2019-02-16 ENCOUNTER — Ambulatory Visit: Payer: Medicare Other

## 2019-02-16 ENCOUNTER — Other Ambulatory Visit: Payer: Medicare Other

## 2019-02-19 ENCOUNTER — Ambulatory Visit: Payer: Medicare Other

## 2019-02-20 ENCOUNTER — Inpatient Hospital Stay (HOSPITAL_COMMUNITY)
Admission: EM | Admit: 2019-02-20 | Discharge: 2019-02-25 | DRG: 543 | Disposition: A | Discharge status: Hospice | Payer: Medicare Other | Attending: Internal Medicine | Admitting: Internal Medicine

## 2019-02-20 ENCOUNTER — Ambulatory Visit: Payer: Medicare Other

## 2019-02-20 ENCOUNTER — Encounter (HOSPITAL_COMMUNITY): Payer: Self-pay | Admitting: Emergency Medicine

## 2019-02-20 ENCOUNTER — Inpatient Hospital Stay: Payer: Medicare Other | Attending: Hematology and Oncology | Admitting: Hematology and Oncology

## 2019-02-20 ENCOUNTER — Emergency Department (HOSPITAL_COMMUNITY): Payer: Medicare Other

## 2019-02-20 ENCOUNTER — Inpatient Hospital Stay: Payer: Medicare Other

## 2019-02-20 ENCOUNTER — Other Ambulatory Visit: Payer: Self-pay

## 2019-02-20 ENCOUNTER — Telehealth: Payer: Self-pay

## 2019-02-20 DIAGNOSIS — M549 Dorsalgia, unspecified: Secondary | ICD-10-CM | POA: Diagnosis present

## 2019-02-20 DIAGNOSIS — Z79899 Other long term (current) drug therapy: Secondary | ICD-10-CM

## 2019-02-20 DIAGNOSIS — E876 Hypokalemia: Secondary | ICD-10-CM | POA: Diagnosis present

## 2019-02-20 DIAGNOSIS — S2232XA Fracture of one rib, left side, initial encounter for closed fracture: Secondary | ICD-10-CM | POA: Diagnosis present

## 2019-02-20 DIAGNOSIS — Z823 Family history of stroke: Secondary | ICD-10-CM

## 2019-02-20 DIAGNOSIS — W1830XA Fall on same level, unspecified, initial encounter: Secondary | ICD-10-CM | POA: Diagnosis present

## 2019-02-20 DIAGNOSIS — S2249XA Multiple fractures of ribs, unspecified side, initial encounter for closed fracture: Secondary | ICD-10-CM | POA: Diagnosis present

## 2019-02-20 DIAGNOSIS — B9620 Unspecified Escherichia coli [E. coli] as the cause of diseases classified elsewhere: Secondary | ICD-10-CM | POA: Diagnosis present

## 2019-02-20 DIAGNOSIS — Z825 Family history of asthma and other chronic lower respiratory diseases: Secondary | ICD-10-CM

## 2019-02-20 DIAGNOSIS — Z79891 Long term (current) use of opiate analgesic: Secondary | ICD-10-CM

## 2019-02-20 DIAGNOSIS — M8448XA Pathological fracture, other site, initial encounter for fracture: Secondary | ICD-10-CM | POA: Diagnosis present

## 2019-02-20 DIAGNOSIS — Z66 Do not resuscitate: Secondary | ICD-10-CM | POA: Diagnosis present

## 2019-02-20 DIAGNOSIS — Y92009 Unspecified place in unspecified non-institutional (private) residence as the place of occurrence of the external cause: Secondary | ICD-10-CM

## 2019-02-20 DIAGNOSIS — C50911 Malignant neoplasm of unspecified site of right female breast: Secondary | ICD-10-CM | POA: Diagnosis not present

## 2019-02-20 DIAGNOSIS — I10 Essential (primary) hypertension: Secondary | ICD-10-CM | POA: Diagnosis present

## 2019-02-20 DIAGNOSIS — C7951 Secondary malignant neoplasm of bone: Secondary | ICD-10-CM | POA: Diagnosis not present

## 2019-02-20 DIAGNOSIS — E7800 Pure hypercholesterolemia, unspecified: Secondary | ICD-10-CM | POA: Diagnosis present

## 2019-02-20 DIAGNOSIS — Z86718 Personal history of other venous thrombosis and embolism: Secondary | ICD-10-CM

## 2019-02-20 DIAGNOSIS — R791 Abnormal coagulation profile: Secondary | ICD-10-CM

## 2019-02-20 DIAGNOSIS — C50511 Malignant neoplasm of lower-outer quadrant of right female breast: Secondary | ICD-10-CM | POA: Diagnosis present

## 2019-02-20 DIAGNOSIS — M8458XA Pathological fracture in neoplastic disease, other specified site, initial encounter for fracture: Secondary | ICD-10-CM | POA: Diagnosis present

## 2019-02-20 DIAGNOSIS — W19XXXA Unspecified fall, initial encounter: Secondary | ICD-10-CM | POA: Diagnosis not present

## 2019-02-20 DIAGNOSIS — I82403 Acute embolism and thrombosis of unspecified deep veins of lower extremity, bilateral: Secondary | ICD-10-CM | POA: Diagnosis present

## 2019-02-20 DIAGNOSIS — Z831 Family history of other infectious and parasitic diseases: Secondary | ICD-10-CM

## 2019-02-20 DIAGNOSIS — Z86711 Personal history of pulmonary embolism: Secondary | ICD-10-CM

## 2019-02-20 DIAGNOSIS — S0990XA Unspecified injury of head, initial encounter: Secondary | ICD-10-CM | POA: Diagnosis present

## 2019-02-20 DIAGNOSIS — I2699 Other pulmonary embolism without acute cor pulmonale: Secondary | ICD-10-CM | POA: Diagnosis present

## 2019-02-20 DIAGNOSIS — D630 Anemia in neoplastic disease: Secondary | ICD-10-CM | POA: Diagnosis present

## 2019-02-20 DIAGNOSIS — R531 Weakness: Secondary | ICD-10-CM | POA: Diagnosis not present

## 2019-02-20 DIAGNOSIS — S2239XA Fracture of one rib, unspecified side, initial encounter for closed fracture: Secondary | ICD-10-CM | POA: Diagnosis present

## 2019-02-20 DIAGNOSIS — M797 Fibromyalgia: Secondary | ICD-10-CM | POA: Diagnosis present

## 2019-02-20 DIAGNOSIS — C786 Secondary malignant neoplasm of retroperitoneum and peritoneum: Secondary | ICD-10-CM | POA: Diagnosis present

## 2019-02-20 DIAGNOSIS — D689 Coagulation defect, unspecified: Secondary | ICD-10-CM | POA: Diagnosis present

## 2019-02-20 DIAGNOSIS — N390 Urinary tract infection, site not specified: Secondary | ICD-10-CM | POA: Diagnosis not present

## 2019-02-20 DIAGNOSIS — E785 Hyperlipidemia, unspecified: Secondary | ICD-10-CM | POA: Diagnosis present

## 2019-02-20 DIAGNOSIS — Z7901 Long term (current) use of anticoagulants: Secondary | ICD-10-CM

## 2019-02-20 DIAGNOSIS — I82523 Chronic embolism and thrombosis of iliac vein, bilateral: Secondary | ICD-10-CM | POA: Diagnosis not present

## 2019-02-20 DIAGNOSIS — Z20828 Contact with and (suspected) exposure to other viral communicable diseases: Secondary | ICD-10-CM | POA: Diagnosis present

## 2019-02-20 DIAGNOSIS — S2242XA Multiple fractures of ribs, left side, initial encounter for closed fracture: Secondary | ICD-10-CM

## 2019-02-20 DIAGNOSIS — Z9882 Breast implant status: Secondary | ICD-10-CM

## 2019-02-20 DIAGNOSIS — Z923 Personal history of irradiation: Secondary | ICD-10-CM

## 2019-02-20 DIAGNOSIS — M546 Pain in thoracic spine: Secondary | ICD-10-CM | POA: Diagnosis present

## 2019-02-20 DIAGNOSIS — K219 Gastro-esophageal reflux disease without esophagitis: Secondary | ICD-10-CM | POA: Diagnosis present

## 2019-02-20 DIAGNOSIS — G893 Neoplasm related pain (acute) (chronic): Secondary | ICD-10-CM | POA: Diagnosis present

## 2019-02-20 DIAGNOSIS — D649 Anemia, unspecified: Secondary | ICD-10-CM | POA: Diagnosis present

## 2019-02-20 LAB — CBC WITH DIFFERENTIAL/PLATELET
Abs Immature Granulocytes: 0.03 10*3/uL (ref 0.00–0.07)
Basophils Absolute: 0 10*3/uL (ref 0.0–0.1)
Basophils Relative: 0 %
Eosinophils Absolute: 0 10*3/uL (ref 0.0–0.5)
Eosinophils Relative: 0 %
HCT: 42.1 % (ref 36.0–46.0)
Hemoglobin: 13.1 g/dL (ref 12.0–15.0)
Immature Granulocytes: 1 %
Lymphocytes Relative: 3 %
Lymphs Abs: 0.1 10*3/uL — ABNORMAL LOW (ref 0.7–4.0)
MCH: 28.9 pg (ref 26.0–34.0)
MCHC: 31.1 g/dL (ref 30.0–36.0)
MCV: 92.7 fL (ref 80.0–100.0)
Monocytes Absolute: 0.1 10*3/uL (ref 0.1–1.0)
Monocytes Relative: 4 %
Neutro Abs: 3 10*3/uL (ref 1.7–7.7)
Neutrophils Relative %: 92 %
Platelets: 149 10*3/uL — ABNORMAL LOW (ref 150–400)
RBC: 4.54 MIL/uL (ref 3.87–5.11)
RDW: 15 % (ref 11.5–15.5)
WBC: 3.3 10*3/uL — ABNORMAL LOW (ref 4.0–10.5)
nRBC: 0 % (ref 0.0–0.2)

## 2019-02-20 LAB — URINALYSIS, ROUTINE W REFLEX MICROSCOPIC
Bilirubin Urine: NEGATIVE
Glucose, UA: NEGATIVE mg/dL
Hgb urine dipstick: NEGATIVE
Ketones, ur: 5 mg/dL — AB
Leukocytes,Ua: NEGATIVE
Nitrite: NEGATIVE
Protein, ur: NEGATIVE mg/dL
Specific Gravity, Urine: 1.016 (ref 1.005–1.030)
pH: 6 (ref 5.0–8.0)

## 2019-02-20 LAB — COMPREHENSIVE METABOLIC PANEL
ALT: 28 U/L (ref 0–44)
AST: 44 U/L — ABNORMAL HIGH (ref 15–41)
Albumin: 2.9 g/dL — ABNORMAL LOW (ref 3.5–5.0)
Alkaline Phosphatase: 136 U/L — ABNORMAL HIGH (ref 38–126)
Anion gap: 13 (ref 5–15)
BUN: 22 mg/dL (ref 8–23)
CO2: 30 mmol/L (ref 22–32)
Calcium: 11.5 mg/dL — ABNORMAL HIGH (ref 8.9–10.3)
Chloride: 98 mmol/L (ref 98–111)
Creatinine, Ser: 0.92 mg/dL (ref 0.44–1.00)
GFR calc Af Amer: >60 mL/min (ref >60)
GFR calc non Af Amer: >60 mL/min (ref >60)
Glucose, Bld: 147 mg/dL — ABNORMAL HIGH (ref 70–99)
Potassium: 3.4 mmol/L — ABNORMAL LOW (ref 3.5–5.1)
Sodium: 141 mmol/L (ref 135–145)
Total Bilirubin: 1.1 mg/dL (ref 0.3–1.2)
Total Protein: 5.8 g/dL — ABNORMAL LOW (ref 6.5–8.1)

## 2019-02-20 LAB — SARS CORONAVIRUS 2 BY RT PCR (HOSPITAL ORDER, PERFORMED IN ~~LOC~~ HOSPITAL LAB): SARS Coronavirus 2: NEGATIVE

## 2019-02-20 LAB — CK: Total CK: 62 U/L (ref 38–234)

## 2019-02-20 LAB — PROTIME-INR
INR: >10 (ref 0.8–1.2)
Prothrombin Time: >90 seconds — ABNORMAL HIGH (ref 11.4–15.2)

## 2019-02-20 MED ORDER — IOHEXOL 300 MG/ML  SOLN
100.0000 mL | Freq: Once | INTRAMUSCULAR | Status: AC | PRN
  Administered 2019-02-20: 100 mL via INTRAVENOUS

## 2019-02-20 MED ORDER — HYDRALAZINE HCL 20 MG/ML IJ SOLN: 5.0000 mg | INTRAMUSCULAR | Status: DC | PRN

## 2019-02-20 MED ORDER — POTASSIUM CHLORIDE CRYS ER 20 MEQ PO TBCR
20.0000 meq | EXTENDED_RELEASE_TABLET | Freq: Once | ORAL | Status: AC
  Administered 2019-02-20: 23:00:00 20 meq via ORAL
  Filled 2019-02-20: qty 1

## 2019-02-20 MED ORDER — FERROUS SULFATE 325 (65 FE) MG PO TABS
325.0000 mg | ORAL_TABLET | Freq: Every day | ORAL | Status: DC
  Administered 2019-02-21 – 2019-02-22 (×2): 325 mg via ORAL
  Filled 2019-02-20 (×2): qty 1

## 2019-02-20 MED ORDER — ONDANSETRON HCL 4 MG PO TABS: 4.0000 mg | ORAL_TABLET | Freq: Four times a day (QID) | ORAL | Status: DC | PRN

## 2019-02-20 MED ORDER — FENTANYL 25 MCG/HR TD PT72
1.0000 | MEDICATED_PATCH | TRANSDERMAL | Status: DC
  Administered 2019-02-20: 23:00:00 1 via TRANSDERMAL
  Filled 2019-02-20: qty 1

## 2019-02-20 MED ORDER — ACETAMINOPHEN 650 MG RE SUPP: 650.0000 mg | Freq: Four times a day (QID) | RECTAL | Status: DC | PRN

## 2019-02-20 MED ORDER — PHYTONADIONE 5 MG PO TABS
5.0000 mg | ORAL_TABLET | Freq: Once | ORAL | Status: AC
  Administered 2019-02-20: 21:00:00 5 mg via ORAL
  Filled 2019-02-20: qty 1

## 2019-02-20 MED ORDER — METHOCARBAMOL 500 MG PO TABS
500.0000 mg | ORAL_TABLET | Freq: Three times a day (TID) | ORAL | Status: DC | PRN
  Administered 2019-02-21 – 2019-02-23 (×4): 500 mg via ORAL
  Filled 2019-02-20 (×5): qty 1

## 2019-02-20 MED ORDER — SODIUM CHLORIDE 0.9 % IV SOLN
INTRAVENOUS | Status: DC
  Administered 2019-02-20 – 2019-02-21 (×2): via INTRAVENOUS

## 2019-02-20 MED ORDER — FENTANYL CITRATE (PF) 100 MCG/2ML IJ SOLN
50.0000 ug | Freq: Once | INTRAMUSCULAR | Status: AC
  Administered 2019-02-20: 16:00:00 50 ug via INTRAVENOUS
  Filled 2019-02-20: qty 2

## 2019-02-20 MED ORDER — ACETAMINOPHEN 325 MG PO TABS: 650.0000 mg | ORAL_TABLET | Freq: Four times a day (QID) | ORAL | Status: DC | PRN

## 2019-02-20 MED ORDER — TAMOXIFEN CITRATE 10 MG PO TABS
20.0000 mg | ORAL_TABLET | Freq: Every day | ORAL | Status: DC
  Administered 2019-02-21 – 2019-02-22 (×2): 20 mg via ORAL
  Filled 2019-02-20 (×2): qty 2

## 2019-02-20 MED ORDER — ONDANSETRON HCL 4 MG/2ML IJ SOLN
4.0000 mg | Freq: Four times a day (QID) | INTRAMUSCULAR | Status: DC | PRN
  Administered 2019-02-20 – 2019-02-22 (×2): 4 mg via INTRAVENOUS
  Filled 2019-02-20 (×2): qty 2

## 2019-02-20 MED ORDER — MORPHINE SULFATE (PF) 2 MG/ML IV SOLN: 1.0000 mg | Freq: Once | INTRAVENOUS | Status: DC

## 2019-02-20 MED ORDER — PALBOCICLIB 100 MG PO TABS: 100.0000 mg | ORAL_TABLET | Freq: Every day | ORAL | Status: DC

## 2019-02-20 MED ORDER — OXYCODONE-ACETAMINOPHEN 5-325 MG PO TABS
1.0000 | ORAL_TABLET | Freq: Four times a day (QID) | ORAL | Status: DC | PRN
  Administered 2019-02-21 – 2019-02-22 (×3): 1 via ORAL
  Filled 2019-02-20 (×3): qty 1

## 2019-02-20 MED ORDER — MORPHINE SULFATE (PF) 2 MG/ML IV SOLN
1.0000 mg | INTRAVENOUS | Status: DC | PRN
  Administered 2019-02-20 – 2019-02-23 (×7): 1 mg via INTRAVENOUS
  Filled 2019-02-20 (×7): qty 1

## 2019-02-20 NOTE — ED Triage Notes (Signed)
Per [facility], Pt from home with a fall over 6 hours ago. Pt reports difficulty walking around her house recently. She has hx of metastatic cancer receiving radiation and chemo. Pt reports severe back pain after a fall. Pt reports R heel pain. Pt reports she takes blood thinners, denies LOC. She reports she did hit her head.

## 2019-02-20 NOTE — H&P (Signed)
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 18:44   Ct Cervical Spine Wo Contrast  Result Date: 02/20/2019 CLINICAL DATA:  Pain status post trauma EXAM: CT CERVICAL SPINE WITHOUT CONTRAST TECHNIQUE: Multidetector CT imaging of the cervical spine was performed without intravenous contrast. Multiplanar CT image reconstructions were also generated. COMPARISON:  None. FINDINGS: Alignment: Normal. Skull base and vertebrae: There appears to be a large partially visualized lytic lesion involving the left calvarium. This is better visualized on prior CT head. There are additional lytic lesions throughout the visualized osseous structures including at the skull base. There are extensive lytic lesions involving virtually all of the visualized cervical vertebral bodies. Soft tissues and spinal canal: No prevertebral  fluid or swelling. No visible canal hematoma. Disc levels: There is multilevel disc height loss throughout the cervical spine Upper chest: Negative. Other: None. IMPRESSION: There are extensive lytic lesions throughout the cervical spine and skull base without convincing evidence for a pathologic fracture. Electronically Signed   By: Jamie Kato.D.  On: 02/20/2019 19:00   Ct Abdomen Pelvis W Contrast  Result Date: 02/20/2019 CLINICAL DATA:  Acute pain due to trauma EXAM: CT CHEST, ABDOMEN, AND PELVIS WITH CONTRAST CT LUMBAR SPINE WITH CONTRAST. TECHNIQUE: Multidetector CT imaging of the chest, abdomen and pelvis was performed following the standard protocol during bolus administration of intravenous contrast. Technique: Multiplanar CT images of the lumbar spine were reconstructed from contemporary CT of the Abdomen and Pelvis. CONTRAST:  148mL OMNIPAQUE IOHEXOL 300 MG/ML  SOLN COMPARISON:  None. FINDINGS: CT CHEST FINDINGS Cardiovascular: The heart size is normal. There is no significant pericardial effusion. No large centrally located pulmonary embolus. The thoracic aorta is not aneurysmal. There are few question of filling defects within segmental branches of the pulmonary artery which are not well evaluated on this exam. Mediastinum/Nodes: --there are few mildly prominent mediastinal and hilar lymph nodes that remain subcentimeter in size. --No axillary lymphadenopathy. --No supraclavicular lymphadenopathy. --Normal thyroid gland. --The esophagus is unremarkable Lungs/Pleura: There is no pneumothorax. There is a small right-sided pleural effusion which is new since prior PET-CT. There is atelectasis at the lung bases. There is presumed atelectasis involving portions of the right middle lobe and right lower lobe. There is atelectasis at the left lung base. Musculoskeletal: Bilateral breast implants are noted. Diffuse osseous metastatic disease is noted involving multiple ribs bilaterally. There is  osseous metastatic disease involving the C7 vertebral body. This is only partially visualized. There is no definite pathologic fracture involving the ribs. There is a pathologic fracture involving the T8 through T11 vertebral bodies, worse at the T8 level. There are lytic lesions involving the sternum. There is extensive soft tissue destruction of the T10 vertebral body likely resulting in spinal canal narrowing. CT ABDOMEN PELVIS FINDINGS Hepatobiliary: There is a nodular surface contour of the liver. Normal gallbladder.There is no biliary ductal dilation. Pancreas: Normal contours without ductal dilatation. No peripancreatic fluid collection. Spleen: No splenic laceration or hematoma. Adrenals/Urinary Tract: --Adrenal glands: No adrenal hemorrhage. --Right kidney/ureter: There is a nonobstructing stone in the lower pole the right kidney. --Left kidney/ureter: There are nonobstructing stones involving the upper pole the left kidney. --Urinary bladder: Unremarkable. Stomach/Bowel: --Stomach/Duodenum: There is a large hiatal hernia. --Small bowel: No dilatation or inflammation. --Colon: There is a large soft tissue mass that appears to encompass much of the sigmoid colon. This mass measures approximately 13.2 x 4.5 cm. It does not appear to cause an obstruction at this time. --Appendix: Not visualized. No right lower quadrant inflammation or free fluid. Vascular/Lymphatic: Normal course and caliber of the major abdominal vessels. --there are few mildly prominent retroperitoneal lymph nodes. --there are enlarged mesenteric lymph nodes. --there is a soft tissue mass in the right hemipelvis measuring 1.1 cm which may represent a pathologically enlarged lymph node or metastatic deposit. Reproductive: There is a probable fibroid uterus. The ovaries are not clearly defined. Other: There are innumerable peritoneal/omental implants throughout the abdomen. There is a large amorphous soft tissue mass in the left hemipelvis  closely associated with the patient's sigmoid colon. There is a trace amount of free fluid in the patient's abdomen and pelvis. Musculoskeletal. 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 There may be a pathologic fracture through the L2 vertebral body with minimal height loss. There is a prominent Schmorl's node involving the inferior endplate of the L4 vertebral body. There is metastatic disease involving the pelvic bones. No definite displaced fracture through the greater trochanter of the right. IMPRESSION: CT CHEST, ABDOMEN, AND  PELVIS: 1. There are pathologic fractures involving the T8 through T11 vertebral bodies, most severe at the T8 level. There is no significant retropulsion. 2. Extensive osseous metastatic disease is noted involving the visualized osseous structures as detailed above. There is a large soft tissue component at the T10 level likely resulting in some degree of spinal canal narrowing. 3. No large centrally located pulmonary embolus. There are few questionable filling defects at the segmental subsegmental levels that are not well evaluated on this exam secondary to technique. If there is clinical concern for pulmonary embolus, consider a follow-up PE study as clinically indicated. If there are pulmonary emboli, the overall volume is likely low. 4. New small right-sided pleural effusion. Bilateral atelectasis is noted. 5. Extensive soft tissue nodules are noted throughout the peritoneal cavity/omentum, consistent with widely metastatic disease. 6. Large amorphous soft tissue mass in the patient's left hemipelvis, closely associated with the sigmoid colon. 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 A second primary lesion seems less likely but is not entirely excluded. There is no evidence for obstruction at this time. 7. No convincing evidence for a fracture through the greater trochanter of the right hip. 8.  Additional chronic findings as detailed above. CT LUMBAR SPINE: 1. There is a pathologic fracture of the L2 vertebral body with minimal height loss and minimal retropulsion. 2. 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 Electronically Signed   By: Constance Holster M.D.   On: 02/20/2019 18:44   Ct L-spine No Charge  Result Date: 02/20/2019 CLINICAL DATA:  Acute pain due to trauma EXAM: CT CHEST, ABDOMEN, AND PELVIS WITH CONTRAST CT LUMBAR SPINE WITH CONTRAST. TECHNIQUE: Multidetector CT imaging of the chest, abdomen and pelvis was performed following the standard protocol during bolus administration of intravenous contrast. Technique: Multiplanar CT images of the lumbar spine were reconstructed from contemporary CT of the Abdomen and Pelvis. CONTRAST:  164mL OMNIPAQUE IOHEXOL 300 MG/ML  SOLN COMPARISON:  None. FINDINGS: CT CHEST FINDINGS Cardiovascular: The heart size is normal. There is no significant pericardial effusion. No large centrally located pulmonary embolus. The thoracic aorta is not aneurysmal. There are few question of filling defects within segmental branches of the pulmonary artery which are not well evaluated on this exam. Mediastinum/Nodes: --there are few mildly prominent mediastinal and hilar lymph nodes that remain subcentimeter in size. --No axillary lymphadenopathy. --No supraclavicular lymphadenopathy. --Normal thyroid gland. --The esophagus is unremarkable Lungs/Pleura: There is no pneumothorax. There is a small right-sided pleural effusion which is new since prior PET-CT. There is atelectasis at the lung bases. There is presumed atelectasis involving portions of the right middle lobe and right lower lobe. There is atelectasis at the left lung base. Musculoskeletal: Bilateral breast implants are noted. Diffuse osseous metastatic disease is noted involving multiple ribs bilaterally. There is osseous metastatic disease involving the C7 vertebral body. This is  only partially visualized. There is no definite pathologic fracture involving the ribs. There is a pathologic fracture involving the T8 through T11 vertebral bodies, worse at the T8 level. There are lytic lesions involving the sternum. There is extensive soft tissue destruction of the T10 vertebral body likely resulting in spinal canal narrowing. CT ABDOMEN PELVIS FINDINGS Hepatobiliary: There is a nodular surface contour of the liver. Normal gallbladder.There is no biliary ductal dilation. Pancreas: Normal contours without ductal dilatation. No peripancreatic fluid collection. Spleen: No splenic laceration or hematoma. Adrenals/Urinary Tract: --Adrenal glands: No adrenal hemorrhage. --Right kidney/ureter:  There is a nonobstructing stone in the lower pole the right kidney. --Left kidney/ureter: There are nonobstructing stones involving the upper pole the left kidney. --Urinary bladder: Unremarkable. Stomach/Bowel: --Stomach/Duodenum: There is a large hiatal hernia. --Small bowel: No dilatation or inflammation. --Colon: There is a large soft tissue mass that appears to encompass much of the sigmoid colon. This mass measures approximately 13.2 x 4.5 cm. It does not appear to cause an obstruction at this time. --Appendix: Not visualized. No right lower quadrant inflammation or free fluid. Vascular/Lymphatic: Normal course and caliber of the major abdominal vessels. --there are few mildly prominent retroperitoneal lymph nodes. --there are enlarged mesenteric lymph nodes. --there is a soft tissue mass in the right hemipelvis measuring 1.1 cm which may represent a pathologically enlarged lymph node or metastatic deposit. Reproductive: There is a probable fibroid uterus. The ovaries are not clearly defined. Other: There are innumerable peritoneal/omental implants throughout the abdomen. There is a large amorphous soft tissue mass in the left hemipelvis closely associated with the patient's sigmoid colon. There is a trace  amount of free fluid in the patient's abdomen and pelvis. Musculoskeletal. 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 There may be a pathologic fracture through the L2 vertebral body with minimal height loss. There is a prominent Schmorl's node involving the inferior endplate of the L4 vertebral body. There is metastatic disease involving the pelvic bones. No definite displaced fracture through the greater trochanter of the right. IMPRESSION: CT CHEST, ABDOMEN, AND PELVIS: 1. There are pathologic fractures involving the T8 through T11 vertebral bodies, most severe at the T8 level. There is no significant retropulsion. 2. Extensive osseous metastatic disease is noted involving the visualized osseous structures as detailed above. There is a large soft tissue component at the T10 level likely resulting in some degree of spinal canal narrowing. 3. No large centrally located pulmonary embolus. There are few questionable filling defects at the segmental subsegmental levels that are not well evaluated on this exam secondary to technique. If there is clinical concern for pulmonary embolus, consider a follow-up PE study as clinically indicated. If there are pulmonary emboli, the overall volume is likely low. 4. New small right-sided pleural effusion. Bilateral atelectasis is noted. 5. Extensive soft tissue nodules are noted throughout the peritoneal cavity/omentum, consistent with widely metastatic disease. 6. Large amorphous soft tissue mass in the patient's left hemipelvis, closely associated with the sigmoid colon. 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 A second primary lesion seems less likely but is not entirely excluded. There is no evidence for obstruction at this time. 7. No convincing evidence for a fracture through the greater trochanter of the right hip. 8. Additional chronic findings as detailed above. CT LUMBAR SPINE: 1.  There is a pathologic fracture of the L2 vertebral body with minimal height loss and minimal retropulsion. 2. 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 Electronically Signed   By: Constance Holster M.D.   On: 02/20/2019 18:44   Dg Hips Bilat W Or Wo Pelvis 3-4 Views  Addendum Date: 02/20/2019   ADDENDUM REPORT: 02/20/2019 15:36 ADDENDUM: Scattered heterogeneous appearance of the osseous structures is likely related to the patient's known widely metastatic breast carcinoma. Electronically Signed   By: Constance Holster M.D.   On: 02/20/2019 15:36   Result Date: 02/20/2019 CLINICAL DATA:  Pain status post fall EXAM: DG HIP (WITH OR WITHOUT PELVIS) 3-4V BILAT COMPARISON:  None. FINDINGS: There  is no acute displaced fracture or dislocation. There are advanced degenerative changes of both hips, right worse than left. There is a lucency through the greater trochanter of the right hip. IMPRESSION: 1. No definite acute displaced fracture or dislocation. 2. Lucency through the greater trochanter of the right hip, only visualized on a single view. This could represent a nondisplaced fracture or artifact. Correlation with point tenderness is recommended. 3. Osteoarthritis of both hips. Electronically Signed: By: Constance Holster M.D. On: 02/20/2019 15:25     EKG: Independently reviewed.   Sinus rhythm, QTC 387, low voltage, RAD, nonspecific T wave change.   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 Images showed mildly displaced fracture of left lateral 5th and 6th rib, also showed pathologic fractures involving the T8 through T11, L2 vertebral body with minimal height loss and minimal retropulsion, a large soft tissue component at the T10 level likely resulting in some degree of spinal canal narrowing. Will focus on pain control now.  -will place on tele bed for obs -prn Percocet, morphine, Robaxin, fentanyl patch -PT/OT -Incentive spirometry RT  Chronic anemia: Hgb 13.1 -f/u by CBC  Pulmonary embolism/DVT: INR>10 -hold coumadin now  HTN:  -Continue home medications: not taking meds now. 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  -hold homd Vd3 and calcium supplement -Check vitamin D1,25, PTH and PTH-related peptid -IVF: NS at 100 cc/h.   Hypokalemia: K=3.4 on admission. - Repleted - Check Mg level  Supratherapeutic INR: INR>10. -Oral Vantin K 5 mg which is discussed with pharmacist. -daily INR    DVT ppx: none (pt has supratherapeutic INR) Code Status: DNR (I discussed with patient, and explained the meaning of CODE STATUS. Patient wants to be DNR) Family Communication: None at bed side.  Disposition Plan:  Anticipate discharge back to previous home environment Consults called:  none Admission status: Obs / tele   Date of Service 02/21/2019    [facility]   If 7PM-7AM, please contact night-coverage [email or web address] Password [email or web address] 02/21/2019, 7:21 AM

## 2019-02-20 NOTE — ED Provider Notes (Signed)
Care assumed from previous provider PA Gilbert. Please see note for further details. Case discussed, plan agreed upon. Will follow up on pending CT imaging, anticipate admission to hospital given head injury and such profoundly elevated INR.   CT's reviewed.  No acute active bleeding.  Multiple pathologic findings of her extensive metastatic disease.  No large centrally located PE, but there are a few questionable filling defects at the segmental subsegmental levels that are not fully evaluated.  Per radiology of clinical concern for PE, consider follow-up PE study as clinically indicated.  5mg  oral Vit K ordered for INR reversal per pharmacy recommendations.   Discussed with hospitalist who will admit.    Ward, Ozella Almond, PA-C 02/20/19 Kathyrn Drown    Sherwood Gambler, MD 02/20/19 [phone]

## 2019-02-20 NOTE — Progress Notes (Signed)
  Radiation Oncology         [phone] ________________________________  Name: Ciel Boeckmann MRN: [id number]  Date: 01/25/2019  DOB: December 20, 1940  SIMULATION AND TREATMENT PLANNING NOTE  DIAGNOSIS:     ICD-10-CM   1. Bone metastasis ([place])  C79.51      Site:   1.  C-spine 2.  T8 spine  NARRATIVE:  The patient was brought to the [facility].  Identity was confirmed.  All relevant records and images related to the planned course of therapy were reviewed.   Written consent to proceed with treatment was confirmed which was freely given after reviewing the details related to the planned course of therapy had been reviewed with the patient.  Then, the patient was set-up in a stable reproducible  supine position for radiation therapy.  CT images were obtained.  Surface markings were placed.    Medically necessary complex treatment device(s) for immobilization:  Thermoplastic mask/ accuform device.   The CT images were loaded into the planning software.  Then the target and avoidance structures were contoured.  Treatment planning then occurred.  The radiation prescription was entered and confirmed.  A total of 5 complex treatment devices were fabricated which relate to the designed radiation treatment fields. Each of these customized fields/ complex treatment devices will be used on a daily basis during the radiation course. I have requested : Isodose Plan.   PLAN:  The patient will receive 30 Gy in 10 fractions.  ________________________________   Jodelle Gross, MD, PhD

## 2019-02-20 NOTE — ED Notes (Signed)
Paged MD Blaine Hamper for 1-time pain med dose prior to moving pt to 6N.

## 2019-02-20 NOTE — Progress Notes (Signed)
  Radiation Oncology         [phone] ________________________________  Name: [name] MRN: [id number]  Date: 02/07/2019  DOB: February 07, 1941  SIMULATION AND TREATMENT PLANNING NOTE  DIAGNOSIS:     ICD-10-CM   1. Bone metastasis ([place])  C79.51      Site:   1.  Left pelvis 2.  Right pelves  NARRATIVE:  The patient was brought to the [facility]. She is having significant pelvic pain, and we discussed additional XRT to the above separate target areas.   Identity was confirmed.  All relevant records and images related to the planned course of therapy were reviewed.   Written consent to proceed with treatment was confirmed which was freely given after reviewing the details related to the planned course of therapy had been reviewed with the patient.  Then, the patient was set-up in a stable reproducible  supine position for radiation therapy.  CT images were obtained.  Surface markings were placed.    Medically necessary complex treatment device(s) for immobilization:  Vac-lock bag.   The CT images were loaded into the planning software.  Then the target and avoidance structures were contoured.  Treatment planning then occurred.  The radiation prescription was entered and confirmed.  A total of 4 complex treatment devices were fabricated which relate to the designed radiation treatment fields. Each of these customized fields/ complex treatment devices will be used on a daily basis during the radiation course. I have requested : Isodose Plan.   PLAN:  The patient will receive 28 Gy in 8 fractions.  ________________________________   Jodelle Gross, MD, PhD
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
## 2019-02-20 NOTE — ED Triage Notes (Signed)
PT reports hitting her head when she fell. Pt does take coumadin .

## 2019-02-20 NOTE — ED Notes (Signed)
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 pulmonary embolus, consider a follow-up PE study as clinically indicated. If there are pulmonary emboli, the overall volume is likely low. 4. New small right-sided pleural effusion. Bilateral atelectasis is noted. 5. Extensive soft tissue nodules are noted throughout the peritoneal cavity/omentum, consistent with widely metastatic disease. 6. Large amorphous soft tissue mass in the patient's left hemipelvis, closely associated with the sigmoid colon. 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 A second primary lesion seems less likely but is not entirely excluded. There is no evidence for obstruction at this time. 7. No convincing evidence for a fracture through the greater trochanter of the right hip. 8. Additional chronic findings as detailed above. CT LUMBAR SPINE: 1. There is a pathologic fracture of  the L2 vertebral body with minimal height loss and minimal retropulsion. 2. Extensive metastatic disease is noted throughout all of  the lumbar spine. Electronically Signed   By: Constance Holster M.D.   On: 02/20/2019 18:44   Ct L-spine No Charge  Result Date: 02/20/2019 CLINICAL DATA:  Acute pain due to trauma EXAM: CT CHEST, ABDOMEN, AND PELVIS WITH CONTRAST CT LUMBAR SPINE WITH CONTRAST. TECHNIQUE: Multidetector CT imaging of the chest, abdomen and pelvis was performed following the standard protocol during bolus administration of intravenous contrast. Technique: Multiplanar CT images of the lumbar spine were reconstructed from contemporary CT of the Abdomen and Pelvis. CONTRAST:  195mL OMNIPAQUE IOHEXOL 300 MG/ML  SOLN COMPARISON:  None. FINDINGS: CT CHEST FINDINGS Cardiovascular: The heart size is normal. There is no significant pericardial effusion. No large centrally located pulmonary embolus. The thoracic aorta is not aneurysmal. There are few question of filling defects within segmental branches of the pulmonary artery which are not well evaluated on this exam. Mediastinum/Nodes: --there are few mildly prominent mediastinal and hilar lymph nodes that remain subcentimeter in size. --No axillary lymphadenopathy. --No supraclavicular lymphadenopathy. --Normal thyroid gland. --The esophagus is unremarkable Lungs/Pleura: There is no pneumothorax. There is a small right-sided pleural effusion which is new since prior PET-CT. There is atelectasis at the lung bases. There is presumed atelectasis involving portions of the right middle lobe and right lower lobe. There is atelectasis at the left lung base. Musculoskeletal: Bilateral breast implants are noted. Diffuse osseous metastatic disease is noted involving multiple ribs bilaterally. There is osseous metastatic disease involving the C7 vertebral body. This is only partially visualized. There is no definite pathologic fracture involving the ribs. There is a pathologic fracture involving the T8 through T11 vertebral bodies, worse at the T8 level. There are lytic lesions involving the  sternum. There is extensive soft tissue destruction of the T10 vertebral body likely resulting in spinal canal narrowing. CT ABDOMEN PELVIS FINDINGS Hepatobiliary: There is a nodular surface contour of the liver. Normal gallbladder.There is no biliary ductal dilation. Pancreas: Normal contours without ductal dilatation. No peripancreatic fluid collection. Spleen: No splenic laceration or hematoma. Adrenals/Urinary Tract: --Adrenal glands: No adrenal hemorrhage. --Right kidney/ureter: There is a nonobstructing stone in the lower pole the right kidney. --Left kidney/ureter: There are nonobstructing stones involving the upper pole the left kidney. --Urinary bladder: Unremarkable. Stomach/Bowel: --Stomach/Duodenum: There is a large hiatal hernia. --Small bowel: No dilatation or inflammation. --Colon: There is a large soft tissue mass that appears to encompass much of the sigmoid colon. This mass measures approximately 13.2 x 4.5 cm. It does not appear to cause an obstruction at this time. --Appendix: Not visualized. No right lower quadrant inflammation or free fluid. Vascular/Lymphatic: Normal course and caliber of the major abdominal vessels. --there are few mildly prominent retroperitoneal lymph nodes. --there are enlarged mesenteric lymph nodes. --there is a soft tissue mass in the right hemipelvis measuring 1.1 cm which may represent a pathologically enlarged lymph node or metastatic deposit. Reproductive: There is a probable fibroid uterus. The ovaries are not clearly defined. Other: There are innumerable peritoneal/omental implants throughout the abdomen. There is a large amorphous soft tissue mass in the left hemipelvis closely associated with the patient's sigmoid colon. There is a trace amount of free fluid in the patient's abdomen and pelvis. Musculoskeletal. 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 There may be a pathologic fracture through the L2 vertebral body with minimal  height loss. There is a prominent Schmorl's  node involving the inferior endplate of the L4 vertebral body. There is metastatic disease involving the pelvic bones. No definite displaced fracture through the greater trochanter of the right. IMPRESSION: CT CHEST, ABDOMEN, AND PELVIS: 1. There are pathologic fractures involving the T8 through T11 vertebral bodies, Arroyo severe at the T8 level. There is no significant retropulsion. 2. Extensive osseous metastatic disease is noted involving the visualized osseous structures as detailed above. There is a large soft tissue component at the T10 level likely resulting in some degree of spinal canal narrowing. 3. No large centrally located pulmonary embolus. There are few questionable filling defects at the segmental subsegmental levels that are not well evaluated on this exam secondary to technique. If there is clinical concern for pulmonary embolus, consider a follow-up PE study as clinically indicated. If there are pulmonary emboli, the overall volume is likely low. 4. New small right-sided pleural effusion. Bilateral atelectasis is noted. 5. Extensive soft tissue nodules are noted throughout the peritoneal cavity/omentum, consistent with widely metastatic disease. 6. Large amorphous soft tissue mass in the patient's left hemipelvis, closely associated with the sigmoid colon. 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 A second primary lesion seems less likely but is not entirely excluded. There is no evidence for obstruction at this time. 7. No convincing evidence for a fracture through the greater trochanter of the right hip. 8. Additional chronic findings as detailed above. CT LUMBAR SPINE: 1. There is a pathologic fracture of the L2 vertebral body with minimal height loss and minimal retropulsion. 2. 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 Electronically Signed   By: Constance Holster M.D.   On: 02/20/2019 18:44   Dg Hips Bilat W Or Wo Pelvis 3-4 Views  Addendum Date: 02/20/2019   ADDENDUM REPORT: 02/20/2019 15:36 ADDENDUM: Scattered heterogeneous appearance of the osseous structures is likely related to the patient's known widely metastatic breast carcinoma. Electronically Signed   By: Constance Holster M.D.   On: 02/20/2019 15:36   Result Date: 02/20/2019 CLINICAL DATA:  Pain status post fall EXAM: DG HIP (WITH OR WITHOUT PELVIS) 3-4V BILAT COMPARISON:  None. FINDINGS: There is no acute displaced fracture or dislocation. There are advanced degenerative changes of both hips, right worse than left. There is a lucency through the greater trochanter of the right hip. IMPRESSION: 1. No definite acute displaced fracture or dislocation. 2. Lucency through the greater trochanter of the right hip, only visualized on a single view. This could represent a nondisplaced fracture or artifact. Correlation with point tenderness is recommended. 3. Osteoarthritis of both hips. Electronically Signed: By: Constance Holster M.D. On: 02/20/2019 15:25    Pending Labs Unresulted Labs (From admission, onward)    Start     Ordered   02/20/19 1929  SARS Coronavirus 2 [facility] order, Performed in [facility] hospital lab) Nasopharyngeal Nasopharyngeal Swab  (Symptomatic/High Risk of Exposure/Tier 1 Patients Labs with Precautions)  Once,   STAT    Question Answer Comment  Is this test for diagnosis or screening Diagnosis of ill patient   Symptomatic for COVID-19 as defined by CDC Yes   Date of Symptom Onset 02/20/2019   Hospitalized for COVID-19 Yes   Admitted to ICU for COVID-19 No   Previously tested for COVID-19 No   Resident in a congregate (group) care setting No   Employed in healthcare setting No   Pregnant No      02/20/19 1928  02/20/19 1421  Urine culture  ONCE - STAT,   STAT     02/20/19 1420   Signed and Held  Protime-INR  Daily,   R     Signed and Held   Signed and Held   Basic metabolic panel  Tomorrow morning,   R     Signed and Held   Signed and Held  CBC  Tomorrow morning,   R     Signed and Held   Signed and Held  PTH-related peptide  Once,   R     Signed and Held   Signed and Held  Parathyroid hormone, intact (no Ca)  Once,   R     Signed and Held   Signed and Held  Calcitriol (1,25 di-OH Vit D)  Once,   R     Signed and Held   Signed and Held  Magnesium  Tomorrow morning,   R     Signed and Held          Vitals/Pain Today's Vitals   02/20/19 1600 02/20/19 1713 02/20/19 1715 02/20/19 2030  BP: (!) 153/80 (!) 145/76 132/85 137/77  Pulse:  (!) 111 (!) 110 (!) 115  Resp:  20 19 20   Temp:      TempSrc:      SpO2:  94% 93% 90%  PainSc:  0-No pain      Isolation Precautions No active isolations  Medications Medications  morphine 2 MG/ML injection 1 mg (has no administration in time range)  methocarbamol (ROBAXIN) tablet 500 mg (has no administration in time range)  hydrALAZINE (APRESOLINE) injection 5 mg (has no administration in time range)  fentaNYL (SUBLIMAZE) injection 50 mcg (50 mcg Intravenous Given 02/20/19 1606)  iohexol (OMNIPAQUE) 300 MG/ML solution 100 mL (100 mLs Intravenous Contrast Given 02/20/19 1747)  phytonadione (VITAMIN K) tablet 5 mg (5 mg Oral Given 02/20/19 2101)    Mobility non-ambulatory Low fall risk   Focused Assessments Cardiac Assessment Handoff:    Lab Results  Component Value Date   CKTOTAL 62 02/20/2019   TROPONINI <0.03 10/12/2014   Lab Results  Component Value Date   DDIMER 3.59 (H) 09/27/2014   Does the Patient currently have chest pain? No     R Recommendations: See Admitting Provider Note  Report given to:   Additional Notes: none

## 2019-02-20 NOTE — Telephone Encounter (Signed)
RN placed call to follow up with on-call message that was received.   No answer, no option to leave voicemail.  Per on-call message, pt will keep appointment today.

## 2019-02-21 ENCOUNTER — Ambulatory Visit: Payer: Medicare Other

## 2019-02-21 DIAGNOSIS — S2239XA Fracture of one rib, unspecified side, initial encounter for closed fracture: Secondary | ICD-10-CM | POA: Diagnosis present

## 2019-02-21 DIAGNOSIS — I10 Essential (primary) hypertension: Secondary | ICD-10-CM | POA: Diagnosis present

## 2019-02-21 DIAGNOSIS — Z86711 Personal history of pulmonary embolism: Secondary | ICD-10-CM | POA: Diagnosis not present

## 2019-02-21 DIAGNOSIS — K219 Gastro-esophageal reflux disease without esophagitis: Secondary | ICD-10-CM | POA: Diagnosis present

## 2019-02-21 DIAGNOSIS — Z20828 Contact with and (suspected) exposure to other viral communicable diseases: Secondary | ICD-10-CM | POA: Diagnosis present

## 2019-02-21 DIAGNOSIS — M797 Fibromyalgia: Secondary | ICD-10-CM | POA: Diagnosis present

## 2019-02-21 DIAGNOSIS — C50911 Malignant neoplasm of unspecified site of right female breast: Secondary | ICD-10-CM

## 2019-02-21 DIAGNOSIS — E876 Hypokalemia: Secondary | ICD-10-CM | POA: Diagnosis present

## 2019-02-21 DIAGNOSIS — I2782 Chronic pulmonary embolism: Secondary | ICD-10-CM | POA: Diagnosis not present

## 2019-02-21 DIAGNOSIS — C7951 Secondary malignant neoplasm of bone: Secondary | ICD-10-CM | POA: Diagnosis present

## 2019-02-21 DIAGNOSIS — M546 Pain in thoracic spine: Secondary | ICD-10-CM | POA: Diagnosis present

## 2019-02-21 DIAGNOSIS — W19XXXA Unspecified fall, initial encounter: Secondary | ICD-10-CM | POA: Diagnosis not present

## 2019-02-21 DIAGNOSIS — C786 Secondary malignant neoplasm of retroperitoneum and peritoneum: Secondary | ICD-10-CM | POA: Diagnosis present

## 2019-02-21 DIAGNOSIS — E7800 Pure hypercholesterolemia, unspecified: Secondary | ICD-10-CM | POA: Diagnosis present

## 2019-02-21 DIAGNOSIS — D649 Anemia, unspecified: Secondary | ICD-10-CM | POA: Diagnosis not present

## 2019-02-21 DIAGNOSIS — G893 Neoplasm related pain (acute) (chronic): Secondary | ICD-10-CM | POA: Diagnosis present

## 2019-02-21 DIAGNOSIS — W19XXXD Unspecified fall, subsequent encounter: Secondary | ICD-10-CM | POA: Diagnosis not present

## 2019-02-21 DIAGNOSIS — S2242XA Multiple fractures of ribs, left side, initial encounter for closed fracture: Secondary | ICD-10-CM | POA: Diagnosis present

## 2019-02-21 DIAGNOSIS — D630 Anemia in neoplastic disease: Secondary | ICD-10-CM | POA: Diagnosis present

## 2019-02-21 DIAGNOSIS — D689 Coagulation defect, unspecified: Secondary | ICD-10-CM | POA: Diagnosis present

## 2019-02-21 DIAGNOSIS — W1830XA Fall on same level, unspecified, initial encounter: Secondary | ICD-10-CM | POA: Diagnosis present

## 2019-02-21 DIAGNOSIS — M8458XA Pathological fracture in neoplastic disease, other specified site, initial encounter for fracture: Secondary | ICD-10-CM | POA: Diagnosis present

## 2019-02-21 DIAGNOSIS — C50511 Malignant neoplasm of lower-outer quadrant of right female breast: Secondary | ICD-10-CM | POA: Diagnosis present

## 2019-02-21 DIAGNOSIS — S2249XA Multiple fractures of ribs, unspecified side, initial encounter for closed fracture: Secondary | ICD-10-CM | POA: Diagnosis present

## 2019-02-21 DIAGNOSIS — R531 Weakness: Secondary | ICD-10-CM | POA: Diagnosis present

## 2019-02-21 DIAGNOSIS — N390 Urinary tract infection, site not specified: Secondary | ICD-10-CM | POA: Diagnosis not present

## 2019-02-21 DIAGNOSIS — Z66 Do not resuscitate: Secondary | ICD-10-CM | POA: Diagnosis present

## 2019-02-21 DIAGNOSIS — S0990XA Unspecified injury of head, initial encounter: Secondary | ICD-10-CM | POA: Diagnosis present

## 2019-02-21 DIAGNOSIS — E785 Hyperlipidemia, unspecified: Secondary | ICD-10-CM | POA: Diagnosis present

## 2019-02-21 DIAGNOSIS — Z7901 Long term (current) use of anticoagulants: Secondary | ICD-10-CM | POA: Diagnosis not present

## 2019-02-21 DIAGNOSIS — I82523 Chronic embolism and thrombosis of iliac vein, bilateral: Secondary | ICD-10-CM | POA: Diagnosis not present

## 2019-02-21 DIAGNOSIS — B9620 Unspecified Escherichia coli [E. coli] as the cause of diseases classified elsewhere: Secondary | ICD-10-CM | POA: Diagnosis present

## 2019-02-21 DIAGNOSIS — M549 Dorsalgia, unspecified: Secondary | ICD-10-CM | POA: Diagnosis present

## 2019-02-21 LAB — BASIC METABOLIC PANEL
Anion gap: 10 (ref 5–15)
BUN: 19 mg/dL (ref 8–23)
CO2: 30 mmol/L (ref 22–32)
Calcium: 10.7 mg/dL — ABNORMAL HIGH (ref 8.9–10.3)
Chloride: 102 mmol/L (ref 98–111)
Creatinine, Ser: 0.87 mg/dL (ref 0.44–1.00)
GFR calc Af Amer: >60 mL/min (ref >60)
GFR calc non Af Amer: >60 mL/min (ref >60)
Glucose, Bld: 157 mg/dL — ABNORMAL HIGH (ref 70–99)
Potassium: 3.4 mmol/L — ABNORMAL LOW (ref 3.5–5.1)
Sodium: 142 mmol/L (ref 135–145)

## 2019-02-21 LAB — CBC
HCT: 35.1 % — ABNORMAL LOW (ref 36.0–46.0)
Hemoglobin: 11 g/dL — ABNORMAL LOW (ref 12.0–15.0)
MCH: 28.7 pg (ref 26.0–34.0)
MCHC: 31.3 g/dL (ref 30.0–36.0)
MCV: 91.6 fL (ref 80.0–100.0)
Platelets: 128 10*3/uL — ABNORMAL LOW (ref 150–400)
RBC: 3.83 MIL/uL — ABNORMAL LOW (ref 3.87–5.11)
RDW: 15.3 % (ref 11.5–15.5)
WBC: 3 10*3/uL — ABNORMAL LOW (ref 4.0–10.5)
nRBC: 0 % (ref 0.0–0.2)

## 2019-02-21 LAB — PROTIME-INR
INR: >10 (ref 0.8–1.2)
INR: >10 (ref 0.8–1.2)
Prothrombin Time: >90 seconds — ABNORMAL HIGH (ref 11.4–15.2)
Prothrombin Time: >90 seconds — ABNORMAL HIGH (ref 11.4–15.2)

## 2019-02-21 LAB — MAGNESIUM: Magnesium: 1.9 mg/dL (ref 1.7–2.4)

## 2019-02-21 MED ORDER — FENTANYL 50 MCG/HR TD PT72
1.0000 | MEDICATED_PATCH | TRANSDERMAL | Status: DC
  Administered 2019-02-21: 1 via TRANSDERMAL
  Filled 2019-02-21: qty 1

## 2019-02-21 MED ORDER — VITAMIN K1 10 MG/ML IJ SOLN
5.0000 mg | Freq: Once | INTRAVENOUS | Status: AC
  Administered 2019-02-21: 13:00:00 5 mg via INTRAVENOUS
  Filled 2019-02-21: qty 0.5

## 2019-02-21 NOTE — Progress Notes (Signed)
HEMATOLOGY-ONCOLOGY PROGRESS NOTE  SUBJECTIVE: Metastatic breast cancer with widespread bone metastases with pathologic fractures status post radiation therapy to the thoracic spine and pelvis and a history of PE on Coumadin who presented to the emergency room with several falls lately.  She was on the toilet for 17 hours because she did not have the energy to get up.  Another episode she was on the ground for 4-5 hours.  She currently rates her pain is 10 out of 10 she was found to have rib fractures in addition to the vertebral fractures.  She was recently started on Ibrance.  Her INR was 10. We were consulted to discuss goals of care.  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   06/09/2016 Imaging   MRI Abdomen: No pancreatic mass; enhancement of T 12 vertebral body, enh lesions L2 and L4 vert bodies, susp of bone mets, diffuse hep steatosis   06/28/2016 PET scan   T12 and L2 Vert bodies are hypermetabolic sugg of metastatic disease. L4 vert body doesnot have hypermet activitiy. Two other faint activity along Ant sup iliac crest and Rt scapular tip (?inflammatory).   07/22/2016 -  Anti-estrogen oral therapy   Letrozole 2.5 mg daily   12/23/2016 Imaging   Bone scan: No evidence of skeletal metastases. It may be because these lesions are below the inability  of bone scan to detect them previously that were noted on PET scan and MRI   09/29/2017 PET scan   No evidence of any hypermetabolic activity.   01/23/2019 PET scan   New widespread hypermetabolic skeletal metastasis involving the femurs, pelvis, spine, ribs and shoulder girdles. No evidence soft tissue metastasis.   01/30/2019 -  Radiation Therapy   Palliative radiation to lower back   02/07/2019 Treatment Plan Change   Ibrance with tamoxifen     OBJECTIVE: REVIEW OF SYSTEMS:   Severe intractable back as well as abdominal discomfort as well.  PHYSICAL EXAMINATION: ECOG PERFORMANCE STATUS: 4 - Bedbound  Vitals:   02/21/19 0444 02/21/19 1445  BP: (!) 152/83 134/64  Pulse: (!) 104 98  Resp: 16 20  Temp: 97.8 F (36.6 C) 98 F (36.7 C)  SpO2: (!) 89% 92%   Filed Weights   02/20/19 2227  Weight: 163 lb 5.8 oz (74.1 kg)    GENERAL:alert, no distress and comfortable SKIN: skin color, texture, turgor are normal, no rashes or significant lesions EYES: normal, Conjunctiva are pink and non-injected, sclera clear OROPHARYNX:no exudate, no erythema and lips, buccal mucosa, and tongue normal  NECK: supple, thyroid normal size, non-tender, without nodularity LYMPH:  no palpable lymphadenopathy in the cervical, axillary or inguinal LUNGS: clear to auscultation and percussion with normal breathing effort HEART: regular rate & rhythm and no murmurs and no lower extremity edema ABDOMEN:abdomen soft, non-tender and normal bowel sounds Musculoskeletal:no cyanosis of digits and no clubbing  NEURO: alert &  oriented x 3 with fluent speech, severe lower extremity weakness.  She also had upper extremity weakness  LABORATORY DATA:  I have reviewed the data as listed CMP Latest Ref Rng & Units 02/21/2019 02/20/2019 04/03/2018  Glucose 70 - 99 mg/dL 157(H) 147(H) 120(H)  BUN 8 - 23 mg/dL 19 22 10   Creatinine 0.44 - 1.00 mg/dL 0.87 0.92 0.94  Sodium 135 - 145 mmol/L 142 141 145  Potassium 3.5 -  5.1 mmol/L 3.4(L) 3.4(L) 3.9  Chloride 98 - 111 mmol/L 102 98 105  CO2 22 - 32 mmol/L 30 30 31   Calcium 8.9 - 10.3 mg/dL 10.7(H) 11.5(H) 10.0  Total Protein 6.5 - 8.1 g/dL - 5.8(L) 6.7  Total Bilirubin 0.3 - 1.2 mg/dL - 1.1 0.7  Alkaline Phos 38 - 126 U/L - 136(H) 79  AST 15 - 41 U/L - 44(H) 19  ALT 0 - 44 U/L - 28 13    Lab Results  Component Value Date   WBC 3.0 (L) 02/21/2019   HGB 11.0 (L) 02/21/2019   HCT 35.1 (L) 02/21/2019   MCV 91.6 02/21/2019   PLT 128 (L) 02/21/2019   NEUTROABS 3.0 02/20/2019    ASSESSMENT AND PLAN: 1.  Widespread metastatic breast cancer with extensive bone metastases as well as peritoneal carcinomatosis. I discussed with the patient that her disease is been relentlessly progressing in spite of antiestrogen therapy. Even though [name] was only started recently, I do not believe that it will have a meaningful impact on her quality of life or even longevity.  After much discussion I recommended hospice care.  Patient is in agreement.  She cannot be home with hospice because of lack of support at home.  She will need to be in an inpatient facility for hospice care.  I discussed with radiation oncology and there is no role of additional radiation because it has not helped when it was done previously.  I also spoke to the patient's son who is also in agreement with the plan. I discussed the case with Dr. Broadus John who will discuss with social workers regarding her options. 2.  Intractable pain: On pain medication.  DNR CC Discontinue Ibrance and pursue hospice

## 2019-02-21 NOTE — Progress Notes (Signed)
Physical Therapy Evaluation Patient Details Name: [name] MRN: [id number] DOB: [birth year] Today's Date: 02/21/2019   History of Present Illness  Pt is a 78 y.o. female admitted 02/20/19 after fall at home due to legs giving out, had to lay on ground for 6 hrs until daughter found her. CXR with L 5-6th rib fx. Head CT without intracranial trauma; aggressive lytic lesion in L temporal lobe. Chest/abdomen/lumbar CT showed extensive osseous metastatic disease, fxs of T8-11, new R-side pleural effusion. PMH includes breast cancer s/p radiation, HTN, fibromyalgia.    Clinical Impression  Pt presents with an overall decrease in functional mobility secondary to above. PTA, pt mod indep with quad cane and lives alone; pt's husband lives with son who provides care for him, pt also plans to d/c to son's home so son can assist her as well. Today, pt unable to sit EOB due to pain with all mobility, maxA for repositioning in supine. Increased time spent discussing importance of mobility, especially with regards to pulmonary function; pt stating, "not today." Pt would benefit from continued acute PT services to maximize functional mobility and independence prior to d/c with SNF-level therapies pending pt progression.     Follow Up Recommendations SNF;Supervision for mobility/OOB    Equipment Recommendations  (TBD)    Recommendations for Other Services       Precautions / Restrictions Precautions Precautions: Fall Precaution Comments: Rib fxs Restrictions Weight Bearing Restrictions: No      Mobility  Bed Mobility Overal bed mobility: Needs Assistance Bed Mobility: Rolling           General bed mobility comments: Minimal attempt at rolling with preparation to sit EOB, pt ultimately deferring attempt for mobility due to pain in back, ribs, legs; expect assist +2 due to pain. MaxA for repositioning BLEs in supine  Transfers                    Ambulation/Gait                 Stairs            Wheelchair Mobility    Modified Rankin (Stroke Patients Only)       Balance                                             Pertinent Vitals/Pain Pain Assessment: Faces Faces Pain Scale: Hurts whole lot Pain Location: Back, ribs, all over Pain Descriptors / Indicators: Sore;Guarding;Grimacing;Moaning Pain Intervention(s): Limited activity within patient's tolerance;Premedicated before session    Home Living Family/patient expects to be discharged to:: Private residence Living Arrangements: Alone Available Help at Discharge: Family;Available 24 hours/day Type of Home: House Home Access: Level entry     Home Layout: One level Home Equipment: Cane - quad Additional Comments: Reports she plans to d/c to son's home and live there permanently (home set-up info is for son's home)    Prior Function Level of Independence: Independent with assistive device(s)         Comments: Mod indep with quad cane. Endorses two recent falls down for multiple hours until found by family member; does not have life alert     Hand Dominance        Extremity/Trunk Assessment   Upper Extremity Assessment Upper Extremity Assessment: Generalized weakness    Lower Extremity Assessment Lower Extremity Assessment: Generalized weakness(difficult to assess  as pt painful with all movement)       Communication   Communication: No difficulties  Cognition Arousal/Alertness: Awake/alert Behavior During Therapy: Flat affect Overall Cognitive Status: No family/caregiver present to determine baseline cognitive functioning                                 General Comments: Seems WFL for simple tasks, although difficult to reason with pt regarding importance of mobility as she is distracted by pain      General Comments General comments (skin integrity, edema, etc.): Max education/encouragement on importance of mobility, especially regarding  pulmonary function; pt stating "not today." Pt with productive cough    Exercises     Assessment/Plan    PT Assessment Patient needs continued PT services  PT Problem List Decreased strength;Decreased range of motion;Decreased activity tolerance;Decreased balance;Decreased mobility;Decreased knowledge of use of DME;Pain       PT Treatment Interventions DME instruction;Gait training;Stair training;Functional mobility training;Therapeutic activities;Therapeutic exercise;Balance training;Patient/family education    PT Goals (Current goals can be found in the Care Plan section)  Acute Rehab PT Goals Patient Stated Goal: To not move because in too much pain PT Goal Formulation: With patient Time For Goal Achievement: 03/07/19 Potential to Achieve Goals: Good    Frequency Min 3X/week   Barriers to discharge        Co-evaluation               AM-PAC PT "6 Clicks" Mobility  Outcome Measure Help needed turning from your back to your side while in a flat bed without using bedrails?: A Lot Help needed moving from lying on your back to sitting on the side of a flat bed without using bedrails?: A Lot Help needed moving to and from a bed to a chair (including a wheelchair)?: A Lot Help needed standing up from a chair using your arms (e.g., wheelchair or bedside chair)?: A Lot Help needed to walk in hospital room?: A Lot Help needed climbing 3-5 steps with a railing? : A Lot 6 Click Score: 12    End of Session   Activity Tolerance: Patient limited by pain Patient left: in bed;with call bell/phone within reach;with bed alarm set Nurse Communication: Mobility status PT Visit Diagnosis: Other abnormalities of gait and mobility (R26.89);Pain;Repeated falls (R29.6)    Time: [id number] PT Time Calculation (min) (ACUTE ONLY): 20 min   Charges:   PT Evaluation $PT Eval Moderate Complexity: [place], PT, DPT Acute Rehabilitation Services  Pager  [phone] Office [phone]  Derry Lory 02/21/2019, 12:37 PM

## 2019-02-21 NOTE — Progress Notes (Signed)
CRITICAL VALUE ALERT  Critical Value:  inr >10  Date & Time Notied: 02/21/2019 3:30  Provider Notified: yes  Orders Received/Actions taken: awaiting

## 2019-02-21 NOTE — Progress Notes (Signed)
Patient refusing to be repositioned, explain the consequences of just laying in bed and patient says not to touch her.

## 2019-02-21 NOTE — Progress Notes (Addendum)
PROGRESS NOTE    [name]  [id number] DOB: 04-20-1941 DOA: 02/20/2019 PCP: Kelton Pillar, MD  Brief Narrative: 78 year old female with metastatic breast cancer, extensive bony metastasis, recently treated with XRT to thoracic spine and pelvis, history of PE/DVT on Coumadin, fibromyalgia presented to the emergency room yesterday  following a mechanical fall, suffered worsening pain in her upper back radiating all the way across to the front of her chest, ribs, subsequently had to lay in the bathroom for approximately 6 hours until her daughter found her, in the ER she was noted to have an INR greater than 10, COVID PCR was negative, vital signs were stable, x-ray showed mildly displaced left fifth and sixth rib fractures, CT head showed aggressive lytic lesion in left temporal lobe, calvarium and sphenoid bone, CT speech C-spine showed extensive lytic lesions throughout the cervical spine and skull base -CT of the chest abdomen pelvis and L-spine showed extensive osseous metastatic disease, pathological fractures involving T8-T11, L2, large soft tissue mass at T10 causing spinal canal narrowing  Assessment & Plan:   Extensive bony metastasis with pathological vertebral fractures, T8-T11, L2 -And large soft tissue mass at T10 -Just recently completed XRT to thoracic spine and pelvis in August -Followed by Dr. Sonny Dandy oncology and Dr. Lisbeth Renshaw - XRT -Continue fentanyl patch, increased dose to 50 mcg,, Percocet as needed, add Robaxin -Doubt kyphoplasty will be an option in the setting of extensive lytic involvement of the spine -Physical therapy  Metastatic breast cancer -As above, continue tamoxifen, on Ibrance at home, currently on hold-patient request  Hypercalcemia of malignancy -Improving with hydration, monitor, will consider bisphosphonate if trends back up again  Coagulopathy/supratherapeutic INR greater than 10 -Etiology is unclear -Coumadin on hold, unclear if [name] is  affecting this -Vitamin K IV x1 now, monitor INR daily  Chronic anemia -Stable, monitor  History of DVT/PE -Supratherapeutic INR greater than 10 -Coumadin on hold, vitamin K IV now  Rib fractures -Supportive care, incentive spirometer  DVT prophylaxis: Supratherapeutic INR Code Status: DNR Family Communication: No family at bedside discussed with patient in detail Disposition Plan: To be determined  Consultants:   Oncology   Procedures:   Antimicrobials:    Subjective: -Continues to have severe pain upper back radiating to the anterior chest  Objective: Vitals:   02/20/19 2030 02/20/19 2227 02/21/19 0444 02/21/19 1445  BP: 137/77 (!) 144/67 (!) 152/83 134/64  Pulse: (!) 115 (!) 115 (!) 104 98  Resp: 20 18 16 20   Temp:  98.1 F (36.7 C) 97.8 F (36.6 C) 98 F (36.7 C)  TempSrc:  Oral Oral Oral  SpO2: 90% 94% (!) 89% 92%  Weight:  74.1 kg    Height:  5\' 3"  (1.6 m)      Intake/Output Summary (Last 24 hours) at 02/21/2019 1531 Last data filed at 02/21/2019 1446 Gross per 24 hour  Intake 120 ml  Output 800 ml  Net -680 ml   Filed Weights   02/20/19 2227  Weight: 74.1 kg    Examination:  General exam: Chronically ill-appearing, uncomfortable, AAO x3 Respiratory system: Decreased breath sounds at both bases  cardiovascular system: S1 & S2 heard, RRR.  Central nervous system: Alert and oriented. No focal neurological deficits. Extremities: No edema, moves all extremities Skin: No rashes, lesions or ulcers Psychiatry: Judgement and insight appear normal. Mood & affect appropriate.     Data Reviewed:   CBC: Recent Labs  Lab 02/20/19 1311 02/21/19 0229  WBC 3.3* 3.0*  NEUTROABS 3.0  --  HGB 13.1 11.0*  HCT 42.1 35.1*  MCV 92.7 91.6  PLT 149* 0000000*   Basic Metabolic Panel: Recent Labs  Lab 02/20/19 1311 02/21/19 0229  NA 141 142  K 3.4* 3.4*  CL 98 102  CO2 30 30  GLUCOSE 147* 157*  BUN 22 19  CREATININE 0.92 0.87  CALCIUM 11.5* 10.7*    MG  --  1.9   GFR: Estimated Creatinine Clearance: 52.2 mL/min (by C-G formula based on SCr of 0.87 mg/dL). Liver Function Tests: Recent Labs  Lab 02/20/19 1311  AST 44*  ALT 28  ALKPHOS 136*  BILITOT 1.1  PROT 5.8*  ALBUMIN 2.9*   No results for input(s): LIPASE, AMYLASE in the last 168 hours. No results for input(s): AMMONIA in the last 168 hours. Coagulation Profile: Recent Labs  Lab 02/20/19 1311 02/20/19 1532 02/21/19 0229  INR >10.0* >10.0* >10.0*   Cardiac Enzymes: Recent Labs  Lab 02/20/19 1600  CKTOTAL 62   BNP (last 3 results) No results for input(s): PROBNP in the last 8760 hours. HbA1C: No results for input(s): HGBA1C in the last 72 hours. CBG: No results for input(s): GLUCAP in the last 168 hours. Lipid Profile: No results for input(s): CHOL, HDL, LDLCALC, TRIG, CHOLHDL, LDLDIRECT in the last 72 hours. Thyroid Function Tests: No results for input(s): TSH, T4TOTAL, FREET4, T3FREE, THYROIDAB in the last 72 hours. Anemia Panel: No results for input(s): VITAMINB12, FOLATE, FERRITIN, TIBC, IRON, RETICCTPCT in the last 72 hours. Urine analysis:    Component Value Date/Time   COLORURINE YELLOW 02/20/2019 1732   APPEARANCEUR CLEAR 02/20/2019 1732   LABSPEC 1.016 02/20/2019 1732   LABSPEC 1.005 02/04/2014 1311   PHURINE 6.0 02/20/2019 1732   GLUCOSEU NEGATIVE 02/20/2019 1732   GLUCOSEU Negative 01/24/2014 1311   HGBUR NEGATIVE 02/20/2019 1732   BILIRUBINUR NEGATIVE 02/20/2019 1732   BILIRUBINUR Negative 02/09/2014 1311   KETONESUR 5 (A) 02/20/2019 1732   PROTEINUR NEGATIVE 02/20/2019 1732   UROBILINOGEN 0.2 10/19/2014 0429   UROBILINOGEN 0.2 02/10/2014 1311   NITRITE NEGATIVE 02/20/2019 1732   LEUKOCYTESUR NEGATIVE 02/20/2019 1732   LEUKOCYTESUR Moderate 01/20/2014 1311   Sepsis Labs: @LABRCNTIP (procalcitonin:4,lacticidven:4)  ) Recent Results (from the past 240 hour(s))  Urine culture     Status: Abnormal (Preliminary result)   Collection  Time: 02/20/19  2:21 PM   Specimen: Urine, Random  Result Value Ref Range Status   Specimen Description URINE, RANDOM  Final   Special Requests NONE  Final   Culture (A)  Final    80,000 COLONIES/mL GRAM NEGATIVE RODS IDENTIFICATION AND SUSCEPTIBILITIES TO FOLLOW Performed at [facility], [address], [place], [place] [address]    Report Status PENDING  Incomplete  SARS Coronavirus 2 [facility] order, Performed in [facility] lab) Nasopharyngeal Nasopharyngeal Swab     Status: None   Collection Time: 02/20/19  9:44 PM   Specimen: Nasopharyngeal Swab  Result Value Ref Range Status   SARS Coronavirus 2 NEGATIVE NEGATIVE Final    Comment: (NOTE) If result is NEGATIVE SARS-CoV-2 target nucleic acids are NOT DETECTED. The SARS-CoV-2 RNA is generally detectable in upper and lower  respiratory specimens during the acute phase of infection. The lowest  concentration of SARS-CoV-2 viral copies this assay can detect is 250  copies / mL. A negative result does not preclude SARS-CoV-2 infection  and should not be used as the sole basis for treatment or other  patient management decisions.  A negative result may occur with  improper specimen collection / handling, submission of specimen other  than nasopharyngeal swab, presence of viral mutation(s) within the  areas targeted by this assay, and inadequate number of viral copies  (<250 copies / mL). A negative result must be combined with clinical  observations, patient history, and epidemiological information. If result is POSITIVE SARS-CoV-2 target nucleic acids are DETECTED. The SARS-CoV-2 RNA is generally detectable in upper and lower  respiratory specimens dur ing the acute phase of infection.  Positive  results are indicative of active infection with SARS-CoV-2.  Clinical  correlation with patient history and other diagnostic information is  necessary to determine patient infection status.  Positive results do  not rule  out bacterial infection or co-infection with other viruses. If result is PRESUMPTIVE POSTIVE SARS-CoV-2 nucleic acids MAY BE PRESENT.   A presumptive positive result was obtained on the submitted specimen  and confirmed on repeat testing.  While 2019 novel coronavirus  (SARS-CoV-2) nucleic acids may be present in the submitted sample  additional confirmatory testing may be necessary for epidemiological  and / or clinical management purposes  to differentiate between  SARS-CoV-2 and other Sarbecovirus currently known to infect humans.  If clinically indicated additional testing with an alternate test  methodology [phone]) is advised. The SARS-CoV-2 RNA is generally  detectable in upper and lower respiratory sp ecimens during the acute  phase of infection. The expected result is Negative. Fact Sheet for Patients:  StrictlyIdeas.no Fact Sheet for Healthcare Providers: BankingDealers.co.za This test is not yet approved or cleared by the [place] FDA and has been authorized for detection and/or diagnosis of SARS-CoV-2 by FDA under an Emergency Use Authorization (EUA).  This EUA will remain in effect (meaning this test can be used) for the duration of the COVID-19 declaration under Section 564(b)(1) of the Act, 21 U.S.C. section 360bbb-3(b)(1), unless the authorization is terminated or revoked sooner. Performed at [facility], [place] [address] [place], [place] [address]          Radiology Studies: Dg Chest 1 View  Result Date: 02/20/2019 CLINICAL DATA:  Pain status post fall EXAM: CHEST  1 VIEW COMPARISON:  CT dated 10/14/2014 FINDINGS: Heart size is mildly enlarged. Aortic calcifications are noted. There is scarring versus atelectasis in the right lower lung zone. There is no pneumothorax. There appear to be mildly displaced fractures involving the fifth and sixth ribs anterior laterally on the left. There are old healed  right-sided rib fractures. IMPRESSION: 1. Findings suspicious for mildly displaced fractures involving the fifth and sixth ribs laterally on the left. 2. No pneumothorax. 3. Linear opacity involving the right lower lung zone, favored to represent atelectasis or scarring. 4. See separate T-spine dictation for complete thoracic spine findings. Electronically Signed   By: Constance Holster M.D.   On: 02/20/2019 15:28   Dg Thoracic Spine 2 View  Result Date: 02/20/2019 CLINICAL DATA:  Pain status post fall EXAM: THORACIC SPINE 2 VIEWS COMPARISON:  CT dated 10/14/2014.  PET-CT dated 01/23/2019. FINDINGS: There is severe height loss of the T8 vertebral body. There is height loss of the T9 vertebral body. There is anterior wedging of the T11 vertebral body. The upper thoracic spine is poorly evaluated secondary to overlapping osseous structures. IMPRESSION: 1. Limited study secondary to patient positioning as detailed above. 2. Age-indeterminate compression fractures involving the T8, T9 and T11 vertebral bodies. Correlation with point tenderness is recommended. These appear to be new since PET-CT dated 01/23/2019. These may represent pathologic fractures  in the setting of known widely metastatic breast carcinoma throughout the thoracolumbar spine. Electronically Signed   By: Constance Holster M.D.   On: 02/20/2019 15:34   Dg Lumbar Spine Complete  Result Date: 02/20/2019 CLINICAL DATA:  Pain status post fall EXAM: LUMBAR SPINE - COMPLETE 4+ VIEW COMPARISON:  None. FINDINGS: There are multilevel degenerative changes throughout the lumbar spine. There is no evidence for displaced fracture. There is some irregularity of the T12 vertebral body which may represent the patient's known metastatic disease. IMPRESSION: 1. No acute fracture involving the lumbar spine. 2. Known metastatic disease is better appreciated on prior PET-CT. Electronically Signed   By: Constance Holster M.D.   On: 02/20/2019 15:35   Ct Head Wo  Contrast  Result Date: 02/20/2019 CLINICAL DATA:  Head trauma. Widespread skeletal metastasis demonstrated on recent PET-CT scan. History of breast cancer EXAM: CT HEAD WITHOUT CONTRAST TECHNIQUE: Contiguous axial images were obtained from the base of the skull through the vertex without intravenous contrast. COMPARISON:  PET-CT 01/23/2019 FINDINGS: Brain: No acute intracranial hemorrhage. No focal mass lesion. No CT evidence of acute infarction. No midline shift or mass effect. No hydrocephalus. Basilar cisterns are patent. Vascular: No hyperdense vessel or unexpected calcification. Skull: Aggressive calvarial metastasis replaces the inner and outer table of broad 4 cm segment of the LEFT temporal bone. There is soft tissue expansion of the medullary space with complete destruction of the bony elements. Expansion measures 1.3 cm in width. This expansion mildly effaces the LEFT temporal lobe. Multiple additional lytic lesions are scattered throughout the calvarium. Lytic lesion in the LEFT sphenoid bone (image 6/2). Sinuses/Orbits: No acute finding. Other: None IMPRESSION: 1. Aggressive lytic lesion in the LEFT temporal lobe replaces the inner and outer cortex over a 4 cm segment with soft tissue expansion mildly effacing the temporal lobe. 2. Multiple additional lytic lesions throughout the calvarium and sphenoid bone. 3. No intracranial trauma. 4. Widespread hypermetabolic skeletal metastasis demonstrated on PET-CT scan 01/23/2019. Electronically Signed   By: Suzy Bouchard M.D.   On: 02/20/2019 15:28   Ct Chest W Contrast  Result Date: 02/20/2019 CLINICAL DATA:  Acute pain due to trauma EXAM: CT CHEST, ABDOMEN, AND PELVIS WITH CONTRAST CT LUMBAR SPINE WITH CONTRAST. TECHNIQUE: Multidetector CT imaging of the chest, abdomen and pelvis was performed following the standard protocol during bolus administration of intravenous contrast. Technique: Multiplanar CT images of the lumbar spine were reconstructed from  contemporary CT of the Abdomen and Pelvis. CONTRAST:  140mL OMNIPAQUE IOHEXOL 300 MG/ML  SOLN COMPARISON:  None. FINDINGS: CT CHEST FINDINGS Cardiovascular: The heart size is normal. There is no significant pericardial effusion. No large centrally located pulmonary embolus. The thoracic aorta is not aneurysmal. There are few question of filling defects within segmental branches of the pulmonary artery which are not well evaluated on this exam. Mediastinum/Nodes: --there are few mildly prominent mediastinal and hilar lymph nodes that remain subcentimeter in size. --No axillary lymphadenopathy. --No supraclavicular lymphadenopathy. --Normal thyroid gland. --The esophagus is unremarkable Lungs/Pleura: There is no pneumothorax. There is a small right-sided pleural effusion which is new since prior PET-CT. There is atelectasis at the lung bases. There is presumed atelectasis involving portions of the right middle lobe and right lower lobe. There is atelectasis at the left lung base. Musculoskeletal: Bilateral breast implants are noted. Diffuse osseous metastatic disease is noted involving multiple ribs bilaterally. There is osseous metastatic disease involving the C7 vertebral body. This is only partially visualized. There is no definite  pathologic fracture involving the ribs. There is a pathologic fracture involving the T8 through T11 vertebral bodies, worse at the T8 level. There are lytic lesions involving the sternum. There is extensive soft tissue destruction of the T10 vertebral body likely resulting in spinal canal narrowing. CT ABDOMEN PELVIS FINDINGS Hepatobiliary: There is a nodular surface contour of the liver. Normal gallbladder.There is no biliary ductal dilation. Pancreas: Normal contours without ductal dilatation. No peripancreatic fluid collection. Spleen: No splenic laceration or hematoma. Adrenals/Urinary Tract: --Adrenal glands: No adrenal hemorrhage. --Right kidney/ureter: There is a nonobstructing  stone in the lower pole the right kidney. --Left kidney/ureter: There are nonobstructing stones involving the upper pole the left kidney. --Urinary bladder: Unremarkable. Stomach/Bowel: --Stomach/Duodenum: There is a large hiatal hernia. --Small bowel: No dilatation or inflammation. --Colon: There is a large soft tissue mass that appears to encompass much of the sigmoid colon. This mass measures approximately 13.2 x 4.5 cm. It does not appear to cause an obstruction at this time. --Appendix: Not visualized. No right lower quadrant inflammation or free fluid. Vascular/Lymphatic: Normal course and caliber of the major abdominal vessels. --there are few mildly prominent retroperitoneal lymph nodes. --there are enlarged mesenteric lymph nodes. --there is a soft tissue mass in the right hemipelvis measuring 1.1 cm which may represent a pathologically enlarged lymph node or metastatic deposit. Reproductive: There is a probable fibroid uterus. The ovaries are not clearly defined. Other: There are innumerable peritoneal/omental implants throughout the abdomen. There is a large amorphous soft tissue mass in the left hemipelvis closely associated with the patient's sigmoid colon. There is a trace amount of free fluid in the patient's abdomen and pelvis. Musculoskeletal. 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 There may be a pathologic fracture through the L2 vertebral body with minimal height loss. There is a prominent Schmorl's node involving the inferior endplate of the L4 vertebral body. There is metastatic disease involving the pelvic bones. No definite displaced fracture through the greater trochanter of the right. IMPRESSION: CT CHEST, ABDOMEN, AND PELVIS: 1. There are pathologic fractures involving the T8 through T11 vertebral bodies, most severe at the T8 level. There is no significant retropulsion. 2. Extensive osseous metastatic disease is noted involving the visualized osseous  structures as detailed above. There is a large soft tissue component at the T10 level likely resulting in some degree of spinal canal narrowing. 3. No large centrally located pulmonary embolus. There are few questionable filling defects at the segmental subsegmental levels that are not well evaluated on this exam secondary to technique. If there is clinical concern for pulmonary embolus, consider a follow-up PE study as clinically indicated. If there are pulmonary emboli, the overall volume is likely low. 4. New small right-sided pleural effusion. Bilateral atelectasis is noted. 5. Extensive soft tissue nodules are noted throughout the peritoneal cavity/omentum, consistent with widely metastatic disease. 6. Large amorphous soft tissue mass in the patient's left hemipelvis, closely associated with the sigmoid colon. 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 A second primary lesion seems less likely but is not entirely excluded. There is no evidence for obstruction at this time. 7. No convincing evidence for a fracture through the greater trochanter of the right hip. 8. Additional chronic findings as detailed above. CT LUMBAR SPINE: 1. There is a pathologic fracture of the L2 vertebral body with minimal height loss and minimal retropulsion. 2. 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 Electronically Signed  By: Constance Holster M.D.   On: 02/20/2019 18:44   Ct Cervical Spine Wo Contrast  Result Date: 02/20/2019 CLINICAL DATA:  Pain status post trauma EXAM: CT CERVICAL SPINE WITHOUT CONTRAST TECHNIQUE: Multidetector CT imaging of the cervical spine was performed without intravenous contrast. Multiplanar CT image reconstructions were also generated. COMPARISON:  None. FINDINGS: Alignment: Normal. Skull base and vertebrae: There appears to be a large partially visualized lytic lesion involving the left calvarium. This is better  visualized on prior CT head. There are additional lytic lesions throughout the visualized osseous structures including at the skull base. There are extensive lytic lesions involving virtually all of the visualized cervical vertebral bodies. Soft tissues and spinal canal: No prevertebral fluid or swelling. No visible canal hematoma. Disc levels: There is multilevel disc height loss throughout the cervical spine Upper chest: Negative. Other: None. IMPRESSION: There are extensive lytic lesions throughout the cervical spine and skull base without convincing evidence for a pathologic fracture. Electronically Signed   By: Constance Holster M.D.   On: 02/20/2019 19:00   Ct Abdomen Pelvis W Contrast  Result Date: 02/20/2019 CLINICAL DATA:  Acute pain due to trauma EXAM: CT CHEST, ABDOMEN, AND PELVIS WITH CONTRAST CT LUMBAR SPINE WITH CONTRAST. TECHNIQUE: Multidetector CT imaging of the chest, abdomen and pelvis was performed following the standard protocol during bolus administration of intravenous contrast. Technique: Multiplanar CT images of the lumbar spine were reconstructed from contemporary CT of the Abdomen and Pelvis. CONTRAST:  124mL OMNIPAQUE IOHEXOL 300 MG/ML  SOLN COMPARISON:  None. FINDINGS: CT CHEST FINDINGS Cardiovascular: The heart size is normal. There is no significant pericardial effusion. No large centrally located pulmonary embolus. The thoracic aorta is not aneurysmal. There are few question of filling defects within segmental branches of the pulmonary artery which are not well evaluated on this exam. Mediastinum/Nodes: --there are few mildly prominent mediastinal and hilar lymph nodes that remain subcentimeter in size. --No axillary lymphadenopathy. --No supraclavicular lymphadenopathy. --Normal thyroid gland. --The esophagus is unremarkable Lungs/Pleura: There is no pneumothorax. There is a small right-sided pleural effusion which is new since prior PET-CT. There is atelectasis at the lung bases.  There is presumed atelectasis involving portions of the right middle lobe and right lower lobe. There is atelectasis at the left lung base. Musculoskeletal: Bilateral breast implants are noted. Diffuse osseous metastatic disease is noted involving multiple ribs bilaterally. There is osseous metastatic disease involving the C7 vertebral body. This is only partially visualized. There is no definite pathologic fracture involving the ribs. There is a pathologic fracture involving the T8 through T11 vertebral bodies, worse at the T8 level. There are lytic lesions involving the sternum. There is extensive soft tissue destruction of the T10 vertebral body likely resulting in spinal canal narrowing. CT ABDOMEN PELVIS FINDINGS Hepatobiliary: There is a nodular surface contour of the liver. Normal gallbladder.There is no biliary ductal dilation. Pancreas: Normal contours without ductal dilatation. No peripancreatic fluid collection. Spleen: No splenic laceration or hematoma. Adrenals/Urinary Tract: --Adrenal glands: No adrenal hemorrhage. --Right kidney/ureter: There is a nonobstructing stone in the lower pole the right kidney. --Left kidney/ureter: There are nonobstructing stones involving the upper pole the left kidney. --Urinary bladder: Unremarkable. Stomach/Bowel: --Stomach/Duodenum: There is a large hiatal hernia. --Small bowel: No dilatation or inflammation. --Colon: There is a large soft tissue mass that appears to encompass much of the sigmoid colon. This mass measures approximately 13.2 x 4.5 cm. It does not appear to cause an obstruction at this time. --Appendix: Not  visualized. No right lower quadrant inflammation or free fluid. Vascular/Lymphatic: Normal course and caliber of the major abdominal vessels. --there are few mildly prominent retroperitoneal lymph nodes. --there are enlarged mesenteric lymph nodes. --there is a soft tissue mass in the right hemipelvis measuring 1.1 cm which may represent a  pathologically enlarged lymph node or metastatic deposit. Reproductive: There is a probable fibroid uterus. The ovaries are not clearly defined. Other: There are innumerable peritoneal/omental implants throughout the abdomen. There is a large amorphous soft tissue mass in the left hemipelvis closely associated with the patient's sigmoid colon. There is a trace amount of free fluid in the patient's abdomen and pelvis. Musculoskeletal. 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 There may be a pathologic fracture through the L2 vertebral body with minimal height loss. There is a prominent Schmorl's node involving the inferior endplate of the L4 vertebral body. There is metastatic disease involving the pelvic bones. No definite displaced fracture through the greater trochanter of the right. IMPRESSION: CT CHEST, ABDOMEN, AND PELVIS: 1. There are pathologic fractures involving the T8 through T11 vertebral bodies, most severe at the T8 level. There is no significant retropulsion. 2. Extensive osseous metastatic disease is noted involving the visualized osseous structures as detailed above. There is a large soft tissue component at the T10 level likely resulting in some degree of spinal canal narrowing. 3. No large centrally located pulmonary embolus. There are few questionable filling defects at the segmental subsegmental levels that are not well evaluated on this exam secondary to technique. If there is clinical concern for pulmonary embolus, consider a follow-up PE study as clinically indicated. If there are pulmonary emboli, the overall volume is likely low. 4. New small right-sided pleural effusion. Bilateral atelectasis is noted. 5. Extensive soft tissue nodules are noted throughout the peritoneal cavity/omentum, consistent with widely metastatic disease. 6. Large amorphous soft tissue mass in the patient's left hemipelvis, closely associated with the sigmoid colon. This presumably  represents metastatic disease from the patient's known stage IV breast carcinoma. A second primary lesion seems less likely but is not entirely excluded. There is no evidence for obstruction at this time. 7. No convincing evidence for a fracture through the greater trochanter of the right hip. 8. Additional chronic findings as detailed above. CT LUMBAR SPINE: 1. There is a pathologic fracture of the L2 vertebral body with minimal height loss and minimal retropulsion. 2. 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 Electronically Signed   By: Constance Holster M.D.   On: 02/20/2019 18:44   Ct L-spine No Charge  Result Date: 02/20/2019 CLINICAL DATA:  Acute pain due to trauma EXAM: CT CHEST, ABDOMEN, AND PELVIS WITH CONTRAST CT LUMBAR SPINE WITH CONTRAST. TECHNIQUE: Multidetector CT imaging of the chest, abdomen and pelvis was performed following the standard protocol during bolus administration of intravenous contrast. Technique: Multiplanar CT images of the lumbar spine were reconstructed from contemporary CT of the Abdomen and Pelvis. CONTRAST:  138mL OMNIPAQUE IOHEXOL 300 MG/ML  SOLN COMPARISON:  None. FINDINGS: CT CHEST FINDINGS Cardiovascular: The heart size is normal. There is no significant pericardial effusion. No large centrally located pulmonary embolus. The thoracic aorta is not aneurysmal. There are few question of filling defects within segmental branches of the pulmonary artery which are not well evaluated on this exam. Mediastinum/Nodes: --there are few mildly prominent mediastinal and hilar lymph nodes that remain subcentimeter in size. --No axillary lymphadenopathy. --No supraclavicular lymphadenopathy. --Normal thyroid gland. --The esophagus  is unremarkable Lungs/Pleura: There is no pneumothorax. There is a small right-sided pleural effusion which is new since prior PET-CT. There is atelectasis at the lung bases. There is presumed atelectasis involving portions of  the right middle lobe and right lower lobe. There is atelectasis at the left lung base. Musculoskeletal: Bilateral breast implants are noted. Diffuse osseous metastatic disease is noted involving multiple ribs bilaterally. There is osseous metastatic disease involving the C7 vertebral body. This is only partially visualized. There is no definite pathologic fracture involving the ribs. There is a pathologic fracture involving the T8 through T11 vertebral bodies, worse at the T8 level. There are lytic lesions involving the sternum. There is extensive soft tissue destruction of the T10 vertebral body likely resulting in spinal canal narrowing. CT ABDOMEN PELVIS FINDINGS Hepatobiliary: There is a nodular surface contour of the liver. Normal gallbladder.There is no biliary ductal dilation. Pancreas: Normal contours without ductal dilatation. No peripancreatic fluid collection. Spleen: No splenic laceration or hematoma. Adrenals/Urinary Tract: --Adrenal glands: No adrenal hemorrhage. --Right kidney/ureter: There is a nonobstructing stone in the lower pole the right kidney. --Left kidney/ureter: There are nonobstructing stones involving the upper pole the left kidney. --Urinary bladder: Unremarkable. Stomach/Bowel: --Stomach/Duodenum: There is a large hiatal hernia. --Small bowel: No dilatation or inflammation. --Colon: There is a large soft tissue mass that appears to encompass much of the sigmoid colon. This mass measures approximately 13.2 x 4.5 cm. It does not appear to cause an obstruction at this time. --Appendix: Not visualized. No right lower quadrant inflammation or free fluid. Vascular/Lymphatic: Normal course and caliber of the major abdominal vessels. --there are few mildly prominent retroperitoneal lymph nodes. --there are enlarged mesenteric lymph nodes. --there is a soft tissue mass in the right hemipelvis measuring 1.1 cm which may represent a pathologically enlarged lymph node or metastatic deposit.  Reproductive: There is a probable fibroid uterus. The ovaries are not clearly defined. Other: There are innumerable peritoneal/omental implants throughout the abdomen. There is a large amorphous soft tissue mass in the left hemipelvis closely associated with the patient's sigmoid colon. There is a trace amount of free fluid in the patient's abdomen and pelvis. Musculoskeletal. 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 There may be a pathologic fracture through the L2 vertebral body with minimal height loss. There is a prominent Schmorl's node involving the inferior endplate of the L4 vertebral body. There is metastatic disease involving the pelvic bones. No definite displaced fracture through the greater trochanter of the right. IMPRESSION: CT CHEST, ABDOMEN, AND PELVIS: 1. There are pathologic fractures involving the T8 through T11 vertebral bodies, most severe at the T8 level. There is no significant retropulsion. 2. Extensive osseous metastatic disease is noted involving the visualized osseous structures as detailed above. There is a large soft tissue component at the T10 level likely resulting in some degree of spinal canal narrowing. 3. No large centrally located pulmonary embolus. There are few questionable filling defects at the segmental subsegmental levels that are not well evaluated on this exam secondary to technique. If there is clinical concern for pulmonary embolus, consider a follow-up PE study as clinically indicated. If there are pulmonary emboli, the overall volume is likely low. 4. New small right-sided pleural effusion. Bilateral atelectasis is noted. 5. Extensive soft tissue nodules are noted throughout the peritoneal cavity/omentum, consistent with widely metastatic disease. 6. Large amorphous soft tissue mass in the patient's left hemipelvis, closely associated with the sigmoid colon. This presumably represents metastatic disease from  the patient's known stage IV  breast carcinoma. A second primary lesion seems less likely but is not entirely excluded. There is no evidence for obstruction at this time. 7. No convincing evidence for a fracture through the greater trochanter of the right hip. 8. Additional chronic findings as detailed above. CT LUMBAR SPINE: 1. There is a pathologic fracture of the L2 vertebral body with minimal height loss and minimal retropulsion. 2. 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 Electronically Signed   By: Constance Holster M.D.   On: 02/20/2019 18:44   Dg Hips Bilat W Or Wo Pelvis 3-4 Views  Addendum Date: 02/20/2019   ADDENDUM REPORT: 02/20/2019 15:36 ADDENDUM: Scattered heterogeneous appearance of the osseous structures is likely related to the patient's known widely metastatic breast carcinoma. Electronically Signed   By: Constance Holster M.D.   On: 02/20/2019 15:36   Result Date: 02/20/2019 CLINICAL DATA:  Pain status post fall EXAM: DG HIP (WITH OR WITHOUT PELVIS) 3-4V BILAT COMPARISON:  None. FINDINGS: There is no acute displaced fracture or dislocation. There are advanced degenerative changes of both hips, right worse than left. There is a lucency through the greater trochanter of the right hip. IMPRESSION: 1. No definite acute displaced fracture or dislocation. 2. Lucency through the greater trochanter of the right hip, only visualized on a single view. This could represent a nondisplaced fracture or artifact. Correlation with point tenderness is recommended. 3. Osteoarthritis of both hips. Electronically Signed: By: Constance Holster M.D. On: 02/20/2019 15:25        Scheduled Meds:  fentaNYL  1 patch Transdermal Q72H   ferrous sulfate  325 mg Oral Q breakfast    morphine injection  1 mg Intravenous Once   tamoxifen  20 mg Oral Daily   Continuous Infusions:   LOS: 0 days    Time spent: 36min    Domenic Polite, MD [facility] Page via [email or web address], password  [email or web address] After 7PM please contact night-coverage  02/21/2019, 3:31 PM

## 2019-02-21 NOTE — TOC Initial Note (Signed)
Transition of Care [facility]) - Initial/Assessment Note    Patient Details  Name: [name] MRN: [id number] Date of Birth: 11-28-1940  Transition of Care [facility]) CM/SW Contact:    Alexander [place], [place] Phone Number: 02/21/2019, 11:54 AM  Clinical Narrative:                 CSW spoke with pt at bedside; introduced self, role, reason for visit. Pt flat and hard to engage at times. She is from home alone and states she usually uses a quad cane to get around. Pt states she was unable to work with PT today due to pain. Her home is multiple levels with an elevator but she states she plans to go to her son's home at discharge and said that he and her daughter in law can assist at discharge. CSW concerned about son being able to provide the level of assistance pt requiring at discharge given that pt could not feed herself or sit up in bed due to pain. She is concerned about COVID and going to a facility which is understandable; however we want to ensure that pt is going to be able to have the level of assistance needed at discharge.   CSW gained permission to speak with pt son and will call him to ensure that he can assist.   Expected Discharge Plan: Skilled Nursing Facility Barriers to Discharge: Continued Medical Work up, Ship broker, Programmer, applications ([place])   Patient Goals and CMS Choice Patient states their goals for this hospitalization and ongoing recovery are:: to have less pain CMS Medicare.gov Compare Post Acute Care list provided to:: Patient Choice offered to / list presented to : Patient  Expected Discharge Plan and Services Expected Discharge Plan: [place] In-house Referral: Clinical Social Work Discharge Planning Services: CM Consult Post Acute Care Choice: [place] Living arrangements for the past 2 months: [place]  Prior Living Arrangements/Services Living arrangements for the past 2 months: Single Family  Home Lives with:: Self Patient language and need for interpreter reviewed:: Yes(no needs) Do you feel safe going back to the place where you live?: Yes      Need for Family Participation in Patient Care: Yes (Comment)(assistance with ADL/IADLs; supervision) Care giver support system in place?: Yes (comment)(adult child) Current home services: DME Criminal Activity/Legal Involvement Pertinent to Current Situation/Hospitalization: No - Comment as needed  Activities of Daily Living   ADL Screening (condition at time of admission) Patient's cognitive ability adequate to safely complete daily activities?: Yes Is the patient deaf or have difficulty hearing?: No Does the patient have difficulty seeing, even when wearing glasses/contacts?: No Does the patient have difficulty concentrating, remembering, or making decisions?: No Patient able to express need for assistance with ADLs?: Yes Does the patient have difficulty dressing or bathing?: No Independently performs ADLs?: No Does the patient have difficulty walking or climbing stairs?: Yes Weakness of Legs: Both Weakness of Arms/Hands: Both  Permission Sought/Granted Permission sought to share information with : Facility Sport and exercise psychologist, Family Supports Permission granted to share information with : Yes, Verbal Permission Granted  Share Information with NAME: Jynesis Maltese     Permission granted to share info w Relationship: son  Permission granted to share info w Contact Information: [phone]  Emotional Assessment Appearance:: Appears stated age Attitude/Demeanor/Rapport: Guarded, Lethargic, Self-Absorbed Affect (typically observed): Defensive, Flat, Guarded Orientation: : Oriented to Situation, Oriented to  Time, Oriented to Place, Oriented to Self Alcohol / Substance Use: Not Applicable  Psych Involvement: No (comment)  Admission diagnosis:  Back pain [M54.9] Fall [W19.XXXA] Elevated INR [R79.1] Injury of head, initial  encounter [S09.90XA] Fall in home, initial encounter [W19.Merril Abbe, [id number] Patient Active Problem List   Diagnosis Date Noted  . Hypercalcemia 02/20/2019  . Hypokalemia 02/20/2019  . Fall 02/20/2019  . Supratherapeutic INR 02/20/2019  . Left rib fracture 02/20/2019  . Vertebral fracture, pathological 02/20/2019  . Pain due to onychomycosis of toenail of right foot 01/24/2019  . Coagulation disorder ([place]) 01/24/2019  . Corn of toe 01/24/2019  . Iron deficiency anemia 04/04/2018  . Symptomatic anemia 03/21/2018  . Essential hypertension 03/21/2018  . 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 Malignant neoplasm of lower-outer quadrant of right breast of female, estrogen receptor positive ([place]) 06/29/2016  . Bone metastasis ([place]) 06/29/2016  . Carcinoma of right breast metastatic to multiple sites ([place]) 06/28/2016  . Acute blood loss anemia 10/21/2014  . Edema 10/21/2014  . DVT of lower extremity, bilateral ([place]) 10/16/2014  . IVC (inferior vena cava obstruction)   . Orthostatic dizziness   . Pulmonary embolism ([place])   . Hx of DVT of lower extremity 09/28/14 10/14/2014  . Spontaneous hemorrhage on Xarelto-Xarelto stopped 10/01/14 10/14/2014  . Chronic anemia 10/14/2014  . AKI (acute kidney injury) ([place])   . Acute kidney injury ([place]) 10/10/2014  . Hypercholesterolemia   . GERD (gastroesophageal reflux disease)   . Depression   . History of PE 09/27/14   . Syncope 10/10/14   . SOB (shortness of breath)   . History of therapeutic radiation 05/24/2014  . History of breast augmentation 05/24/2014  . Personal history of breast cancer 05/24/2014  . 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 Breast cancer of lower-outer quadrant of right female breast ([place]) 07/19/2013   PCP:  Kelton Pillar, MD Pharmacy:   [facility] - [place], [address] [place] [address] Phone:  [phone] Fax: [place], [address] AT [address] [place] [address] Phone: [phone] Fax: [phone]     Social Determinants of Health (SDOH) Interventions    Readmission Risk Interventions No flowsheet data found.

## 2019-02-21 NOTE — Evaluation (Signed)
Occupational Therapy Evaluation Patient Details Name: [name] MRN: [id number] DOB: 04-Nov-1940 Today's Date: 02/21/2019    History of Present Illness Pt is a 78 y.o. female admitted 02/20/19 after fall at home due to legs giving out, had to lay on ground for 6 hrs until daughter found her. CXR with L 5-6th rib fx. Head CT without intracranial trauma; aggressive lytic lesion in L temporal lobe. Chest/abdomen/lumbar CT showed extensive osseous metastatic disease, fxs of T8-11, new R-side pleural effusion. PMH includes breast cancer s/p radiation, HTN, fibromyalgia.   Clinical Impression   Eval limited due t pain. Pt  with decline in function and safety with ADLs and ADL mobility with impaired strength, balance and endurance. Pt very limited by generalized pain and minimally participated/allowed bed mobility rolling. Pt states that she does not want to got to SNF for ST rehab and that she will go to her son's home for her son and daughter in law to assist her. Pt currently requires total A with selfcare and is unable to sit on EOB.  PTA, pt lived at home alone and used a cane for mobility and was independent with ADLs/selfcare and had family assist for meals and home mgt. Pt reports that her husband lives at her son's home where he requires care. OT educated pt on importance of activity and getting OOB to work with therapy and current level of care required may not be able to be provided by her son/family. Pt would benefit for acute OT services to address impairments to maximize level of function and safety    Follow Up Recommendations  SNF(Pt refusing SNF)    Equipment Recommendations  Other (comment)    Recommendations for Other Services       Precautions / Restrictions Precautions Precautions: Fall Precaution Comments: Rib fxs Restrictions Weight Bearing Restrictions: No      Mobility Bed Mobility Overal bed mobility: Needs Assistance Bed Mobility: Rolling           General  bed mobility comments: Minimal attempt at rolling with preparation to sit EOB, pt ultimately deferring attempt for mobility due to pain in back, ribs, legs; expect assist +2 due to pain. MaxA for repositioning BLEs in supine  Transfers                 General transfer comment: unable    Balance Overall balance assessment: (unable to assess at this time)                                         ADL either performed or assessed with clinical judgement   ADL Overall ADL's : Needs assistance/impaired Eating/Feeding: Set up;Sitting;Bed level   Grooming: Wash/dry hands;Wash/dry face;Sitting;Bed level       Lower Body Bathing: Total assistance   Upper Body Dressing : Total assistance   Lower Body Dressing: Total assistance   Toilet Transfer: Total assistance   Toileting- Clothing Manipulation and Hygiene: Total assistance;Bed level       Functional mobility during ADLs: (unable due to pain) General ADL Comments: pt very limited and distracted by pain, unrealistic about benig able to go home unless she progresses     Vision Baseline Vision/History: Wears glasses Wears Glasses: Reading only Patient Visual Report: No change from baseline       Perception     Praxis      Pertinent Vitals/Pain Pain Assessment: Faces Faces  Pain Scale: Hurts whole lot Pain Location: Back, ribs, all over Pain Descriptors / Indicators: Sore;Guarding;Grimacing;Moaning Pain Intervention(s): Limited activity within patient's tolerance;Monitored during session;Premedicated before session;Repositioned     Hand Dominance Right   Extremity/Trunk Assessment Upper Extremity Assessment Upper Extremity Assessment: Generalized weakness   Lower Extremity Assessment Lower Extremity Assessment: Defer to PT evaluation       Communication Communication Communication: No difficulties   Cognition Arousal/Alertness: Awake/alert Behavior During Therapy: Flat affect Overall  Cognitive Status: No family/caregiver present to determine baseline cognitive functioning                                 General Comments: Seems WFL for simple tasks, although difficult to reason with pt regarding importance of mobility as she is distracted by pain   General Comments  Max education/encouragement on importance of mobility, especially regarding pulmonary function; pt stating "not today." Pt with productive cough    Exercises     Shoulder Instructions      Home Living Family/patient expects to be discharged to:: Private residence Living Arrangements: Alone Available Help at Discharge: Family;Available 24 hours/day Type of Home: House Home Access: Level entry     Home Layout: One level     Bathroom Shower/Tub: Tub/shower unit;Walk-in shower   Bathroom Toilet: Standard     Home Equipment: Cane - quad   Additional Comments: Reports she plans to d/c to son's home and live there permanently (home set-up info is for son's home)      Prior Functioning/Environment Level of Independence: Independent with assistive device(s)        Comments: Mod indep with quad cane. Endorses two recent falls down for multiple hours until found by family member; does not have life alert, states that she was 24 with ADLs and had assist from family with meal  prep and home mgt        OT Problem List: Decreased strength;Impaired balance (sitting and/or standing);Decreased cognition;Pain;Decreased activity tolerance      OT Treatment/Interventions: Self-care/ADL training;DME and/or AE instruction;Therapeutic activities;Therapeutic exercise;Patient/family education    OT Goals(Current goals can be found in the care plan section) Acute Rehab OT Goals Patient Stated Goal: To not move because in too much pain OT Goal Formulation: With patient Time For Goal Achievement: 03/07/19 Potential to Achieve Goals: Good ADL Goals Pt Will Perform Grooming: with min  assist;with min guard assist;sitting Pt Will Perform Upper Body Bathing: with mod assist;sitting Pt Will Perform Lower Body Bathing: with mod assist;sitting/lateral leans Pt Will Perform Upper Body Dressing: with mod assist;sitting Pt Will Transfer to Toilet: with max assist;with +2 assist;stand pivot transfer;bedside commode Additional ADL Goal #1: pt will complete bed mobiliyt max A to sit EOB for ADL/selfcare tasks  OT Frequency: Min 2X/week   Barriers to D/C: Decreased caregiver support  pt lives at home alone       Co-evaluation              AM-PAC OT "6 Clicks" Daily Activity     Outcome Measure Help from another person eating meals?: None Help from another person taking care of personal grooming?: A Little Help from another person toileting, which includes using toliet, bedpan, or urinal?: Total Help from another person bathing (including washing, rinsing, drying)?: Total Help from another person to put on and taking off regular upper body clothing?: Total Help from another person to put on and taking off regular lower body clothing?:  Total 6 Click Score: 11   End of Session    Activity Tolerance: Patient limited by pain Patient left: in bed;with call bell/phone within reach;with bed alarm set  OT Visit Diagnosis: Other abnormalities of gait and mobility (R26.89);Muscle weakness (generalized) (M62.81);History of falling (Z91.81);Pain;Other symptoms and signs involving cognitive function Pain - Right/Left: (generalized) Pain - part of body: (neck, ribs, head)                Time: [id number] OT Time Calculation (min): 20 min Charges:  OT General Charges $OT Visit: 1 Visit OT Evaluation $OT Eval Moderate Complexity: 1 Mod    Britt Bottom 02/21/2019, 2:55 PM

## 2019-02-22 ENCOUNTER — Ambulatory Visit: Payer: Medicare Other

## 2019-02-22 DIAGNOSIS — S2242XG Multiple fractures of ribs, left side, subsequent encounter for fracture with delayed healing: Secondary | ICD-10-CM

## 2019-02-22 DIAGNOSIS — D649 Anemia, unspecified: Secondary | ICD-10-CM

## 2019-02-22 DIAGNOSIS — E876 Hypokalemia: Secondary | ICD-10-CM

## 2019-02-22 DIAGNOSIS — M8458XA Pathological fracture in neoplastic disease, other specified site, initial encounter for fracture: Secondary | ICD-10-CM

## 2019-02-22 DIAGNOSIS — I82523 Chronic embolism and thrombosis of iliac vein, bilateral: Secondary | ICD-10-CM

## 2019-02-22 DIAGNOSIS — W19XXXD Unspecified fall, subsequent encounter: Secondary | ICD-10-CM

## 2019-02-22 LAB — COMPREHENSIVE METABOLIC PANEL
ALT: 27 U/L (ref 0–44)
AST: 50 U/L — ABNORMAL HIGH (ref 15–41)
Albumin: 2.1 g/dL — ABNORMAL LOW (ref 3.5–5.0)
Alkaline Phosphatase: 123 U/L (ref 38–126)
Anion gap: 10 (ref 5–15)
BUN: 16 mg/dL (ref 8–23)
CO2: 28 mmol/L (ref 22–32)
Calcium: 10 mg/dL (ref 8.9–10.3)
Chloride: 99 mmol/L (ref 98–111)
Creatinine, Ser: 0.81 mg/dL (ref 0.44–1.00)
GFR calc Af Amer: >60 mL/min (ref >60)
GFR calc non Af Amer: >60 mL/min (ref >60)
Glucose, Bld: 160 mg/dL — ABNORMAL HIGH (ref 70–99)
Potassium: 4 mmol/L (ref 3.5–5.1)
Sodium: 137 mmol/L (ref 135–145)
Total Bilirubin: 1.2 mg/dL (ref 0.3–1.2)
Total Protein: 4.7 g/dL — ABNORMAL LOW (ref 6.5–8.1)

## 2019-02-22 LAB — URINE CULTURE: Culture: 80000 — AB

## 2019-02-22 LAB — CBC
HCT: 32.3 % — ABNORMAL LOW (ref 36.0–46.0)
Hemoglobin: 10.1 g/dL — ABNORMAL LOW (ref 12.0–15.0)
MCH: 28.9 pg (ref 26.0–34.0)
MCHC: 31.3 g/dL (ref 30.0–36.0)
MCV: 92.3 fL (ref 80.0–100.0)
Platelets: 113 10*3/uL — ABNORMAL LOW (ref 150–400)
RBC: 3.5 MIL/uL — ABNORMAL LOW (ref 3.87–5.11)
RDW: 15.3 % (ref 11.5–15.5)
WBC: 2.4 10*3/uL — ABNORMAL LOW (ref 4.0–10.5)
nRBC: 0.9 % — ABNORMAL HIGH (ref 0.0–0.2)

## 2019-02-22 LAB — PROTIME-INR
INR: 1.1 (ref 0.8–1.2)
Prothrombin Time: 14.5 seconds (ref 11.4–15.2)

## 2019-02-22 LAB — CALCITRIOL (1,25 DI-OH VIT D): Vit D, 1,25-Dihydroxy: 23.7 pg/mL (ref 19.9–79.3)

## 2019-02-22 LAB — PARATHYROID HORMONE, INTACT (NO CA): PTH: 45 pg/mL (ref 15–65)

## 2019-02-22 MED ORDER — METOPROLOL TARTRATE 5 MG/5ML IV SOLN: 5.0000 mg | Freq: Once | INTRAVENOUS | Status: DC

## 2019-02-22 MED ORDER — FENTANYL 50 MCG/HR TD PT72: 1.0000 | MEDICATED_PATCH | TRANSDERMAL | 0 refills | Status: AC

## 2019-02-22 MED ORDER — METHOCARBAMOL 500 MG PO TABS: 500.0000 mg | ORAL_TABLET | Freq: Three times a day (TID) | ORAL | 0 refills | Status: AC | PRN

## 2019-02-22 MED ORDER — POLYETHYLENE GLYCOL 3350 17 G PO PACK: 17.0000 g | PACK | Freq: Every day | ORAL | Status: DC | PRN

## 2019-02-22 MED ORDER — CEPHALEXIN 500 MG PO CAPS
500.0000 mg | ORAL_CAPSULE | Freq: Two times a day (BID) | ORAL | Status: AC
  Administered 2019-02-22 – 2019-02-24 (×6): 500 mg via ORAL
  Filled 2019-02-22 (×6): qty 1

## 2019-02-22 MED ORDER — OXYCODONE-ACETAMINOPHEN 5-325 MG PO TABS: 1.0000 | ORAL_TABLET | Freq: Four times a day (QID) | ORAL | 0 refills | Status: AC | PRN

## 2019-02-22 MED ORDER — SODIUM CHLORIDE 0.9 % IV BOLUS: 500.0000 mL | Freq: Once | INTRAVENOUS | Status: DC

## 2019-02-22 MED ORDER — SENNOSIDES-DOCUSATE SODIUM 8.6-50 MG PO TABS: 2.0000 | ORAL_TABLET | Freq: Every evening | ORAL | Status: DC | PRN

## 2019-02-22 NOTE — Progress Notes (Signed)
Manufacturing engineer [facility]) Hospital Liaison note.   Received request from [employer], [place] for family interest in [facility]. Chart reviewed and spoke with family to acknowledge referral.  Unfortunately [facility] is not able to offer a room today. Family and CSW are aware [facility] liaison will follow up with CSW and family tomorrow or sooner if room becomes available.  Please do not hesitate to call with questions.   Thank you,   Farrel Gordon, RN, [facility]   [place]     [place] are on AMION

## 2019-02-22 NOTE — TOC Progression Note (Addendum)
Transition of Care [facility]) - Progression Note    Patient Details  Name: Brianah Bava MRN: [id number] Date of Birth: 1941/04/08  Transition of Care [facility]) CM/SW [place], [place] Phone Number: 02/22/2019, 10:37 AM  Clinical Narrative:    2:33pm- Pt referral has been made at pt son request to [place] for consideration for [facility]. We discussed that if pt not eligible that we would need to discuss either private pay hospice at SNF vs hospice at home. Pt son prefers [employer] over those two options. Referral made to [facility].   10:37am- Acknowledging consult for hospice placement; will need pt and family choice prior to referral. Pt not eligible for hospice at SNF due to not having Medicaid at this time. CSW has called pt son [name] at [phone] as CSW has obtained permission to speak with him and pt care was being performed during attempted visit.    Expected Discharge Plan: Skilled Nursing Facility Barriers to Discharge: Continued Medical Work up, Ship broker, Environmental education officer)  Expected Discharge Plan and Services Expected Discharge Plan: [place] In-house Referral: Clinical Social Work Discharge Planning Services: CM Consult Post Acute Care Choice: [place] arrangements for the past 2 months: Single Family Home   Social Determinants of Health (SDOH) Interventions    Readmission Risk Interventions No flowsheet data found.

## 2019-02-22 NOTE — Progress Notes (Addendum)
PROGRESS NOTE    [name]  [id number] DOB: [birth year] DOA: 02/20/2019 PCP: Kelton Pillar, MD   Brief Narrative:  78 year old with metastatic breast cancer, extensive bony metastases status post radiation treatment, PE/DVT on Coumadin, fibromyalgia came to the hospital after sustaining mechanical fall.  Multiple fractures noted.  Work-up showed extensive lytic lesions throughout vertebrae.  Seen by oncology recommending hospice and pain control at this time.   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  Seen by oncology-no further option for chemo or radiation at this time.  Not a candidate for any sort of surgical intervention.  Incentive spirometer - Patient is hospice appropriate, currently trying to get in touch with the family to address disposition  Urinary tract infection with E. coli -We will start her on oral Keflex  Hypercalcemia - Improved  Coagulopathy with supratherapeutic INR, initially greater than 10 - Vitamin K given.  Coumadin has been on hold. - Will need to discuss with family regarding long-term options  History of PE/DVT -On Coumadin but awaiting decision with family for long-term treatment.  In the meantime we will do treatment dose of Lovenox    DVT prophylaxis: Lovenox 1 mg/kg Arroyo 12 hours Code Status: DNR Family Communication: Left voicemail for her son [name] Disposition Plan: Still to be determined- home hospice versus residential  hospice  UPDATE 1150PM= spoke with the patient's son Peter Minium wants patient to go to residential hospice.  Would not want any anticoagulation or further treatment for malignancy.  He wishes to keep her mother comfortable at this time.  At the moment I will discontinue anticoagulation, tamoxifen, iron supplements.  Will focus on making her comfortable including pain medications.  Okay with treating UTI with short course of antibiotics. Social worker aware of the plan, she will work on finding her residential hospice facility.  Appreciate her assistance.  Consultants:   Oncology  Procedures:   None  Antimicrobials:   None   Subjective: Has some generalized pain but overall no complaints at the moment.  She agrees and knows that her condition is terminal and no treatment options available at this time.  States her pain medications are adequate for now and does not want any escalation.  Review of Systems Otherwise negative except as per HPI, including: General: Denies fever, chills, night sweats or unintended weight loss. Resp: Denies cough, wheezing, shortness of breath. Cardiac: Denies chest pain, palpitations, orthopnea, paroxysmal nocturnal dyspnea. GI: Denies abdominal pain, nausea, vomiting, diarrhea or constipation GU: Denies dysuria, frequency, hesitancy or incontinence MS: Denies muscle aches, joint pain or swelling Neuro: Denies headache, neurologic deficits (focal weakness, numbness, tingling), abnormal gait Psych: Denies anxiety, depression, SI/HI/AVH Skin: Denies new rashes or lesions ID: Denies sick contacts, exotic exposures, travel  Objective: Vitals:   02/21/19 1445 02/21/19 2110 02/22/19 0526 02/22/19 1039  BP: 134/64 136/68 (!) 143/81   Pulse: 98 96 (!) 108 (!) 120  Resp: 20 18 18    Temp: 98 F (36.7 C) 98.1 F (36.7 C) 99.3 F (37.4 C)  TempSrc: Oral Oral Oral   SpO2: 92% 96% 92%   Weight:      Height:        Intake/Output Summary (Last 24 hours) at  02/22/2019 1133 Last data filed at 02/22/2019 0401 Gross per 24 hour  Intake 1090 ml  Output 1100 ml  Net -10 ml   Filed Weights   02/20/19 2227  Weight: 74.1 kg    Examination:  General exam: Some discomfort due to pain Respiratory system: Clear to auscultation. Respiratory effort normal. Cardiovascular system: S1 & S2 heard, RRR. No JVD, murmurs, rubs, gallops or clicks. No pedal edema. Gastrointestinal system: Abdomen is nondistended, soft and nontender. No organomegaly or masses felt. Normal bowel sounds heard. Central nervous system: Alert and oriented. No focal neurological deficits. Extremities: Symmetric 4 x 5 power. Skin: No rashes, lesions or ulcers Psychiatry: Judgement and insight appear normal. Mood & affect appropriate.     Data Reviewed:   CBC: Recent Labs  Lab 02/20/19 1311 02/21/19 0229 02/22/19 0348  WBC 3.3* 3.0* 2.4*  NEUTROABS 3.0  --   --   HGB 13.1 11.0* 10.1*  HCT 42.1 35.1* 32.3*  MCV 92.7 91.6 92.3  PLT 149* 128* [id number]*   Basic Metabolic Panel: Recent Labs  Lab 02/20/19 1311 02/21/19 0229 02/22/19 0348  NA 141 142 137  K 3.4* 3.4* 4.0  CL 98 102 99  CO2 30 30 28   GLUCOSE 147* 157* 160*  BUN 22 19 16   CREATININE 0.92 0.87 0.81  CALCIUM 11.5* 10.7* 10.0  MG  --  1.9  --    GFR: Estimated Creatinine Clearance: 56.1 mL/min (by C-G formula based on SCr of 0.81 mg/dL). Liver Function Tests: Recent Labs  Lab 02/20/19 1311 02/22/19 0348  AST 44* 50*  ALT 28 27  ALKPHOS 136* 123  BILITOT 1.1 1.2  PROT 5.8* 4.7*  ALBUMIN 2.9* 2.1*   No results for input(s): LIPASE, AMYLASE in the last 168 hours. No results for input(s): AMMONIA in the last 168 hours. Coagulation Profile: Recent Labs  Lab 02/20/19 1311 02/20/19 1532 02/21/19 0229 02/22/19 0348  INR >10.0* >10.0* >10.0* 1.1   Cardiac Enzymes: Recent Labs  Lab 02/20/19 1600  CKTOTAL 62   BNP (last 3 results) No results for input(s): PROBNP in the last 8760  hours. HbA1C: No results for input(s): HGBA1C in the last 72 hours. CBG: No results for input(s): GLUCAP in the last 168 hours. Lipid Profile: No results for input(s): CHOL, HDL, LDLCALC, TRIG, CHOLHDL, LDLDIRECT in the last 72 hours. Thyroid Function Tests: No results for input(s): TSH, T4TOTAL, FREET4, T3FREE, THYROIDAB in the last 72 hours. Anemia Panel: No results for input(s): VITAMINB12, FOLATE, FERRITIN, TIBC, IRON, RETICCTPCT in the last 72 hours. Sepsis Labs: No results for input(s): PROCALCITON, LATICACIDVEN in the last 168 hours.  Recent Results (from the past 240 hour(s))  Urine culture     Status: Abnormal   Collection Time: 02/20/19  2:21 PM   Specimen: Urine, Random  Result Value Ref Range Status   Specimen Description URINE, RANDOM  Final   Special Requests   Final    NONE Performed at [facility], [address], [place], [place] [address]    Culture 80,000 COLONIES/mL ESCHERICHIA COLI (A)  Final   Report Status 02/22/2019 FINAL  Final   Organism ID, Bacteria ESCHERICHIA COLI (A)  Final      Susceptibility   Escherichia coli - MIC*    AMPICILLIN <=2 SENSITIVE Sensitive  CEFAZOLIN <=4 SENSITIVE Sensitive     CEFTRIAXONE <=1 SENSITIVE Sensitive     CIPROFLOXACIN <=0.25 SENSITIVE Sensitive     GENTAMICIN <=1 SENSITIVE Sensitive     IMIPENEM <=0.25 SENSITIVE Sensitive     NITROFURANTOIN <=16 SENSITIVE Sensitive     TRIMETH/SULFA <=20 SENSITIVE Sensitive     AMPICILLIN/SULBACTAM <=2 SENSITIVE Sensitive     PIP/TAZO <=4 SENSITIVE Sensitive     Extended ESBL NEGATIVE Sensitive     * 80,000 COLONIES/mL ESCHERICHIA COLI  SARS Coronavirus 2 [facility] order, Performed in [facility] hospital lab) Nasopharyngeal Nasopharyngeal Swab     Status: None   Collection Time: 02/20/19  9:44 PM   Specimen: Nasopharyngeal Swab  Result Value Ref Range Status   SARS Coronavirus 2 NEGATIVE NEGATIVE Final    Comment: (NOTE) If result is NEGATIVE SARS-CoV-2 target  nucleic acids are NOT DETECTED. The SARS-CoV-2 RNA is generally detectable in upper and lower  respiratory specimens during the acute phase of infection. The lowest  concentration of SARS-CoV-2 viral copies this assay can detect is 250  copies / mL. A negative result does not preclude SARS-CoV-2 infection  and should not be used as the sole basis for treatment or other  patient management decisions.  A negative result may occur with  improper specimen collection / handling, submission of specimen other  than nasopharyngeal swab, presence of viral mutation(s) within the  areas targeted by this assay, and inadequate number of viral copies  (<250 copies / mL). A negative result must be combined with clinical  observations, patient history, and epidemiological information. If result is POSITIVE SARS-CoV-2 target nucleic acids are DETECTED. The SARS-CoV-2 RNA is generally detectable in upper and lower  respiratory specimens dur ing the acute phase of infection.  Positive  results are indicative of active infection with SARS-CoV-2.  Clinical  correlation with patient history and other diagnostic information is  necessary to determine patient infection status.  Positive results do  not rule out bacterial infection or co-infection with other viruses. If result is PRESUMPTIVE POSTIVE SARS-CoV-2 nucleic acids MAY BE PRESENT.   A presumptive positive result was obtained on the submitted specimen  and confirmed on repeat testing.  While 2019 novel coronavirus  (SARS-CoV-2) nucleic acids may be present in the submitted sample  additional confirmatory testing may be necessary for epidemiological  and / or clinical management purposes  to differentiate between  SARS-CoV-2 and other Sarbecovirus currently known to infect humans.  If clinically indicated additional testing with an alternate test  methodology [phone]) is advised. The SARS-CoV-2 RNA is generally  detectable in upper and lower  respiratory sp ecimens during the acute  phase of infection. The expected result is Negative. Fact Sheet for Patients:  StrictlyIdeas.no Fact Sheet for Healthcare Providers: BankingDealers.co.za This test is not yet approved or cleared by the [place] FDA and has been authorized for detection and/or diagnosis of SARS-CoV-2 by FDA under an Emergency Use Authorization (EUA).  This EUA will remain in effect (meaning this test can be used) for the duration of the COVID-19 declaration under Section 564(b)(1) of the Act, 21 U.S.C. section 360bbb-3(b)(1), unless the authorization is terminated or revoked sooner. Performed at [facility], [place] [address] [place], [place] [address]          Radiology Studies: Dg Chest 1 View  Result Date: 02/20/2019 CLINICAL DATA:  Pain status post fall EXAM: CHEST  1 VIEW COMPARISON:  CT dated 10/14/2014 FINDINGS: Heart size is mildly enlarged.  Aortic calcifications are noted. There is scarring versus atelectasis in the right lower lung zone. There is no pneumothorax. There appear to be mildly displaced fractures involving the fifth and sixth ribs anterior laterally on the left. There are old healed right-sided rib fractures. IMPRESSION: 1. Findings suspicious for mildly displaced fractures involving the fifth and sixth ribs laterally on the left. 2. No pneumothorax. 3. Linear opacity involving the right lower lung zone, favored to represent atelectasis or scarring. 4. See separate T-spine dictation for complete thoracic spine findings. Electronically Signed   By: Constance Holster M.D.   On: 02/20/2019 15:28   Dg Thoracic Spine 2 View  Result Date: 02/20/2019 CLINICAL DATA:  Pain status post fall EXAM: THORACIC SPINE 2 VIEWS COMPARISON:  CT dated 10/14/2014.  PET-CT dated 01/23/2019. FINDINGS: There is severe height loss of the T8 vertebral body. There is height loss of the T9 vertebral body. There is  anterior wedging of the T11 vertebral body. The upper thoracic spine is poorly evaluated secondary to overlapping osseous structures. IMPRESSION: 1. Limited study secondary to patient positioning as detailed above. 2. Age-indeterminate compression fractures involving the T8, T9 and T11 vertebral bodies. Correlation with point tenderness is recommended. These appear to be new since PET-CT dated 01/23/2019. These may represent pathologic fractures in the setting of known widely metastatic breast carcinoma throughout the thoracolumbar spine. Electronically Signed   By: Constance Holster M.D.   On: 02/20/2019 15:34   Dg Lumbar Spine Complete  Result Date: 02/20/2019 CLINICAL DATA:  Pain status post fall EXAM: LUMBAR SPINE - COMPLETE 4+ VIEW COMPARISON:  None. FINDINGS: There are multilevel degenerative changes throughout the lumbar spine. There is no evidence for displaced fracture. There is some irregularity of the T12 vertebral body which may represent the patient's known metastatic disease. IMPRESSION: 1. No acute fracture involving the lumbar spine. 2. Known metastatic disease is better appreciated on prior PET-CT. Electronically Signed   By: Constance Holster M.D.   On: 02/20/2019 15:35   Ct Head Wo Contrast  Result Date: 02/20/2019 CLINICAL DATA:  Head trauma. Widespread skeletal metastasis demonstrated on recent PET-CT scan. History of breast cancer EXAM: CT HEAD WITHOUT CONTRAST TECHNIQUE: Contiguous axial images were obtained from the base of the skull through the vertex without intravenous contrast. COMPARISON:  PET-CT 01/23/2019 FINDINGS: Brain: No acute intracranial hemorrhage. No focal mass lesion. No CT evidence of acute infarction. No midline shift or mass effect. No hydrocephalus. Basilar cisterns are patent. Vascular: No hyperdense vessel or unexpected calcification. Skull: Aggressive calvarial metastasis replaces the inner and outer table of broad 4 cm segment of the LEFT temporal bone. There  is soft tissue expansion of the medullary space with complete destruction of the bony elements. Expansion measures 1.3 cm in width. This expansion mildly effaces the LEFT temporal lobe. Multiple additional lytic lesions are scattered throughout the calvarium. Lytic lesion in the LEFT sphenoid bone (image 6/2). Sinuses/Orbits: No acute finding. Other: None IMPRESSION: 1. Aggressive lytic lesion in the LEFT temporal lobe replaces the inner and outer cortex over a 4 cm segment with soft tissue expansion mildly effacing the temporal lobe. 2. Multiple additional lytic lesions throughout the calvarium and sphenoid bone. 3. No intracranial trauma. 4. Widespread hypermetabolic skeletal metastasis demonstrated on PET-CT scan 01/23/2019. Electronically Signed   By: Suzy Bouchard M.D.   On: 02/20/2019 15:28   Ct Chest W Contrast  Result Date: 02/20/2019 CLINICAL DATA:  Acute pain due to trauma EXAM: CT CHEST, ABDOMEN, AND PELVIS WITH  CONTRAST CT LUMBAR SPINE WITH CONTRAST. TECHNIQUE: Multidetector CT imaging of the chest, abdomen and pelvis was performed following the standard protocol during bolus administration of intravenous contrast. Technique: Multiplanar CT images of the lumbar spine were reconstructed from contemporary CT of the Abdomen and Pelvis. CONTRAST:  164mL OMNIPAQUE IOHEXOL 300 MG/ML  SOLN COMPARISON:  None. FINDINGS: CT CHEST FINDINGS Cardiovascular: The heart size is normal. There is no significant pericardial effusion. No large centrally located pulmonary embolus. The thoracic aorta is not aneurysmal. There are few question of filling defects within segmental branches of the pulmonary artery which are not well evaluated on this exam. Mediastinum/Nodes: --there are few mildly prominent mediastinal and hilar lymph nodes that remain subcentimeter in size. --No axillary lymphadenopathy. --No supraclavicular lymphadenopathy. --Normal thyroid gland. --The esophagus is unremarkable Lungs/Pleura: There is no  pneumothorax. There is a small right-sided pleural effusion which is new since prior PET-CT. There is atelectasis at the lung bases. There is presumed atelectasis involving portions of the right middle lobe and right lower lobe. There is atelectasis at the left lung base. Musculoskeletal: Bilateral breast implants are noted. Diffuse osseous metastatic disease is noted involving multiple ribs bilaterally. There is osseous metastatic disease involving the C7 vertebral body. This is only partially visualized. There is no definite pathologic fracture involving the ribs. There is a pathologic fracture involving the T8 through T11 vertebral bodies, worse at the T8 level. There are lytic lesions involving the sternum. There is extensive soft tissue destruction of the T10 vertebral body likely resulting in spinal canal narrowing. CT ABDOMEN PELVIS FINDINGS Hepatobiliary: There is a nodular surface contour of the liver. Normal gallbladder.There is no biliary ductal dilation. Pancreas: Normal contours without ductal dilatation. No peripancreatic fluid collection. Spleen: No splenic laceration or hematoma. Adrenals/Urinary Tract: --Adrenal glands: No adrenal hemorrhage. --Right kidney/ureter: There is a nonobstructing stone in the lower pole the right kidney. --Left kidney/ureter: There are nonobstructing stones involving the upper pole the left kidney. --Urinary bladder: Unremarkable. Stomach/Bowel: --Stomach/Duodenum: There is a large hiatal hernia. --Small bowel: No dilatation or inflammation. --Colon: There is a large soft tissue mass that appears to encompass much of the sigmoid colon. This mass measures approximately 13.2 x 4.5 cm. It does not appear to cause an obstruction at this time. --Appendix: Not visualized. No right lower quadrant inflammation or free fluid. Vascular/Lymphatic: Normal course and caliber of the major abdominal vessels. --there are few mildly prominent retroperitoneal lymph nodes. --there are  enlarged mesenteric lymph nodes. --there is a soft tissue mass in the right hemipelvis measuring 1.1 cm which may represent a pathologically enlarged lymph node or metastatic deposit. Reproductive: There is a probable fibroid uterus. The ovaries are not clearly defined. Other: There are innumerable peritoneal/omental implants throughout the abdomen. There is a large amorphous soft tissue mass in the left hemipelvis closely associated with the patient's sigmoid colon. There is a trace amount of free fluid in the patient's abdomen and pelvis. Musculoskeletal. 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 There may be a pathologic fracture through the L2 vertebral body with minimal height loss. There is a prominent Schmorl's node involving the inferior endplate of the L4 vertebral body. There is metastatic disease involving the pelvic bones. No definite displaced fracture through the greater trochanter of the right. IMPRESSION: CT CHEST, ABDOMEN, AND PELVIS: 1. There are pathologic fractures involving the T8 through T11 vertebral bodies, most severe at the T8 level. There is no significant retropulsion. 2. Extensive osseous metastatic disease  is noted involving the visualized osseous structures as detailed above. There is a large soft tissue component at the T10 level likely resulting in some degree of spinal canal narrowing. 3. No large centrally located pulmonary embolus. There are few questionable filling defects at the segmental subsegmental levels that are not well evaluated on this exam secondary to technique. If there is clinical concern for pulmonary embolus, consider a follow-up PE study as clinically indicated. If there are pulmonary emboli, the overall volume is likely low. 4. New small right-sided pleural effusion. Bilateral atelectasis is noted. 5. Extensive soft tissue nodules are noted throughout the peritoneal cavity/omentum, consistent with widely metastatic disease. 6. Large  amorphous soft tissue mass in the patient's left hemipelvis, closely associated with the sigmoid colon. 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 A second primary lesion seems less likely but is not entirely excluded. There is no evidence for obstruction at this time. 7. No convincing evidence for a fracture through the greater trochanter of the right hip. 8. Additional chronic findings as detailed above. CT LUMBAR SPINE: 1. There is a pathologic fracture of the L2 vertebral body with minimal height loss and minimal retropulsion. 2. 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 Electronically Signed   By: Constance Holster M.D.   On: 02/20/2019 18:44   Ct Cervical Spine Wo Contrast  Result Date: 02/20/2019 CLINICAL DATA:  Pain status post trauma EXAM: CT CERVICAL SPINE WITHOUT CONTRAST TECHNIQUE: Multidetector CT imaging of the cervical spine was performed without intravenous contrast. Multiplanar CT image reconstructions were also generated. COMPARISON:  None. FINDINGS: Alignment: Normal. Skull base and vertebrae: There appears to be a large partially visualized lytic lesion involving the left calvarium. This is better visualized on prior CT head. There are additional lytic lesions throughout the visualized osseous structures including at the skull base. There are extensive lytic lesions involving virtually all of the visualized cervical vertebral bodies. Soft tissues and spinal canal: No prevertebral fluid or swelling. No visible canal hematoma. Disc levels: There is multilevel disc height loss throughout the cervical spine Upper chest: Negative. Other: None. IMPRESSION: There are extensive lytic lesions throughout the cervical spine and skull base without convincing evidence for a pathologic fracture. Electronically Signed   By: Constance Holster M.D.   On: 02/20/2019 19:00   Ct Abdomen Pelvis W Contrast  Result Date:  02/20/2019 CLINICAL DATA:  Acute pain due to trauma EXAM: CT CHEST, ABDOMEN, AND PELVIS WITH CONTRAST CT LUMBAR SPINE WITH CONTRAST. TECHNIQUE: Multidetector CT imaging of the chest, abdomen and pelvis was performed following the standard protocol during bolus administration of intravenous contrast. Technique: Multiplanar CT images of the lumbar spine were reconstructed from contemporary CT of the Abdomen and Pelvis. CONTRAST:  19mL OMNIPAQUE IOHEXOL 300 MG/ML  SOLN COMPARISON:  None. FINDINGS: CT CHEST FINDINGS Cardiovascular: The heart size is normal. There is no significant pericardial effusion. No large centrally located pulmonary embolus. The thoracic aorta is not aneurysmal. There are few question of filling defects within segmental branches of the pulmonary artery which are not well evaluated on this exam. Mediastinum/Nodes: --there are few mildly prominent mediastinal and hilar lymph nodes that remain subcentimeter in size. --No axillary lymphadenopathy. --No supraclavicular lymphadenopathy. --Normal thyroid gland. --The esophagus is unremarkable Lungs/Pleura: There is no pneumothorax. There is a small right-sided pleural effusion which is new since prior PET-CT. There is atelectasis at the lung bases. There is presumed atelectasis involving portions of the  right middle lobe and right lower lobe. There is atelectasis at the left lung base. Musculoskeletal: Bilateral breast implants are noted. Diffuse osseous metastatic disease is noted involving multiple ribs bilaterally. There is osseous metastatic disease involving the C7 vertebral body. This is only partially visualized. There is no definite pathologic fracture involving the ribs. There is a pathologic fracture involving the T8 through T11 vertebral bodies, worse at the T8 level. There are lytic lesions involving the sternum. There is extensive soft tissue destruction of the T10 vertebral body likely resulting in spinal canal narrowing. CT ABDOMEN PELVIS  FINDINGS Hepatobiliary: There is a nodular surface contour of the liver. Normal gallbladder.There is no biliary ductal dilation. Pancreas: Normal contours without ductal dilatation. No peripancreatic fluid collection. Spleen: No splenic laceration or hematoma. Adrenals/Urinary Tract: --Adrenal glands: No adrenal hemorrhage. --Right kidney/ureter: There is a nonobstructing stone in the lower pole the right kidney. --Left kidney/ureter: There are nonobstructing stones involving the upper pole the left kidney. --Urinary bladder: Unremarkable. Stomach/Bowel: --Stomach/Duodenum: There is a large hiatal hernia. --Small bowel: No dilatation or inflammation. --Colon: There is a large soft tissue mass that appears to encompass much of the sigmoid colon. This mass measures approximately 13.2 x 4.5 cm. It does not appear to cause an obstruction at this time. --Appendix: Not visualized. No right lower quadrant inflammation or free fluid. Vascular/Lymphatic: Normal course and caliber of the major abdominal vessels. --there are few mildly prominent retroperitoneal lymph nodes. --there are enlarged mesenteric lymph nodes. --there is a soft tissue mass in the right hemipelvis measuring 1.1 cm which may represent a pathologically enlarged lymph node or metastatic deposit. Reproductive: There is a probable fibroid uterus. The ovaries are not clearly defined. Other: There are innumerable peritoneal/omental implants throughout the abdomen. There is a large amorphous soft tissue mass in the left hemipelvis closely associated with the patient's sigmoid colon. There is a trace amount of free fluid in the patient's abdomen and pelvis. Musculoskeletal. 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 There may be a pathologic fracture through the L2 vertebral body with minimal height loss. There is a prominent Schmorl's node involving the inferior endplate of the L4 vertebral body. There is metastatic disease  involving the pelvic bones. No definite displaced fracture through the greater trochanter of the right. IMPRESSION: CT CHEST, ABDOMEN, AND PELVIS: 1. There are pathologic fractures involving the T8 through T11 vertebral bodies, most severe at the T8 level. There is no significant retropulsion. 2. Extensive osseous metastatic disease is noted involving the visualized osseous structures as detailed above. There is a large soft tissue component at the T10 level likely resulting in some degree of spinal canal narrowing. 3. No large centrally located pulmonary embolus. There are few questionable filling defects at the segmental subsegmental levels that are not well evaluated on this exam secondary to technique. If there is clinical concern for pulmonary embolus, consider a follow-up PE study as clinically indicated. If there are pulmonary emboli, the overall volume is likely low. 4. New small right-sided pleural effusion. Bilateral atelectasis is noted. 5. Extensive soft tissue nodules are noted throughout the peritoneal cavity/omentum, consistent with widely metastatic disease. 6. Large amorphous soft tissue mass in the patient's left hemipelvis, closely associated with the sigmoid colon. 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 A second primary lesion seems less likely but is not entirely excluded. There is no evidence for obstruction at this time. 7. No convincing evidence for a fracture  through the greater trochanter of the right hip. 8. Additional chronic findings as detailed above. CT LUMBAR SPINE: 1. There is a pathologic fracture of the L2 vertebral body with minimal height loss and minimal retropulsion. 2. 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 Electronically Signed   By: Constance Holster M.D.   On: 02/20/2019 18:44   Ct L-spine No Charge  Result Date: 02/20/2019 CLINICAL DATA:  Acute pain due to trauma EXAM: CT  CHEST, ABDOMEN, AND PELVIS WITH CONTRAST CT LUMBAR SPINE WITH CONTRAST. TECHNIQUE: Multidetector CT imaging of the chest, abdomen and pelvis was performed following the standard protocol during bolus administration of intravenous contrast. Technique: Multiplanar CT images of the lumbar spine were reconstructed from contemporary CT of the Abdomen and Pelvis. CONTRAST:  141mL OMNIPAQUE IOHEXOL 300 MG/ML  SOLN COMPARISON:  None. FINDINGS: CT CHEST FINDINGS Cardiovascular: The heart size is normal. There is no significant pericardial effusion. No large centrally located pulmonary embolus. The thoracic aorta is not aneurysmal. There are few question of filling defects within segmental branches of the pulmonary artery which are not well evaluated on this exam. Mediastinum/Nodes: --there are few mildly prominent mediastinal and hilar lymph nodes that remain subcentimeter in size. --No axillary lymphadenopathy. --No supraclavicular lymphadenopathy. --Normal thyroid gland. --The esophagus is unremarkable Lungs/Pleura: There is no pneumothorax. There is a small right-sided pleural effusion which is new since prior PET-CT. There is atelectasis at the lung bases. There is presumed atelectasis involving portions of the right middle lobe and right lower lobe. There is atelectasis at the left lung base. Musculoskeletal: Bilateral breast implants are noted. Diffuse osseous metastatic disease is noted involving multiple ribs bilaterally. There is osseous metastatic disease involving the C7 vertebral body. This is only partially visualized. There is no definite pathologic fracture involving the ribs. There is a pathologic fracture involving the T8 through T11 vertebral bodies, worse at the T8 level. There are lytic lesions involving the sternum. There is extensive soft tissue destruction of the T10 vertebral body likely resulting in spinal canal narrowing. CT ABDOMEN PELVIS FINDINGS Hepatobiliary: There is a nodular surface contour  of the liver. Normal gallbladder.There is no biliary ductal dilation. Pancreas: Normal contours without ductal dilatation. No peripancreatic fluid collection. Spleen: No splenic laceration or hematoma. Adrenals/Urinary Tract: --Adrenal glands: No adrenal hemorrhage. --Right kidney/ureter: There is a nonobstructing stone in the lower pole the right kidney. --Left kidney/ureter: There are nonobstructing stones involving the upper pole the left kidney. --Urinary bladder: Unremarkable. Stomach/Bowel: --Stomach/Duodenum: There is a large hiatal hernia. --Small bowel: No dilatation or inflammation. --Colon: There is a large soft tissue mass that appears to encompass much of the sigmoid colon. This mass measures approximately 13.2 x 4.5 cm. It does not appear to cause an obstruction at this time. --Appendix: Not visualized. No right lower quadrant inflammation or free fluid. Vascular/Lymphatic: Normal course and caliber of the major abdominal vessels. --there are few mildly prominent retroperitoneal lymph nodes. --there are enlarged mesenteric lymph nodes. --there is a soft tissue mass in the right hemipelvis measuring 1.1 cm which may represent a pathologically enlarged lymph node or metastatic deposit. Reproductive: There is a probable fibroid uterus. The ovaries are not clearly defined. Other: There are innumerable peritoneal/omental implants throughout the abdomen. There is a large amorphous soft tissue mass in the left hemipelvis closely associated with the patient's sigmoid colon. There is a trace amount of free fluid in the patient's abdomen and pelvis. Musculoskeletal. Again noted is extensive metastatic disease  throughout the lumbar spine. There may be a pathologic fracture through the L2 vertebral body with minimal height loss. There is a prominent Schmorl's node involving the inferior endplate of the L4 vertebral body. There is metastatic disease involving the pelvic bones. No definite displaced fracture through  the greater trochanter of the right. IMPRESSION: CT CHEST, ABDOMEN, AND PELVIS: 1. There are pathologic fractures involving the T8 through T11 vertebral bodies, most severe at the T8 level. There is no significant retropulsion. 2. Extensive osseous metastatic disease is noted involving the visualized osseous structures as detailed above. There is a large soft tissue component at the T10 level likely resulting in some degree of spinal canal narrowing. 3. No large centrally located pulmonary embolus. There are few questionable filling defects at the segmental subsegmental levels that are not well evaluated on this exam secondary to technique. If there is clinical concern for pulmonary embolus, consider a follow-up PE study as clinically indicated. If there are pulmonary emboli, the overall volume is likely low. 4. New small right-sided pleural effusion. Bilateral atelectasis is noted. 5. Extensive soft tissue nodules are noted throughout the peritoneal cavity/omentum, consistent with widely metastatic disease. 6. Large amorphous soft tissue mass in the patient's left hemipelvis, closely associated with the sigmoid colon. 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 A second primary lesion seems less likely but is not entirely excluded. There is no evidence for obstruction at this time. 7. No convincing evidence for a fracture through the greater trochanter of the right hip. 8. Additional chronic findings as detailed above. CT LUMBAR SPINE: 1. There is a pathologic fracture of the L2 vertebral body with minimal height loss and minimal retropulsion. 2. 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 Electronically Signed   By: Constance Holster M.D.   On: 02/20/2019 18:44   Dg Hips Bilat W Or Wo Pelvis 3-4 Views  Addendum Date: 02/20/2019   ADDENDUM REPORT: 02/20/2019 15:36 ADDENDUM: Scattered heterogeneous appearance of the osseous  structures is likely related to the patient's known widely metastatic breast carcinoma. Electronically Signed   By: Constance Holster M.D.   On: 02/20/2019 15:36   Result Date: 02/20/2019 CLINICAL DATA:  Pain status post fall EXAM: DG HIP (WITH OR WITHOUT PELVIS) 3-4V BILAT COMPARISON:  None. FINDINGS: There is no acute displaced fracture or dislocation. There are advanced degenerative changes of both hips, right worse than left. There is a lucency through the greater trochanter of the right hip. IMPRESSION: 1. No definite acute displaced fracture or dislocation. 2. Lucency through the greater trochanter of the right hip, only visualized on a single view. This could represent a nondisplaced fracture or artifact. Correlation with point tenderness is recommended. 3. Osteoarthritis of both hips. Electronically Signed: By: Constance Holster M.D. On: 02/20/2019 15:25        Scheduled Meds:  fentaNYL  1 patch Transdermal Q72H   ferrous sulfate  325 mg Oral Q breakfast    morphine injection  1 mg Intravenous Once   tamoxifen  20 mg Oral Daily   Continuous Infusions:   LOS: 1 day   Time spent= 35 mins    Kimberley Speece Arsenio Loader, MD [facility]  If 7PM-7AM, please contact night-coverage [email or web address] 02/22/2019, 11:33 AM

## 2019-02-23 ENCOUNTER — Ambulatory Visit: Payer: Medicare Other

## 2019-02-23 DIAGNOSIS — I10 Essential (primary) hypertension: Secondary | ICD-10-CM

## 2019-02-23 LAB — PROTIME-INR
INR: 1 (ref 0.8–1.2)
Prothrombin Time: 13.2 seconds (ref 11.4–15.2)

## 2019-02-23 MED ORDER — CEPHALEXIN 500 MG PO CAPS: 500.0000 mg | ORAL_CAPSULE | Freq: Two times a day (BID) | ORAL | 0 refills | Status: AC

## 2019-02-23 MED ORDER — POLYETHYLENE GLYCOL 3350 17 G PO PACK: 17.0000 g | PACK | Freq: Every day | ORAL | 0 refills | Status: AC | PRN

## 2019-02-23 MED ORDER — OXYCODONE-ACETAMINOPHEN 5-325 MG PO TABS
1.0000 | ORAL_TABLET | ORAL | Status: DC | PRN
  Administered 2019-02-23: 2 via ORAL
  Filled 2019-02-23: qty 2

## 2019-02-23 MED ORDER — MORPHINE SULFATE (PF) 2 MG/ML IV SOLN
2.0000 mg | INTRAVENOUS | Status: DC | PRN
  Administered 2019-02-23 – 2019-02-25 (×3): 2 mg via INTRAVENOUS
  Filled 2019-02-23 (×3): qty 1

## 2019-02-23 MED ORDER — FENTANYL 75 MCG/HR TD PT72
1.0000 | MEDICATED_PATCH | TRANSDERMAL | Status: DC
  Administered 2019-02-23: 1 via TRANSDERMAL
  Filled 2019-02-23: qty 1

## 2019-02-23 MED ORDER — SENNOSIDES-DOCUSATE SODIUM 8.6-50 MG PO TABS: 2.0000 | ORAL_TABLET | Freq: Every evening | ORAL | Status: AC | PRN

## 2019-02-23 NOTE — Discharge Summary (Signed)
Physician Discharge Summary  Milanie Fest [id number] DOB: 18-Dec-1940 DOA: 02/20/2019  PCP: Kelton Pillar, MD  Admit date: 02/20/2019 Discharge date: 02/23/2019  Admitted From: Home Disposition: Hospice  Recommendations for Outpatient Follow-up:  1. Follow up with PCP in 1-2 weeks 2. Please obtain BMP/CBC in one week your next doctors visit.  3. Discontinue anticoagulation.  Complete course of antibiotic for UTI, short course-last day 9/5 4. Pain medications with bowel regimen  Discharge Condition: Stable CODE STATUS: DNR Diet recommendation: Comfort feeding  Brief/Interim Summary: 78 year old with metastatic breast cancer, extensive bony metastases status post radiation treatment, PE/DVT on Coumadin, fibromyalgia came to the hospital after sustaining mechanical fall.  Multiple fractures noted.  Work-up showed extensive lytic lesions throughout vertebrae.  Seen by oncology recommending hospice and pain control at this time.  Unfortunately from previous radiation patient is also having a lot of dysphagia therefore I recommended comfort feeding.  Given multiple pain medications due to extent of her metastases causing her excessive amounts of pain. At this time will focus on making her comfortable and transition her to residential hospice.   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  Previously failed treatment with chemo and radiation.  No longer a candidate for intervention.  Seen by  oncology, recommending hospice care.  Will transition patient to hospice. - On fentanyl, IV morphine, Robaxin and Percocet for pain control.  Aggressive bowel regimen.  Urinary tract infection with E. coli -Completed 3-day course of Keflex.  Last day 9/5  Hypercalcemia - Improved  Coagulopathy with supratherapeutic INR, initially greater than 10 -  Discontinue anticoagulation per patient and family request  History of PE/DVT -Discontinue anticoagulation per patient and family request   Consultations:  Oncology  Subjective: Still having pain with movement.  Complains of issues with swallowing ability due to her previous history of radiation.  She understands that there is no further options at this time.  Discharge Exam: Vitals:   02/22/19 2109 02/23/19 0500  BP: 115/71 123/76  Pulse: (!) 112 (!) 107  Resp: 17 17  Temp: 99.2 F (37.3 C) 98.9 F (37.2 C)  SpO2: 92% 94%   Vitals:   02/22/19 1549 02/22/19 1608 02/22/19 2109 02/23/19 0500  BP:  129/80 115/71 123/76  Pulse: (!) 113 (!) 116 (!) 112 (!) 107  Resp:   17 17  Temp:  98.8 F (37.1 C) 99.2 F (37.3 C) 98.9 F (37.2 C)  TempSrc:  Oral Oral Oral  SpO2:  91% 92% 94%  Weight:      Height:        General: Cachectic frail, chronically ill. Cardiovascular: RRR, S1/S2 +, no rubs, no gallops Respiratory: CTA bilaterally, no wheezing, no rhonchi Abdominal: Soft, NT, ND, bowel sounds + Extremities: no edema, no cyanosis  Discharge Instructions   Allergies as of 02/23/2019      Reactions   No Known Allergies       Medication List    STOP taking these medications   acetaminophen 325 MG tablet Commonly known as:  TYLENOL   CALCIUM ASCORBATE PO   fentaNYL 25 MCG/HR Commonly known as: DURAGESIC Replaced by: fentaNYL 50 MCG/HR   ferrous sulfate 325 (65 FE) MG tablet   palbociclib 100 MG tablet Commonly known as: Ibrance   tamoxifen 20 MG tablet Commonly known as: NOLVADEX   Vitamin D3 125 MCG  (5000 UT) Tabs   warfarin 2 MG tablet Commonly known as: COUMADIN     TAKE these medications   cephALEXin 500 MG capsule Commonly known as: KEFLEX Take 1 capsule (500 mg total) by mouth every 12 (twelve) hours for 1 day.   fentaNYL 50 MCG/HR Commonly known as: [place] 1 patch onto the skin every 3 (three) days. Start taking on: February 24, 2019 Replaces: fentaNYL 25 MCG/HR   methocarbamol 500 MG tablet Commonly known as: ROBAXIN Take 1 tablet (500 mg total) by mouth every 8 (eight) hours as needed for up to 5 days for muscle spasms.   oxyCODONE-acetaminophen 5-325 MG tablet Commonly known as: PERCOCET/ROXICET Take 1 tablet by mouth every 6 (six) hours as needed for up to 5 days for moderate pain or severe pain. What changed:   how much to take  reasons to take this   polyethylene glycol 17 g packet Commonly known as: MIRALAX / GLYCOLAX Take 17 g by mouth daily as needed for moderate constipation or severe constipation.   senna-docusate 8.6-50 MG tablet Commonly known as: Senokot-S Take 2 tablets by mouth at bedtime as needed for mild constipation or moderate constipation.       Allergies  Allergen Reactions  . No Known Allergies     You were cared for by a hospitalist during your hospital stay. If you have any questions about your discharge medications or the care you received while you were in the hospital after you are discharged, you can call the unit and asked to speak with the hospitalist on call if the hospitalist that took care of you is not available. Once you are discharged, your primary care physician will handle any further medical issues. Please note that no refills for any discharge medications will be authorized once you are discharged, as it is imperative that you return to your primary care physician (or establish a relationship with a primary care physician if you do not have one) for your aftercare needs so that they can reassess your need for  medications and monitor your lab values.   Procedures/Studies: Dg Chest 1 View  Result Date: 02/20/2019 CLINICAL DATA:  Pain status post fall EXAM: CHEST  1 VIEW COMPARISON:  CT dated 10/14/2014 FINDINGS: Heart size is mildly enlarged. Aortic calcifications are noted. There is scarring versus atelectasis in the right lower lung zone. There is no pneumothorax. There appear to be mildly displaced fractures involving the fifth and sixth ribs anterior laterally on the left. There are old healed right-sided rib fractures. IMPRESSION: 1. Findings suspicious for mildly displaced fractures involving the fifth and sixth ribs laterally on the left. 2. No pneumothorax. 3. Linear opacity involving the right lower lung zone, favored to represent atelectasis or scarring. 4. See separate T-spine dictation for complete thoracic spine findings. Electronically Signed   By: Constance Holster M.D.   On: 02/20/2019 15:28   Dg Thoracic Spine 2 View  Result Date: 02/20/2019 CLINICAL DATA:  Pain status post fall EXAM: THORACIC SPINE 2 VIEWS COMPARISON:  CT dated 10/14/2014.  PET-CT dated 01/23/2019. FINDINGS: There is severe height loss of the T8 vertebral body. There is height loss of  the T9 vertebral body. There is anterior wedging of the T11 vertebral body. The upper thoracic spine is poorly evaluated secondary to overlapping osseous structures. IMPRESSION: 1. Limited study secondary to patient positioning as detailed above. 2. Age-indeterminate compression fractures involving the T8, T9 and T11 vertebral bodies. Correlation with point tenderness is recommended. These appear to be new since PET-CT dated 01/23/2019. These may represent pathologic fractures in the setting of known widely metastatic breast carcinoma throughout the thoracolumbar spine. Electronically Signed   By: Constance Holster M.D.   On: 02/20/2019 15:34   Dg Lumbar Spine Complete  Result Date: 02/20/2019 CLINICAL DATA:  Pain status post fall EXAM: LUMBAR  SPINE - COMPLETE 4+ VIEW COMPARISON:  None. FINDINGS: There are multilevel degenerative changes throughout the lumbar spine. There is no evidence for displaced fracture. There is some irregularity of the T12 vertebral body which may represent the patient's known metastatic disease. IMPRESSION: 1. No acute fracture involving the lumbar spine. 2. Known metastatic disease is better appreciated on prior PET-CT. Electronically Signed   By: Constance Holster M.D.   On: 02/20/2019 15:35   Ct Head Wo Contrast  Result Date: 02/20/2019 CLINICAL DATA:  Head trauma. Widespread skeletal metastasis demonstrated on recent PET-CT scan. History of breast cancer EXAM: CT HEAD WITHOUT CONTRAST TECHNIQUE: Contiguous axial images were obtained from the base of the skull through the vertex without intravenous contrast. COMPARISON:  PET-CT 01/23/2019 FINDINGS: Brain: No acute intracranial hemorrhage. No focal mass lesion. No CT evidence of acute infarction. No midline shift or mass effect. No hydrocephalus. Basilar cisterns are patent. Vascular: No hyperdense vessel or unexpected calcification. Skull: Aggressive calvarial metastasis replaces the inner and outer table of broad 4 cm segment of the LEFT temporal bone. There is soft tissue expansion of the medullary space with complete destruction of the bony elements. Expansion measures 1.3 cm in width. This expansion mildly effaces the LEFT temporal lobe. Multiple additional lytic lesions are scattered throughout the calvarium. Lytic lesion in the LEFT sphenoid bone (image 6/2). Sinuses/Orbits: No acute finding. Other: None IMPRESSION: 1. Aggressive lytic lesion in the LEFT temporal lobe replaces the inner and outer cortex over a 4 cm segment with soft tissue expansion mildly effacing the temporal lobe. 2. Multiple additional lytic lesions throughout the calvarium and sphenoid bone. 3. No intracranial trauma. 4. Widespread hypermetabolic skeletal metastasis demonstrated on PET-CT scan  01/23/2019. Electronically Signed   By: Suzy Bouchard M.D.   On: 02/20/2019 15:28   Ct Chest W Contrast  Result Date: 02/20/2019 CLINICAL DATA:  Acute pain due to trauma EXAM: CT CHEST, ABDOMEN, AND PELVIS WITH CONTRAST CT LUMBAR SPINE WITH CONTRAST. TECHNIQUE: Multidetector CT imaging of the chest, abdomen and pelvis was performed following the standard protocol during bolus administration of intravenous contrast. Technique: Multiplanar CT images of the lumbar spine were reconstructed from contemporary CT of the Abdomen and Pelvis. CONTRAST:  171mL OMNIPAQUE IOHEXOL 300 MG/ML  SOLN COMPARISON:  None. FINDINGS: CT CHEST FINDINGS Cardiovascular: The heart size is normal. There is no significant pericardial effusion. No large centrally located pulmonary embolus. The thoracic aorta is not aneurysmal. There are few question of filling defects within segmental branches of the pulmonary artery which are not well evaluated on this exam. Mediastinum/Nodes: --there are few mildly prominent mediastinal and hilar lymph nodes that remain subcentimeter in size. --No axillary lymphadenopathy. --No supraclavicular lymphadenopathy. --Normal thyroid gland. --The esophagus is unremarkable Lungs/Pleura: There is no pneumothorax. There is a small right-sided pleural effusion which is new  since prior PET-CT. There is atelectasis at the lung bases. There is presumed atelectasis involving portions of the right middle lobe and right lower lobe. There is atelectasis at the left lung base. Musculoskeletal: Bilateral breast implants are noted. Diffuse osseous metastatic disease is noted involving multiple ribs bilaterally. There is osseous metastatic disease involving the C7 vertebral body. This is only partially visualized. There is no definite pathologic fracture involving the ribs. There is a pathologic fracture involving the T8 through T11 vertebral bodies, worse at the T8 level. There are lytic lesions involving the sternum. There  is extensive soft tissue destruction of the T10 vertebral body likely resulting in spinal canal narrowing. CT ABDOMEN PELVIS FINDINGS Hepatobiliary: There is a nodular surface contour of the liver. Normal gallbladder.There is no biliary ductal dilation. Pancreas: Normal contours without ductal dilatation. No peripancreatic fluid collection. Spleen: No splenic laceration or hematoma. Adrenals/Urinary Tract: --Adrenal glands: No adrenal hemorrhage. --Right kidney/ureter: There is a nonobstructing stone in the lower pole the right kidney. --Left kidney/ureter: There are nonobstructing stones involving the upper pole the left kidney. --Urinary bladder: Unremarkable. Stomach/Bowel: --Stomach/Duodenum: There is a large hiatal hernia. --Small bowel: No dilatation or inflammation. --Colon: There is a large soft tissue mass that appears to encompass much of the sigmoid colon. This mass measures approximately 13.2 x 4.5 cm. It does not appear to cause an obstruction at this time. --Appendix: Not visualized. No right lower quadrant inflammation or free fluid. Vascular/Lymphatic: Normal course and caliber of the major abdominal vessels. --there are few mildly prominent retroperitoneal lymph nodes. --there are enlarged mesenteric lymph nodes. --there is a soft tissue mass in the right hemipelvis measuring 1.1 cm which may represent a pathologically enlarged lymph node or metastatic deposit. Reproductive: There is a probable fibroid uterus. The ovaries are not clearly defined. Other: There are innumerable peritoneal/omental implants throughout the abdomen. There is a large amorphous soft tissue mass in the left hemipelvis closely associated with the patient's sigmoid colon. There is a trace amount of free fluid in the patient's abdomen and pelvis. Musculoskeletal. 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 There may be a pathologic fracture through the L2 vertebral body with minimal height loss.  There is a prominent Schmorl's node involving the inferior endplate of the L4 vertebral body. There is metastatic disease involving the pelvic bones. No definite displaced fracture through the greater trochanter of the right. IMPRESSION: CT CHEST, ABDOMEN, AND PELVIS: 1. There are pathologic fractures involving the T8 through T11 vertebral bodies, most severe at the T8 level. There is no significant retropulsion. 2. Extensive osseous metastatic disease is noted involving the visualized osseous structures as detailed above. There is a large soft tissue component at the T10 level likely resulting in some degree of spinal canal narrowing. 3. No large centrally located pulmonary embolus. There are few questionable filling defects at the segmental subsegmental levels that are not well evaluated on this exam secondary to technique. If there is clinical concern for pulmonary embolus, consider a follow-up PE study as clinically indicated. If there are pulmonary emboli, the overall volume is likely low. 4. New small right-sided pleural effusion. Bilateral atelectasis is noted. 5. Extensive soft tissue nodules are noted throughout the peritoneal cavity/omentum, consistent with widely metastatic disease. 6. Large amorphous soft tissue mass in the patient's left hemipelvis, closely associated with the sigmoid colon. 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 A second primary lesion seems less likely but is not  entirely excluded. There is no evidence for obstruction at this time. 7. No convincing evidence for a fracture through the greater trochanter of the right hip. 8. Additional chronic findings as detailed above. CT LUMBAR SPINE: 1. There is a pathologic fracture of the L2 vertebral body with minimal height loss and minimal retropulsion. 2. 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 Electronically Signed   By: Constance Holster M.D.   On:  02/20/2019 18:44   Ct Cervical Spine Wo Contrast  Result Date: 02/20/2019 CLINICAL DATA:  Pain status post trauma EXAM: CT CERVICAL SPINE WITHOUT CONTRAST TECHNIQUE: Multidetector CT imaging of the cervical spine was performed without intravenous contrast. Multiplanar CT image reconstructions were also generated. COMPARISON:  None. FINDINGS: Alignment: Normal. Skull base and vertebrae: There appears to be a large partially visualized lytic lesion involving the left calvarium. This is better visualized on prior CT head. There are additional lytic lesions throughout the visualized osseous structures including at the skull base. There are extensive lytic lesions involving virtually all of the visualized cervical vertebral bodies. Soft tissues and spinal canal: No prevertebral fluid or swelling. No visible canal hematoma. Disc levels: There is multilevel disc height loss throughout the cervical spine Upper chest: Negative. Other: None. IMPRESSION: There are extensive lytic lesions throughout the cervical spine and skull base without convincing evidence for a pathologic fracture. Electronically Signed   By: Constance Holster M.D.   On: 02/20/2019 19:00   Ct Abdomen Pelvis W Contrast  Result Date: 02/20/2019 CLINICAL DATA:  Acute pain due to trauma EXAM: CT CHEST, ABDOMEN, AND PELVIS WITH CONTRAST CT LUMBAR SPINE WITH CONTRAST. TECHNIQUE: Multidetector CT imaging of the chest, abdomen and pelvis was performed following the standard protocol during bolus administration of intravenous contrast. Technique: Multiplanar CT images of the lumbar spine were reconstructed from contemporary CT of the Abdomen and Pelvis. CONTRAST:  177mL OMNIPAQUE IOHEXOL 300 MG/ML  SOLN COMPARISON:  None. FINDINGS: CT CHEST FINDINGS Cardiovascular: The heart size is normal. There is no significant pericardial effusion. No large centrally located pulmonary embolus. The thoracic aorta is not aneurysmal. There are few question of filling  defects within segmental branches of the pulmonary artery which are not well evaluated on this exam. Mediastinum/Nodes: --there are few mildly prominent mediastinal and hilar lymph nodes that remain subcentimeter in size. --No axillary lymphadenopathy. --No supraclavicular lymphadenopathy. --Normal thyroid gland. --The esophagus is unremarkable Lungs/Pleura: There is no pneumothorax. There is a small right-sided pleural effusion which is new since prior PET-CT. There is atelectasis at the lung bases. There is presumed atelectasis involving portions of the right middle lobe and right lower lobe. There is atelectasis at the left lung base. Musculoskeletal: Bilateral breast implants are noted. Diffuse osseous metastatic disease is noted involving multiple ribs bilaterally. There is osseous metastatic disease involving the C7 vertebral body. This is only partially visualized. There is no definite pathologic fracture involving the ribs. There is a pathologic fracture involving the T8 through T11 vertebral bodies, worse at the T8 level. There are lytic lesions involving the sternum. There is extensive soft tissue destruction of the T10 vertebral body likely resulting in spinal canal narrowing. CT ABDOMEN PELVIS FINDINGS Hepatobiliary: There is a nodular surface contour of the liver. Normal gallbladder.There is no biliary ductal dilation. Pancreas: Normal contours without ductal dilatation. No peripancreatic fluid collection. Spleen: No splenic laceration or hematoma. Adrenals/Urinary Tract: --Adrenal glands: No adrenal hemorrhage. --Right kidney/ureter: There is a nonobstructing stone in the lower pole the  right kidney. --Left kidney/ureter: There are nonobstructing stones involving the upper pole the left kidney. --Urinary bladder: Unremarkable. Stomach/Bowel: --Stomach/Duodenum: There is a large hiatal hernia. --Small bowel: No dilatation or inflammation. --Colon: There is a large soft tissue mass that appears to  encompass much of the sigmoid colon. This mass measures approximately 13.2 x 4.5 cm. It does not appear to cause an obstruction at this time. --Appendix: Not visualized. No right lower quadrant inflammation or free fluid. Vascular/Lymphatic: Normal course and caliber of the major abdominal vessels. --there are few mildly prominent retroperitoneal lymph nodes. --there are enlarged mesenteric lymph nodes. --there is a soft tissue mass in the right hemipelvis measuring 1.1 cm which may represent a pathologically enlarged lymph node or metastatic deposit. Reproductive: There is a probable fibroid uterus. The ovaries are not clearly defined. Other: There are innumerable peritoneal/omental implants throughout the abdomen. There is a large amorphous soft tissue mass in the left hemipelvis closely associated with the patient's sigmoid colon. There is a trace amount of free fluid in the patient's abdomen and pelvis. Musculoskeletal. 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 There may be a pathologic fracture through the L2 vertebral body with minimal height loss. There is a prominent Schmorl's node involving the inferior endplate of the L4 vertebral body. There is metastatic disease involving the pelvic bones. No definite displaced fracture through the greater trochanter of the right. IMPRESSION: CT CHEST, ABDOMEN, AND PELVIS: 1. There are pathologic fractures involving the T8 through T11 vertebral bodies, most severe at the T8 level. There is no significant retropulsion. 2. Extensive osseous metastatic disease is noted involving the visualized osseous structures as detailed above. There is a large soft tissue component at the T10 level likely resulting in some degree of spinal canal narrowing. 3. No large centrally located pulmonary embolus. There are few questionable filling defects at the segmental subsegmental levels that are not well evaluated on this exam secondary to technique. If there  is clinical concern for pulmonary embolus, consider a follow-up PE study as clinically indicated. If there are pulmonary emboli, the overall volume is likely low. 4. New small right-sided pleural effusion. Bilateral atelectasis is noted. 5. Extensive soft tissue nodules are noted throughout the peritoneal cavity/omentum, consistent with widely metastatic disease. 6. Large amorphous soft tissue mass in the patient's left hemipelvis, closely associated with the sigmoid colon. 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 A second primary lesion seems less likely but is not entirely excluded. There is no evidence for obstruction at this time. 7. No convincing evidence for a fracture through the greater trochanter of the right hip. 8. Additional chronic findings as detailed above. CT LUMBAR SPINE: 1. There is a pathologic fracture of the L2 vertebral body with minimal height loss and minimal retropulsion. 2. 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 Electronically Signed   By: Constance Holster M.D.   On: 02/20/2019 18:44   Ct L-spine No Charge  Result Date: 02/20/2019 CLINICAL DATA:  Acute pain due to trauma EXAM: CT CHEST, ABDOMEN, AND PELVIS WITH CONTRAST CT LUMBAR SPINE WITH CONTRAST. TECHNIQUE: Multidetector CT imaging of the chest, abdomen and pelvis was performed following the standard protocol during bolus administration of intravenous contrast. Technique: Multiplanar CT images of the lumbar spine were reconstructed from contemporary CT of the Abdomen and Pelvis. CONTRAST:  135mL OMNIPAQUE IOHEXOL 300 MG/ML  SOLN COMPARISON:  None. FINDINGS: CT CHEST FINDINGS Cardiovascular: The heart  size is normal. There is no significant pericardial effusion. No large centrally located pulmonary embolus. The thoracic aorta is not aneurysmal. There are few question of filling defects within segmental branches of the pulmonary artery which  are not well evaluated on this exam. Mediastinum/Nodes: --there are few mildly prominent mediastinal and hilar lymph nodes that remain subcentimeter in size. --No axillary lymphadenopathy. --No supraclavicular lymphadenopathy. --Normal thyroid gland. --The esophagus is unremarkable Lungs/Pleura: There is no pneumothorax. There is a small right-sided pleural effusion which is new since prior PET-CT. There is atelectasis at the lung bases. There is presumed atelectasis involving portions of the right middle lobe and right lower lobe. There is atelectasis at the left lung base. Musculoskeletal: Bilateral breast implants are noted. Diffuse osseous metastatic disease is noted involving multiple ribs bilaterally. There is osseous metastatic disease involving the C7 vertebral body. This is only partially visualized. There is no definite pathologic fracture involving the ribs. There is a pathologic fracture involving the T8 through T11 vertebral bodies, worse at the T8 level. There are lytic lesions involving the sternum. There is extensive soft tissue destruction of the T10 vertebral body likely resulting in spinal canal narrowing. CT ABDOMEN PELVIS FINDINGS Hepatobiliary: There is a nodular surface contour of the liver. Normal gallbladder.There is no biliary ductal dilation. Pancreas: Normal contours without ductal dilatation. No peripancreatic fluid collection. Spleen: No splenic laceration or hematoma. Adrenals/Urinary Tract: --Adrenal glands: No adrenal hemorrhage. --Right kidney/ureter: There is a nonobstructing stone in the lower pole the right kidney. --Left kidney/ureter: There are nonobstructing stones involving the upper pole the left kidney. --Urinary bladder: Unremarkable. Stomach/Bowel: --Stomach/Duodenum: There is a large hiatal hernia. --Small bowel: No dilatation or inflammation. --Colon: There is a large soft tissue mass that appears to encompass much of the sigmoid colon. This mass measures approximately  13.2 x 4.5 cm. It does not appear to cause an obstruction at this time. --Appendix: Not visualized. No right lower quadrant inflammation or free fluid. Vascular/Lymphatic: Normal course and caliber of the major abdominal vessels. --there are few mildly prominent retroperitoneal lymph nodes. --there are enlarged mesenteric lymph nodes. --there is a soft tissue mass in the right hemipelvis measuring 1.1 cm which may represent a pathologically enlarged lymph node or metastatic deposit. Reproductive: There is a probable fibroid uterus. The ovaries are not clearly defined. Other: There are innumerable peritoneal/omental implants throughout the abdomen. There is a large amorphous soft tissue mass in the left hemipelvis closely associated with the patient's sigmoid colon. There is a trace amount of free fluid in the patient's abdomen and pelvis. Musculoskeletal. 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 There may be a pathologic fracture through the L2 vertebral body with minimal height loss. There is a prominent Schmorl's node involving the inferior endplate of the L4 vertebral body. There is metastatic disease involving the pelvic bones. No definite displaced fracture through the greater trochanter of the right. IMPRESSION: CT CHEST, ABDOMEN, AND PELVIS: 1. There are pathologic fractures involving the T8 through T11 vertebral bodies, most severe at the T8 level. There is no significant retropulsion. 2. Extensive osseous metastatic disease is noted involving the visualized osseous structures as detailed above. There is a large soft tissue component at the T10 level likely resulting in some degree of spinal canal narrowing. 3. No large centrally located pulmonary embolus. There are few questionable filling defects at the segmental subsegmental levels that are not well evaluated on this exam secondary to technique. If there is clinical concern for  pulmonary embolus, consider a follow-up PE  study as clinically indicated. If there are pulmonary emboli, the overall volume is likely low. 4. New small right-sided pleural effusion. Bilateral atelectasis is noted. 5. Extensive soft tissue nodules are noted throughout the peritoneal cavity/omentum, consistent with widely metastatic disease. 6. Large amorphous soft tissue mass in the patient's left hemipelvis, closely associated with the sigmoid colon. 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 A second primary lesion seems less likely but is not entirely excluded. There is no evidence for obstruction at this time. 7. No convincing evidence for a fracture through the greater trochanter of the right hip. 8. Additional chronic findings as detailed above. CT LUMBAR SPINE: 1. There is a pathologic fracture of the L2 vertebral body with minimal height loss and minimal retropulsion. 2. 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 Electronically Signed   By: Constance Holster M.D.   On: 02/20/2019 18:44   Dg Hips Bilat W Or Wo Pelvis 3-4 Views  Addendum Date: 02/20/2019   ADDENDUM REPORT: 02/20/2019 15:36 ADDENDUM: Scattered heterogeneous appearance of the osseous structures is likely related to the patient's known widely metastatic breast carcinoma. Electronically Signed   By: Constance Holster M.D.   On: 02/20/2019 15:36   Result Date: 02/20/2019 CLINICAL DATA:  Pain status post fall EXAM: DG HIP (WITH OR WITHOUT PELVIS) 3-4V BILAT COMPARISON:  None. FINDINGS: There is no acute displaced fracture or dislocation. There are advanced degenerative changes of both hips, right worse than left. There is a lucency through the greater trochanter of the right hip. IMPRESSION: 1. No definite acute displaced fracture or dislocation. 2. Lucency through the greater trochanter of the right hip, only visualized on a single view. This could represent a nondisplaced fracture or artifact.  Correlation with point tenderness is recommended. 3. Osteoarthritis of both hips. Electronically Signed: By: Constance Holster M.D. On: 02/20/2019 15:25     The results of significant diagnostics from this hospitalization (including imaging, microbiology, ancillary and laboratory) are listed below for reference.     Microbiology: Recent Results (from the past 240 hour(s))  Urine culture     Status: Abnormal   Collection Time: 02/20/19  2:21 PM   Specimen: Urine, Random  Result Value Ref Range Status   Specimen Description URINE, RANDOM  Final   Special Requests   Final    NONE Performed at [facility], [address], [place], [place] [address]    Culture 80,000 COLONIES/mL ESCHERICHIA COLI (A)  Final   Report Status 02/22/2019 FINAL  Final   Organism ID, Bacteria ESCHERICHIA COLI (A)  Final      Susceptibility   Escherichia coli - MIC*    AMPICILLIN <=2 SENSITIVE Sensitive     CEFAZOLIN <=4 SENSITIVE Sensitive     CEFTRIAXONE <=1 SENSITIVE Sensitive     CIPROFLOXACIN <=0.25 SENSITIVE Sensitive     GENTAMICIN <=1 SENSITIVE Sensitive     IMIPENEM <=0.25 SENSITIVE Sensitive     NITROFURANTOIN <=16 SENSITIVE Sensitive     TRIMETH/SULFA <=20 SENSITIVE Sensitive     AMPICILLIN/SULBACTAM <=2 SENSITIVE Sensitive     PIP/TAZO <=4 SENSITIVE Sensitive     Extended ESBL NEGATIVE Sensitive     * 80,000 COLONIES/mL ESCHERICHIA COLI  SARS Coronavirus 2 [facility] order, Performed in [facility] hospital lab) Nasopharyngeal Nasopharyngeal Swab     Status: None   Collection Time: 02/20/19  9:44 PM   Specimen: Nasopharyngeal Swab  Result Value Ref Range Status   SARS Coronavirus 2 NEGATIVE NEGATIVE Final    Comment: (NOTE) If result is NEGATIVE SARS-CoV-2 target nucleic acids are NOT DETECTED. The SARS-CoV-2 RNA is generally detectable in upper and lower  respiratory specimens during the acute phase of infection. The lowest  concentration of SARS-CoV-2 viral copies this assay can  detect is 250  copies / mL. A negative result does not preclude SARS-CoV-2 infection  and should not be used as the sole basis for treatment or other  patient management decisions.  A negative result may occur with  improper specimen collection / handling, submission of specimen other  than nasopharyngeal swab, presence of viral mutation(s) within the  areas targeted by this assay, and inadequate number of viral copies  (<250 copies / mL). A negative result must be combined with clinical  observations, patient history, and epidemiological information. If result is POSITIVE SARS-CoV-2 target nucleic acids are DETECTED. The SARS-CoV-2 RNA is generally detectable in upper and lower  respiratory specimens dur ing the acute phase of infection.  Positive  results are indicative of active infection with SARS-CoV-2.  Clinical  correlation with patient history and other diagnostic information is  necessary to determine patient infection status.  Positive results do  not rule out bacterial infection or co-infection with other viruses. If result is PRESUMPTIVE POSTIVE SARS-CoV-2 nucleic acids MAY BE PRESENT.   A presumptive positive result was obtained on the submitted specimen  and confirmed on repeat testing.  While 2019 novel coronavirus  (SARS-CoV-2) nucleic acids may be present in the submitted sample  additional confirmatory testing may be necessary for epidemiological  and / or clinical management purposes  to differentiate between  SARS-CoV-2 and other Sarbecovirus currently known to infect humans.  If clinically indicated additional testing with an alternate test  methodology [phone]) is advised. The SARS-CoV-2 RNA is generally  detectable in upper and lower respiratory sp ecimens during the acute  phase of infection. The expected result is Negative. Fact Sheet for Patients:  StrictlyIdeas.no Fact Sheet for Healthcare  Providers: BankingDealers.co.za This test is not yet approved or cleared by the [place] FDA and has been authorized for detection and/or diagnosis of SARS-CoV-2 by FDA under an Emergency Use Authorization (EUA).  This EUA will remain in effect (meaning this test can be used) for the duration of the COVID-19 declaration under Section 564(b)(1) of the Act, 21 U.S.C. section 360bbb-3(b)(1), unless the authorization is terminated or revoked sooner. Performed at [facility], [place] [address] [place], [place] [address]      Labs: BNP (last 3 results) No results for input(s): BNP in the last 8760 hours. Basic Metabolic Panel: Recent Labs  Lab 02/20/19 1311 02/21/19 0229 02/22/19 0348  NA 141 142 137  K 3.4* 3.4* 4.0  CL 98 102 99  CO2 30 30 28   GLUCOSE 147* 157* 160*  BUN 22 19 16   CREATININE 0.92 0.87 0.81  CALCIUM 11.5* 10.7* 10.0  MG  --  1.9  --    Liver Function Tests: Recent Labs  Lab 02/20/19 1311 02/22/19 0348  AST 44* 50*  ALT 28 27  ALKPHOS 136* 123  BILITOT 1.1 1.2  PROT 5.8* 4.7*  ALBUMIN 2.9* 2.1*   No results for input(s): LIPASE, AMYLASE in the last 168 hours. No results for input(s): AMMONIA in the last 168 hours. CBC: Recent Labs  Lab 02/20/19 1311 02/21/19 0229 02/22/19 0348  WBC 3.3* 3.0* 2.4*  NEUTROABS 3.0  --   --  HGB 13.1 11.0* 10.1*  HCT 42.1 35.1* 32.3*  MCV 92.7 91.6 92.3  PLT 149* 128* 113*   Cardiac Enzymes: Recent Labs  Lab 02/20/19 1600  CKTOTAL 62   BNP: Invalid input(s): POCBNP CBG: No results for input(s): GLUCAP in the last 168 hours. D-Dimer No results for input(s): DDIMER in the last 72 hours. Hgb A1c No results for input(s): HGBA1C in the last 72 hours. Lipid Profile No results for input(s): CHOL, HDL, LDLCALC, TRIG, CHOLHDL, LDLDIRECT in the last 72 hours. Thyroid function studies No results for input(s): TSH, T4TOTAL, T3FREE, THYROIDAB in the last 72 hours.  Invalid  input(s): FREET3 Anemia work up No results for input(s): VITAMINB12, FOLATE, FERRITIN, TIBC, IRON, RETICCTPCT in the last 72 hours. Urinalysis    Component Value Date/Time   COLORURINE YELLOW 02/20/2019 1732   APPEARANCEUR CLEAR 02/20/2019 1732   LABSPEC 1.016 02/20/2019 1732   LABSPEC 1.005 01/24/2014 1311   PHURINE 6.0 02/20/2019 1732   GLUCOSEU NEGATIVE 02/20/2019 1732   GLUCOSEU Negative 02/11/2014 1311   HGBUR NEGATIVE 02/20/2019 1732   BILIRUBINUR NEGATIVE 02/20/2019 1732   BILIRUBINUR Negative 02/09/2014 1311   KETONESUR 5 (A) 02/20/2019 1732   PROTEINUR NEGATIVE 02/20/2019 1732   UROBILINOGEN 0.2 10/19/2014 0429   UROBILINOGEN 0.2 01/31/2014 1311   NITRITE NEGATIVE 02/20/2019 1732   LEUKOCYTESUR NEGATIVE 02/20/2019 1732   LEUKOCYTESUR Moderate 01/20/2014 1311   Sepsis Labs Invalid input(s): PROCALCITONIN,  WBC,  LACTICIDVEN Microbiology Recent Results (from the past 240 hour(s))  Urine culture     Status: Abnormal   Collection Time: 02/20/19  2:21 PM   Specimen: Urine, Random  Result Value Ref Range Status   Specimen Description URINE, RANDOM  Final   Special Requests   Final    NONE Performed at [facility], [place] [address], [place], [place] [address]    Culture 80,000 COLONIES/mL ESCHERICHIA COLI (A)  Final   Report Status 02/22/2019 FINAL  Final   Organism ID, Bacteria ESCHERICHIA COLI (A)  Final      Susceptibility   Escherichia coli - MIC*    AMPICILLIN <=2 SENSITIVE Sensitive     CEFAZOLIN <=4 SENSITIVE Sensitive     CEFTRIAXONE <=1 SENSITIVE Sensitive     CIPROFLOXACIN <=0.25 SENSITIVE Sensitive     GENTAMICIN <=1 SENSITIVE Sensitive     IMIPENEM <=0.25 SENSITIVE Sensitive     NITROFURANTOIN <=16 SENSITIVE Sensitive     TRIMETH/SULFA <=20 SENSITIVE Sensitive     AMPICILLIN/SULBACTAM <=2 SENSITIVE Sensitive     PIP/TAZO <=4 SENSITIVE Sensitive     Extended ESBL NEGATIVE Sensitive     * 80,000 COLONIES/mL ESCHERICHIA COLI  SARS Coronavirus 2  [facility] order, Performed in [facility] hospital lab) Nasopharyngeal Nasopharyngeal Swab     Status: None   Collection Time: 02/20/19  9:44 PM   Specimen: Nasopharyngeal Swab  Result Value Ref Range Status   SARS Coronavirus 2 NEGATIVE NEGATIVE Final    Comment: (NOTE) If result is NEGATIVE SARS-CoV-2 target nucleic acids are NOT DETECTED. The SARS-CoV-2 RNA is generally detectable in upper and lower  respiratory specimens during the acute phase of infection. The lowest  concentration of SARS-CoV-2 viral copies this assay can detect is 250  copies / mL. A negative result does not preclude SARS-CoV-2 infection  and should not be used as the sole basis for treatment or other  patient management decisions.  A negative result may occur with  improper specimen collection / handling, submission of specimen other  than nasopharyngeal swab, presence of viral mutation(s) within the  areas targeted by this assay, and inadequate number of viral copies  (<250 copies / mL). A negative result must be combined with clinical  observations, patient history, and epidemiological information. If result is POSITIVE SARS-CoV-2 target nucleic acids are DETECTED. The SARS-CoV-2 RNA is generally detectable in upper and lower  respiratory specimens dur ing the acute phase of infection.  Positive  results are indicative of active infection with SARS-CoV-2.  Clinical  correlation with patient history and other diagnostic information is  necessary to determine patient infection status.  Positive results do  not rule out bacterial infection or co-infection with other viruses. If result is PRESUMPTIVE POSTIVE SARS-CoV-2 nucleic acids MAY BE PRESENT.   A presumptive positive result was obtained on the submitted specimen  and confirmed on repeat testing.  While 2019 novel coronavirus  (SARS-CoV-2) nucleic acids may be present in the submitted sample  additional confirmatory testing may be necessary for  epidemiological  and / or clinical management purposes  to differentiate between  SARS-CoV-2 and other Sarbecovirus currently known to infect humans.  If clinically indicated additional testing with an alternate test  methodology [phone]) is advised. The SARS-CoV-2 RNA is generally  detectable in upper and lower respiratory sp ecimens during the acute  phase of infection. The expected result is Negative. Fact Sheet for Patients:  StrictlyIdeas.no Fact Sheet for Healthcare Providers: BankingDealers.co.za This test is not yet approved or cleared by the [place] FDA and has been authorized for detection and/or diagnosis of SARS-CoV-2 by FDA under an Emergency Use Authorization (EUA).  This EUA will remain in effect (meaning this test can be used) for the duration of the COVID-19 declaration under Section 564(b)(1) of the Act, 21 U.S.C. section 360bbb-3(b)(1), unless the authorization is terminated or revoked sooner. Performed at [facility], [place] [address], [place], [place] [address]      Time coordinating discharge:  I have spent 35 minutes face to face with the patient and on the ward discussing the patients care, assessment, plan and disposition with other care givers. >50% of the time was devoted counseling the patient about the risks and benefits of treatment/Discharge disposition and coordinating care.   SIGNED:   Damita Lack, MD  [facility] 02/23/2019, 11:33 AM   If 7PM-7AM, please contact night-coverage [email or web address]

## 2019-02-23 NOTE — Progress Notes (Addendum)
PT Cancellation Note/Discharge Note  Patient Details Name: [name] MRN: [id number] DOB: 17-Mar-1941   Cancelled Treatment:    Reason Eval/Treat Not Completed: Other (comment).  Based on team's notes, family is seeking full comfort care for Ms. [name] (and placement at [facility]), and based on therapy evaluation notes PT is not going to make her comfortable at all.  PT to sign off.    Thanks,  Barbarann Ehlers. Antanette Richwine, PT, DPT  Acute Rehabilitation [phone] pager [phone] office  @ [email or web address]: [phone]     Harvie Heck 02/23/2019, 10:58 AM

## 2019-02-23 NOTE — TOC Progression Note (Addendum)
Transition of Care [facility]) - Progression Note    Patient Details  Name: [name] MRN: [id number] Date of Birth: April 24, 1941  Transition of Care [facility]) CM/SW [place], [place] Phone Number: 02/23/2019, 10:53 AM  Clinical Narrative:    CSW has f/u with [facility] liaison this morning regarding bed availability. There remains no beds available. Will request MD consult PMT for pain mgmt while pt is inpatient.    Expected Discharge Plan: Skilled Nursing Facility Barriers to Discharge: Continued Medical Work up, Ship broker, Environmental education officer)  Expected Discharge Plan and Services Expected Discharge Plan: [place] In-house Referral: Clinical Social Work Discharge Planning Services: CM Consult Post Acute Care Choice: [place] arrangements for the past 2 months: Single Family Home Expected Discharge Date: 02/23/19                 Social Determinants of Health (SDOH) Interventions    Readmission Risk Interventions No flowsheet data found.

## 2019-02-24 LAB — PROTIME-INR
INR: 1 (ref 0.8–1.2)
Prothrombin Time: 13 seconds (ref 11.4–15.2)

## 2019-02-24 MED ORDER — MORPHINE SULFATE (CONCENTRATE) 10 MG/0.5ML PO SOLN
10.0000 mg | ORAL | Status: DC | PRN
  Administered 2019-02-24 (×3): 10 mg via ORAL
  Filled 2019-02-24 (×3): qty 0.5

## 2019-02-24 MED ORDER — BISACODYL 10 MG RE SUPP: 10.0000 mg | Freq: Every day | RECTAL | Status: DC | PRN

## 2019-02-24 MED ORDER — CEPASTAT 14.5 MG MT LOZG
1.0000 | LOZENGE | OROMUCOSAL | Status: DC | PRN
  Filled 2019-02-24: qty 9

## 2019-02-24 MED ORDER — MENTHOL 3 MG MT LOZG: 1.0000 | LOZENGE | OROMUCOSAL | Status: DC | PRN

## 2019-02-24 NOTE — Progress Notes (Signed)
PROGRESS NOTE    Aarionna Azizi  [id number] DOB: [birth year] DOA: 02/20/2019 PCP: Kelton Pillar, MD   Brief Narrative:  78 year old with metastatic breast cancer, extensive bony metastases status post radiation treatment, PE/DVT on Coumadin, fibromyalgia came to the hospital after sustaining mechanical fall.  Multiple fractures noted.  Work-up showed extensive lytic lesions throughout vertebrae.  Seen by oncology recommending hospice and pain control at this time.  Pain medications increased due to severe pain   Assessment & Plan:   Principal Problem:   Fall Active Problems:   Chronic anemia   Pulmonary embolism (HCC)   DVT of lower extremity, bilateral (HCC)   Essential hypertension   Carcinoma of right breast metastatic to multiple sites [facility])   Hypercalcemia   Hypokalemia   Supratherapeutic INR   Left rib fracture   Vertebral fracture, pathological   Rib fractures   Acute pain secondary to extensive bony metastases/pathologic vertebral fractures T8-T11, L2 Multiple rib fractures Metastatic breast cancer - Aggressive metastatic disease.  Seen by oncology-no further option for chemo or radiation at this time.  Not a candidate for any sort of surgical intervention.  Incentive spirometer - We will transition patient to hospice when bed available -Pain medications prescribed including liquid morphine -Bowel regimen including suppository. -Changed her diet to liquid.  Feed as tolerated.  Urinary tract infection with E. coli -Completed 3-day course of Keflex  Hypercalcemia - Improved  Coagulopathy with supratherapeutic INR, initially greater than 10 - Discontinue anticoagulation per family and patient request  History of PE/DVT -Discontinue anticoagulation per patient and family request  Poor prognosis  DVT prophylaxis: None Code Status: DNR Disposition Plan: Hospice when bed available    Consultants:   Oncology  Procedures:   None  Antimicrobials:    None   Subjective: Reporting of generalized pain but better control.  Requesting supple call for her sore throat and also requesting to change her diet to liquid.  Review of Systems Otherwise negative except as per HPI, including: General = no fevers, chills, dizziness, malaise, fatigue HEENT/EYES = negative for pain, redness, loss of vision, double vision, blurred vision, loss of hearing, sore throat, hoarseness, dysphagia Cardiovascular= negative for chest pain, palpitation, murmurs, lower extremity swelling Respiratory/lungs= negative for shortness of breath, cough, hemoptysis, wheezing, mucus production Gastrointestinal= negative for nausea, vomiting,, abdominal pain, melena, hematemesis Genitourinary= negative for Dysuria, Hematuria, Change in Urinary Frequency MSK = Negative for arthralgia, myalgias, Back Pain, Joint swelling  Neurology= Negative for headache, seizures, numbness, tingling  Psychiatry= Negative for anxiety, depression, suicidal and homocidal ideation Allergy/Immunology= Medication/Food allergy as listed  Skin= Negative for Rash, lesions, ulcers, itching  Objective: Vitals:   02/23/19 2230 02/24/19 0050 02/24/19 0413 02/24/19 0453  BP:  (!) 148/84 (!) 138/98   Pulse: (!) 117 (!) 113 (!) 124 (!) 114  Resp:  20 18 18   Temp:   (!) 100.4 F (38 C) 98.3 F (36.8 C)  TempSrc:   Oral Oral  SpO2: 95% 97% 100% 99%  Weight:      Height:        Intake/Output Summary (Last 24 hours) at 02/24/2019 0857 Last data filed at 02/24/2019 0631 Gross per 24 hour  Intake 960 ml  Output 250 ml  Net 710 ml   Filed Weights   02/20/19 2227  Weight: 74.1 kg    Examination:  Constitutional: Cachectic frail, chronically ill.  In mild distress due to pain Eyes: PERRL, lids and conjunctivae normal ENMT: Mucous membranes are moist. Posterior pharynx clear  of any exudate or lesions.Normal dentition.  Neck: normal, supple, no masses, no thyromegaly Respiratory: clear to  auscultation bilaterally, no wheezing, no crackles. Normal respiratory effort. No accessory muscle use.  Cardiovascular: Regular rate and rhythm, no murmurs / rubs / gallops. No extremity edema. 2+ pedal pulses. No carotid bruits.  Abdomen: no tenderness, no masses palpated. No hepatosplenomegaly. Bowel sounds positive.  Musculoskeletal: no clubbing / cyanosis. No joint deformity upper and lower extremities. Good ROM, no contractures. Normal muscle tone.  Skin: no rashes, lesions, ulcers. No induration Neurologic: CN 2-12 grossly intact. Sensation intact, DTR normal. Strength 4/5 in all 4.  Psychiatric: Normal judgment and insight. Alert and oriented x 3. Normal mood.     Data Reviewed:   CBC: Recent Labs  Lab 02/20/19 1311 02/21/19 0229 02/22/19 0348  WBC 3.3* 3.0* 2.4*  NEUTROABS 3.0  --   --   HGB 13.1 11.0* 10.1*  HCT 42.1 35.1* 32.3*  MCV 92.7 91.6 92.3  PLT 149* 128* 123456*   Basic Metabolic Panel: Recent Labs  Lab 02/20/19 1311 02/21/19 0229 02/22/19 0348  NA 141 142 137  K 3.4* 3.4* 4.0  CL 98 102 99  CO2 30 30 28   GLUCOSE 147* 157* 160*  BUN 22 19 16   CREATININE 0.92 0.87 0.81  CALCIUM 11.5* 10.7* 10.0  MG  --  1.9  --    GFR: Estimated Creatinine Clearance: 56.1 mL/min (by C-G formula based on SCr of 0.81 mg/dL). Liver Function Tests: Recent Labs  Lab 02/20/19 1311 02/22/19 0348  AST 44* 50*  ALT 28 27  ALKPHOS 136* 123  BILITOT 1.1 1.2  PROT 5.8* 4.7*  ALBUMIN 2.9* 2.1*   No results for input(s): LIPASE, AMYLASE in the last 168 hours. No results for input(s): AMMONIA in the last 168 hours. Coagulation Profile: Recent Labs  Lab 02/20/19 1532 02/21/19 0229 02/22/19 0348 02/23/19 0326 02/24/19 0347  INR >10.0* >10.0* 1.1 1.0 1.0   Cardiac Enzymes: Recent Labs  Lab 02/20/19 1600  CKTOTAL 62   BNP (last 3 results) No results for input(s): PROBNP in the last 8760 hours. HbA1C: No results for input(s): HGBA1C in the last 72 hours. CBG:  No results for input(s): GLUCAP in the last 168 hours. Lipid Profile: No results for input(s): CHOL, HDL, LDLCALC, TRIG, CHOLHDL, LDLDIRECT in the last 72 hours. Thyroid Function Tests: No results for input(s): TSH, T4TOTAL, FREET4, T3FREE, THYROIDAB in the last 72 hours. Anemia Panel: No results for input(s): VITAMINB12, FOLATE, FERRITIN, TIBC, IRON, RETICCTPCT in the last 72 hours. Sepsis Labs: No results for input(s): PROCALCITON, LATICACIDVEN in the last 168 hours.  Recent Results (from the past 240 hour(s))  Urine culture     Status: Abnormal   Collection Time: 02/20/19  2:21 PM   Specimen: Urine, Random  Result Value Ref Range Status   Specimen Description URINE, RANDOM  Final   Special Requests   Final    NONE Performed at [facility], [address], [place], [place] [address]    Culture 80,000 COLONIES/mL ESCHERICHIA COLI (A)  Final   Report Status 02/22/2019 FINAL  Final   Organism ID, Bacteria ESCHERICHIA COLI (A)  Final      Susceptibility   Escherichia coli - MIC*    AMPICILLIN <=2 SENSITIVE Sensitive     CEFAZOLIN <=4 SENSITIVE Sensitive     CEFTRIAXONE <=1 SENSITIVE Sensitive     CIPROFLOXACIN <=0.25 SENSITIVE Sensitive     GENTAMICIN <=1 SENSITIVE Sensitive  IMIPENEM <=0.25 SENSITIVE Sensitive     NITROFURANTOIN <=16 SENSITIVE Sensitive     TRIMETH/SULFA <=20 SENSITIVE Sensitive     AMPICILLIN/SULBACTAM <=2 SENSITIVE Sensitive     PIP/TAZO <=4 SENSITIVE Sensitive     Extended ESBL NEGATIVE Sensitive     * 80,000 COLONIES/mL ESCHERICHIA COLI  SARS Coronavirus 2 [facility] order, Performed in [facility] hospital lab) Nasopharyngeal Nasopharyngeal Swab     Status: None   Collection Time: 02/20/19  9:44 PM   Specimen: Nasopharyngeal Swab  Result Value Ref Range Status   SARS Coronavirus 2 NEGATIVE NEGATIVE Final    Comment: (NOTE) If result is NEGATIVE SARS-CoV-2 target nucleic acids are NOT DETECTED. The SARS-CoV-2 RNA is generally detectable in  upper and lower  respiratory specimens during the acute phase of infection. The lowest  concentration of SARS-CoV-2 viral copies this assay can detect is 250  copies / mL. A negative result does not preclude SARS-CoV-2 infection  and should not be used as the sole basis for treatment or other  patient management decisions.  A negative result may occur with  improper specimen collection / handling, submission of specimen other  than nasopharyngeal swab, presence of viral mutation(s) within the  areas targeted by this assay, and inadequate number of viral copies  (<250 copies / mL). A negative result must be combined with clinical  observations, patient history, and epidemiological information. If result is POSITIVE SARS-CoV-2 target nucleic acids are DETECTED. The SARS-CoV-2 RNA is generally detectable in upper and lower  respiratory specimens dur ing the acute phase of infection.  Positive  results are indicative of active infection with SARS-CoV-2.  Clinical  correlation with patient history and other diagnostic information is  necessary to determine patient infection status.  Positive results do  not rule out bacterial infection or co-infection with other viruses. If result is PRESUMPTIVE POSTIVE SARS-CoV-2 nucleic acids MAY BE PRESENT.   A presumptive positive result was obtained on the submitted specimen  and confirmed on repeat testing.  While 2019 novel coronavirus  (SARS-CoV-2) nucleic acids may be present in the submitted sample  additional confirmatory testing may be necessary for epidemiological  and / or clinical management purposes  to differentiate between  SARS-CoV-2 and other Sarbecovirus currently known to infect humans.  If clinically indicated additional testing with an alternate test  methodology [phone]) is advised. The SARS-CoV-2 RNA is generally  detectable in upper and lower respiratory sp ecimens during the acute  phase of infection. The expected result is  Negative. Fact Sheet for Patients:  StrictlyIdeas.no Fact Sheet for Healthcare Providers: BankingDealers.co.za This test is not yet approved or cleared by the [place] FDA and has been authorized for detection and/or diagnosis of SARS-CoV-2 by FDA under an Emergency Use Authorization (EUA).  This EUA will remain in effect (meaning this test can be used) for the duration of the COVID-19 declaration under Section 564(b)(1) of the Act, 21 U.S.C. section 360bbb-3(b)(1), unless the authorization is terminated or revoked sooner. Performed at [facility], [place] [address], [place], [place] [address]          Radiology Studies: No results found.      Scheduled Meds: . cephALEXin  500 mg Oral Q12H  . fentaNYL  1 patch Transdermal Q72H  .  morphine injection  1 mg Intravenous Once   Continuous Infusions: . sodium chloride       LOS: 3 days   Time spent= 15 mins    Caliah Kopke Chirag Gates Jividen,  MD [facility]  If 7PM-7AM, please contact night-coverage [email or web address] 02/24/2019, 8:57 AM

## 2019-02-24 NOTE — TOC Progression Note (Addendum)
Transition of Care [facility]) - Progression Note    Patient Details  Name: Loghan Mariconda MRN: [id number] Date of Birth: 11/19/1940  Transition of Care [facility]) CM/SW [place], [place] Phone Number: 02/24/2019, 8:36 AM  Clinical Narrative:    10:37am- CSW spoke with Harmon Pier, unfortunately no bed availability today  8:36am- Spoke with Harmon Pier at [facility], she is waiting for update on today's bed availability and will be in touch with CSW if one becomes available.   Expected Discharge Plan: [place] Barriers to Discharge: Hospice Bed not available  Expected Discharge Plan and Services Expected Discharge Plan: [place] In-house Referral: Clinical Social Work Discharge Planning Services: CM Consult Post Acute Care Choice: [place] arrangements for the past 2 months: Single Family Home Expected Discharge Date: 02/24/19                 Social Determinants of Health (SDOH) Interventions    Readmission Risk Interventions No flowsheet data found.

## 2019-02-24 NOTE — Progress Notes (Signed)
Called pharmacy regarding pt fentanyl patch removal on work list / discontinuation  Pharamacist stated to leave on till the 7th Pt aware

## 2019-02-25 DIAGNOSIS — I2782 Chronic pulmonary embolism: Secondary | ICD-10-CM

## 2019-02-25 LAB — PROTIME-INR
INR: 1 (ref 0.8–1.2)
Prothrombin Time: 13 seconds (ref 11.4–15.2)

## 2019-02-25 NOTE — Progress Notes (Signed)
Report called and given to Doreen Salvage at [facility] over the phone.  Will provide the patient with pain medication before transfer and transport and then remove peripheral IV per [facility] request.

## 2019-02-25 NOTE — Progress Notes (Signed)
Patient will not allow staff to move her unless she gives the ok.  Patient was medicated around 2300 with morphine solution and patient would not allow staff to give her a bed bath - patient refused x 3.

## 2019-02-25 NOTE — Discharge Summary (Signed)
Physician Discharge Summary  Analyah Arroyo [id number] DOB: [birth year] DOA: 02/20/2019  PCP: Kelton Pillar, MD  Admit date: 02/20/2019 Discharge date: 02/25/2019  Admitted From: Home Disposition:  Hospice  Recommendations for Outpatient Follow-up:  1. Follow up with PCP in 1-2 weeks 2. Pain meds prescribed.    Discharge Condition: Stable CODE STATUS: DNR Diet recommendation: Comfort  Brief/Interim Summary: 78 year old with metastatic breast cancer, extensive bony metastases status post radiation treatment, PE/DVT on Coumadin, fibromyalgia came to the hospital after sustaining mechanical fall.  Multiple fractures noted.  Work-up showed extensive lytic lesions throughout vertebrae.  Seen by oncology recommending hospice and pain control at this time.  Pain medications increased due to severe pain.  Anticoagulation discontinued.  Transition to hospice.   Discharge Diagnoses:  Principal Problem:   Fall Active Problems:   Chronic anemia   Pulmonary embolism (HCC)   DVT of lower extremity, bilateral (HCC)   Essential hypertension   Carcinoma of right breast metastatic to multiple sites [facility])   Hypercalcemia   Hypokalemia   Supratherapeutic INR   Left rib fracture   Vertebral fracture, pathological   Rib fractures   Acute pain secondary to extensive bony metastases/pathologic vertebral fractures T8-T11, L2 Multiple rib fractures Metastatic breast cancer - Aggressive metastatic cancer.  Seen by oncology-no further options for chemo or radiation.  Not a good surgical candidate -Aggressive incentive spirometer use recommended. - We will transition patient to hospice when bed available -Pain medications including liquid morphine. -Bowel regimen including suppository. -Changed her diet to liquid.  Feed as tolerated.  Advised comfort feeding and use basic aspiration precaution  Urinary tract infection with E. coli -Completed 3-day course of Keflex  Hypercalcemia -  Improved  Coagulopathy with supratherapeutic INR, initially greater than 10 - Discontinue anticoagulation per family and patient request  History of PE/DVT -Discontinue anticoagulation per patient and family request  Overall she has poor prognosis  Consultations:  Hospice  Subjective: Complains of generalized pain but currently controlled  Discharge Exam: Vitals:   02/24/19 2036 02/25/19 0501  BP: 129/89 126/82  Pulse: (!) 111 (!) 110  Resp: 20 16  Temp: 98.9 F (37.2 C) 97.9 F (36.6 C)  SpO2: 97% 97%   Vitals:   02/24/19 1427 02/24/19 1453 02/24/19 2036 02/25/19 0501  BP: 130/78  129/89 126/82  Pulse: (!) 116  (!) 111 (!) 110  Resp:  20 20 16   Temp: 98.9 F (37.2 C)  98.9 F (37.2 C) 97.9 F (36.6 C)  TempSrc: Oral  Oral Oral  SpO2: (!) 89% 95% 97% 97%  Weight:      Height:        General: Pt is alert, awake, not in acute distress Cardiovascular: RRR, S1/S2 +, no rubs, no gallops Respiratory: CTA bilaterally, no wheezing, no rhonchi Abdominal: Soft, NT, ND, bowel sounds + Extremities: no edema, no cyanosis  Discharge Instructions   Allergies as of 02/25/2019      Reactions   No Known Allergies       Medication List    STOP taking these medications   acetaminophen 325 MG tablet Commonly known as: TYLENOL   CALCIUM ASCORBATE PO   fentaNYL 25 MCG/HR Commonly known as: DURAGESIC Replaced by: fentaNYL 50 MCG/HR   ferrous sulfate 325 (65 FE) MG tablet   palbociclib 100 MG tablet Commonly known as: Ibrance   tamoxifen 20 MG tablet Commonly known as: NOLVADEX   Vitamin D3 125 MCG (5000 UT) Tabs   warfarin 2 MG tablet Commonly known  as: COUMADIN     TAKE these medications   fentaNYL 50 MCG/HR Commonly known as: [place] 1 patch onto the skin every 3 (three) days. Replaces: fentaNYL 25 MCG/HR   methocarbamol 500 MG tablet Commonly known as: ROBAXIN Take 1 tablet (500 mg total) by mouth every 8 (eight) hours as needed for up to  5 days for muscle spasms.   oxyCODONE-acetaminophen 5-325 MG tablet Commonly known as: PERCOCET/ROXICET Take 1 tablet by mouth every 6 (six) hours as needed for up to 5 days for moderate pain or severe pain. What changed:   how much to take  reasons to take this   polyethylene glycol 17 g packet Commonly known as: MIRALAX / GLYCOLAX Take 17 g by mouth daily as needed for moderate constipation or severe constipation.   senna-docusate 8.6-50 MG tablet Commonly known as: Senokot-S Take 2 tablets by mouth at bedtime as needed for mild constipation or moderate constipation.     ASK your doctor about these medications   cephALEXin 500 MG capsule Commonly known as: KEFLEX Take 1 capsule (500 mg total) by mouth every 12 (twelve) hours for 1 day. Ask about: Should I take this medication?       Allergies  Allergen Reactions  . No Known Allergies     You were cared for by a hospitalist during your hospital stay. If you have any questions about your discharge medications or the care you received while you were in the hospital after you are discharged, you can call the unit and asked to speak with the hospitalist on call if the hospitalist that took care of you is not available. Once you are discharged, your primary care physician will handle any further medical issues. Please note that no refills for any discharge medications will be authorized once you are discharged, as it is imperative that you return to your primary care physician (or establish a relationship with a primary care physician if you do not have one) for your aftercare needs so that they can reassess your need for medications and monitor your lab values.   Procedures/Studies: Dg Chest 1 View  Result Date: 02/20/2019 CLINICAL DATA:  Pain status post fall EXAM: CHEST  1 VIEW COMPARISON:  CT dated 10/14/2014 FINDINGS: Heart size is mildly enlarged. Aortic calcifications are noted. There is scarring versus atelectasis in the  right lower lung zone. There is no pneumothorax. There appear to be mildly displaced fractures involving the fifth and sixth ribs anterior laterally on the left. There are old healed right-sided rib fractures. IMPRESSION: 1. Findings suspicious for mildly displaced fractures involving the fifth and sixth ribs laterally on the left. 2. No pneumothorax. 3. Linear opacity involving the right lower lung zone, favored to represent atelectasis or scarring. 4. See separate T-spine dictation for complete thoracic spine findings. Electronically Signed   By: Constance Holster M.D.   On: 02/20/2019 15:28   Dg Thoracic Spine 2 View  Result Date: 02/20/2019 CLINICAL DATA:  Pain status post fall EXAM: THORACIC SPINE 2 VIEWS COMPARISON:  CT dated 10/14/2014.  PET-CT dated 01/23/2019. FINDINGS: There is severe height loss of the T8 vertebral body. There is height loss of the T9 vertebral body. There is anterior wedging of the T11 vertebral body. The upper thoracic spine is poorly evaluated secondary to overlapping osseous structures. IMPRESSION: 1. Limited study secondary to patient positioning as detailed above. 2. Age-indeterminate compression fractures involving the T8, T9 and T11 vertebral bodies. Correlation with point tenderness is recommended. These  appear to be new since PET-CT dated 01/23/2019. These may represent pathologic fractures in the setting of known widely metastatic breast carcinoma throughout the thoracolumbar spine. Electronically Signed   By: Constance Holster M.D.   On: 02/20/2019 15:34   Dg Lumbar Spine Complete  Result Date: 02/20/2019 CLINICAL DATA:  Pain status post fall EXAM: LUMBAR SPINE - COMPLETE 4+ VIEW COMPARISON:  None. FINDINGS: There are multilevel degenerative changes throughout the lumbar spine. There is no evidence for displaced fracture. There is some irregularity of the T12 vertebral body which may represent the patient's known metastatic disease. IMPRESSION: 1. No acute fracture  involving the lumbar spine. 2. Known metastatic disease is better appreciated on prior PET-CT. Electronically Signed   By: Constance Holster M.D.   On: 02/20/2019 15:35   Ct Head Wo Contrast  Result Date: 02/20/2019 CLINICAL DATA:  Head trauma. Widespread skeletal metastasis demonstrated on recent PET-CT scan. History of breast cancer EXAM: CT HEAD WITHOUT CONTRAST TECHNIQUE: Contiguous axial images were obtained from the base of the skull through the vertex without intravenous contrast. COMPARISON:  PET-CT 01/23/2019 FINDINGS: Brain: No acute intracranial hemorrhage. No focal mass lesion. No CT evidence of acute infarction. No midline shift or mass effect. No hydrocephalus. Basilar cisterns are patent. Vascular: No hyperdense vessel or unexpected calcification. Skull: Aggressive calvarial metastasis replaces the inner and outer table of broad 4 cm segment of the LEFT temporal bone. There is soft tissue expansion of the medullary space with complete destruction of the bony elements. Expansion measures 1.3 cm in width. This expansion mildly effaces the LEFT temporal lobe. Multiple additional lytic lesions are scattered throughout the calvarium. Lytic lesion in the LEFT sphenoid bone (image 6/2). Sinuses/Orbits: No acute finding. Other: None IMPRESSION: 1. Aggressive lytic lesion in the LEFT temporal lobe replaces the inner and outer cortex over a 4 cm segment with soft tissue expansion mildly effacing the temporal lobe. 2. Multiple additional lytic lesions throughout the calvarium and sphenoid bone. 3. No intracranial trauma. 4. Widespread hypermetabolic skeletal metastasis demonstrated on PET-CT scan 01/23/2019. Electronically Signed   By: Suzy Bouchard M.D.   On: 02/20/2019 15:28   Ct Chest W Contrast  Result Date: 02/20/2019 CLINICAL DATA:  Acute pain due to trauma EXAM: CT CHEST, ABDOMEN, AND PELVIS WITH CONTRAST CT LUMBAR SPINE WITH CONTRAST. TECHNIQUE: Multidetector CT imaging of the chest, abdomen  and pelvis was performed following the standard protocol during bolus administration of intravenous contrast. Technique: Multiplanar CT images of the lumbar spine were reconstructed from contemporary CT of the Abdomen and Pelvis. CONTRAST:  174mL OMNIPAQUE IOHEXOL 300 MG/ML  SOLN COMPARISON:  None. FINDINGS: CT CHEST FINDINGS Cardiovascular: The heart size is normal. There is no significant pericardial effusion. No large centrally located pulmonary embolus. The thoracic aorta is not aneurysmal. There are few question of filling defects within segmental branches of the pulmonary artery which are not well evaluated on this exam. Mediastinum/Nodes: --there are few mildly prominent mediastinal and hilar lymph nodes that remain subcentimeter in size. --No axillary lymphadenopathy. --No supraclavicular lymphadenopathy. --Normal thyroid gland. --The esophagus is unremarkable Lungs/Pleura: There is no pneumothorax. There is a small right-sided pleural effusion which is new since prior PET-CT. There is atelectasis at the lung bases. There is presumed atelectasis involving portions of the right middle lobe and right lower lobe. There is atelectasis at the left lung base. Musculoskeletal: Bilateral breast implants are noted. Diffuse osseous metastatic disease is noted involving multiple ribs bilaterally. There is osseous metastatic disease involving  the C7 vertebral body. This is only partially visualized. There is no definite pathologic fracture involving the ribs. There is a pathologic fracture involving the T8 through T11 vertebral bodies, worse at the T8 level. There are lytic lesions involving the sternum. There is extensive soft tissue destruction of the T10 vertebral body likely resulting in spinal canal narrowing. CT ABDOMEN PELVIS FINDINGS Hepatobiliary: There is a nodular surface contour of the liver. Normal gallbladder.There is no biliary ductal dilation. Pancreas: Normal contours without ductal dilatation. No  peripancreatic fluid collection. Spleen: No splenic laceration or hematoma. Adrenals/Urinary Tract: --Adrenal glands: No adrenal hemorrhage. --Right kidney/ureter: There is a nonobstructing stone in the lower pole the right kidney. --Left kidney/ureter: There are nonobstructing stones involving the upper pole the left kidney. --Urinary bladder: Unremarkable. Stomach/Bowel: --Stomach/Duodenum: There is a large hiatal hernia. --Small bowel: No dilatation or inflammation. --Colon: There is a large soft tissue mass that appears to encompass much of the sigmoid colon. This mass measures approximately 13.2 x 4.5 cm. It does not appear to cause an obstruction at this time. --Appendix: Not visualized. No right lower quadrant inflammation or free fluid. Vascular/Lymphatic: Normal course and caliber of the major abdominal vessels. --there are few mildly prominent retroperitoneal lymph nodes. --there are enlarged mesenteric lymph nodes. --there is a soft tissue mass in the right hemipelvis measuring 1.1 cm which may represent a pathologically enlarged lymph node or metastatic deposit. Reproductive: There is a probable fibroid uterus. The ovaries are not clearly defined. Other: There are innumerable peritoneal/omental implants throughout the abdomen. There is a large amorphous soft tissue mass in the left hemipelvis closely associated with the patient's sigmoid colon. There is a trace amount of free fluid in the patient's abdomen and pelvis. Musculoskeletal. 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 There may be a pathologic fracture through the L2 vertebral body with minimal height loss. There is a prominent Schmorl's node involving the inferior endplate of the L4 vertebral body. There is metastatic disease involving the pelvic bones. No definite displaced fracture through the greater trochanter of the right. IMPRESSION: CT CHEST, ABDOMEN, AND PELVIS: 1. There are pathologic fractures involving  the T8 through T11 vertebral bodies, most severe at the T8 level. There is no significant retropulsion. 2. Extensive osseous metastatic disease is noted involving the visualized osseous structures as detailed above. There is a large soft tissue component at the T10 level likely resulting in some degree of spinal canal narrowing. 3. No large centrally located pulmonary embolus. There are few questionable filling defects at the segmental subsegmental levels that are not well evaluated on this exam secondary to technique. If there is clinical concern for pulmonary embolus, consider a follow-up PE study as clinically indicated. If there are pulmonary emboli, the overall volume is likely low. 4. New small right-sided pleural effusion. Bilateral atelectasis is noted. 5. Extensive soft tissue nodules are noted throughout the peritoneal cavity/omentum, consistent with widely metastatic disease. 6. Large amorphous soft tissue mass in the patient's left hemipelvis, closely associated with the sigmoid colon. 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 A second primary lesion seems less likely but is not entirely excluded. There is no evidence for obstruction at this time. 7. No convincing evidence for a fracture through the greater trochanter of the right hip. 8. Additional chronic findings as detailed above. CT LUMBAR SPINE: 1. There is a pathologic fracture of the L2 vertebral body with minimal height loss and minimal retropulsion. 2.  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 Electronically Signed   By: Constance Holster M.D.   On: 02/20/2019 18:44   Ct Cervical Spine Wo Contrast  Result Date: 02/20/2019 CLINICAL DATA:  Pain status post trauma EXAM: CT CERVICAL SPINE WITHOUT CONTRAST TECHNIQUE: Multidetector CT imaging of the cervical spine was performed without intravenous contrast. Multiplanar CT image reconstructions were also  generated. COMPARISON:  None. FINDINGS: Alignment: Normal. Skull base and vertebrae: There appears to be a large partially visualized lytic lesion involving the left calvarium. This is better visualized on prior CT head. There are additional lytic lesions throughout the visualized osseous structures including at the skull base. There are extensive lytic lesions involving virtually all of the visualized cervical vertebral bodies. Soft tissues and spinal canal: No prevertebral fluid or swelling. No visible canal hematoma. Disc levels: There is multilevel disc height loss throughout the cervical spine Upper chest: Negative. Other: None. IMPRESSION: There are extensive lytic lesions throughout the cervical spine and skull base without convincing evidence for a pathologic fracture. Electronically Signed   By: Constance Holster M.D.   On: 02/20/2019 19:00   Ct Abdomen Pelvis W Contrast  Result Date: 02/20/2019 CLINICAL DATA:  Acute pain due to trauma EXAM: CT CHEST, ABDOMEN, AND PELVIS WITH CONTRAST CT LUMBAR SPINE WITH CONTRAST. TECHNIQUE: Multidetector CT imaging of the chest, abdomen and pelvis was performed following the standard protocol during bolus administration of intravenous contrast. Technique: Multiplanar CT images of the lumbar spine were reconstructed from contemporary CT of the Abdomen and Pelvis. CONTRAST:  159mL OMNIPAQUE IOHEXOL 300 MG/ML  SOLN COMPARISON:  None. FINDINGS: CT CHEST FINDINGS Cardiovascular: The heart size is normal. There is no significant pericardial effusion. No large centrally located pulmonary embolus. The thoracic aorta is not aneurysmal. There are few question of filling defects within segmental branches of the pulmonary artery which are not well evaluated on this exam. Mediastinum/Nodes: --there are few mildly prominent mediastinal and hilar lymph nodes that remain subcentimeter in size. --No axillary lymphadenopathy. --No supraclavicular lymphadenopathy. --Normal thyroid  gland. --The esophagus is unremarkable Lungs/Pleura: There is no pneumothorax. There is a small right-sided pleural effusion which is new since prior PET-CT. There is atelectasis at the lung bases. There is presumed atelectasis involving portions of the right middle lobe and right lower lobe. There is atelectasis at the left lung base. Musculoskeletal: Bilateral breast implants are noted. Diffuse osseous metastatic disease is noted involving multiple ribs bilaterally. There is osseous metastatic disease involving the C7 vertebral body. This is only partially visualized. There is no definite pathologic fracture involving the ribs. There is a pathologic fracture involving the T8 through T11 vertebral bodies, worse at the T8 level. There are lytic lesions involving the sternum. There is extensive soft tissue destruction of the T10 vertebral body likely resulting in spinal canal narrowing. CT ABDOMEN PELVIS FINDINGS Hepatobiliary: There is a nodular surface contour of the liver. Normal gallbladder.There is no biliary ductal dilation. Pancreas: Normal contours without ductal dilatation. No peripancreatic fluid collection. Spleen: No splenic laceration or hematoma. Adrenals/Urinary Tract: --Adrenal glands: No adrenal hemorrhage. --Right kidney/ureter: There is a nonobstructing stone in the lower pole the right kidney. --Left kidney/ureter: There are nonobstructing stones involving the upper pole the left kidney. --Urinary bladder: Unremarkable. Stomach/Bowel: --Stomach/Duodenum: There is a large hiatal hernia. --Small bowel: No dilatation or inflammation. --Colon: There is a large soft tissue mass that appears to encompass much of the sigmoid colon. This mass measures approximately 13.2 x  4.5 cm. It does not appear to cause an obstruction at this time. --Appendix: Not visualized. No right lower quadrant inflammation or free fluid. Vascular/Lymphatic: Normal course and caliber of the major abdominal vessels. --there are  few mildly prominent retroperitoneal lymph nodes. --there are enlarged mesenteric lymph nodes. --there is a soft tissue mass in the right hemipelvis measuring 1.1 cm which may represent a pathologically enlarged lymph node or metastatic deposit. Reproductive: There is a probable fibroid uterus. The ovaries are not clearly defined. Other: There are innumerable peritoneal/omental implants throughout the abdomen. There is a large amorphous soft tissue mass in the left hemipelvis closely associated with the patient's sigmoid colon. There is a trace amount of free fluid in the patient's abdomen and pelvis. Musculoskeletal. 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 There may be a pathologic fracture through the L2 vertebral body with minimal height loss. There is a prominent Schmorl's node involving the inferior endplate of the L4 vertebral body. There is metastatic disease involving the pelvic bones. No definite displaced fracture through the greater trochanter of the right. IMPRESSION: CT CHEST, ABDOMEN, AND PELVIS: 1. There are pathologic fractures involving the T8 through T11 vertebral bodies, most severe at the T8 level. There is no significant retropulsion. 2. Extensive osseous metastatic disease is noted involving the visualized osseous structures as detailed above. There is a large soft tissue component at the T10 level likely resulting in some degree of spinal canal narrowing. 3. No large centrally located pulmonary embolus. There are few questionable filling defects at the segmental subsegmental levels that are not well evaluated on this exam secondary to technique. If there is clinical concern for pulmonary embolus, consider a follow-up PE study as clinically indicated. If there are pulmonary emboli, the overall volume is likely low. 4. New small right-sided pleural effusion. Bilateral atelectasis is noted. 5. Extensive soft tissue nodules are noted throughout the peritoneal  cavity/omentum, consistent with widely metastatic disease. 6. Large amorphous soft tissue mass in the patient's left hemipelvis, closely associated with the sigmoid colon. 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 A second primary lesion seems less likely but is not entirely excluded. There is no evidence for obstruction at this time. 7. No convincing evidence for a fracture through the greater trochanter of the right hip. 8. Additional chronic findings as detailed above. CT LUMBAR SPINE: 1. There is a pathologic fracture of the L2 vertebral body with minimal height loss and minimal retropulsion. 2. 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 Electronically Signed   By: Constance Holster M.D.   On: 02/20/2019 18:44   Ct L-spine No Charge  Result Date: 02/20/2019 CLINICAL DATA:  Acute pain due to trauma EXAM: CT CHEST, ABDOMEN, AND PELVIS WITH CONTRAST CT LUMBAR SPINE WITH CONTRAST. TECHNIQUE: Multidetector CT imaging of the chest, abdomen and pelvis was performed following the standard protocol during bolus administration of intravenous contrast. Technique: Multiplanar CT images of the lumbar spine were reconstructed from contemporary CT of the Abdomen and Pelvis. CONTRAST:  112mL OMNIPAQUE IOHEXOL 300 MG/ML  SOLN COMPARISON:  None. FINDINGS: CT CHEST FINDINGS Cardiovascular: The heart size is normal. There is no significant pericardial effusion. No large centrally located pulmonary embolus. The thoracic aorta is not aneurysmal. There are few question of filling defects within segmental branches of the pulmonary artery which are not well evaluated on this exam. Mediastinum/Nodes: --there are few mildly prominent mediastinal and hilar lymph nodes that  remain subcentimeter in size. --No axillary lymphadenopathy. --No supraclavicular lymphadenopathy. --Normal thyroid gland. --The esophagus is unremarkable Lungs/Pleura: There is no  pneumothorax. There is a small right-sided pleural effusion which is new since prior PET-CT. There is atelectasis at the lung bases. There is presumed atelectasis involving portions of the right middle lobe and right lower lobe. There is atelectasis at the left lung base. Musculoskeletal: Bilateral breast implants are noted. Diffuse osseous metastatic disease is noted involving multiple ribs bilaterally. There is osseous metastatic disease involving the C7 vertebral body. This is only partially visualized. There is no definite pathologic fracture involving the ribs. There is a pathologic fracture involving the T8 through T11 vertebral bodies, worse at the T8 level. There are lytic lesions involving the sternum. There is extensive soft tissue destruction of the T10 vertebral body likely resulting in spinal canal narrowing. CT ABDOMEN PELVIS FINDINGS Hepatobiliary: There is a nodular surface contour of the liver. Normal gallbladder.There is no biliary ductal dilation. Pancreas: Normal contours without ductal dilatation. No peripancreatic fluid collection. Spleen: No splenic laceration or hematoma. Adrenals/Urinary Tract: --Adrenal glands: No adrenal hemorrhage. --Right kidney/ureter: There is a nonobstructing stone in the lower pole the right kidney. --Left kidney/ureter: There are nonobstructing stones involving the upper pole the left kidney. --Urinary bladder: Unremarkable. Stomach/Bowel: --Stomach/Duodenum: There is a large hiatal hernia. --Small bowel: No dilatation or inflammation. --Colon: There is a large soft tissue mass that appears to encompass much of the sigmoid colon. This mass measures approximately 13.2 x 4.5 cm. It does not appear to cause an obstruction at this time. --Appendix: Not visualized. No right lower quadrant inflammation or free fluid. Vascular/Lymphatic: Normal course and caliber of the major abdominal vessels. --there are few mildly prominent retroperitoneal lymph nodes. --there are  enlarged mesenteric lymph nodes. --there is a soft tissue mass in the right hemipelvis measuring 1.1 cm which may represent a pathologically enlarged lymph node or metastatic deposit. Reproductive: There is a probable fibroid uterus. The ovaries are not clearly defined. Other: There are innumerable peritoneal/omental implants throughout the abdomen. There is a large amorphous soft tissue mass in the left hemipelvis closely associated with the patient's sigmoid colon. There is a trace amount of free fluid in the patient's abdomen and pelvis. Musculoskeletal. 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 There may be a pathologic fracture through the L2 vertebral body with minimal height loss. There is a prominent Schmorl's node involving the inferior endplate of the L4 vertebral body. There is metastatic disease involving the pelvic bones. No definite displaced fracture through the greater trochanter of the right. IMPRESSION: CT CHEST, ABDOMEN, AND PELVIS: 1. There are pathologic fractures involving the T8 through T11 vertebral bodies, most severe at the T8 level. There is no significant retropulsion. 2. Extensive osseous metastatic disease is noted involving the visualized osseous structures as detailed above. There is a large soft tissue component at the T10 level likely resulting in some degree of spinal canal narrowing. 3. No large centrally located pulmonary embolus. There are few questionable filling defects at the segmental subsegmental levels that are not well evaluated on this exam secondary to technique. If there is clinical concern for pulmonary embolus, consider a follow-up PE study as clinically indicated. If there are pulmonary emboli, the overall volume is likely low. 4. New small right-sided pleural effusion. Bilateral atelectasis is noted. 5. Extensive soft tissue nodules are noted throughout the peritoneal cavity/omentum, consistent with widely metastatic disease. 6. Large  amorphous soft tissue mass in  the patient's left hemipelvis, closely associated with the sigmoid colon. 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 A second primary lesion seems less likely but is not entirely excluded. There is no evidence for obstruction at this time. 7. No convincing evidence for a fracture through the greater trochanter of the right hip. 8. Additional chronic findings as detailed above. CT LUMBAR SPINE: 1. There is a pathologic fracture of the L2 vertebral body with minimal height loss and minimal retropulsion. 2. 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 Electronically Signed   By: Constance Holster M.D.   On: 02/20/2019 18:44   Dg Hips Bilat W Or Wo Pelvis 3-4 Views  Addendum Date: 02/20/2019   ADDENDUM REPORT: 02/20/2019 15:36 ADDENDUM: Scattered heterogeneous appearance of the osseous structures is likely related to the patient's known widely metastatic breast carcinoma. Electronically Signed   By: Constance Holster M.D.   On: 02/20/2019 15:36   Result Date: 02/20/2019 CLINICAL DATA:  Pain status post fall EXAM: DG HIP (WITH OR WITHOUT PELVIS) 3-4V BILAT COMPARISON:  None. FINDINGS: There is no acute displaced fracture or dislocation. There are advanced degenerative changes of both hips, right worse than left. There is a lucency through the greater trochanter of the right hip. IMPRESSION: 1. No definite acute displaced fracture or dislocation. 2. Lucency through the greater trochanter of the right hip, only visualized on a single view. This could represent a nondisplaced fracture or artifact. Correlation with point tenderness is recommended. 3. Osteoarthritis of both hips. Electronically Signed: By: Constance Holster M.D. On: 02/20/2019 15:25     The results of significant diagnostics from this hospitalization (including imaging, microbiology, ancillary and laboratory) are listed below for  reference.     Microbiology: Recent Results (from the past 240 hour(s))  Urine culture     Status: Abnormal   Collection Time: 02/20/19  2:21 PM   Specimen: Urine, Random  Result Value Ref Range Status   Specimen Description URINE, RANDOM  Final   Special Requests   Final    NONE Performed at [facility], [address] [place], [place] [address]    Culture 80,000 COLONIES/mL ESCHERICHIA COLI (A)  Final   Report Status 02/22/2019 FINAL  Final   Organism ID, Bacteria ESCHERICHIA COLI (A)  Final      Susceptibility   Escherichia coli - MIC*    AMPICILLIN <=2 SENSITIVE Sensitive     CEFAZOLIN <=4 SENSITIVE Sensitive     CEFTRIAXONE <=1 SENSITIVE Sensitive     CIPROFLOXACIN <=0.25 SENSITIVE Sensitive     GENTAMICIN <=1 SENSITIVE Sensitive     IMIPENEM <=0.25 SENSITIVE Sensitive     NITROFURANTOIN <=16 SENSITIVE Sensitive     TRIMETH/SULFA <=20 SENSITIVE Sensitive     AMPICILLIN/SULBACTAM <=2 SENSITIVE Sensitive     PIP/TAZO <=4 SENSITIVE Sensitive     Extended ESBL NEGATIVE Sensitive     * 80,000 COLONIES/mL ESCHERICHIA COLI  SARS Coronavirus 2 [facility] order, Performed in [facility] hospital lab) Nasopharyngeal Nasopharyngeal Swab     Status: None   Collection Time: 02/20/19  9:44 PM   Specimen: Nasopharyngeal Swab  Result Value Ref Range Status   SARS Coronavirus 2 NEGATIVE NEGATIVE Final    Comment: (NOTE) If result is NEGATIVE SARS-CoV-2 target nucleic acids are NOT DETECTED. The SARS-CoV-2 RNA is generally detectable in upper and lower  respiratory specimens during the acute phase of infection. The lowest  concentration of SARS-CoV-2  viral copies this assay can detect is 250  copies / mL. A negative result does not preclude SARS-CoV-2 infection  and should not be used as the sole basis for treatment or other  patient management decisions.  A negative result may occur with  improper specimen collection / handling, submission of specimen other  than  nasopharyngeal swab, presence of viral mutation(s) within the  areas targeted by this assay, and inadequate number of viral copies  (<250 copies / mL). A negative result must be combined with clinical  observations, patient history, and epidemiological information. If result is POSITIVE SARS-CoV-2 target nucleic acids are DETECTED. The SARS-CoV-2 RNA is generally detectable in upper and lower  respiratory specimens dur ing the acute phase of infection.  Positive  results are indicative of active infection with SARS-CoV-2.  Clinical  correlation with patient history and other diagnostic information is  necessary to determine patient infection status.  Positive results do  not rule out bacterial infection or co-infection with other viruses. If result is PRESUMPTIVE POSTIVE SARS-CoV-2 nucleic acids MAY BE PRESENT.   A presumptive positive result was obtained on the submitted specimen  and confirmed on repeat testing.  While 2019 novel coronavirus  (SARS-CoV-2) nucleic acids may be present in the submitted sample  additional confirmatory testing may be necessary for epidemiological  and / or clinical management purposes  to differentiate between  SARS-CoV-2 and other Sarbecovirus currently known to infect humans.  If clinically indicated additional testing with an alternate test  methodology [phone]) is advised. The SARS-CoV-2 RNA is generally  detectable in upper and lower respiratory sp ecimens during the acute  phase of infection. The expected result is Negative. Fact Sheet for Patients:  StrictlyIdeas.no Fact Sheet for Healthcare Providers: BankingDealers.co.za This test is not yet approved or cleared by the [place] FDA and has been authorized for detection and/or diagnosis of SARS-CoV-2 by FDA under an Emergency Use Authorization (EUA).  This EUA will remain in effect (meaning this test can be used) for the duration of  the COVID-19 declaration under Section 564(b)(1) of the Act, 21 U.S.C. section 360bbb-3(b)(1), unless the authorization is terminated or revoked sooner. Performed at [facility], [place] [address], [place], [place] [address]      Labs: BNP (last 3 results) No results for input(s): BNP in the last 8760 hours. Basic Metabolic Panel: Recent Labs  Lab 02/20/19 1311 02/21/19 0229 02/22/19 0348  NA 141 142 137  K 3.4* 3.4* 4.0  CL 98 102 99  CO2 30 30 28   GLUCOSE 147* 157* 160*  BUN 22 19 16   CREATININE 0.92 0.87 0.81  CALCIUM 11.5* 10.7* 10.0  MG  --  1.9  --    Liver Function Tests: Recent Labs  Lab 02/20/19 1311 02/22/19 0348  AST 44* 50*  ALT 28 27  ALKPHOS 136* 123  BILITOT 1.1 1.2  PROT 5.8* 4.7*  ALBUMIN 2.9* 2.1*   No results for input(s): LIPASE, AMYLASE in the last 168 hours. No results for input(s): AMMONIA in the last 168 hours. CBC: Recent Labs  Lab 02/20/19 1311 02/21/19 0229 02/22/19 0348  WBC 3.3* 3.0* 2.4*  NEUTROABS 3.0  --   --   HGB 13.1 11.0* 10.1*  HCT 42.1 35.1* 32.3*  MCV 92.7 91.6 92.3  PLT 149* 128* 113*   Cardiac Enzymes: Recent Labs  Lab 02/20/19 1600  CKTOTAL 62   BNP: Invalid input(s): POCBNP CBG: No results for input(s): GLUCAP in the last 168  hours. D-Dimer No results for input(s): DDIMER in the last 72 hours. Hgb A1c No results for input(s): HGBA1C in the last 72 hours. Lipid Profile No results for input(s): CHOL, HDL, LDLCALC, TRIG, CHOLHDL, LDLDIRECT in the last 72 hours. Thyroid function studies No results for input(s): TSH, T4TOTAL, T3FREE, THYROIDAB in the last 72 hours.  Invalid input(s): FREET3 Anemia work up No results for input(s): VITAMINB12, FOLATE, FERRITIN, TIBC, IRON, RETICCTPCT in the last 72 hours. Urinalysis    Component Value Date/Time   COLORURINE YELLOW 02/20/2019 1732   APPEARANCEUR CLEAR 02/20/2019 1732   LABSPEC 1.016 02/20/2019 1732   LABSPEC 1.005 01/31/2014 1311   PHURINE 6.0  02/20/2019 1732   GLUCOSEU NEGATIVE 02/20/2019 1732   GLUCOSEU Negative 01/28/2014 1311   HGBUR NEGATIVE 02/20/2019 1732   BILIRUBINUR NEGATIVE 02/20/2019 1732   BILIRUBINUR Negative 02/12/2014 1311   KETONESUR 5 (A) 02/20/2019 1732   PROTEINUR NEGATIVE 02/20/2019 1732   UROBILINOGEN 0.2 10/19/2014 0429   UROBILINOGEN 0.2 02/11/2014 1311   NITRITE NEGATIVE 02/20/2019 1732   LEUKOCYTESUR NEGATIVE 02/20/2019 1732   LEUKOCYTESUR Moderate 02/18/2014 1311   Sepsis Labs Invalid input(s): PROCALCITONIN,  WBC,  LACTICIDVEN Microbiology Recent Results (from the past 240 hour(s))  Urine culture     Status: Abnormal   Collection Time: 02/20/19  2:21 PM   Specimen: Urine, Random  Result Value Ref Range Status   Specimen Description URINE, RANDOM  Final   Special Requests   Final    NONE Performed at [facility], [place] [address] [place], [place] [address]    Culture 80,000 COLONIES/mL ESCHERICHIA COLI (A)  Final   Report Status 02/22/2019 FINAL  Final   Organism ID, Bacteria ESCHERICHIA COLI (A)  Final      Susceptibility   Escherichia coli - MIC*    AMPICILLIN <=2 SENSITIVE Sensitive     CEFAZOLIN <=4 SENSITIVE Sensitive     CEFTRIAXONE <=1 SENSITIVE Sensitive     CIPROFLOXACIN <=0.25 SENSITIVE Sensitive     GENTAMICIN <=1 SENSITIVE Sensitive     IMIPENEM <=0.25 SENSITIVE Sensitive     NITROFURANTOIN <=16 SENSITIVE Sensitive     TRIMETH/SULFA <=20 SENSITIVE Sensitive     AMPICILLIN/SULBACTAM <=2 SENSITIVE Sensitive     PIP/TAZO <=4 SENSITIVE Sensitive     Extended ESBL NEGATIVE Sensitive     * 80,000 COLONIES/mL ESCHERICHIA COLI  SARS Coronavirus 2 [facility] order, Performed in [facility] hospital lab) Nasopharyngeal Nasopharyngeal Swab     Status: None   Collection Time: 02/20/19  9:44 PM   Specimen: Nasopharyngeal Swab  Result Value Ref Range Status   SARS Coronavirus 2 NEGATIVE NEGATIVE Final    Comment: (NOTE) If result is NEGATIVE SARS-CoV-2 target nucleic acids  are NOT DETECTED. The SARS-CoV-2 RNA is generally detectable in upper and lower  respiratory specimens during the acute phase of infection. The lowest  concentration of SARS-CoV-2 viral copies this assay can detect is 250  copies / mL. A negative result does not preclude SARS-CoV-2 infection  and should not be used as the sole basis for treatment or other  patient management decisions.  A negative result may occur with  improper specimen collection / handling, submission of specimen other  than nasopharyngeal swab, presence of viral mutation(s) within the  areas targeted by this assay, and inadequate number of viral copies  (<250 copies / mL). A negative result must be combined with clinical  observations, patient history, and epidemiological information. If result is POSITIVE SARS-CoV-2 target nucleic  acids are DETECTED. The SARS-CoV-2 RNA is generally detectable in upper and lower  respiratory specimens dur ing the acute phase of infection.  Positive  results are indicative of active infection with SARS-CoV-2.  Clinical  correlation with patient history and other diagnostic information is  necessary to determine patient infection status.  Positive results do  not rule out bacterial infection or co-infection with other viruses. If result is PRESUMPTIVE POSTIVE SARS-CoV-2 nucleic acids MAY BE PRESENT.   A presumptive positive result was obtained on the submitted specimen  and confirmed on repeat testing.  While 2019 novel coronavirus  (SARS-CoV-2) nucleic acids may be present in the submitted sample  additional confirmatory testing may be necessary for epidemiological  and / or clinical management purposes  to differentiate between  SARS-CoV-2 and other Sarbecovirus currently known to infect humans.  If clinically indicated additional testing with an alternate test  methodology [phone]) is advised. The SARS-CoV-2 RNA is generally  detectable in upper and lower respiratory sp ecimens  during the acute  phase of infection. The expected result is Negative. Fact Sheet for Patients:  StrictlyIdeas.no Fact Sheet for Healthcare Providers: BankingDealers.co.za This test is not yet approved or cleared by the [place] FDA and has been authorized for detection and/or diagnosis of SARS-CoV-2 by FDA under an Emergency Use Authorization (EUA).  This EUA will remain in effect (meaning this test can be used) for the duration of the COVID-19 declaration under Section 564(b)(1) of the Act, 21 U.S.C. section 360bbb-3(b)(1), unless the authorization is terminated or revoked sooner. Performed at [facility], [place] [address], [place], [place] [address]      Time coordinating discharge:  I have spent 35 minutes face to face with the patient and on the ward discussing the patients care, assessment, plan and disposition with other care givers. >50% of the time was devoted counseling the patient about the risks and benefits of treatment/Discharge disposition and coordinating care.   SIGNED:   Damita Lack, MD  [facility] 02/25/2019, 10:25 AM   If 7PM-7AM, please contact night-coverage [email or web address]

## 2019-02-25 NOTE — Progress Notes (Signed)
PROGRESS NOTE    [name]  [id number] DOB: 03-Sep-1940 DOA: 02/20/2019 PCP: Kelton Pillar, MD   Brief Narrative:  78 year old with metastatic breast cancer, extensive bony metastases status post radiation treatment, PE/DVT on Coumadin, fibromyalgia came to the hospital after sustaining mechanical fall.  Multiple fractures noted.  Work-up showed extensive lytic lesions throughout vertebrae.  Seen by oncology recommending hospice and pain control at this time.  Pain medications increased due to severe pain.   Assessment & Plan:   Principal Problem:   Fall Active Problems:   Chronic anemia   Pulmonary embolism (HCC)   DVT of lower extremity, bilateral (HCC)   Essential hypertension   Carcinoma of right breast metastatic to multiple sites [facility])   Hypercalcemia   Hypokalemia   Supratherapeutic INR   Left rib fracture   Vertebral fracture, pathological   Rib fractures   Acute pain secondary to extensive bony metastases/pathologic vertebral fractures T8-T11, L2 Multiple rib fractures Metastatic breast cancer - Aggressive metastatic cancer.  Seen by oncology-no further options for chemo or radiation.  Not a good surgical candidate -Aggressive incentive spirometer use recommended. - We will transition patient to hospice when bed available -Pain medications including liquid morphine. -Bowel regimen including suppository. -Changed her diet to liquid.  Feed as tolerated.  Advised comfort feeding and use basic aspiration precaution  Urinary tract infection with E. coli -Completed 3-day course of Keflex  Hypercalcemia - Improved  Coagulopathy with supratherapeutic INR, initially greater than 10 - Discontinue anticoagulation per family and patient request  History of PE/DVT -Discontinue anticoagulation per patient and family request  Overall she has poor prognosis  DVT prophylaxis: None Code Status: DNR Disposition Plan: Awaiting bed at hospice    Consultants:    Oncology  Procedures:   None  Antimicrobials:   None   Subjective: Reports of pain with movement otherwise no complaints.  I offered to increase her pain medications but she states she is okay with current pain regimen.  Review of Systems Otherwise negative except as per HPI, including: General = no fevers, chills, dizziness, malaise, fatigue HEENT/EYES = negative for pain, redness, loss of vision, double vision, blurred vision, loss of hearing, sore throat, hoarseness, dysphagia Cardiovascular= negative for chest pain, palpitation, murmurs, lower extremity swelling Respiratory/lungs= negative for shortness of breath, cough, hemoptysis, wheezing, mucus production Gastrointestinal= negative for nausea, vomiting,, abdominal pain, melena, hematemesis Genitourinary= negative for Dysuria, Hematuria, Change in Urinary Frequency MSK = Negative for arthralgia, myalgias, Back Pain, Joint swelling  Neurology= Negative for headache, seizures, numbness, tingling  Psychiatry= Negative for anxiety, depression, suicidal and homocidal ideation Allergy/Immunology= Medication/Food allergy as listed  Skin= Negative for Rash, lesions, ulcers, itching   Objective: Vitals:   02/24/19 1427 02/24/19 1453 02/24/19 2036 02/25/19 0501  BP: 130/78  129/89 126/82  Pulse: (!) 116  (!) 111 (!) 110  Resp:  20 20 16   Temp: 98.9 F (37.2 C)  98.9 F (37.2 C) 97.9 F (36.6 C)  TempSrc: Oral  Oral Oral  SpO2: (!) 89% 95% 97% 97%  Weight:      Height:        Intake/Output Summary (Last 24 hours) at 02/25/2019 0910 Last data filed at 02/24/2019 1700 Gross per 24 hour  Intake 360 ml  Output -  Net 360 ml   Filed Weights   02/20/19 2227  Weight: 74.1 kg    Examination:  Constitutional: Chronically ill and frail appearing Eyes: PERRL, lids and conjunctivae normal ENMT: Mucous membranes are dry mouth  posterior pharynx clear of any exudate or lesions.Normal dentition.  Neck: normal, supple, no  masses, no thyromegaly Respiratory: clear to auscultation bilaterally, no wheezing, no crackles. Normal respiratory effort. No accessory muscle use.  Cardiovascular: Regular rate and rhythm, no murmurs / rubs / gallops. No extremity edema. 2+ pedal pulses. No carotid bruits.  Abdomen: no tenderness, no masses palpated. No hepatosplenomegaly. Bowel sounds positive.  Musculoskeletal: no clubbing / cyanosis. No joint deformity upper and lower extremities. Good ROM, no contractures. Normal muscle tone.  Skin: no rashes, lesions, ulcers. No induration Neurologic: CN 2-12 grossly intact. Sensation intact, DTR normal. Strength 4/5 in all 4.  Psychiatric: Normal judgment and insight. Alert and oriented x 3. Normal mood.    Data Reviewed:   CBC: Recent Labs  Lab 02/20/19 1311 02/21/19 0229 02/22/19 0348  WBC 3.3* 3.0* 2.4*  NEUTROABS 3.0  --   --   HGB 13.1 11.0* 10.1*  HCT 42.1 35.1* 32.3*  MCV 92.7 91.6 92.3  PLT 149* 128* 123456*   Basic Metabolic Panel: Recent Labs  Lab 02/20/19 1311 02/21/19 0229 02/22/19 0348  NA 141 142 137  K 3.4* 3.4* 4.0  CL 98 102 99  CO2 30 30 28   GLUCOSE 147* 157* 160*  BUN 22 19 16   CREATININE 0.92 0.87 0.81  CALCIUM 11.5* 10.7* 10.0  MG  --  1.9  --    GFR: Estimated Creatinine Clearance: 56.1 mL/min (by C-G formula based on SCr of 0.81 mg/dL). Liver Function Tests: Recent Labs  Lab 02/20/19 1311 02/22/19 0348  AST 44* 50*  ALT 28 27  ALKPHOS 136* 123  BILITOT 1.1 1.2  PROT 5.8* 4.7*  ALBUMIN 2.9* 2.1*   No results for input(s): LIPASE, AMYLASE in the last 168 hours. No results for input(s): AMMONIA in the last 168 hours. Coagulation Profile: Recent Labs  Lab 02/21/19 0229 02/22/19 0348 02/23/19 0326 02/24/19 0347 02/25/19 0403  INR >10.0* 1.1 1.0 1.0 1.0   Cardiac Enzymes: Recent Labs  Lab 02/20/19 1600  CKTOTAL 62   BNP (last 3 results) No results for input(s): PROBNP in the last 8760 hours. HbA1C: No results for  input(s): HGBA1C in the last 72 hours. CBG: No results for input(s): GLUCAP in the last 168 hours. Lipid Profile: No results for input(s): CHOL, HDL, LDLCALC, TRIG, CHOLHDL, LDLDIRECT in the last 72 hours. Thyroid Function Tests: No results for input(s): TSH, T4TOTAL, FREET4, T3FREE, THYROIDAB in the last 72 hours. Anemia Panel: No results for input(s): VITAMINB12, FOLATE, FERRITIN, TIBC, IRON, RETICCTPCT in the last 72 hours. Sepsis Labs: No results for input(s): PROCALCITON, LATICACIDVEN in the last 168 hours.  Recent Results (from the past 240 hour(s))  Urine culture     Status: Abnormal   Collection Time: 02/20/19  2:21 PM   Specimen: Urine, Random  Result Value Ref Range Status   Specimen Description URINE, RANDOM  Final   Special Requests   Final    NONE Performed at [facility], [address], [place], [place] [address]    Culture 80,000 COLONIES/mL ESCHERICHIA COLI (A)  Final   Report Status 02/22/2019 FINAL  Final   Organism ID, Bacteria ESCHERICHIA COLI (A)  Final      Susceptibility   Escherichia coli - MIC*    AMPICILLIN <=2 SENSITIVE Sensitive     CEFAZOLIN <=4 SENSITIVE Sensitive     CEFTRIAXONE <=1 SENSITIVE Sensitive     CIPROFLOXACIN <=0.25 SENSITIVE Sensitive     GENTAMICIN <=1 SENSITIVE Sensitive  IMIPENEM <=0.25 SENSITIVE Sensitive     NITROFURANTOIN <=16 SENSITIVE Sensitive     TRIMETH/SULFA <=20 SENSITIVE Sensitive     AMPICILLIN/SULBACTAM <=2 SENSITIVE Sensitive     PIP/TAZO <=4 SENSITIVE Sensitive     Extended ESBL NEGATIVE Sensitive     * 80,000 COLONIES/mL ESCHERICHIA COLI  SARS Coronavirus 2 [facility] order, Performed in [facility] hospital lab) Nasopharyngeal Nasopharyngeal Swab     Status: None   Collection Time: 02/20/19  9:44 PM   Specimen: Nasopharyngeal Swab  Result Value Ref Range Status   SARS Coronavirus 2 NEGATIVE NEGATIVE Final    Comment: (NOTE) If result is NEGATIVE SARS-CoV-2 target nucleic acids are NOT DETECTED. The  SARS-CoV-2 RNA is generally detectable in upper and lower  respiratory specimens during the acute phase of infection. The lowest  concentration of SARS-CoV-2 viral copies this assay can detect is 250  copies / mL. A negative result does not preclude SARS-CoV-2 infection  and should not be used as the sole basis for treatment or other  patient management decisions.  A negative result may occur with  improper specimen collection / handling, submission of specimen other  than nasopharyngeal swab, presence of viral mutation(s) within the  areas targeted by this assay, and inadequate number of viral copies  (<250 copies / mL). A negative result must be combined with clinical  observations, patient history, and epidemiological information. If result is POSITIVE SARS-CoV-2 target nucleic acids are DETECTED. The SARS-CoV-2 RNA is generally detectable in upper and lower  respiratory specimens dur ing the acute phase of infection.  Positive  results are indicative of active infection with SARS-CoV-2.  Clinical  correlation with patient history and other diagnostic information is  necessary to determine patient infection status.  Positive results do  not rule out bacterial infection or co-infection with other viruses. If result is PRESUMPTIVE POSTIVE SARS-CoV-2 nucleic acids MAY BE PRESENT.   A presumptive positive result was obtained on the submitted specimen  and confirmed on repeat testing.  While 2019 novel coronavirus  (SARS-CoV-2) nucleic acids may be present in the submitted sample  additional confirmatory testing may be necessary for epidemiological  and / or clinical management purposes  to differentiate between  SARS-CoV-2 and other Sarbecovirus currently known to infect humans.  If clinically indicated additional testing with an alternate test  methodology [phone]) is advised. The SARS-CoV-2 RNA is generally  detectable in upper and lower respiratory sp ecimens during the acute   phase of infection. The expected result is Negative. Fact Sheet for Patients:  StrictlyIdeas.no Fact Sheet for Healthcare Providers: BankingDealers.co.za This test is not yet approved or cleared by the [place] FDA and has been authorized for detection and/or diagnosis of SARS-CoV-2 by FDA under an Emergency Use Authorization (EUA).  This EUA will remain in effect (meaning this test can be used) for the duration of the COVID-19 declaration under Section 564(b)(1) of the Act, 21 U.S.C. section 360bbb-3(b)(1), unless the authorization is terminated or revoked sooner. Performed at [facility], [place] [address], [place], [place] [address]          Radiology Studies: No results found.      Scheduled Meds: . fentaNYL  1 patch Transdermal Q72H  .  morphine injection  1 mg Intravenous Once   Continuous Infusions: . sodium chloride       LOS: 4 days   Time spent= 15 mins    Ankit Arsenio Loader, MD [facility]  If 7PM-7AM, please contact  night-coverage [email or web address] 02/25/2019, 9:10 AM

## 2019-02-25 NOTE — TOC Transition Note (Addendum)
Transition of Care [facility]) - CM/SW Discharge Note   Patient Details  Name: [name] MRN: [id number] Date of Birth: 02/04/1941  Transition of Care [facility]) CM/SW Contact:  Ross Ludwig, LCSW Phone Number: 02/25/2019, 12:56 PM   Clinical Narrative:     CSW spoke to [place] at [place] [phone], and [facility] has a bed available for patient to be accepted today.  CSW notified physician who was able to complete discharge summary. [facility] requested discharge summary to be faxed to [phone], CSW to fax discharge summary once completed.  Patient to be d/c'ed today to [facility].  Patient and family agreeable to plans will transport via ems RN to call report to [phone].  [place] notified patient's son [name] [phone] that she will be discharging today.  Discharge summary faxed to [facility] place 10:40am.    Barriers to Discharge: Hospice Bed not available   Patient Goals and CMS Choice Patient states their goals for this hospitalization and ongoing recovery are:: to have less pain CMS Medicare.gov Compare Post Acute Care list provided to:: Patient Choice offered to / list presented to : Patient  Discharge Placement  [facility] for end of life care.        Discharge Plan and Services In-house Referral: Clinical Social Work Discharge Planning Services: CM Consult Post Acute Care Choice: [place]              Social Determinants of Health (SDOH) Interventions     Readmission Risk Interventions No flowsheet data found.

## 2019-02-25 NOTE — Progress Notes (Signed)
Patient being transported via [place] to [facility].  Patient was given medication in order to manage pain during transfer and transport.  RFA IV was removed and pressure applied.

## 2019-02-25 NOTE — Progress Notes (Signed)
[facility]) Hospital Liaison Registration Visit Note [phone]  [facility] will be able to offer a bed for this pt today. RN spoke with son [name] to confirm however requested Liaison to contact patient directly for paperwork completion. RN visited pt at bedside with confirmation to agree to a transfer today. CSW Randall Hiss) is aware. Also updated bedside nurse and son [name]) on pt's disposition pending transfer. Registration paperwork completed at 1139 today. Dr. Orpah Melter to assume care per pt's request per facility physician. Please fax discharge summary to [phone]. RN please call report to [phone].   Please arrange transport for patient to arrive as soon as possible.  Thank You.  Raina Mina, RN BSN [facility] Liaison [phone]   [place] are on AMION

## 2019-02-26 ENCOUNTER — Ambulatory Visit: Payer: Medicare Other

## 2019-02-27 ENCOUNTER — Ambulatory Visit: Payer: Medicare Other

## 2019-02-28 ENCOUNTER — Ambulatory Visit: Payer: Medicare Other

## 2019-03-02 LAB — PTH-RELATED PEPTIDE: PTH-related peptide: <2 pmol/L

## 2019-03-22 DEATH — deceased

## 2019-05-09 NOTE — Progress Notes (Signed)
  Radiation Oncology         [phone] ________________________________  Name: [name] MRN: [id number]  Date: 02/08/2019  DOB: Feb 11, 1941  End of Treatment Note  Diagnosis:   Metastatic breast cancer     Indication for treatment::  palliative       Radiation treatment dates:   01/29/19 - 02/08/19  Site/dose:    1.  C-spine planned to 30 Gy in 10 fractions using a 3-field isodose plan.  2.  T-spine planned to 30 Gy in 10 fractions using a 2-field isodose plan. 3.  Right pelvis planned to 28 Gy in 8 fractions using a 2-field isodose plan. 4.  C-spine planned to 28 Gy in 8 fractions using a 2-field isodose plan.  Narrative: The patient tolerated radiation treatment initially, but her status declined and XRT was discontinued. She receieved 9 fractions to the T-0spine and C-spine, and 1 fraction to the right pelvis and left pelvis.  Plan: No further followup scheduled at this time.   ________________________________  Jodelle Gross, M.D., Ph.D.
